# Patient Record
Sex: Female | Born: 1945
Health system: Southern US, Community
[De-identification: ages and names within clinical notes are randomized; demographics above are authoritative.]

## PROBLEM LIST (undated history)

## (undated) DIAGNOSIS — E782 Mixed hyperlipidemia: Secondary | ICD-10-CM

## (undated) DIAGNOSIS — F4321 Adjustment disorder with depressed mood: Principal | ICD-10-CM

## (undated) DIAGNOSIS — G629 Polyneuropathy, unspecified: Secondary | ICD-10-CM

## (undated) DIAGNOSIS — D649 Anemia, unspecified: Secondary | ICD-10-CM

## (undated) DIAGNOSIS — M1990 Unspecified osteoarthritis, unspecified site: Secondary | ICD-10-CM

## (undated) DIAGNOSIS — R591 Generalized enlarged lymph nodes: Secondary | ICD-10-CM

## (undated) DIAGNOSIS — Z0000 Encounter for general adult medical examination without abnormal findings: Secondary | ICD-10-CM

## (undated) DIAGNOSIS — Z8742 Personal history of other diseases of the female genital tract: Secondary | ICD-10-CM

## (undated) DIAGNOSIS — Z973 Presence of spectacles and contact lenses: Secondary | ICD-10-CM

## (undated) DIAGNOSIS — E559 Vitamin D deficiency, unspecified: Secondary | ICD-10-CM

## (undated) DIAGNOSIS — Z8619 Personal history of other infectious and parasitic diseases: Secondary | ICD-10-CM

## (undated) DIAGNOSIS — C8330 Diffuse large B-cell lymphoma, unspecified site: Secondary | ICD-10-CM

## (undated) HISTORY — DX: Personal history of other infectious and parasitic diseases: Z86.19

## (undated) HISTORY — PX: SKIN BIOPSY: SHX1

## (undated) HISTORY — DX: Vitamin D deficiency, unspecified: E55.9

## (undated) HISTORY — DX: Generalized enlarged lymph nodes: R59.1

## (undated) HISTORY — DX: Adjustment disorder with depressed mood: F43.21

## (undated) HISTORY — DX: Anemia, unspecified: D64.9

## (undated) HISTORY — DX: Personal history of other diseases of the female genital tract: Z87.42

## (undated) HISTORY — DX: Diffuse large B-cell lymphoma, unspecified site: C83.30

## (undated) HISTORY — DX: Mixed hyperlipidemia: E78.2

## (undated) HISTORY — DX: Polyneuropathy, unspecified: G62.9

## (undated) HISTORY — DX: Encounter for general adult medical examination without abnormal findings: Z00.00

## (undated) HISTORY — PX: COLONOSCOPY: SHX174

---

## 1998-02-26 ENCOUNTER — Other Ambulatory Visit: Admission: RE | Admit: 1998-02-26 | Discharge: 1998-02-26 | Payer: Self-pay | Admitting: *Deleted

## 1999-03-23 ENCOUNTER — Other Ambulatory Visit: Admission: RE | Admit: 1999-03-23 | Discharge: 1999-03-23 | Payer: Self-pay | Admitting: *Deleted

## 2000-03-30 ENCOUNTER — Other Ambulatory Visit: Admission: RE | Admit: 2000-03-30 | Discharge: 2000-03-30 | Payer: Self-pay | Admitting: *Deleted

## 2001-03-27 ENCOUNTER — Other Ambulatory Visit: Admission: RE | Admit: 2001-03-27 | Discharge: 2001-03-27 | Payer: Self-pay | Admitting: *Deleted

## 2002-04-05 ENCOUNTER — Other Ambulatory Visit: Admission: RE | Admit: 2002-04-05 | Discharge: 2002-04-05 | Payer: Self-pay | Admitting: *Deleted

## 2003-04-08 ENCOUNTER — Other Ambulatory Visit: Admission: RE | Admit: 2003-04-08 | Discharge: 2003-04-08 | Payer: Self-pay | Admitting: *Deleted

## 2004-04-08 ENCOUNTER — Other Ambulatory Visit: Admission: RE | Admit: 2004-04-08 | Discharge: 2004-04-08 | Payer: Self-pay | Admitting: *Deleted

## 2005-01-12 ENCOUNTER — Other Ambulatory Visit: Admission: RE | Admit: 2005-01-12 | Discharge: 2005-01-12 | Payer: Self-pay | Admitting: *Deleted

## 2011-03-15 LAB — HM COLONOSCOPY

## 2011-06-28 DIAGNOSIS — Z1231 Encounter for screening mammogram for malignant neoplasm of breast: Secondary | ICD-10-CM | POA: Diagnosis not present

## 2011-08-06 DIAGNOSIS — G479 Sleep disorder, unspecified: Secondary | ICD-10-CM | POA: Diagnosis not present

## 2011-08-06 DIAGNOSIS — F411 Generalized anxiety disorder: Secondary | ICD-10-CM | POA: Diagnosis not present

## 2011-08-19 DIAGNOSIS — G479 Sleep disorder, unspecified: Secondary | ICD-10-CM | POA: Diagnosis not present

## 2011-08-19 DIAGNOSIS — F411 Generalized anxiety disorder: Secondary | ICD-10-CM | POA: Diagnosis not present

## 2011-09-22 DIAGNOSIS — D239 Other benign neoplasm of skin, unspecified: Secondary | ICD-10-CM | POA: Diagnosis not present

## 2011-09-22 DIAGNOSIS — L280 Lichen simplex chronicus: Secondary | ICD-10-CM | POA: Diagnosis not present

## 2011-12-10 DIAGNOSIS — H40029 Open angle with borderline findings, high risk, unspecified eye: Secondary | ICD-10-CM | POA: Diagnosis not present

## 2011-12-10 DIAGNOSIS — H1045 Other chronic allergic conjunctivitis: Secondary | ICD-10-CM | POA: Diagnosis not present

## 2011-12-10 DIAGNOSIS — H2510 Age-related nuclear cataract, unspecified eye: Secondary | ICD-10-CM | POA: Diagnosis not present

## 2012-01-27 ENCOUNTER — Telehealth: Payer: Self-pay

## 2012-01-27 NOTE — Telephone Encounter (Signed)
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

## 2012-01-27 NOTE — Telephone Encounter (Signed)
DR DOOLITTLE   PT WOULD LIKE YOU TO ACCEPT HER AS A NEW MEDICARE PATIENT, SHE HAS NOT BEEN SEEN IN OUR OFFICE SINCE 2000.    PT PHONE [phone]

## 2012-02-01 NOTE — Telephone Encounter (Signed)
Pt needs CPE (Welcome to [facility]) by October. Scheduled CPE with Dr. Clelia Croft due to time constrants and 6 month f-up with Dr. Merla Riches in April.

## 2012-02-01 NOTE — Telephone Encounter (Signed)
Just FYI. Shakeelah had to have her Welcome to [facility] this month so I scheduled that with Dr. Clelia Croft and also scheduled a 6 month f-up OV with you in April. She wants to see you as her primary but understands we could not get the CPE done in time for this year.

## 2012-02-01 NOTE — Telephone Encounter (Signed)
Pt approved for appts by Dr. Merla Riches. Left msg for pt to call to schedule appt with him.

## 2012-02-15 DIAGNOSIS — J069 Acute upper respiratory infection, unspecified: Secondary | ICD-10-CM | POA: Diagnosis not present

## 2012-02-15 DIAGNOSIS — J029 Acute pharyngitis, unspecified: Secondary | ICD-10-CM | POA: Diagnosis not present

## 2012-02-17 DIAGNOSIS — Z01419 Encounter for gynecological examination (general) (routine) without abnormal findings: Secondary | ICD-10-CM | POA: Diagnosis not present

## 2012-02-17 DIAGNOSIS — Z1151 Encounter for screening for human papillomavirus (HPV): Secondary | ICD-10-CM | POA: Diagnosis not present

## 2012-02-17 DIAGNOSIS — R87610 Atypical squamous cells of undetermined significance on cytologic smear of cervix (ASC-US): Secondary | ICD-10-CM | POA: Diagnosis not present

## 2012-02-17 DIAGNOSIS — Z9189 Other specified personal risk factors, not elsewhere classified: Secondary | ICD-10-CM | POA: Diagnosis not present

## 2012-02-25 ENCOUNTER — Encounter: Payer: Self-pay | Admitting: Family Medicine

## 2012-02-29 DIAGNOSIS — Z23 Encounter for immunization: Secondary | ICD-10-CM | POA: Diagnosis not present

## 2012-03-15 ENCOUNTER — Ambulatory Visit (INDEPENDENT_AMBULATORY_CARE_PROVIDER_SITE_OTHER): Payer: Medicare Other | Admitting: Internal Medicine

## 2012-03-15 ENCOUNTER — Encounter: Payer: Self-pay | Admitting: Internal Medicine

## 2012-03-15 VITALS — BP 119/73 | HR 58 | Temp 98.1°F | Resp 18 | Ht 68.5 in | Wt 170.8 lb

## 2012-03-15 DIAGNOSIS — E7800 Pure hypercholesterolemia, unspecified: Secondary | ICD-10-CM | POA: Diagnosis not present

## 2012-03-15 DIAGNOSIS — Z0000 Encounter for general adult medical examination without abnormal findings: Secondary | ICD-10-CM

## 2012-03-15 DIAGNOSIS — Z13220 Encounter for screening for lipoid disorders: Secondary | ICD-10-CM

## 2012-03-15 LAB — POCT URINALYSIS DIPSTICK
Bilirubin, UA: NEGATIVE
Glucose, UA: NEGATIVE
Ketones, UA: NEGATIVE
Leukocytes, UA: NEGATIVE
Nitrite, UA: NEGATIVE
Protein, UA: NEGATIVE
Spec Grav, UA: 1.02
Urobilinogen, UA: 0.2
pH, UA: 6.5

## 2012-03-15 LAB — LIPID PANEL
Cholesterol: 219 mg/dL — ABNORMAL HIGH (ref 0–200)
HDL: 71 mg/dL (ref >39)
LDL Cholesterol: 133 mg/dL — ABNORMAL HIGH (ref 0–99)
Total CHOL/HDL Ratio: 3.1 Ratio
Triglycerides: 74 mg/dL (ref <150)
VLDL: 15 mg/dL (ref 0–40)

## 2012-03-15 LAB — COMPREHENSIVE METABOLIC PANEL
ALT: 19 U/L (ref 0–35)
AST: 17 U/L (ref 0–37)
Albumin: 4.5 g/dL (ref 3.5–5.2)
Alkaline Phosphatase: 80 U/L (ref 39–117)
BUN: 14 mg/dL (ref 6–23)
CO2: 28 mEq/L (ref 19–32)
Calcium: 9.6 mg/dL (ref 8.4–10.5)
Chloride: 105 mEq/L (ref 96–112)
Creat: 0.7 mg/dL (ref 0.50–1.10)
Glucose, Bld: 93 mg/dL (ref 70–99)
Potassium: 4.4 mEq/L (ref 3.5–5.3)
Sodium: 139 mEq/L (ref 135–145)
Total Bilirubin: 0.6 mg/dL (ref 0.3–1.2)
Total Protein: 6.8 g/dL (ref 6.0–8.3)

## 2012-03-15 NOTE — Progress Notes (Signed)
  Subjective:    [name], female    DOB: 06-25-1945, 66 y.o.   MRN: [id number]  HPIWelcome to Medicare Long history of good health with no problems and extreme longevity and family. Most pressing problems involve living with spouse diagnosed in 2010 with glioblastoma multiforme Not treated in a protocol study at [facility] and has far exceeded his expected lifetime. He does have a number of disabling features and requires a considerable amount of care, the bulk of which falls to our patient. This creates a lot of anxiety in her head also considerable guilt and she can accomplish all that he needs. This is resulting in a great cutback in work and personal time for her which also takes an emotional toll.  Review of medical and social history Reveals no chronic illnesses, no prior surgeries, no significant family medical history, no significant past medical history.No personal risk behaviors. Good daily activity. Works as a Management consultant. Is up-to-date on Pap smear and mammogram through [facility]. Is up-to-date on colonoscopy as of November 2012 with Dr. Cristina Gong.  She completed a screening inventory for depression which was negative. She has no other mood disorders. Sleep is good. Her functional ability and level of safety is excellent. She is not ready to commit to end-of-life planning  Flu shot one month ago. Pneumococcal vaccine one year ago. Review of Systems 14 point review of systems negative. She is menopausal.   Last Pap smear was one month ago and included fecal occult blood screening. Last DEXA 2010 was normal. Recent weight gain of 10 pounds due to sharing cooking desserts and needing a desserts with spouse Objective:   Physical Exam Filed Vitals:   03/15/12 0950  BP: 119/73  Pulse: 58  Temp: 98.1 F (36.7 C)  Resp: 18  wt 170 No thyromegaly or lymphadenopathy vision intact Able to read small print at regular distance with some blurriness of far  vision/she has glasses in her car Heart regular without murmurs rubs or gallops/no carotid bruits Neurological is intact Psychological is stable        Assessment & Plan:  Discussed weight loss through dietary modification and exercise increases There was one elevated LDL value in 2007 which will be repeated today  She  Is in excellent health/she will consider counseling for dealing with her difficult role of the caretaker Followup 1 year

## 2012-03-16 LAB — CBC WITH DIFFERENTIAL/PLATELET
Basophils Absolute: 0 10*3/uL (ref 0.0–0.1)
Basophils Relative: 1 % (ref 0–1)
Eosinophils Absolute: 0.1 10*3/uL (ref 0.0–0.7)
Eosinophils Relative: 4 % (ref 0–5)
HCT: 40.1 % (ref 36.0–46.0)
Hemoglobin: 14 g/dL (ref 12.0–15.0)
Lymphocytes Relative: 35 % (ref 12–46)
Lymphs Abs: 1.4 10*3/uL (ref 0.7–4.0)
MCH: 30.2 pg (ref 26.0–34.0)
MCHC: 34.9 g/dL (ref 30.0–36.0)
MCV: 86.4 fL (ref 78.0–100.0)
Monocytes Absolute: 0.4 10*3/uL (ref 0.1–1.0)
Monocytes Relative: 9 % (ref 3–12)
Neutro Abs: 2.1 10*3/uL (ref 1.7–7.7)
Neutrophils Relative %: 51 % (ref 43–77)
Platelets: 287 10*3/uL (ref 150–400)
RBC: 4.64 MIL/uL (ref 3.87–5.11)
RDW: 13.7 % (ref 11.5–15.5)
WBC: 4 10*3/uL (ref 4.0–10.5)

## 2012-03-20 ENCOUNTER — Encounter: Payer: Self-pay | Admitting: Internal Medicine

## 2012-07-17 DIAGNOSIS — Z1231 Encounter for screening mammogram for malignant neoplasm of breast: Secondary | ICD-10-CM | POA: Diagnosis not present

## 2012-08-16 ENCOUNTER — Encounter: Payer: Self-pay | Admitting: Internal Medicine

## 2012-08-16 ENCOUNTER — Ambulatory Visit (INDEPENDENT_AMBULATORY_CARE_PROVIDER_SITE_OTHER): Payer: Medicare Other | Admitting: Internal Medicine

## 2012-08-16 VITALS — BP 130/90 | HR 61 | Temp 98.4°F | Resp 16 | Ht 67.75 in | Wt 173.6 lb

## 2012-08-16 DIAGNOSIS — L989 Disorder of the skin and subcutaneous tissue, unspecified: Secondary | ICD-10-CM | POA: Diagnosis not present

## 2012-08-16 DIAGNOSIS — R079 Chest pain, unspecified: Secondary | ICD-10-CM | POA: Diagnosis not present

## 2012-08-16 DIAGNOSIS — R002 Palpitations: Secondary | ICD-10-CM | POA: Diagnosis not present

## 2012-08-16 MED ORDER — ALPRAZOLAM 0.25 MG PO TABS: ORAL_TABLET | ORAL | Status: DC

## 2012-08-17 NOTE — Progress Notes (Signed)
  Subjective:    [name], female    DOB: February 15, 1946, 67 y.o.   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 This has resulted in disrupted sleep and great anxiety over the chaos and emergencies which occur In the past week she has had episodes of waking at night with chest pain and palpitations with mild shortness of breath//no diaphoresis nausea or vomiting She exercises strenuously during the day without any cardiac symptoms No past history of cardiac problems  Also concerned about a sore area on the back of the neck at the hairline and which has been present for several months without healing/feels like it scabs over  Review of Systems     Objective:   Physical Exam BP 130/90  Pulse 61  Temp(Src) 98.4 F (36.9 C) (Oral)  Resp 16  Ht 5' 7.75" (1.721 m)  Wt 173 lb 9.6 oz (78.744 kg)  BMI 26.59 kg/m2  SpO2 96% HEENT clear with no thyromegaly Heart regular without murmurs rubs click or gallop Lungs clear No peripheral edema Mood stable/affect appropriate/thought content excellent In the hairline on the right posterior neck there is a 0.5 cm irregular lesion with a slightly crusty surface  EKG within normal limits showing 2 atrial origins for pacing     Assessment & Plan:  Problem #1 chest pain and palpitations secondary to anxiety which is appropriate given her situation She may use Xanax for panic attacks if desired//followup 3 months-sooner if increased symptoms We discussed at length the role of the caretaker and his great difficulties and the need to continue to pass along responsibilities to other medical assistant agencies  Problem #2 skin lesion? Basal cell-to Dr. Danella Deis

## 2012-09-15 DIAGNOSIS — T1510XA Foreign body in conjunctival sac, unspecified eye, initial encounter: Secondary | ICD-10-CM | POA: Diagnosis not present

## 2013-04-04 DIAGNOSIS — L570 Actinic keratosis: Secondary | ICD-10-CM | POA: Diagnosis not present

## 2013-09-09 ENCOUNTER — Ambulatory Visit (INDEPENDENT_AMBULATORY_CARE_PROVIDER_SITE_OTHER): Payer: Medicare Other | Admitting: Family Medicine

## 2013-09-09 VITALS — BP 122/68 | HR 52 | Temp 98.1°F | Resp 14 | Ht 69.5 in | Wt 180.0 lb

## 2013-09-09 DIAGNOSIS — L255 Unspecified contact dermatitis due to plants, except food: Secondary | ICD-10-CM

## 2013-09-09 DIAGNOSIS — H00019 Hordeolum externum unspecified eye, unspecified eyelid: Secondary | ICD-10-CM

## 2013-09-09 DIAGNOSIS — L237 Allergic contact dermatitis due to plants, except food: Secondary | ICD-10-CM

## 2013-09-09 MED ORDER — PREDNISONE 10 MG PO TABS: ORAL_TABLET | ORAL | Status: DC

## 2013-09-09 MED ORDER — TRIAMCINOLONE ACETONIDE 0.1 % EX CREA: 1.0000 "application " | TOPICAL_CREAM | Freq: Three times a day (TID) | CUTANEOUS | Status: DC

## 2013-09-09 MED ORDER — BACITRACIN-POLYMYXIN B 500-10000 UNIT/GM OP OINT: 1.0000 "application " | TOPICAL_OINTMENT | Freq: Four times a day (QID) | OPHTHALMIC | Status: DC

## 2013-09-09 NOTE — Progress Notes (Signed)
Subjective:    [name], female    DOB: [birth year], 68 y.o.   MRN: [id number] This chart was scribed for Laurey Arrow. Brigitte Pulse, MD by Terressa Koyanagi, ED Scribe. This patient was seen in room 12 and the patient's care was started at 8:23 AM.    HPI  HPI Comments: Lindsay Harrison is a 68 y.o. female who presents to the [facility]   First Complaint: Pt reports she was exposed to poison ivy as she was working on her yard 8 days ago. Pt reports she initially developed a large rash on her right forearm but is now noticing new lesions that continued to spread to her left arm. Pt reports she has tried over the counter meds of cortisone and tecnu for the past couple of days without much relief. Pt reports she has never tried oral steroids in the past.    Second Complaint: Pt complains of a left eye stye onset 2 days ago. Pt reports she rides her bike to work everyday and believes something may have gotten in her eye. Pt reports using warm compresses on her eye. No pain or change in her eyeball - just her left lower inner lid is red and swollen, no drainage.  Personal Information: Pt reports that her husband has glioblastoma.   Review of Systems  Constitutional: Negative for fever, chills, diaphoresis, activity change and appetite change.  Eyes: Positive for pain and itching. Negative for photophobia, discharge, redness and visual disturbance.       Stye in left eye  Musculoskeletal: Negative for arthralgias and joint swelling.  Skin: Positive for color change and rash (large red rash lower right arm and papules on left arm ). Negative for pallor and wound.  Hematological: Negative for adenopathy. Does not bruise/bleed easily.       Objective:   Physical Exam  Nursing note and vitals reviewed. Constitutional: She is oriented to person, place, and time. She appears well-developed and well-nourished. No distress.  HENT:  Head: Normocephalic and atraumatic.  Eyes:  Conjunctivae and EOM are normal. Pupils are equal, round, and reactive to light. Right eye exhibits no discharge. Left eye exhibits hordeolum. Left eye exhibits no chemosis, no discharge and no exudate.  Neck: Neck supple. No tracheal deviation present.  Cardiovascular: Normal rate.   Pulmonary/Chest: Effort normal. No respiratory distress.  Musculoskeletal: Normal range of motion.  Neurological: She is alert and oriented to person, place, and time.  Skin: Skin is warm and dry. Rash noted.  Right forearm with large 3-4 inch diameter clear vesicles on erythematous base. Pinpoint erythematous papules and vesicles some in linear formation spread over left arm.   Psychiatric: She has a normal mood and affect. Her behavior is normal.   BP 122/68  Pulse 52  Temp(Src) 98.1 F (36.7 C) (Oral)  Resp 14  Ht 5' 9.5" (1.765 m)  Wt 180 lb (81.647 kg)  BMI 26.21 kg/m2  SpO2 97%   Assessment & Plan:   8:26 AM-Discussed treatment plan  and pt agreed to plan. Pt advised to return to clinic if symptoms do not improve or worsen.  Poison ivy dermatitis - pt prefers to start oral steroid taper - then can use topical TAC for any persisting rash after taper complete  Hordeolum externum (stye) - warm compresses qid followed by top abx.  Meds ordered this encounter  Medications  . predniSONE (DELTASONE) 10 MG tablet    Sig: 6-5-4-3-2-1 tabs po qd in taper  x 6d    Dispense:  21 tablet    Refill:  0  . triamcinolone cream (KENALOG) 0.1 %    Sig: Apply 1 application topically 3 (three) times daily.    Dispense:  85.2 g    Refill:  1  . bacitracin-polymyxin b (POLYSPORIN) ophthalmic ointment    Sig: Place 1 application into the left eye 4 (four) times daily.    Dispense:  3.5 g    Refill:  0    I personally performed the services described in this documentation, which was scribed in my presence. The recorded information has been reviewed and considered, and addended by me as needed.  Delman Cheadle, MD  MP[facility]

## 2013-09-09 NOTE — Patient Instructions (Signed)
Continue wet warm compress to your left eye four times a day and apply the topical antibiotic gel to lid after. Apply ice to the poison ivy.  After the oral steroids are complete, you can start using the topical triamcinolone cream until all lesions are completely resolved.  Poison [employer] ivy is a inflammation of the skin (contact dermatitis) caused by touching the allergens on the leaves of the ivy plant following previous exposure to the plant. The rash usually appears 48 hours after exposure. The rash is usually bumps (papules) or blisters (vesicles) in a linear pattern. Depending on your own sensitivity, the rash may simply cause redness and itching, or it may also progress to blisters which may break open. These must be well cared for to prevent secondary bacterial (germ) infection, followed by scarring. Keep any open areas dry, clean, dressed, and covered with an antibacterial ointment if needed. The eyes may also get puffy. The puffiness is worst in the morning and gets better as the day progresses. This dermatitis usually heals without scarring, within 2 to 3 weeks without treatment. HOME CARE INSTRUCTIONS  Thoroughly wash with soap and water as soon as you have been exposed to poison ivy. You have about one half hour to remove the plant resin before it will cause the rash. This washing will destroy the oil or antigen on the skin that is causing, or will cause, the rash. Be sure to wash under your fingernails as any plant resin there will continue to spread the rash. Do not rub skin vigorously when washing affected area. Poison ivy cannot spread if no oil from the plant remains on your body. A rash that has progressed to weeping sores will not spread the rash unless you have not washed thoroughly. It is also important to wash any clothes you have been wearing as these may carry active allergens. The rash will return if you wear the unwashed clothing, even several days later. Avoidance of the plant  in the future is the best measure. Poison ivy plant can be recognized by the number of leaves. Generally, poison ivy has three leaves with flowering branches on a single stem. Diphenhydramine may be purchased over the counter and used as needed for itching. Do not drive with this medication if it makes you drowsy.Ask your caregiver about medication for children. SEEK MEDICAL CARE IF:  Open sores develop.  Redness spreads beyond area of rash.  You notice purulent (pus-like) discharge.  You have increased pain.  Other signs of infection develop (such as fever). Document Released: 04/16/2000 Document Revised: 07/12/2011 Document Reviewed: 03/05/2009 [facility] Patient Information 2014 [place], [place].

## 2013-10-17 DIAGNOSIS — L821 Other seborrheic keratosis: Secondary | ICD-10-CM | POA: Diagnosis not present

## 2013-10-17 DIAGNOSIS — L570 Actinic keratosis: Secondary | ICD-10-CM | POA: Diagnosis not present

## 2013-10-17 DIAGNOSIS — Z808 Family history of malignant neoplasm of other organs or systems: Secondary | ICD-10-CM | POA: Diagnosis not present

## 2013-10-17 DIAGNOSIS — D239 Other benign neoplasm of skin, unspecified: Secondary | ICD-10-CM | POA: Diagnosis not present

## 2013-12-13 DIAGNOSIS — H43399 Other vitreous opacities, unspecified eye: Secondary | ICD-10-CM | POA: Diagnosis not present

## 2013-12-13 DIAGNOSIS — H40019 Open angle with borderline findings, low risk, unspecified eye: Secondary | ICD-10-CM | POA: Diagnosis not present

## 2013-12-13 DIAGNOSIS — H11129 Conjunctival concretions, unspecified eye: Secondary | ICD-10-CM | POA: Diagnosis not present

## 2014-01-16 ENCOUNTER — Encounter: Payer: Self-pay | Admitting: Internal Medicine

## 2014-01-16 ENCOUNTER — Ambulatory Visit (INDEPENDENT_AMBULATORY_CARE_PROVIDER_SITE_OTHER): Payer: Medicare Other | Admitting: Internal Medicine

## 2014-01-16 VITALS — BP 140/80 | HR 62 | Temp 98.6°F | Resp 16 | Ht 67.5 in | Wt 174.8 lb

## 2014-01-16 DIAGNOSIS — Z1159 Encounter for screening for other viral diseases: Secondary | ICD-10-CM | POA: Diagnosis not present

## 2014-01-16 DIAGNOSIS — Z0000 Encounter for general adult medical examination without abnormal findings: Secondary | ICD-10-CM

## 2014-01-16 DIAGNOSIS — E785 Hyperlipidemia, unspecified: Secondary | ICD-10-CM | POA: Diagnosis not present

## 2014-01-16 DIAGNOSIS — Z23 Encounter for immunization: Secondary | ICD-10-CM | POA: Diagnosis not present

## 2014-01-16 DIAGNOSIS — R002 Palpitations: Secondary | ICD-10-CM | POA: Diagnosis not present

## 2014-01-16 LAB — POCT URINALYSIS DIPSTICK
Bilirubin, UA: NEGATIVE
Blood, UA: NEGATIVE
Glucose, UA: NEGATIVE
Ketones, UA: NEGATIVE
Leukocytes, UA: NEGATIVE
Nitrite, UA: NEGATIVE
Protein, UA: NEGATIVE
Spec Grav, UA: 1.01
Urobilinogen, UA: 0.2
pH, UA: 7

## 2014-01-16 MED ORDER — ALPRAZOLAM 0.25 MG PO TABS: ORAL_TABLET | ORAL | Status: DC

## 2014-01-16 NOTE — Progress Notes (Signed)
Subjective:    [name], female    DOB: 1945-12-21, 68 y.o.   MRN: [id number]  HPIf/u CPE- Doing well See last ov Still w/ caretaker stresses   no chronic meds/rarely uses on anxiety meds except can't sleep once or twice a month  GYN stable with primary care GYN caregiver Immunizations up to date Health maintenance issues up-to-date except Prevnar  Review of Systems  Constitutional: Negative.   HENT: Negative.   Eyes: Positive for itching.  Respiratory: Negative.   Cardiovascular: Negative.        Occasional palpitations related to her anxiety over issues with regarding her spouse  Gastrointestinal: Negative.   Endocrine: Negative.   Genitourinary: Negative.   Musculoskeletal: Positive for back pain.  Skin: Negative.   Allergic/Immunologic: Negative.   Neurological: Positive for numbness.       Fleeting paresthesias in lower extrem w/ some lumbar discomfort with activity related to lifting invalid spouse as he falls  Hematological: Negative.   Psychiatric/Behavioral: Positive for sleep disturbance and dysphoric mood.   As in past due to terminal illness of spouse     Objective:   Physical Exam  Nursing note and vitals reviewed. Constitutional: She is oriented to person, place, and time. She appears well-developed and well-nourished. No distress.  HENT:  Head: Normocephalic.  Right Ear: External ear normal.  Left Ear: External ear normal.  Nose: Nose normal.  Mouth/Throat: Oropharynx is clear and moist.  Eyes: Conjunctivae and EOM are normal. Pupils are equal, round, and reactive to light.  Neck: Normal range of motion. Neck supple. No thyromegaly present.  Cardiovascular: Normal rate, regular rhythm, normal heart sounds and intact distal pulses.   No murmur heard. Pulmonary/Chest: Effort normal and breath sounds normal. She has no wheezes. Right breast exhibits no inverted nipple, no mass, no nipple discharge, no skin change and no tenderness.  Left breast exhibits no inverted nipple, no mass, no nipple discharge, no skin change and no tenderness. Breasts are symmetrical.  Abdominal: Soft. Bowel sounds are normal. She exhibits no distension and no mass. There is no tenderness. There is no rebound.  Musculoskeletal: Normal range of motion. She exhibits no edema and no tenderness.  Lymphadenopathy:    She has no cervical adenopathy.  Neurological: She is alert and oriented to person, place, and time. She has normal reflexes. No cranial nerve deficit.  Skin: Skin is warm and dry. No rash noted.  Psychiatric: She has a normal mood and affect. Her behavior is normal. Judgment and thought content normal.  BP 140/80  Pulse 62  Temp(Src) 98.6 F (37 C) (Oral)  Resp 16  Ht 5' 7.5" (1.715 m)  Wt 174 lb 12.8 oz (79.289 kg)  BMI 26.96 kg/m2  SpO2 98%      Assessment & Plan:  Need for prophylactic vaccination and inoculation against influenza - Plan: Flu Vaccine QUAD 36+ mos IM  Palpitations - Plan: ALPRAZolam (XANAX) 0.25 MG tablet, CBC with Differential, Comprehensive metabolic panel, TSH, POCT urinalysis dipstick  Need for hepatitis C screening test - Plan: Hepatitis C antibody  Other and unspecified hyperlipidemia - Plan: Lipid panel, POCT urinalysis dipstick  Need for prophylactic vaccination against Streptococcus pneumoniae (pneumococcus) - Plan: Pneumococcal conjugate vaccine 13-valent IM  Meds ordered this encounter  Medications  . ALPRAZolam (XANAX) 0.25 MG tablet    Sig: 1-2 at onset palpitations    Dispense:  20 tablet    Refill:  2   [facility]-labs Results for orders placed in  visit on 01/16/14  CBC WITH DIFFERENTIAL      Result Value Ref Range   WBC 4.4  4.0 - 10.5 K/uL   RBC 4.50  3.87 - 5.11 MIL/uL   Hemoglobin 13.6  12.0 - 15.0 g/dL   HCT 40.1  36.0 - 46.0 %   MCV 89.1  78.0 - 100.0 fL   MCH 30.2  26.0 - 34.0 pg   MCHC 33.9  30.0 - 36.0 g/dL   RDW 13.8  11.5 - 15.5 %   Platelets 294  150 - 400 K/uL    Neutrophils Relative % 49  43 - 77 %   Neutro Abs 2.2  1.7 - 7.7 K/uL   Lymphocytes Relative 36  12 - 46 %   Lymphs Abs 1.6  0.7 - 4.0 K/uL   Monocytes Relative 10  3 - 12 %   Monocytes Absolute 0.4  0.1 - 1.0 K/uL   Eosinophils Relative 5  0 - 5 %   Eosinophils Absolute 0.2  0.0 - 0.7 K/uL   Basophils Relative 0  0 - 1 %   Basophils Absolute 0.0  0.0 - 0.1 K/uL   Smear Review Criteria for review not met    COMPREHENSIVE METABOLIC PANEL      Result Value Ref Range   Sodium 139  135 - 145 mEq/L   Potassium 4.7  3.5 - 5.3 mEq/L   Chloride 103  96 - 112 mEq/L   CO2 28  19 - 32 mEq/L   Glucose, Bld 85  70 - 99 mg/dL   BUN 13  6 - 23 mg/dL   Creat 0.71  0.50 - 1.10 mg/dL   Total Bilirubin 0.7  0.2 - 1.2 mg/dL   Alkaline Phosphatase 102  39 - 117 U/L   AST 16  0 - 37 U/L   ALT 17  0 - 35 U/L   Total Protein 6.9  6.0 - 8.3 g/dL   Albumin 4.4  3.5 - 5.2 g/dL   Calcium 9.7  8.4 - 10.5 mg/dL  HEPATITIS C ANTIBODY      Result Value Ref Range   HCV Ab NEGATIVE  NEGATIVE  LIPID PANEL      Result Value Ref Range   Cholesterol 217 (*) 0 - 200 mg/dL   Triglycerides 57  <150 mg/dL   HDL 76  >39 mg/dL   Total CHOL/HDL Ratio 2.9     VLDL 11  0 - 40 mg/dL   LDL Cholesterol 130 (*) 0 - 99 mg/dL  TSH      Result Value Ref Range   TSH 2.070  0.350 - 4.500 uIU/mL  POCT URINALYSIS DIPSTICK      Result Value Ref Range   Color, UA yellow     Clarity, UA clear     Glucose, UA neg     Bilirubin, UA neg     Ketones, UA neg     Spec Grav, UA 1.010     Blood, UA neg     pH, UA 7.0     Protein, UA neg     Urobilinogen, UA 0.2     Nitrite, UA neg     Leukocytes, UA Negative

## 2014-01-17 LAB — LIPID PANEL
Cholesterol: 217 mg/dL — ABNORMAL HIGH (ref 0–200)
HDL: 76 mg/dL (ref >39)
LDL Cholesterol: 130 mg/dL — ABNORMAL HIGH (ref 0–99)
Total CHOL/HDL Ratio: 2.9 Ratio
Triglycerides: 57 mg/dL (ref <150)
VLDL: 11 mg/dL (ref 0–40)

## 2014-01-17 LAB — COMPREHENSIVE METABOLIC PANEL
ALT: 17 U/L (ref 0–35)
AST: 16 U/L (ref 0–37)
Albumin: 4.4 g/dL (ref 3.5–5.2)
Alkaline Phosphatase: 102 U/L (ref 39–117)
BUN: 13 mg/dL (ref 6–23)
CO2: 28 mEq/L (ref 19–32)
Calcium: 9.7 mg/dL (ref 8.4–10.5)
Chloride: 103 mEq/L (ref 96–112)
Creat: 0.71 mg/dL (ref 0.50–1.10)
Glucose, Bld: 85 mg/dL (ref 70–99)
Potassium: 4.7 mEq/L (ref 3.5–5.3)
Sodium: 139 mEq/L (ref 135–145)
Total Bilirubin: 0.7 mg/dL (ref 0.2–1.2)
Total Protein: 6.9 g/dL (ref 6.0–8.3)

## 2014-01-17 LAB — CBC WITH DIFFERENTIAL/PLATELET
Basophils Absolute: 0 10*3/uL (ref 0.0–0.1)
Basophils Relative: 0 % (ref 0–1)
Eosinophils Absolute: 0.2 10*3/uL (ref 0.0–0.7)
Eosinophils Relative: 5 % (ref 0–5)
HCT: 40.1 % (ref 36.0–46.0)
Hemoglobin: 13.6 g/dL (ref 12.0–15.0)
Lymphocytes Relative: 36 % (ref 12–46)
Lymphs Abs: 1.6 10*3/uL (ref 0.7–4.0)
MCH: 30.2 pg (ref 26.0–34.0)
MCHC: 33.9 g/dL (ref 30.0–36.0)
MCV: 89.1 fL (ref 78.0–100.0)
Monocytes Absolute: 0.4 10*3/uL (ref 0.1–1.0)
Monocytes Relative: 10 % (ref 3–12)
Neutro Abs: 2.2 10*3/uL (ref 1.7–7.7)
Neutrophils Relative %: 49 % (ref 43–77)
Platelets: 294 10*3/uL (ref 150–400)
RBC: 4.5 MIL/uL (ref 3.87–5.11)
RDW: 13.8 % (ref 11.5–15.5)
WBC: 4.4 10*3/uL (ref 4.0–10.5)

## 2014-01-17 LAB — TSH: TSH: 2.07 u[IU]/mL (ref 0.350–4.500)

## 2014-01-17 LAB — HEPATITIS C ANTIBODY: HCV Ab: NEGATIVE

## 2014-01-21 ENCOUNTER — Encounter: Payer: Self-pay | Admitting: Internal Medicine

## 2014-06-28 DIAGNOSIS — Z1231 Encounter for screening mammogram for malignant neoplasm of breast: Secondary | ICD-10-CM | POA: Diagnosis not present

## 2014-07-25 ENCOUNTER — Encounter: Payer: Self-pay | Admitting: Internal Medicine

## 2014-11-13 DIAGNOSIS — D225 Melanocytic nevi of trunk: Secondary | ICD-10-CM | POA: Diagnosis not present

## 2014-11-13 DIAGNOSIS — L719 Rosacea, unspecified: Secondary | ICD-10-CM | POA: Diagnosis not present

## 2014-11-13 DIAGNOSIS — L570 Actinic keratosis: Secondary | ICD-10-CM | POA: Diagnosis not present

## 2014-11-13 DIAGNOSIS — L821 Other seborrheic keratosis: Secondary | ICD-10-CM | POA: Diagnosis not present

## 2014-11-13 DIAGNOSIS — Z808 Family history of malignant neoplasm of other organs or systems: Secondary | ICD-10-CM | POA: Diagnosis not present

## 2014-11-13 DIAGNOSIS — Z86018 Personal history of other benign neoplasm: Secondary | ICD-10-CM | POA: Diagnosis not present

## 2015-02-05 ENCOUNTER — Encounter: Payer: Self-pay | Admitting: Internal Medicine

## 2015-02-05 ENCOUNTER — Ambulatory Visit (INDEPENDENT_AMBULATORY_CARE_PROVIDER_SITE_OTHER): Payer: Medicare Other | Admitting: Internal Medicine

## 2015-02-05 VITALS — BP 133/77 | HR 57 | Temp 98.7°F | Resp 16 | Ht 67.8 in | Wt 176.2 lb

## 2015-02-05 DIAGNOSIS — E7800 Pure hypercholesterolemia, unspecified: Secondary | ICD-10-CM | POA: Diagnosis not present

## 2015-02-05 DIAGNOSIS — R002 Palpitations: Secondary | ICD-10-CM

## 2015-02-05 DIAGNOSIS — Z0000 Encounter for general adult medical examination without abnormal findings: Secondary | ICD-10-CM | POA: Diagnosis not present

## 2015-02-05 DIAGNOSIS — E785 Hyperlipidemia, unspecified: Secondary | ICD-10-CM

## 2015-02-05 DIAGNOSIS — Z780 Asymptomatic menopausal state: Secondary | ICD-10-CM | POA: Diagnosis not present

## 2015-02-05 DIAGNOSIS — Z23 Encounter for immunization: Secondary | ICD-10-CM | POA: Diagnosis not present

## 2015-02-05 LAB — CBC WITH DIFFERENTIAL/PLATELET
Basophils Absolute: 0 10*3/uL (ref 0.0–0.1)
Basophils Relative: 1 % (ref 0–1)
Eosinophils Absolute: 0.2 10*3/uL (ref 0.0–0.7)
Eosinophils Relative: 4 % (ref 0–5)
HCT: 42.9 % (ref 36.0–46.0)
Hemoglobin: 14.3 g/dL (ref 12.0–15.0)
Lymphocytes Relative: 42 % (ref 12–46)
Lymphs Abs: 1.9 10*3/uL (ref 0.7–4.0)
MCH: 30.4 pg (ref 26.0–34.0)
MCHC: 33.3 g/dL (ref 30.0–36.0)
MCV: 91.1 fL (ref 78.0–100.0)
MPV: 9.4 fL (ref 8.6–12.4)
Monocytes Absolute: 0.4 10*3/uL (ref 0.1–1.0)
Monocytes Relative: 8 % (ref 3–12)
Neutro Abs: 2.1 10*3/uL (ref 1.7–7.7)
Neutrophils Relative %: 45 % (ref 43–77)
Platelets: 287 10*3/uL (ref 150–400)
RBC: 4.71 MIL/uL (ref 3.87–5.11)
RDW: 13.8 % (ref 11.5–15.5)
WBC: 4.6 10*3/uL (ref 4.0–10.5)

## 2015-02-05 LAB — POCT URINALYSIS DIPSTICK
Bilirubin, UA: NEGATIVE
Blood, UA: NEGATIVE
Glucose, UA: NEGATIVE
Ketones, UA: NEGATIVE
Leukocytes, UA: NEGATIVE
Nitrite, UA: NEGATIVE
Protein, UA: NEGATIVE
Spec Grav, UA: 1.02
Urobilinogen, UA: 0.2
pH, UA: 7.5

## 2015-02-05 LAB — COMPREHENSIVE METABOLIC PANEL
ALT: 20 U/L (ref 6–29)
AST: 17 U/L (ref 10–35)
Albumin: 4.4 g/dL (ref 3.6–5.1)
Alkaline Phosphatase: 86 U/L (ref 33–130)
BUN: 15 mg/dL (ref 7–25)
CO2: 27 mmol/L (ref 20–31)
Calcium: 9.1 mg/dL (ref 8.6–10.4)
Chloride: 102 mmol/L (ref 98–110)
Creat: 0.73 mg/dL (ref 0.50–0.99)
Glucose, Bld: 93 mg/dL (ref 65–99)
Potassium: 4.6 mmol/L (ref 3.5–5.3)
Sodium: 140 mmol/L (ref 135–146)
Total Bilirubin: 0.7 mg/dL (ref 0.2–1.2)
Total Protein: 6.5 g/dL (ref 6.1–8.1)

## 2015-02-05 LAB — LIPID PANEL
Cholesterol: 237 mg/dL — ABNORMAL HIGH (ref 125–200)
HDL: 80 mg/dL (ref >=46)
LDL Cholesterol: 141 mg/dL — ABNORMAL HIGH (ref <130)
Total CHOL/HDL Ratio: 3 Ratio (ref <=5.0)
Triglycerides: 78 mg/dL (ref <150)
VLDL: 16 mg/dL (ref <30)

## 2015-02-05 MED ORDER — TRIAMCINOLONE ACETONIDE 0.1 % EX CREA: 1.0000 "application " | TOPICAL_CREAM | Freq: Three times a day (TID) | CUTANEOUS | Status: DC

## 2015-02-05 MED ORDER — ALPRAZOLAM 0.25 MG PO TABS: ORAL_TABLET | ORAL | Status: DC

## 2015-02-05 NOTE — Progress Notes (Signed)
Subjective:    [name], female    DOB: [birth year], 69 y.o.   MRN: [id number]  HPIannual exam -reviewed last yr//lots of chg/partner now in full care facility//still has anx re being caretaker resp for all, but better in gen w/ more of a life and less anxiety--rare need for alpraz now  Past LDL 130s Healthy otherwise Bikes 4d/w and yoga 4d/wk wy stable Diet fair  Works Warehouse manager as Architect w/ that  History reviewed. No pertinent past surgical history. History reviewed. No pertinent past medical history. Family History  Problem Relation Age of Onset  . Arthritis Mother   . Heart disease Father   . COPD Sister     Emphysema  . Stroke Maternal Aunt    Social History   Social History  . Marital Status: Married    Spouse Name: N/A  . Number of Children: N/A  . Years of Education: N/A   Occupational History  . Psychotherapist    Social History Main Topics  . Smoking status: Never Smoker   . Smokeless tobacco: Never Used  . Alcohol Use: 1.8 - 3.6 oz/week    3-6 Standard drinks or equivalent per week     Comment: 5 glasses of wine a week.  . Drug Use: No  . Sexual Activity:    Partners: Male    Birth Control/ Protection: None   Other Topics Concern  . None   Social History Narrative   Married. Education: [employer]. Exercise: walking, yoga, and bicycling daily for 1-2 hours.      Review of Systems 14pt neg per form    Objective:   Physical Exam  Constitutional: She is oriented to person, place, and time. She appears well-developed and well-nourished. No distress.  HENT:  Head: Normocephalic.  Right Ear: External ear normal.  Left Ear: External ear normal.  Nose: Nose normal.  Mouth/Throat: Oropharynx is clear and moist.  Eyes: Conjunctivae and EOM are normal. Pupils are equal, round, and reactive to light.  Neck: Normal range of motion. Neck supple. No thyromegaly present.  Cardiovascular: Normal rate, regular rhythm,  normal heart sounds and intact distal pulses.   No murmur heard. Pulmonary/Chest: Effort normal and breath sounds normal. She has no wheezes. Right breast exhibits no inverted nipple, no mass, no nipple discharge, no skin change and no tenderness. Left breast exhibits no inverted nipple, no mass, no nipple discharge, no skin change and no tenderness. Breasts are symmetrical.  Abdominal: Soft. Bowel sounds are normal. She exhibits no distension and no mass. There is no tenderness. There is no rebound.  Musculoskeletal: Normal range of motion. She exhibits no edema or tenderness.  Lymphadenopathy:    She has no cervical adenopathy.  Neurological: She is alert and oriented to person, place, and time. She has normal reflexes. No cranial nerve deficit.  Skin: Skin is warm and dry.  hyperpig area R wrist ? Healing of contact derm  Psychiatric: She has a normal mood and affect. Her speech is normal and behavior is normal. Judgment and thought content normal. Cognition and memory are normal.  Nursing note and vitals reviewed. BP 133/77 mmHg  Pulse 57  Temp(Src) 98.7 F (37.1 C) (Oral)  Resp 16  Ht 5' 7.8" (1.722 m)  Wt 176 lb 3.2 oz (79.924 kg)  BMI 26.95 kg/m2     Assessment & Plan:   Influenza vaccine needed - Plan: Flu Vaccine QUAD 36+ mos IM  Postmenopausal - Plan: DG Bone Density  Palpitations/appropriate anx -  Plan: ALPRAZolam (XANAX) 0.25 MG tablet, CBC with Differential/Platelet  #1) Annual physical exam - Plan: CBC with Differential/Platelet, Comprehensive metabolic panel, Lipid panel, POCT urinalysis dipstick  Elevated LDL cholesterol level - Plan: Lipid panel  Contact derm  Meds ordered this encounter  Medications  . triamcinolone cream (KENALOG) 0.1 %    Sig: Apply 1 application topically 3 (three) times daily.    Dispense:  85.2 g    Refill:  1  . ALPRAZolam (XANAX) 0.25 MG tablet    Sig: 1-2 at onset palpitations    Dispense:  20 tablet    Refill:  2

## 2015-03-31 ENCOUNTER — Encounter: Payer: Self-pay | Admitting: Internal Medicine

## 2015-08-06 ENCOUNTER — Ambulatory Visit (INDEPENDENT_AMBULATORY_CARE_PROVIDER_SITE_OTHER): Payer: Medicare Other | Admitting: Internal Medicine

## 2015-08-06 VITALS — BP 158/81 | HR 61 | Temp 98.4°F | Resp 16 | Ht 68.0 in | Wt 176.0 lb

## 2015-08-06 DIAGNOSIS — M25511 Pain in right shoulder: Secondary | ICD-10-CM

## 2015-08-06 MED ORDER — MELOXICAM 15 MG PO TABS: 15.0000 mg | ORAL_TABLET | Freq: Every day | ORAL | Status: DC

## 2015-08-06 NOTE — Patient Instructions (Signed)
     IF you received an x-ray today, you will receive an invoice from [facility]. Please contact [facility] at [phone] with questions or concerns regarding your invoice.   IF you received labwork today, you will receive an invoice from [facility]. Please contact [facility] at [phone] with questions or concerns regarding your invoice.   Our billing staff will not be able to assist you with questions regarding bills from these companies.  You will be contacted with the lab results as soon as they are available. The fastest way to get your results is to activate your My Chart account. Instructions are located on the last page of this paperwork. If you have not heard from us regarding the results in 2 weeks, please contact this office.      

## 2015-08-06 NOTE — Progress Notes (Signed)
   Subjective:    [name], female    DOB: [birth year], 70 y.o.   MRN: [id number]  HPI Chief Complaint  Patient presents with  . Follow-up  . Shoulder Pain   Having significant right shoulder pain for one year. Started after a strain injury while picking up her invalid partner for bathroom assisting. On and off trouble since. Over the past month has progressed to causing nighttime discomfort every night. She only has pain during the daytime every few days and this does not compromise herYoga or bike riding. No swelling. Occasional pain in the left shoulder without this degree of difficulty. No neck problems. She will experience pins and needles in her right arm occasionally. There is never any swelling or redness.  There are no active problems to display for this patient. She is on no medications other than supplements  Review of Systems Noncontributory    Objective:   Physical Exam BP 158/81 mmHg  Pulse 61  Temp(Src) 98.4 F (36.9 C)  Resp 16  Ht 5\' 8"  (1.727 m)  Wt 176 lb (79.833 kg)  BMI 26.77 kg/m2 Neck is supple with full range of motion without pain She has mild tenderness deep in the lateral aspect of the right trapezius The right shoulder has good range of motion but does exhibit pain with elevation and with abduction against resistance Good external rotation and adduction No sensory or motor losses in the right extremity Left shoulder exam is normal       Assessment & Plan:  Pain in joint of right shoulder - Plan: Ambulatory referral to Physical Therapy w/ J O'Halloran--- if that fails will send to Dr. Micheline Chapman for evaluation and treatment//she can take meloxicam during this next 3-4 weeks  Elevated blood pressure-- no diagnosis of hypertension //normal blood pressures in the past She will do outside blood pressures for the next month and call me if she has abnormal  Follow-up primary care discussed

## 2015-08-19 ENCOUNTER — Telehealth: Payer: Self-pay | Admitting: Family Medicine

## 2015-08-19 NOTE — Telephone Encounter (Signed)
Can be reached: [phone]   Reason for call: Pt called requesting Dr. Charlett Blake as PCP. She stated she met Dr. Charlett Blake at her husband's bookstore. She said that Dr. Laney Pastor is retiring and she would like Dr. Charlett Blake to consider her as a patient. Please advise.

## 2015-08-19 NOTE — Telephone Encounter (Signed)
Yes I will accept her

## 2015-08-27 DIAGNOSIS — M25511 Pain in right shoulder: Secondary | ICD-10-CM | POA: Diagnosis not present

## 2015-09-01 DIAGNOSIS — M25511 Pain in right shoulder: Secondary | ICD-10-CM | POA: Diagnosis not present

## 2015-09-08 DIAGNOSIS — M25511 Pain in right shoulder: Secondary | ICD-10-CM | POA: Diagnosis not present

## 2015-09-11 DIAGNOSIS — M25511 Pain in right shoulder: Secondary | ICD-10-CM | POA: Diagnosis not present

## 2015-09-23 DIAGNOSIS — M25511 Pain in right shoulder: Secondary | ICD-10-CM | POA: Diagnosis not present

## 2015-09-30 DIAGNOSIS — M25511 Pain in right shoulder: Secondary | ICD-10-CM | POA: Diagnosis not present

## 2015-10-02 DIAGNOSIS — M25511 Pain in right shoulder: Secondary | ICD-10-CM | POA: Diagnosis not present

## 2015-10-06 DIAGNOSIS — M25511 Pain in right shoulder: Secondary | ICD-10-CM | POA: Diagnosis not present

## 2015-10-09 DIAGNOSIS — M25511 Pain in right shoulder: Secondary | ICD-10-CM | POA: Diagnosis not present

## 2015-10-14 DIAGNOSIS — M25511 Pain in right shoulder: Secondary | ICD-10-CM | POA: Diagnosis not present

## 2015-10-16 DIAGNOSIS — M25511 Pain in right shoulder: Secondary | ICD-10-CM | POA: Diagnosis not present

## 2015-10-20 DIAGNOSIS — M25511 Pain in right shoulder: Secondary | ICD-10-CM | POA: Diagnosis not present

## 2015-10-23 DIAGNOSIS — M25511 Pain in right shoulder: Secondary | ICD-10-CM | POA: Diagnosis not present

## 2015-10-28 DIAGNOSIS — M25511 Pain in right shoulder: Secondary | ICD-10-CM | POA: Diagnosis not present

## 2015-10-30 DIAGNOSIS — M25511 Pain in right shoulder: Secondary | ICD-10-CM | POA: Diagnosis not present

## 2015-11-03 DIAGNOSIS — M25511 Pain in right shoulder: Secondary | ICD-10-CM | POA: Diagnosis not present

## 2015-11-19 DIAGNOSIS — M25511 Pain in right shoulder: Secondary | ICD-10-CM | POA: Diagnosis not present

## 2015-11-26 DIAGNOSIS — L719 Rosacea, unspecified: Secondary | ICD-10-CM | POA: Diagnosis not present

## 2015-11-26 DIAGNOSIS — Z808 Family history of malignant neoplasm of other organs or systems: Secondary | ICD-10-CM | POA: Diagnosis not present

## 2015-11-26 DIAGNOSIS — D225 Melanocytic nevi of trunk: Secondary | ICD-10-CM | POA: Diagnosis not present

## 2015-11-26 DIAGNOSIS — L570 Actinic keratosis: Secondary | ICD-10-CM | POA: Diagnosis not present

## 2015-11-26 DIAGNOSIS — L821 Other seborrheic keratosis: Secondary | ICD-10-CM | POA: Diagnosis not present

## 2015-11-26 DIAGNOSIS — Z86018 Personal history of other benign neoplasm: Secondary | ICD-10-CM | POA: Diagnosis not present

## 2015-12-02 DIAGNOSIS — M25511 Pain in right shoulder: Secondary | ICD-10-CM | POA: Diagnosis not present

## 2015-12-08 DIAGNOSIS — M25511 Pain in right shoulder: Secondary | ICD-10-CM | POA: Diagnosis not present

## 2015-12-11 DIAGNOSIS — M25511 Pain in right shoulder: Secondary | ICD-10-CM | POA: Diagnosis not present

## 2015-12-16 DIAGNOSIS — H2513 Age-related nuclear cataract, bilateral: Secondary | ICD-10-CM | POA: Diagnosis not present

## 2015-12-16 DIAGNOSIS — H11822 Conjunctivochalasis, left eye: Secondary | ICD-10-CM | POA: Diagnosis not present

## 2015-12-16 DIAGNOSIS — H40013 Open angle with borderline findings, low risk, bilateral: Secondary | ICD-10-CM | POA: Diagnosis not present

## 2015-12-16 DIAGNOSIS — H10413 Chronic giant papillary conjunctivitis, bilateral: Secondary | ICD-10-CM | POA: Diagnosis not present

## 2015-12-17 DIAGNOSIS — M25511 Pain in right shoulder: Secondary | ICD-10-CM | POA: Diagnosis not present

## 2015-12-22 DIAGNOSIS — M25511 Pain in right shoulder: Secondary | ICD-10-CM | POA: Diagnosis not present

## 2015-12-29 ENCOUNTER — Encounter: Payer: Self-pay | Admitting: Family Medicine

## 2015-12-31 ENCOUNTER — Ambulatory Visit (INDEPENDENT_AMBULATORY_CARE_PROVIDER_SITE_OTHER): Payer: Medicare Other | Admitting: Family Medicine

## 2015-12-31 ENCOUNTER — Encounter: Payer: Self-pay | Admitting: Family Medicine

## 2015-12-31 VITALS — BP 142/80 | HR 64 | Temp 98.3°F | Ht 68.0 in | Wt 177.2 lb

## 2015-12-31 DIAGNOSIS — R220 Localized swelling, mass and lump, head: Secondary | ICD-10-CM | POA: Diagnosis not present

## 2015-12-31 DIAGNOSIS — M278 Other specified diseases of jaws: Secondary | ICD-10-CM

## 2015-12-31 NOTE — Progress Notes (Signed)
[place] [facility] [address], [place], [place] [address] [phone]  Date:  12/31/2015   Name:  Lindsay Harrison   DOB:  [birth year]   MRN:  [id number]  PCP:  Leandrew Koyanagi, MD    Chief Complaint: Mass (c/o neck lump that was noticed 4-5 weeks ago. )   History of Present Illness:  Lindsay Harrison is a 70 y.o. very pleasant female patient who presents with the following:  Here today as a new patient- she plans to see Dr. Charlett Blake to establish care in October.  She has noted a small nodule under her chin for about one month.  She did not think too much of it, but it has not gone away.   It is not really tender.  It does not seem to be getting bigger or changing.  She had sent [place] a mychart message and was told to come in to have this looked at so she came in.  No unexpected weight loss, rashes, other bumps. She will still have an occasional hot flash but no consistent night sweats, no fever, chills, GI symptoms   Wt Readings from Last 3 Encounters:  12/31/15 177 lb 3.2 oz (80.4 kg)  08/06/15 176 lb (79.8 kg)  02/05/15 176 lb 3.2 oz (79.9 kg)   Her weight is quite stable She feels like her swallowing is a little bit different- no pain, no "getting stuck," she notes just an awareness of her swallowing now. No tooth pain  There are no active problems to display for this patient.   No past medical history on file.  No past surgical history on file.  Social History  Substance Use Topics  . Smoking status: Never Smoker  . Smokeless tobacco: Never Used  . Alcohol use 1.8 - 3.6 oz/week    3 - 6 Standard drinks or equivalent per week     Comment: 5 glasses of wine a week.    Family History  Problem Relation Age of Onset  . Arthritis Mother   . Heart disease Father   . COPD Sister     Emphysema  . Stroke Maternal Aunt     Allergies  Allergen Reactions  . Erythromycin     Patient states that she passed out while  using this medication    Medication list has been reviewed and updated.  Current Outpatient Prescriptions on File Prior to Visit  Medication Sig Dispense Refill  . aspirin 81 MG tablet Take 81 mg by mouth daily.    . cholecalciferol (VITAMIN D) 1000 UNITS tablet Take 1,000 Units by mouth daily.    . fish oil-omega-3 fatty acids 1000 MG capsule Take 2 g by mouth daily.    . meloxicam (MOBIC) 15 MG tablet Take 1 tablet (15 mg total) by mouth daily. 30 tablet 0  . triamcinolone cream (KENALOG) 0.1 % Apply 1 application topically 3 (three) times daily. 85.2 g 1   No current facility-administered medications on file prior to visit.     Review of Systems:  As per HPI- otherwise negative.   Physical Examination: Vitals:   12/31/15 1400  BP: (!) 148/82  Pulse: 64  Temp: 98.3 F (36.8 C)   Vitals:   12/31/15 1400  Weight: 177 lb 3.2 oz (80.4 kg)  Height: 5\' 8"  (1.727 m)   Body mass index is 26.94 kg/m. Ideal Body Weight: Weight in (lb) to have BMI = 25: 164.1  GEN:  WDWN, NAD, Non-toxic, A & O x 3, looks well, normal weight HEENT: Atraumatic, Normocephalic. Neck supple. No masses, No LAD in the cervical, supraclavicular or axillary zones.  Bilateral TM wnl, oropharynx normal.  PEERL,EOMI.  approx 3/4 to 1cm diameter firm mass in the floor of the mouth which is palpable from above and below.  It is mobile and non- tender.  Teeth are in good repair.  Thyroid is normal  Ears and Nose: No external deformity. CV: RRR, No M/G/R. No JVD. No thrill. No extra heart sounds. PULM: CTA B, no wheezes, crackles, rhonchi. No retractions. No resp. distress. No accessory muscle use. ABD: S, NT, ND EXTR: No c/c/e NEURO Normal gait.  PSYCH: Normally interactive. Conversant. Not depressed or anxious appearing.  Calm demeanor.   Discussed with radiologist Dr. Nevada Crane- he recommends a Face CT without contrast for eval of suspected salivary stone  BP Readings from Last 3 Encounters:  12/31/15 (!)  142/80  08/06/15 (!) 158/81  02/05/15 133/77    Assessment and Plan: Jaw mass - Plan: CT Maxillofacial WO CM, CANCELED: CT Maxillofacial W/Cm   Here today with a small mass in the floor of her mouth.  Suspect that she has a salivary gland stone. She is concerned and although she hates to have an imaging study she does want to try and find out what this is. We will order a CT scan for her asap She will monitor her BP at the [facility]- notes that she was quite nervous about her concern today and that is probably why her BP is up.   Signed Lamar Blinks, MD

## 2015-12-31 NOTE — Patient Instructions (Addendum)
We will set you up for a CT to evaluate the area of concern in your jaw. Let me know if you do not hear about your appointment soon Keep an eye on your BP at the [facility] when you go to work out- we would like to see you under 140/85 on average

## 2015-12-31 NOTE — Progress Notes (Signed)
Pre visit review using our clinic review tool, if applicable. No additional management support is needed unless otherwise documented below in the visit note. 

## 2016-01-02 ENCOUNTER — Ambulatory Visit (HOSPITAL_BASED_OUTPATIENT_CLINIC_OR_DEPARTMENT_OTHER)
Admission: RE | Admit: 2016-01-02 | Discharge: 2016-01-02 | Disposition: A | Discharge status: Home / Self Care | Payer: Medicare Other | Source: Ambulatory Visit | Attending: Family Medicine | Admitting: Family Medicine

## 2016-01-02 ENCOUNTER — Other Ambulatory Visit: Payer: Self-pay | Admitting: Family Medicine

## 2016-01-02 ENCOUNTER — Encounter: Payer: Self-pay | Admitting: Family Medicine

## 2016-01-02 ENCOUNTER — Telehealth: Payer: Self-pay | Admitting: Family Medicine

## 2016-01-02 DIAGNOSIS — M278 Other specified diseases of jaws: Secondary | ICD-10-CM

## 2016-01-02 DIAGNOSIS — R220 Localized swelling, mass and lump, head: Secondary | ICD-10-CM | POA: Insufficient documentation

## 2016-01-02 NOTE — Telephone Encounter (Signed)
Pt called in because she says that she forgot to ask provider if she should also be tested for lime disease also considering that she do a lot of yard work. Pt would like a call back to be advised.

## 2016-01-02 NOTE — Telephone Encounter (Signed)
Called pt and LMOM_ will send her a Pharmacist, community

## 2016-01-08 DIAGNOSIS — M25511 Pain in right shoulder: Secondary | ICD-10-CM | POA: Diagnosis not present

## 2016-01-27 ENCOUNTER — Ambulatory Visit (HOSPITAL_BASED_OUTPATIENT_CLINIC_OR_DEPARTMENT_OTHER)
Admission: RE | Admit: 2016-01-27 | Discharge: 2016-01-27 | Disposition: A | Discharge status: Home / Self Care | Payer: Medicare Other | Source: Ambulatory Visit | Attending: Family Medicine | Admitting: Family Medicine

## 2016-01-27 ENCOUNTER — Ambulatory Visit (INDEPENDENT_AMBULATORY_CARE_PROVIDER_SITE_OTHER): Payer: Medicare Other | Admitting: Family Medicine

## 2016-01-27 ENCOUNTER — Encounter: Payer: Self-pay | Admitting: Family Medicine

## 2016-01-27 VITALS — BP 132/82 | HR 64 | Temp 98.5°F | Ht 68.0 in | Wt 174.1 lb

## 2016-01-27 DIAGNOSIS — M8588 Other specified disorders of bone density and structure, other site: Secondary | ICD-10-CM | POA: Diagnosis not present

## 2016-01-27 DIAGNOSIS — Z8619 Personal history of other infectious and parasitic diseases: Secondary | ICD-10-CM | POA: Diagnosis not present

## 2016-01-27 DIAGNOSIS — M85852 Other specified disorders of bone density and structure, left thigh: Secondary | ICD-10-CM | POA: Diagnosis not present

## 2016-01-27 DIAGNOSIS — L570 Actinic keratosis: Secondary | ICD-10-CM | POA: Insufficient documentation

## 2016-01-27 DIAGNOSIS — E2839 Other primary ovarian failure: Secondary | ICD-10-CM

## 2016-01-27 DIAGNOSIS — D649 Anemia, unspecified: Secondary | ICD-10-CM | POA: Diagnosis not present

## 2016-01-27 DIAGNOSIS — Z780 Asymptomatic menopausal state: Secondary | ICD-10-CM | POA: Insufficient documentation

## 2016-01-27 DIAGNOSIS — E559 Vitamin D deficiency, unspecified: Secondary | ICD-10-CM | POA: Diagnosis not present

## 2016-01-27 DIAGNOSIS — Z8572 Personal history of non-Hodgkin lymphomas: Secondary | ICD-10-CM | POA: Insufficient documentation

## 2016-01-27 DIAGNOSIS — F4320 Adjustment disorder, unspecified: Secondary | ICD-10-CM

## 2016-01-27 DIAGNOSIS — Z8742 Personal history of other diseases of the female genital tract: Secondary | ICD-10-CM | POA: Diagnosis not present

## 2016-01-27 DIAGNOSIS — Z23 Encounter for immunization: Secondary | ICD-10-CM | POA: Diagnosis not present

## 2016-01-27 DIAGNOSIS — C8330 Diffuse large B-cell lymphoma, unspecified site: Secondary | ICD-10-CM | POA: Insufficient documentation

## 2016-01-27 DIAGNOSIS — R591 Generalized enlarged lymph nodes: Secondary | ICD-10-CM | POA: Diagnosis not present

## 2016-01-27 DIAGNOSIS — F4321 Adjustment disorder with depressed mood: Secondary | ICD-10-CM | POA: Diagnosis not present

## 2016-01-27 DIAGNOSIS — E782 Mixed hyperlipidemia: Secondary | ICD-10-CM | POA: Insufficient documentation

## 2016-01-27 DIAGNOSIS — Z86010 Personal history of colonic polyps: Secondary | ICD-10-CM

## 2016-01-27 DIAGNOSIS — Z0000 Encounter for general adult medical examination without abnormal findings: Secondary | ICD-10-CM | POA: Diagnosis not present

## 2016-01-27 HISTORY — DX: Mixed hyperlipidemia: E78.2

## 2016-01-27 HISTORY — DX: Adjustment disorder, unspecified: F43.20

## 2016-01-27 HISTORY — DX: Encounter for general adult medical examination without abnormal findings: Z00.00

## 2016-01-27 HISTORY — DX: Adjustment disorder with depressed mood: F43.21

## 2016-01-27 NOTE — Assessment & Plan Note (Signed)
Patient encouraged to maintain heart healthy diet, regular exercise, adequate sleep. Consider daily probiotics. Take medications as prescribed. Given and reviewed copy of ACP documents from [place] Secretary of State and encouraged to complete and return 

## 2016-01-27 NOTE — Progress Notes (Signed)
Pre visit review using our clinic review tool, if applicable. No additional management support is needed unless otherwise documented below in the visit note. 

## 2016-01-27 NOTE — Patient Instructions (Signed)
Preventive Care for Adults, Female A healthy lifestyle and preventive care can promote health and wellness. Preventive health guidelines for women include the following key practices.  A routine yearly physical is a good way to check with your health care provider about your health and preventive screening. It is a chance to share any concerns and updates on your health and to receive a thorough exam.  Visit your dentist for a routine exam and preventive care every 6 months. Brush your teeth twice a day and floss once a day. Good oral hygiene prevents tooth decay and gum disease.  The frequency of eye exams is based on your age, health, family medical history, use of contact lenses, and other factors. Follow your health care provider's recommendations for frequency of eye exams.  Eat a healthy diet. Foods like vegetables, fruits, whole grains, low-fat dairy products, and lean protein foods contain the nutrients you need without too many calories. Decrease your intake of foods high in solid fats, added sugars, and salt. Eat the right amount of calories for you.Get information about a proper diet from your health care provider, if necessary.  Regular physical exercise is one of the most important things you can do for your health. Most adults should get at least 150 minutes of moderate-intensity exercise (any activity that increases your heart rate and causes you to sweat) each week. In addition, most adults need muscle-strengthening exercises on 2 or more days a week.  Maintain a healthy weight. The body mass index (BMI) is a screening tool to identify possible weight problems. It provides an estimate of body fat based on height and weight. Your health care provider can find your BMI and can help you achieve or maintain a healthy weight.For adults 20 years and older:  A BMI below 18.5 is considered underweight.  A BMI of 18.5 to 24.9 is normal.  A BMI of 25 to 29.9 is considered overweight.  A  BMI of 30 and above is considered obese.  Maintain normal blood lipids and cholesterol levels by exercising and minimizing your intake of saturated fat. Eat a balanced diet with plenty of fruit and vegetables. Blood tests for lipids and cholesterol should begin at age 45 and be repeated every 5 years. If your lipid or cholesterol levels are high, you are over 50, or you are at high risk for heart disease, you may need your cholesterol levels checked more frequently.Ongoing high lipid and cholesterol levels should be treated with medicines if diet and exercise are not working.  If you smoke, find out from your health care provider how to quit. If you do not use tobacco, do not start.  Lung cancer screening is recommended for adults aged 45-80 years who are at high risk for developing lung cancer because of a history of smoking. A yearly low-dose CT scan of the lungs is recommended for people who have at least a 30-pack-year history of smoking and are a current smoker or have quit within the past 15 years. A pack year of smoking is smoking an average of 1 pack of cigarettes a day for 1 year (for example: 1 pack a day for 30 years or 2 packs a day for 15 years). Yearly screening should continue until the smoker has stopped smoking for at least 15 years. Yearly screening should be stopped for people who develop a health problem that would prevent them from having lung cancer treatment.  If you are pregnant, do not drink alcohol. If you are  breastfeeding, be very cautious about drinking alcohol. If you are not pregnant and choose to drink alcohol, do not have more than 1 drink per day. One drink is considered to be 12 ounces (355 mL) of beer, 5 ounces (148 mL) of wine, or 1.5 ounces (44 mL) of liquor.  Avoid use of street drugs. Do not share needles with anyone. Ask for help if you need support or instructions about stopping the use of drugs.  High blood pressure causes heart disease and increases the risk  of stroke. Your blood pressure should be checked at least every 1 to 2 years. Ongoing high blood pressure should be treated with medicines if weight loss and exercise do not work.  If you are 55-79 years old, ask your health care provider if you should take aspirin to prevent strokes.  Diabetes screening is done by taking a blood sample to check your blood glucose level after you have not eaten for a certain period of time (fasting). If you are not overweight and you do not have risk factors for diabetes, you should be screened once every 3 years starting at age 45. If you are overweight or obese and you are 40-70 years of age, you should be screened for diabetes every year as part of your cardiovascular risk assessment.  Breast cancer screening is essential preventive care for women. You should practice "breast self-awareness." This means understanding the normal appearance and feel of your breasts and may include breast self-examination. Any changes detected, no matter how small, should be reported to a health care provider. Women in their 20s and 30s should have a clinical breast exam (CBE) by a health care provider as part of a regular health exam every 1 to 3 years. After age 40, women should have a CBE every year. Starting at age 40, women should consider having a mammogram (breast X-ray test) every year. Women who have a family history of breast cancer should talk to their health care provider about genetic screening. Women at a high risk of breast cancer should talk to their health care providers about having an MRI and a mammogram every year.  Breast cancer gene (BRCA)-related cancer risk assessment is recommended for women who have family members with BRCA-related cancers. BRCA-related cancers include breast, ovarian, tubal, and peritoneal cancers. Having family members with these cancers may be associated with an increased risk for harmful changes (mutations) in the breast cancer genes BRCA1 and  BRCA2. Results of the assessment will determine the need for genetic counseling and BRCA1 and BRCA2 testing.  Your health care provider may recommend that you be screened regularly for cancer of the pelvic organs (ovaries, uterus, and vagina). This screening involves a pelvic examination, including checking for microscopic changes to the surface of your cervix (Pap test). You may be encouraged to have this screening done every 3 years, beginning at age 21.  For women ages 30-65, health care providers may recommend pelvic exams and Pap testing every 3 years, or they may recommend the Pap and pelvic exam, combined with testing for human papilloma virus (HPV), every 5 years. Some types of HPV increase your risk of cervical cancer. Testing for HPV may also be done on women of any age with unclear Pap test results.  Other health care providers may not recommend any screening for nonpregnant women who are considered low risk for pelvic cancer and who do not have symptoms. Ask your health care provider if a screening pelvic exam is right for   you.  If you have had past treatment for cervical cancer or a condition that could lead to cancer, you need Pap tests and screening for cancer for at least 20 years after your treatment. If Pap tests have been discontinued, your risk factors (such as having a new sexual partner) need to be reassessed to determine if screening should resume. Some women have medical problems that increase the chance of getting cervical cancer. In these cases, your health care provider may recommend more frequent screening and Pap tests.  Colorectal cancer can be detected and often prevented. Most routine colorectal cancer screening begins at the age of 50 years and continues through age 75 years. However, your health care provider may recommend screening at an earlier age if you have risk factors for colon cancer. On a yearly basis, your health care provider may provide home test kits to check  for hidden blood in the stool. Use of a small camera at the end of a tube, to directly examine the colon (sigmoidoscopy or colonoscopy), can detect the earliest forms of colorectal cancer. Talk to your health care provider about this at age 50, when routine screening begins. Direct exam of the colon should be repeated every 5-10 years through age 75 years, unless early forms of precancerous polyps or small growths are found.  People who are at an increased risk for hepatitis B should be screened for this virus. You are considered at high risk for hepatitis B if:  You were born in a country where hepatitis B occurs often. Talk with your health care provider about which countries are considered high risk.  Your parents were born in a high-risk country and you have not received a shot to protect against hepatitis B (hepatitis B vaccine).  You have HIV or AIDS.  You use needles to inject street drugs.  You live with, or have sex with, someone who has hepatitis B.  You get hemodialysis treatment.  You take certain medicines for conditions like cancer, organ transplantation, and autoimmune conditions.  Hepatitis C blood testing is recommended for all people born from 1945 through 1965 and any individual with known risks for hepatitis C.  Practice safe sex. Use condoms and avoid high-risk sexual practices to reduce the spread of sexually transmitted infections (STIs). STIs include gonorrhea, chlamydia, syphilis, trichomonas, herpes, HPV, and human immunodeficiency virus (HIV). Herpes, HIV, and HPV are viral illnesses that have no cure. They can result in disability, cancer, and death.  You should be screened for sexually transmitted illnesses (STIs) including gonorrhea and chlamydia if:  You are sexually active and are younger than 24 years.  You are older than 24 years and your health care provider tells you that you are at risk for this type of infection.  Your sexual activity has changed  since you were last screened and you are at an increased risk for chlamydia or gonorrhea. Ask your health care provider if you are at risk.  If you are at risk of being infected with HIV, it is recommended that you take a prescription medicine daily to prevent HIV infection. This is called preexposure prophylaxis (PrEP). You are considered at risk if:  You are sexually active and do not regularly use condoms or know the HIV status of your partner(s).  You take drugs by injection.  You are sexually active with a partner who has HIV.  Talk with your health care provider about whether you are at high risk of being infected with HIV. If   you choose to begin PrEP, you should first be tested for HIV. You should then be tested every 3 months for as long as you are taking PrEP.  Osteoporosis is a disease in which the bones lose minerals and strength with aging. This can result in serious bone fractures or breaks. The risk of osteoporosis can be identified using a bone density scan. Women ages 67 years and over and women at risk for fractures or osteoporosis should discuss screening with their health care providers. Ask your health care provider whether you should take a calcium supplement or vitamin D to reduce the rate of osteoporosis.  Menopause can be associated with physical symptoms and risks. Hormone replacement therapy is available to decrease symptoms and risks. You should talk to your health care provider about whether hormone replacement therapy is right for you.  Use sunscreen. Apply sunscreen liberally and repeatedly throughout the day. You should seek shade when your shadow is shorter than you. Protect yourself by wearing long sleeves, pants, a wide-brimmed hat, and sunglasses year round, whenever you are outdoors.  Once a month, do a whole body skin exam, using a mirror to look at the skin on your back. Tell your health care provider of new moles, moles that have irregular borders, moles that  are larger than a pencil eraser, or moles that have changed in shape or color.  Stay current with required vaccines (immunizations).  Influenza vaccine. All adults should be immunized every year.  Tetanus, diphtheria, and acellular pertussis (Td, Tdap) vaccine. Pregnant women should receive 1 dose of Tdap vaccine during each pregnancy. The dose should be obtained regardless of the length of time since the last dose. Immunization is preferred during the 27th-36th week of gestation. An adult who has not previously received Tdap or who does not know her vaccine status should receive 1 dose of Tdap. This initial dose should be followed by tetanus and diphtheria toxoids (Td) booster doses every 10 years. Adults with an unknown or incomplete history of completing a 3-dose immunization series with Td-containing vaccines should begin or complete a primary immunization series including a Tdap dose. Adults should receive a Td booster every 10 years.  Varicella vaccine. An adult without evidence of immunity to varicella should receive 2 doses or a second dose if she has previously received 1 dose. Pregnant females who do not have evidence of immunity should receive the first dose after pregnancy. This first dose should be obtained before leaving the health care facility. The second dose should be obtained 4-8 weeks after the first dose.  Human papillomavirus (HPV) vaccine. Females aged 13-26 years who have not received the vaccine previously should obtain the 3-dose series. The vaccine is not recommended for use in pregnant females. However, pregnancy testing is not needed before receiving a dose. If a female is found to be pregnant after receiving a dose, no treatment is needed. In that case, the remaining doses should be delayed until after the pregnancy. Immunization is recommended for any person with an immunocompromised condition through the age of 61 years if she did not get any or all doses earlier. During the  3-dose series, the second dose should be obtained 4-8 weeks after the first dose. The third dose should be obtained 24 weeks after the first dose and 16 weeks after the second dose.  Zoster vaccine. One dose is recommended for adults aged 30 years or older unless certain conditions are present.  Measles, mumps, and rubella (MMR) vaccine. Adults born  before 1957 generally are considered immune to measles and mumps. Adults born in 1957 or later should have 1 or more doses of MMR vaccine unless there is a contraindication to the vaccine or there is laboratory evidence of immunity to each of the three diseases. A routine second dose of MMR vaccine should be obtained at least 28 days after the first dose for students attending postsecondary schools, health care workers, or international travelers. People who received inactivated measles vaccine or an unknown type of measles vaccine during 1963-1967 should receive 2 doses of MMR vaccine. People who received inactivated mumps vaccine or an unknown type of mumps vaccine before 1979 and are at high risk for mumps infection should consider immunization with 2 doses of MMR vaccine. For females of childbearing age, rubella immunity should be determined. If there is no evidence of immunity, females who are not pregnant should be vaccinated. If there is no evidence of immunity, females who are pregnant should delay immunization until after pregnancy. Unvaccinated health care workers born before 1957 who lack laboratory evidence of measles, mumps, or rubella immunity or laboratory confirmation of disease should consider measles and mumps immunization with 2 doses of MMR vaccine or rubella immunization with 1 dose of MMR vaccine.  Pneumococcal 13-valent conjugate (PCV13) vaccine. When indicated, a person who is uncertain of his immunization history and has no record of immunization should receive the PCV13 vaccine. All adults 65 years of age and older should receive this  vaccine. An adult aged 19 years or older who has certain medical conditions and has not been previously immunized should receive 1 dose of PCV13 vaccine. This PCV13 should be followed with a dose of pneumococcal polysaccharide (PPSV23) vaccine. Adults who are at high risk for pneumococcal disease should obtain the PPSV23 vaccine at least 8 weeks after the dose of PCV13 vaccine. Adults older than 70 years of age who have normal immune system function should obtain the PPSV23 vaccine dose at least 1 year after the dose of PCV13 vaccine.  Pneumococcal polysaccharide (PPSV23) vaccine. When PCV13 is also indicated, PCV13 should be obtained first. All adults aged 65 years and older should be immunized. An adult younger than age 65 years who has certain medical conditions should be immunized. Any person who resides in a nursing home or long-term care facility should be immunized. An adult smoker should be immunized. People with an immunocompromised condition and certain other conditions should receive both PCV13 and PPSV23 vaccines. People with human immunodeficiency virus (HIV) infection should be immunized as soon as possible after diagnosis. Immunization during chemotherapy or radiation therapy should be avoided. Routine use of PPSV23 vaccine is not recommended for American Indians, [place] Natives, or people younger than 65 years unless there are medical conditions that require PPSV23 vaccine. When indicated, people who have unknown immunization and have no record of immunization should receive PPSV23 vaccine. One-time revaccination 5 years after the first dose of PPSV23 is recommended for people aged 19-64 years who have chronic kidney failure, nephrotic syndrome, asplenia, or immunocompromised conditions. People who received 1-2 doses of PPSV23 before age 65 years should receive another dose of PPSV23 vaccine at age 65 years or later if at least 5 years have passed since the previous dose. Doses of PPSV23 are not  needed for people immunized with PPSV23 at or after age 65 years.  Meningococcal vaccine. Adults with asplenia or persistent complement component deficiencies should receive 2 doses of quadrivalent meningococcal conjugate (MenACWY-D) vaccine. The doses should be obtained   at least 2 months apart. Microbiologists working with certain meningococcal bacteria, [place] recruits, people at risk during an outbreak, and people who travel to or live in countries with a high rate of meningitis should be immunized. A first-year college student up through age 34 years who is living in a residence hall should receive a dose if she did not receive a dose on or after her 16th birthday. Adults who have certain high-risk conditions should receive one or more doses of vaccine.  Hepatitis A vaccine. Adults who wish to be protected from this disease, have certain high-risk conditions, work with hepatitis A-infected animals, work in hepatitis A research labs, or travel to or work in countries with a high rate of hepatitis A should be immunized. Adults who were previously unvaccinated and who anticipate close contact with an international adoptee during the first 60 days after arrival in the [place] from a country with a high rate of hepatitis A should be immunized.  Hepatitis B vaccine. Adults who wish to be protected from this disease, have certain high-risk conditions, may be exposed to blood or other infectious body fluids, are household contacts or sex partners of hepatitis B positive people, are clients or workers in certain care facilities, or travel to or work in countries with a high rate of hepatitis B should be immunized.  Haemophilus influenzae type b (Hib) vaccine. A previously unvaccinated person with asplenia or sickle cell disease or having a scheduled splenectomy should receive 1 dose of Hib vaccine. Regardless of previous immunization, a recipient of a hematopoietic stem cell transplant should receive a  3-dose series 6-12 months after her successful transplant. Hib vaccine is not recommended for adults with HIV infection. Preventive Services / Frequency Ages 35 to 4 years  Blood pressure check.** / Every 3-5 years.  Lipid and cholesterol check.** / Every 5 years beginning at age 60.  Clinical breast exam.** / Every 3 years for women in their 71s and 10s.  BRCA-related cancer risk assessment.** / For women who have family members with a BRCA-related cancer (breast, ovarian, tubal, or peritoneal cancers).  Pap test.** / Every 2 years from ages 76 through 26. Every 3 years starting at age 61 through age 76 or 93 with a history of 3 consecutive normal Pap tests.  HPV screening.** / Every 3 years from ages 37 through ages 60 to 51 with a history of 3 consecutive normal Pap tests.  Hepatitis C blood test.** / For any individual with known risks for hepatitis C.  Skin self-exam. / Monthly.  Influenza vaccine. / Every year.  Tetanus, diphtheria, and acellular pertussis (Tdap, Td) vaccine.** / Consult your health care provider. Pregnant women should receive 1 dose of Tdap vaccine during each pregnancy. 1 dose of Td every 10 years.  Varicella vaccine.** / Consult your health care provider. Pregnant females who do not have evidence of immunity should receive the first dose after pregnancy.  HPV vaccine. / 3 doses over 6 months, if 93 and younger. The vaccine is not recommended for use in pregnant females. However, pregnancy testing is not needed before receiving a dose.  Measles, mumps, rubella (MMR) vaccine.** / You need at least 1 dose of MMR if you were born in 1957 or later. You may also need a 2nd dose. For females of childbearing age, rubella immunity should be determined. If there is no evidence of immunity, females who are not pregnant should be vaccinated. If there is no evidence of immunity, females who are  pregnant should delay immunization until after pregnancy.  Pneumococcal  13-valent conjugate (PCV13) vaccine.** / Consult your health care provider.  Pneumococcal polysaccharide (PPSV23) vaccine.** / 1 to 2 doses if you smoke cigarettes or if you have certain conditions.  Meningococcal vaccine.** / 1 dose if you are age 68 to 8 years and a Market researcher living in a residence hall, or have one of several medical conditions, you need to get vaccinated against meningococcal disease. You may also need additional booster doses.  Hepatitis A vaccine.** / Consult your health care provider.  Hepatitis B vaccine.** / Consult your health care provider.  Haemophilus influenzae type b (Hib) vaccine.** / Consult your health care provider. Ages 7 to 53 years  Blood pressure check.** / Every year.  Lipid and cholesterol check.** / Every 5 years beginning at age 25 years.  Lung cancer screening. / Every year if you are aged 11-80 years and have a 30-pack-year history of smoking and currently smoke or have quit within the past 15 years. Yearly screening is stopped once you have quit smoking for at least 15 years or develop a health problem that would prevent you from having lung cancer treatment.  Clinical breast exam.** / Every year after age 48 years.  BRCA-related cancer risk assessment.** / For women who have family members with a BRCA-related cancer (breast, ovarian, tubal, or peritoneal cancers).  Mammogram.** / Every year beginning at age 41 years and continuing for as long as you are in good health. Consult with your health care provider.  Pap test.** / Every 3 years starting at age 65 years through age 37 or 70 years with a history of 3 consecutive normal Pap tests.  HPV screening.** / Every 3 years from ages 72 years through ages 60 to 40 years with a history of 3 consecutive normal Pap tests.  Fecal occult blood test (FOBT) of stool. / Every year beginning at age 21 years and continuing until age 5 years. You may not need to do this test if you get  a colonoscopy every 10 years.  Flexible sigmoidoscopy or colonoscopy.** / Every 5 years for a flexible sigmoidoscopy or every 10 years for a colonoscopy beginning at age 35 years and continuing until age 48 years.  Hepatitis C blood test.** / For all people born from [birth year] through 1965 and any individual with known risks for hepatitis C.  Skin self-exam. / Monthly.  Influenza vaccine. / Every year.  Tetanus, diphtheria, and acellular pertussis (Tdap/Td) vaccine.** / Consult your health care provider. Pregnant women should receive 1 dose of Tdap vaccine during each pregnancy. 1 dose of Td every 10 years.  Varicella vaccine.** / Consult your health care provider. Pregnant females who do not have evidence of immunity should receive the first dose after pregnancy.  Zoster vaccine.** / 1 dose for adults aged 30 years or older.  Measles, mumps, rubella (MMR) vaccine.** / You need at least 1 dose of MMR if you were born in 1957 or later. You may also need a second dose. For females of childbearing age, rubella immunity should be determined. If there is no evidence of immunity, females who are not pregnant should be vaccinated. If there is no evidence of immunity, females who are pregnant should delay immunization until after pregnancy.  Pneumococcal 13-valent conjugate (PCV13) vaccine.** / Consult your health care provider.  Pneumococcal polysaccharide (PPSV23) vaccine.** / 1 to 2 doses if you smoke cigarettes or if you have certain conditions.  Meningococcal vaccine.** /  Consult your health care provider.  Hepatitis A vaccine.** / Consult your health care provider.  Hepatitis B vaccine.** / Consult your health care provider.  Haemophilus influenzae type b (Hib) vaccine.** / Consult your health care provider. Ages 64 years and over  Blood pressure check.** / Every year.  Lipid and cholesterol check.** / Every 5 years beginning at age 23 years.  Lung cancer screening. / Every year if you  are aged 16-80 years and have a 30-pack-year history of smoking and currently smoke or have quit within the past 15 years. Yearly screening is stopped once you have quit smoking for at least 15 years or develop a health problem that would prevent you from having lung cancer treatment.  Clinical breast exam.** / Every year after age 74 years.  BRCA-related cancer risk assessment.** / For women who have family members with a BRCA-related cancer (breast, ovarian, tubal, or peritoneal cancers).  Mammogram.** / Every year beginning at age 44 years and continuing for as long as you are in good health. Consult with your health care provider.  Pap test.** / Every 3 years starting at age 58 years through age 22 or 39 years with 3 consecutive normal Pap tests. Testing can be stopped between 65 and 70 years with 3 consecutive normal Pap tests and no abnormal Pap or HPV tests in the past 10 years.  HPV screening.** / Every 3 years from ages 64 years through ages 70 or 61 years with a history of 3 consecutive normal Pap tests. Testing can be stopped between 65 and 70 years with 3 consecutive normal Pap tests and no abnormal Pap or HPV tests in the past 10 years.  Fecal occult blood test (FOBT) of stool. / Every year beginning at age 40 years and continuing until age 27 years. You may not need to do this test if you get a colonoscopy every 10 years.  Flexible sigmoidoscopy or colonoscopy.** / Every 5 years for a flexible sigmoidoscopy or every 10 years for a colonoscopy beginning at age 7 years and continuing until age 32 years.  Hepatitis C blood test.** / For all people born from [birth year] through 1965 and any individual with known risks for hepatitis C.  Osteoporosis screening.** / A one-time screening for women ages 30 years and over and women at risk for fractures or osteoporosis.  Skin self-exam. / Monthly.  Influenza vaccine. / Every year.  Tetanus, diphtheria, and acellular pertussis (Tdap/Td)  vaccine.** / 1 dose of Td every 10 years.  Varicella vaccine.** / Consult your health care provider.  Zoster vaccine.** / 1 dose for adults aged 35 years or older.  Pneumococcal 13-valent conjugate (PCV13) vaccine.** / Consult your health care provider.  Pneumococcal polysaccharide (PPSV23) vaccine.** / 1 dose for all adults aged 46 years and older.  Meningococcal vaccine.** / Consult your health care provider.  Hepatitis A vaccine.** / Consult your health care provider.  Hepatitis B vaccine.** / Consult your health care provider.  Haemophilus influenzae type b (Hib) vaccine.** / Consult your health care provider. ** Family history and personal history of risk and conditions may change your health care provider's recommendations.   This information is not intended to replace advice given to you by your health care provider. Make sure you discuss any questions you have with your health care provider.   Document Released: 06/15/2001 Document Revised: 05/10/2014 Document Reviewed: 09/14/2010 Elsevier Interactive Patient Education [employer].

## 2016-01-27 NOTE — Assessment & Plan Note (Signed)
Took Zostavax

## 2016-01-27 NOTE — Assessment & Plan Note (Addendum)
Check level today, take a daily supplements. Labs reveal deficiency. Start on Vitamin D 50000 IU caps, 1 cap po weekly x 12 weeks. Disp #4 with 4 rf. Also take daily Vitamin D over the counter. If already taking a daily supplement increase by 1000 IU daily and if not start Vitamin D 2000 IU daily.

## 2016-01-28 ENCOUNTER — Ambulatory Visit: Payer: Medicare Other | Admitting: Family Medicine

## 2016-01-28 LAB — COMPREHENSIVE METABOLIC PANEL
ALT: 16 U/L (ref 0–35)
AST: 17 U/L (ref 0–37)
Albumin: 4.4 g/dL (ref 3.5–5.2)
Alkaline Phosphatase: 83 U/L (ref 39–117)
BUN: 13 mg/dL (ref 6–23)
CO2: 30 mEq/L (ref 19–32)
Calcium: 9.4 mg/dL (ref 8.4–10.5)
Chloride: 102 mEq/L (ref 96–112)
Creatinine, Ser: 0.75 mg/dL (ref 0.40–1.20)
GFR: 81.23 mL/min (ref >60.00)
Glucose, Bld: 87 mg/dL (ref 70–99)
Potassium: 4.6 mEq/L (ref 3.5–5.1)
Sodium: 138 mEq/L (ref 135–145)
Total Bilirubin: 0.8 mg/dL (ref 0.2–1.2)
Total Protein: 7.4 g/dL (ref 6.0–8.3)

## 2016-01-28 LAB — LIPID PANEL
Cholesterol: 227 mg/dL — ABNORMAL HIGH (ref 0–200)
HDL: 77.8 mg/dL (ref >39.00)
LDL Cholesterol: 137 mg/dL — ABNORMAL HIGH (ref 0–99)
NonHDL: 149.61
Total CHOL/HDL Ratio: 3
Triglycerides: 65 mg/dL (ref 0.0–149.0)
VLDL: 13 mg/dL (ref 0.0–40.0)

## 2016-01-28 LAB — TSH: TSH: 1.52 u[IU]/mL (ref 0.35–4.50)

## 2016-01-28 LAB — LYME DISEASE ABS IGG, IGM, IFA, CSF

## 2016-01-28 LAB — CBC
HCT: 41.8 % (ref 36.0–46.0)
Hemoglobin: 14.2 g/dL (ref 12.0–15.0)
MCHC: 34.1 g/dL (ref 30.0–36.0)
MCV: 89.7 fl (ref 78.0–100.0)
Platelets: 282 10*3/uL (ref 150.0–400.0)
RBC: 4.66 Mil/uL (ref 3.87–5.11)
RDW: 13.8 % (ref 11.5–15.5)
WBC: 5.9 10*3/uL (ref 4.0–10.5)

## 2016-01-28 LAB — VITAMIN D 25 HYDROXY (VIT D DEFICIENCY, FRACTURES): VITD: 26.78 ng/mL — ABNORMAL LOW (ref 30.00–100.00)

## 2016-01-28 LAB — EHRLICHIA ANTIBODY PANEL
E chaffeensis (HGE) Ab, IgG: <1:64 {titer}
E chaffeensis (HGE) Ab, IgM: <1:20 {titer}

## 2016-01-28 LAB — LYME AB/WESTERN BLOT REFLEX: B burgdorferi Ab IgG+IgM: <0.9 Index (ref <0.90)

## 2016-01-30 LAB — ROCKY MTN SPOTTED FVR ABS PNL(IGG+IGM)
RMSF IgG: NOT DETECTED
RMSF IgM: NOT DETECTED

## 2016-01-31 NOTE — Assessment & Plan Note (Addendum)
Single lesion under chin, stable for over a month. Imaging was inconclusive will refer to general surgery for consideration of biopsy. Tick studies all negative

## 2016-01-31 NOTE — Assessment & Plan Note (Signed)
Encouraged heart healthy diet, increase exercise, avoid trans fats, consider a krill oil cap daily 

## 2016-01-31 NOTE — Assessment & Plan Note (Signed)
She has a dermatologist and agrees to call for appt.

## 2016-01-31 NOTE — Assessment & Plan Note (Signed)
Husband very ill with Glioblastoma. She feels she is managing adequately most days but feels overwhelmed on others. She declines meds for now but will let us know if she changes her mind.

## 2016-01-31 NOTE — Progress Notes (Signed)
[name], female   DOB: 08-Sep-1945, 70 y.o.   MRN: [id number]   Subjective:    [name], female    DOB: [birth year], 70 y.o.   MRN: [id number]  Chief Complaint  Patient presents with  . Annual Exam    HPI Patient is in today for new patient appointment. Her previous PMD has retired. Was in recently to have a nodule under her chin for past 1-2 months. Not growing but not resolving. Denies being ill when the lesion first appeared. Her past medical hsitory also includes vitamin d def, colonic polyps, anemia, and hyperlipidemia. She feels fairly well today although she does endorse a tick bite with fatigue and myalgias occurring. She sees Dr Benjie Karvonen for GYN care and Dr Tonia Brooms for dermatology. Denies CP/palp/SOB/HA/congestion/fevers/GI or GU c/o. Taking meds as prescribed  Past Medical History:  Diagnosis Date  . Anemia    h/o iron deficiency secondary to heavy menses  . Grief reaction 01/27/2016  . H/O measles   . History of chicken pox   . History of PCOS   . Hyperlipidemia, mixed 01/27/2016  . Lymphadenopathy   . Preventative health care 01/27/2016  . Vitamin D deficiency     Past Surgical History:  Procedure Laterality Date  . SKIN BIOPSY Left    face    Family History  Problem Relation Age of Onset  . Arthritis Mother     rheumatoid  . Heart disease Father     CHF  . COPD Sister     Emphysema, h/o cigarettes  . Other Sister     pituatary tumor  . Arthritis Sister   . Obesity Sister   . Cancer Paternal Grandfather     cancer  . Stroke Maternal Aunt     Social History   Social History  . Marital status: Married    Spouse name: N/A  . Number of children: N/A  . Years of education: N/A   Occupational History  . Psychotherapist    Social History Main Topics  . Smoking status: Never Smoker  . Smokeless tobacco: Never Used  . Alcohol use 1.8 - 3.6 oz/week    3 - 6 Standard drinks or equivalent per week     Comment: 5 glasses of  wine a week.  . Drug use: No  . Sexual activity: Yes    Partners: Male    Birth control/ protection: None   Other Topics Concern  . Not on file   Social History Narrative   Married. Education: [employer]. Exercise: walking, yoga, and bicycling daily for 1-2 hours. Lives alone, works as psychotherapist    Outpatient Medications Prior to Visit  Medication Sig Dispense Refill  . aspirin 81 MG tablet Take 81 mg by mouth daily.    . cholecalciferol (VITAMIN D) 1000 UNITS tablet Take 1,000 Units by mouth daily.    . fish oil-omega-3 fatty acids 1000 MG capsule Take 2 g by mouth daily.    . meloxicam (MOBIC) 15 MG tablet Take 1 tablet (15 mg total) by mouth daily. 30 tablet 0  . triamcinolone cream (KENALOG) 0.1 % Apply 1 application topically 3 (three) times daily. 85.2 g 1   No facility-administered medications prior to visit.     Allergies  Allergen Reactions  . Erythromycin     Patient states that she passed out while using this medication    Review of Systems  Constitutional: Positive for malaise/fatigue. Negative for chills and fever.  HENT: Negative for  congestion, hearing loss and sore throat.   Eyes: Negative for discharge.  Respiratory: Negative for cough, sputum production and shortness of breath.   Cardiovascular: Negative for chest pain, palpitations and leg swelling.  Gastrointestinal: Negative for abdominal pain, blood in stool, constipation, diarrhea, heartburn, nausea and vomiting.  Genitourinary: Negative for dysuria, frequency, hematuria and urgency.  Musculoskeletal: Negative for back pain, falls and myalgias.  Skin: Negative for rash.  Neurological: Negative for dizziness, sensory change, loss of consciousness, weakness and headaches.  Endo/Heme/Allergies: Negative for environmental allergies. Does not bruise/bleed easily.  Psychiatric/Behavioral: Negative for depression and suicidal ideas. The patient is not nervous/anxious and does not have insomnia.          Objective:    Physical Exam  Constitutional: She is oriented to person, place, and time. She appears well-developed and well-nourished. No distress.  HENT:  Head: Normocephalic and atraumatic.  Eyes: Conjunctivae are normal.  Neck: Neck supple. No thyromegaly present.  Firm, fixed lymph node submandibular, nontender  Cardiovascular: Normal rate, regular rhythm and normal heart sounds.   No murmur heard. Pulmonary/Chest: Effort normal and breath sounds normal. No respiratory distress.  Abdominal: Soft. Bowel sounds are normal. She exhibits no distension and no mass. There is no tenderness.  Musculoskeletal: She exhibits no edema.  Lymphadenopathy:    She has no cervical adenopathy.  Neurological: She is alert and oriented to person, place, and time.  Skin: Skin is warm and dry.  Psychiatric: She has a normal mood and affect. Her behavior is normal.    BP 132/82 (BP Location: Left Arm, Patient Position: Sitting, Cuff Size: Normal)   Pulse 64   Temp 98.5 F (36.9 C) (Oral)   Ht 5\' 8"  (1.727 m)   Wt 174 lb 2 oz (79 kg)   SpO2 97%   BMI 26.48 kg/m  Wt Readings from Last 3 Encounters:  01/27/16 174 lb 2 oz (79 kg)  12/31/15 177 lb 3.2 oz (80.4 kg)  08/06/15 176 lb (79.8 kg)     Lab Results  Component Value Date   WBC 5.9 01/27/2016   HGB 14.2 01/27/2016   HCT 41.8 01/27/2016   PLT 282.0 01/27/2016   GLUCOSE 87 01/27/2016   CHOL 227 (H) 01/27/2016   TRIG 65.0 01/27/2016   HDL 77.80 01/27/2016   LDLCALC 137 (H) 01/27/2016   ALT 16 01/27/2016   AST 17 01/27/2016   NA 138 01/27/2016   K 4.6 01/27/2016   CL 102 01/27/2016   CREATININE 0.75 01/27/2016   BUN 13 01/27/2016   CO2 30 01/27/2016   TSH 1.52 01/27/2016    Lab Results  Component Value Date   TSH 1.52 01/27/2016   Lab Results  Component Value Date   WBC 5.9 01/27/2016   HGB 14.2 01/27/2016   HCT 41.8 01/27/2016   MCV 89.7 01/27/2016   PLT 282.0 01/27/2016   Lab Results  Component Value Date   NA  138 01/27/2016   K 4.6 01/27/2016   CO2 30 01/27/2016   GLUCOSE 87 01/27/2016   BUN 13 01/27/2016   CREATININE 0.75 01/27/2016   BILITOT 0.8 01/27/2016   ALKPHOS 83 01/27/2016   AST 17 01/27/2016   ALT 16 01/27/2016   PROT 7.4 01/27/2016   ALBUMIN 4.4 01/27/2016   CALCIUM 9.4 01/27/2016   GFR 81.23 01/27/2016   Lab Results  Component Value Date   CHOL 227 (H) 01/27/2016   Lab Results  Component Value Date   HDL 77.80 01/27/2016  Lab Results  Component Value Date   LDLCALC 137 (H) 01/27/2016   Lab Results  Component Value Date   TRIG 65.0 01/27/2016   Lab Results  Component Value Date   CHOLHDL 3 01/27/2016   No results found for: HGBA1C     Assessment & Plan:   Problem List Items Addressed This Visit    Hyperlipidemia, mixed    Encouraged heart healthy diet, increase exercise, avoid trans fats, consider a krill oil cap daily      Relevant Orders   TSH (Completed)   Lipid panel (Completed)   History of chicken pox    Took Zostavax      Vitamin D deficiency    Check level today, take a daily supplements. Labs reveal deficiency. Start on Vitamin D 50000 IU caps, 1 cap po weekly x 12 weeks. Disp #4 with 4 rf. Also take daily Vitamin D over the counter. If already taking a daily supplement increase by 1000 IU daily and if not start Vitamin D 2000 IU daily.       Relevant Orders   Vitamin D (25 hydroxy) (Completed)   Lymphadenopathy    Single lesion under chin, stable for over a month. Imaging was inconclusive will refer to general surgery for consideration of biopsy. Tick studies all negative      Relevant Orders   Ehrlichia antibody panel (Completed)   Rocky mtn spotted fvr abs pnl(IgG+IgM) (Completed)   Lyme Disease Abs IgG, IgM, IFA, CSF (Completed)   Ambulatory referral to General Surgery   History of PCOS   Relevant Orders   TSH (Completed)   Grief reaction - Primary    Husband very ill with Glioblastoma. She feels she is managing adequately  most days but feels overwhelmed on others. She declines meds for now but will let us know if she changes her mind.       Preventative health care    Patient encouraged to maintain heart healthy diet, regular exercise, adequate sleep. Consider daily probiotics. Take medications as prescribed. Given and reviewed copy of ACP documents from [employer] and encouraged to complete and return      Relevant Orders   TSH (Completed)   CBC (Completed)   Comprehensive metabolic panel (Completed)   Anemia   Relevant Orders   CBC (Completed)   History of colonic polyps    Follows with Dr Wallis Mart and last colonoscopy was at age 51      Actinic keratoses    She has a dermatologist and agrees to call for appt.        Other Visit Diagnoses    Encounter for immunization       Relevant Orders   Flu vaccine HIGH DOSE PF (Completed)   Lipid panel (Completed)   Comprehensive metabolic panel (Completed)   Ehrlichia antibody panel (Completed)   Rocky mtn spotted fvr abs pnl(IgG+IgM) (Completed)   Lyme Disease Abs IgG, IgM, IFA, CSF (Completed)   Vitamin D (25 hydroxy) (Completed)   Estrogen deficiency       Relevant Orders   DG Bone Density (Completed)      I am having Ms. Burgo maintain her fish oil-omega-3 fatty acids, cholecalciferol, aspirin, triamcinolone cream, and meloxicam.  No orders of the defined types were placed in this encounter.    Penni Homans, MD

## 2016-01-31 NOTE — Assessment & Plan Note (Signed)
Follows with Dr Wallis Mart and last colonoscopy was at age 70

## 2016-02-02 ENCOUNTER — Telehealth: Payer: Self-pay | Admitting: Family Medicine

## 2016-02-02 MED ORDER — VITAMIN D3 1.25 MG (50000 UT) PO CAPS: 1.0000 | ORAL_CAPSULE | ORAL | 4 refills | Status: DC

## 2016-02-02 NOTE — Telephone Encounter (Signed)
Patient informed of results/instrucitons Sent in prescription strength as directed and pt. Will be taking 2000 units daily as well.

## 2016-02-02 NOTE — Telephone Encounter (Signed)
-----   Message from [name], MD sent at 01/31/2016  6:16 PM EDT ----- Vitamin D low. Labs reveal deficiency. Start on Vitamin D 50000 IU caps, 1 cap po weekly x 12 weeks. Disp #4 with 4 rf. Also take daily Vitamin D over the counter. If already taking a daily supplement increase by 1000 IU daily and if not start Vitamin D 2000 IU daily.

## 2016-02-09 DIAGNOSIS — R221 Localized swelling, mass and lump, neck: Secondary | ICD-10-CM | POA: Diagnosis not present

## 2016-02-17 ENCOUNTER — Encounter: Payer: Self-pay | Admitting: Family Medicine

## 2016-03-03 DIAGNOSIS — R590 Localized enlarged lymph nodes: Secondary | ICD-10-CM | POA: Diagnosis not present

## 2016-03-22 DIAGNOSIS — R590 Localized enlarged lymph nodes: Secondary | ICD-10-CM | POA: Diagnosis not present

## 2016-04-07 ENCOUNTER — Other Ambulatory Visit: Payer: Self-pay | Admitting: Otolaryngology

## 2016-04-07 NOTE — Pre-Procedure Instructions (Signed)
Lindsay Harrison  04/07/2016      [facility] - [place], [place] - [address] [place] [address] Phone: [phone] Fax: [phone]    Your procedure is scheduled on Friday, December 15th, 2017.  Report to [facility] Admitting at 7:00 A.M.   Call this number if you have problems the morning of surgery:  [phone]   Remember:  Do not eat food or drink liquids after midnight.   Take these medicines the morning of surgery with A SIP OF WATER: None.  7 days prior to surgery, stop taking: Aspirin, NSAIDS, Aleve, Naproxen, Ibuprofen, Advil, Motrin, BC's, Goody's, Fish oil, all herbal medications, and all vitamins.    Do not wear jewelry, make-up or nail polish.  Do not wear lotions, powders, or perfumes, or deoderant.  Do not shave 48 hours prior to surgery.    Do not bring valuables to the hospital.  [facility] is not responsible for any belongings or valuables.  Contacts, dentures or bridgework may not be worn into surgery.  Leave your suitcase in the car.  After surgery it may be brought to your room.  For patients admitted to the hospital, discharge time will be determined by your treatment team.  Patients discharged the day of surgery will not be allowed to drive home.   Special instructions:  Preparing for Surgery.   [facility]- Preparing For Surgery  Before surgery, you can play an important role. Because skin is not sterile, your skin needs to be as free of germs as possible. You can reduce the number of germs on your skin by washing with CHG (chlorahexidine gluconate) Soap before surgery.  CHG is an antiseptic cleaner which kills germs and bonds with the skin to continue killing germs even after washing.  Please do not use if you have an allergy to CHG or antibacterial soaps. If your skin becomes reddened/irritated stop using the CHG.  Do not shave (including legs  and underarms) for at least 48 hours prior to first CHG shower. It is OK to shave your face.  Please follow these instructions carefully.   1. Shower the NIGHT BEFORE SURGERY and the MORNING OF SURGERY with CHG.   2. If you chose to wash your hair, wash your hair first as usual with your normal shampoo.  3. After you shampoo, rinse your hair and body thoroughly to remove the shampoo.  4. Use CHG as you would any other liquid soap. You can apply CHG directly to the skin and wash gently with a scrungie or a clean washcloth.   5. Apply the CHG Soap to your body ONLY FROM THE NECK DOWN.  Do not use on open wounds or open sores. Avoid contact with your eyes, ears, mouth and genitals (private parts). Wash genitals (private parts) with your normal soap.  6. Wash thoroughly, paying special attention to the area where your surgery will be performed.  7. Thoroughly rinse your body with warm water from the neck down.  8. DO NOT shower/wash with your normal soap after using and rinsing off the CHG Soap.  9. Pat yourself dry with a CLEAN TOWEL.   10. Wear CLEAN PAJAMAS   11. Place CLEAN SHEETS on your bed the night of your first shower and DO NOT SLEEP WITH PETS.  Day of Surgery: Do not apply any deodorants/lotions. Please wear clean clothes to the hospital/surgery center.  Please read over the following fact sheets that you were given.

## 2016-04-08 ENCOUNTER — Encounter (HOSPITAL_COMMUNITY)
Admission: RE | Admit: 2016-04-08 | Discharge: 2016-04-08 | Disposition: A | Discharge status: Home / Self Care | Payer: Medicare Other | Source: Ambulatory Visit | Attending: Otolaryngology | Admitting: Otolaryngology

## 2016-04-08 ENCOUNTER — Encounter (HOSPITAL_COMMUNITY): Payer: Self-pay

## 2016-04-08 DIAGNOSIS — Z01812 Encounter for preprocedural laboratory examination: Secondary | ICD-10-CM | POA: Diagnosis not present

## 2016-04-08 HISTORY — DX: Presence of spectacles and contact lenses: Z97.3

## 2016-04-08 HISTORY — DX: Unspecified osteoarthritis, unspecified site: M19.90

## 2016-04-08 LAB — CBC
HCT: 41.9 % (ref 36.0–46.0)
Hemoglobin: 14.2 g/dL (ref 12.0–15.0)
MCH: 30.5 pg (ref 26.0–34.0)
MCHC: 33.9 g/dL (ref 30.0–36.0)
MCV: 90.1 fL (ref 78.0–100.0)
Platelets: 296 10*3/uL (ref 150–400)
RBC: 4.65 MIL/uL (ref 3.87–5.11)
RDW: 13.1 % (ref 11.5–15.5)
WBC: 5.7 10*3/uL (ref 4.0–10.5)

## 2016-04-08 NOTE — Progress Notes (Signed)
PCP - Dr. Penni Homans Cardiologist - denies  EKG/CXR - denies Echo/stress test/cardiac cath - denies  Patient denies chest pain and shortness of breath at PAT appointment.

## 2016-04-09 DIAGNOSIS — R590 Localized enlarged lymph nodes: Secondary | ICD-10-CM | POA: Diagnosis not present

## 2016-04-15 MED ORDER — DEXAMETHASONE SODIUM PHOSPHATE 10 MG/ML IJ SOLN
10.0000 mg | Freq: Once | INTRAMUSCULAR | Status: AC
  Administered 2016-04-16: 10 mg via INTRAVENOUS
  Filled 2016-04-15: qty 1

## 2016-04-15 MED ORDER — CEFAZOLIN SODIUM-DEXTROSE 2-4 GM/100ML-% IV SOLN
2.0000 g | INTRAVENOUS | Status: AC
  Administered 2016-04-16: 2 g via INTRAVENOUS
  Filled 2016-04-15: qty 100

## 2016-04-16 ENCOUNTER — Ambulatory Visit (HOSPITAL_COMMUNITY): Payer: Medicare Other | Admitting: Anesthesiology

## 2016-04-16 ENCOUNTER — Encounter (HOSPITAL_COMMUNITY): Payer: Self-pay | Admitting: *Deleted

## 2016-04-16 ENCOUNTER — Encounter (HOSPITAL_COMMUNITY)
Admission: RE | Disposition: A | Discharge status: Home / Self Care | Payer: Self-pay | Source: Ambulatory Visit | Attending: Otolaryngology

## 2016-04-16 ENCOUNTER — Ambulatory Visit (HOSPITAL_COMMUNITY)
Admission: RE | Admit: 2016-04-16 | Discharge: 2016-04-16 | Disposition: A | Discharge status: Home / Self Care | Payer: Medicare Other | Source: Ambulatory Visit | Attending: Otolaryngology | Admitting: Otolaryngology

## 2016-04-16 DIAGNOSIS — C8331 Diffuse large B-cell lymphoma, lymph nodes of head, face, and neck: Secondary | ICD-10-CM | POA: Insufficient documentation

## 2016-04-16 DIAGNOSIS — Z8249 Family history of ischemic heart disease and other diseases of the circulatory system: Secondary | ICD-10-CM | POA: Diagnosis not present

## 2016-04-16 DIAGNOSIS — M19042 Primary osteoarthritis, left hand: Secondary | ICD-10-CM | POA: Diagnosis not present

## 2016-04-16 DIAGNOSIS — Z825 Family history of asthma and other chronic lower respiratory diseases: Secondary | ICD-10-CM | POA: Insufficient documentation

## 2016-04-16 DIAGNOSIS — Z79899 Other long term (current) drug therapy: Secondary | ICD-10-CM | POA: Diagnosis not present

## 2016-04-16 DIAGNOSIS — E559 Vitamin D deficiency, unspecified: Secondary | ICD-10-CM | POA: Diagnosis not present

## 2016-04-16 DIAGNOSIS — Z823 Family history of stroke: Secondary | ICD-10-CM | POA: Insufficient documentation

## 2016-04-16 DIAGNOSIS — C8221 Follicular lymphoma grade III, unspecified, lymph nodes of head, face, and neck: Secondary | ICD-10-CM | POA: Diagnosis not present

## 2016-04-16 DIAGNOSIS — Z7982 Long term (current) use of aspirin: Secondary | ICD-10-CM | POA: Insufficient documentation

## 2016-04-16 DIAGNOSIS — Z8261 Family history of arthritis: Secondary | ICD-10-CM | POA: Diagnosis not present

## 2016-04-16 DIAGNOSIS — E782 Mixed hyperlipidemia: Secondary | ICD-10-CM | POA: Insufficient documentation

## 2016-04-16 DIAGNOSIS — Z87892 Personal history of anaphylaxis: Secondary | ICD-10-CM | POA: Diagnosis not present

## 2016-04-16 DIAGNOSIS — Z881 Allergy status to other antibiotic agents status: Secondary | ICD-10-CM | POA: Diagnosis not present

## 2016-04-16 DIAGNOSIS — E282 Polycystic ovarian syndrome: Secondary | ICD-10-CM | POA: Diagnosis not present

## 2016-04-16 DIAGNOSIS — D649 Anemia, unspecified: Secondary | ICD-10-CM | POA: Diagnosis not present

## 2016-04-16 DIAGNOSIS — Z91048 Other nonmedicinal substance allergy status: Secondary | ICD-10-CM | POA: Insufficient documentation

## 2016-04-16 DIAGNOSIS — Q892 Congenital malformations of other endocrine glands: Secondary | ICD-10-CM | POA: Insufficient documentation

## 2016-04-16 DIAGNOSIS — C8281 Other types of follicular lymphoma, lymph nodes of head, face, and neck: Secondary | ICD-10-CM | POA: Insufficient documentation

## 2016-04-16 DIAGNOSIS — E785 Hyperlipidemia, unspecified: Secondary | ICD-10-CM | POA: Diagnosis not present

## 2016-04-16 DIAGNOSIS — R221 Localized swelling, mass and lump, neck: Secondary | ICD-10-CM | POA: Diagnosis present

## 2016-04-16 HISTORY — PX: THYROGLOSSAL DUCT CYST: SHX297

## 2016-04-16 SURGERY — EXCISION, THYROGLOSSAL DUCT CYST
Anesthesia: General | Site: Neck

## 2016-04-16 MED ORDER — ONDANSETRON HCL 4 MG/2ML IJ SOLN
INTRAMUSCULAR | Status: AC
  Filled 2016-04-16: qty 2

## 2016-04-16 MED ORDER — CHLORHEXIDINE GLUCONATE CLOTH 2 % EX PADS: 6.0000 | MEDICATED_PAD | Freq: Once | CUTANEOUS | Status: DC

## 2016-04-16 MED ORDER — DEXAMETHASONE SODIUM PHOSPHATE 10 MG/ML IJ SOLN
INTRAMUSCULAR | Status: AC
  Filled 2016-04-16: qty 1

## 2016-04-16 MED ORDER — LIDOCAINE-EPINEPHRINE (PF) 1 %-1:200000 IJ SOLN
INTRAMUSCULAR | Status: DC | PRN
  Administered 2016-04-16: 1 mL

## 2016-04-16 MED ORDER — 0.9 % SODIUM CHLORIDE (POUR BTL) OPTIME
TOPICAL | Status: DC | PRN
  Administered 2016-04-16: 1000 mL

## 2016-04-16 MED ORDER — MEPERIDINE HCL 25 MG/ML IJ SOLN: 6.2500 mg | INTRAMUSCULAR | Status: DC | PRN

## 2016-04-16 MED ORDER — FENTANYL CITRATE (PF) 100 MCG/2ML IJ SOLN
INTRAMUSCULAR | Status: DC | PRN
  Administered 2016-04-16 (×2): 50 ug via INTRAVENOUS

## 2016-04-16 MED ORDER — PROPOFOL 10 MG/ML IV BOLUS
INTRAVENOUS | Status: DC | PRN
  Administered 2016-04-16: 160 mg via INTRAVENOUS

## 2016-04-16 MED ORDER — FENTANYL CITRATE (PF) 100 MCG/2ML IJ SOLN
INTRAMUSCULAR | Status: AC
  Filled 2016-04-16: qty 2

## 2016-04-16 MED ORDER — LIDOCAINE-EPINEPHRINE (PF) 1 %-1:200000 IJ SOLN
INTRAMUSCULAR | Status: AC
  Filled 2016-04-16: qty 30

## 2016-04-16 MED ORDER — HYDROCODONE-ACETAMINOPHEN 5-325 MG PO TABS: 1.0000 | ORAL_TABLET | Freq: Four times a day (QID) | ORAL | 0 refills | Status: DC | PRN

## 2016-04-16 MED ORDER — LACTATED RINGERS IV SOLN: INTRAVENOUS | Status: DC

## 2016-04-16 MED ORDER — ROCURONIUM BROMIDE 50 MG/5ML IV SOSY
PREFILLED_SYRINGE | INTRAVENOUS | Status: AC
  Filled 2016-04-16: qty 5

## 2016-04-16 MED ORDER — FENTANYL CITRATE (PF) 100 MCG/2ML IJ SOLN: 25.0000 ug | INTRAMUSCULAR | Status: DC | PRN

## 2016-04-16 MED ORDER — SUGAMMADEX SODIUM 200 MG/2ML IV SOLN
INTRAVENOUS | Status: AC
  Filled 2016-04-16: qty 2

## 2016-04-16 MED ORDER — ROCURONIUM BROMIDE 100 MG/10ML IV SOLN
INTRAVENOUS | Status: DC | PRN
  Administered 2016-04-16: 50 mg via INTRAVENOUS

## 2016-04-16 MED ORDER — LIDOCAINE HCL (PF) 1 % IJ SOLN
INTRAMUSCULAR | Status: AC
  Filled 2016-04-16: qty 30

## 2016-04-16 MED ORDER — LIDOCAINE 2% (20 MG/ML) 5 ML SYRINGE
INTRAMUSCULAR | Status: AC
  Filled 2016-04-16: qty 5

## 2016-04-16 MED ORDER — EPHEDRINE SULFATE 50 MG/ML IJ SOLN
INTRAMUSCULAR | Status: DC | PRN
  Administered 2016-04-16 (×2): 10 mg via INTRAVENOUS

## 2016-04-16 MED ORDER — LIDOCAINE HCL (CARDIAC) 20 MG/ML IV SOLN
INTRAVENOUS | Status: DC | PRN
  Administered 2016-04-16: 60 mg via INTRAVENOUS

## 2016-04-16 MED ORDER — MIDAZOLAM HCL 2 MG/2ML IJ SOLN
INTRAMUSCULAR | Status: AC
  Filled 2016-04-16: qty 2

## 2016-04-16 MED ORDER — EPHEDRINE 5 MG/ML INJ
INTRAVENOUS | Status: AC
  Filled 2016-04-16: qty 10

## 2016-04-16 MED ORDER — SUGAMMADEX SODIUM 200 MG/2ML IV SOLN
INTRAVENOUS | Status: DC | PRN
  Administered 2016-04-16: 165 mg via INTRAVENOUS

## 2016-04-16 MED ORDER — LACTATED RINGERS IV SOLN
INTRAVENOUS | Status: DC
  Administered 2016-04-16: 08:00:00 via INTRAVENOUS

## 2016-04-16 MED ORDER — PROMETHAZINE HCL 25 MG/ML IJ SOLN: 6.2500 mg | INTRAMUSCULAR | Status: DC | PRN

## 2016-04-16 MED ORDER — PROPOFOL 10 MG/ML IV BOLUS
INTRAVENOUS | Status: AC
  Filled 2016-04-16: qty 20

## 2016-04-16 MED ORDER — ONDANSETRON HCL 4 MG/2ML IJ SOLN
INTRAMUSCULAR | Status: DC | PRN
  Administered 2016-04-16: 4 mg via INTRAVENOUS

## 2016-04-16 SURGICAL SUPPLY — 38 items
ADH SKN CLS APL DERMABOND .7 (GAUZE/BANDAGES/DRESSINGS) ×1
BLADE SURG 15 STRL LF DISP TIS (BLADE) IMPLANT
BLADE SURG 15 STRL SS (BLADE) ×3
CANISTER SUCTION 2500CC (MISCELLANEOUS) ×3 IMPLANT
CLEANER TIP ELECTROSURG 2X2 (MISCELLANEOUS) ×3 IMPLANT
CONT SPEC 4OZ CLIKSEAL STRL BL (MISCELLANEOUS) ×2 IMPLANT
CORDS BIPOLAR (ELECTRODE) ×3 IMPLANT
COVER SURGICAL LIGHT HANDLE (MISCELLANEOUS) ×3 IMPLANT
CRADLE DONUT ADULT HEAD (MISCELLANEOUS) ×3 IMPLANT
DERMABOND ADVANCED (GAUZE/BANDAGES/DRESSINGS) ×2
DERMABOND ADVANCED .7 DNX12 (GAUZE/BANDAGES/DRESSINGS) ×1 IMPLANT
DRAIN JACKSON RD 7FR 3/32 (WOUND CARE) ×1 IMPLANT
DRAPE PROXIMA HALF (DRAPES) ×3 IMPLANT
ELECT COATED BLADE 2.86 ST (ELECTRODE) ×3 IMPLANT
ELECT REM PT RETURN 9FT ADLT (ELECTROSURGICAL) ×3
ELECTRODE REM PT RTRN 9FT ADLT (ELECTROSURGICAL) ×1 IMPLANT
EVACUATOR SILICONE 100CC (DRAIN) ×3 IMPLANT
GAUZE SPONGE 4X4 16PLY XRAY LF (GAUZE/BANDAGES/DRESSINGS) ×3 IMPLANT
GLOVE BIOGEL M 7.0 STRL (GLOVE) ×3 IMPLANT
GOWN STRL REUS W/ TWL LRG LVL3 (GOWN DISPOSABLE) ×3 IMPLANT
GOWN STRL REUS W/TWL LRG LVL3 (GOWN DISPOSABLE) ×12
KIT BASIN OR (CUSTOM PROCEDURE TRAY) ×3 IMPLANT
KIT ROOM TURNOVER OR (KITS) ×3 IMPLANT
MARKER SKIN DUAL TIP RULER LAB (MISCELLANEOUS) ×2 IMPLANT
NDL HYPO 25GX1X1/2 BEV (NEEDLE) IMPLANT
NEEDLE HYPO 25GX1X1/2 BEV (NEEDLE) ×3 IMPLANT
NS IRRIG 1000ML POUR BTL (IV SOLUTION) ×3 IMPLANT
PAD ARMBOARD 7.5X6 YLW CONV (MISCELLANEOUS) ×3 IMPLANT
PENCIL BUTTON HOLSTER BLD 10FT (ELECTRODE) ×3 IMPLANT
SPONGE INTESTINAL PEANUT (DISPOSABLE) ×2 IMPLANT
STAPLER VISISTAT 35W (STAPLE) ×3 IMPLANT
SUT ETHILON 3 0 FSL (SUTURE) ×3 IMPLANT
SUT SILK 3 0 REEL (SUTURE) ×3 IMPLANT
SUT SILK 4 0 REEL (SUTURE) IMPLANT
SUT VIC AB 3-0 FS2 27 (SUTURE) ×3 IMPLANT
SUT VICRYL 4-0 PS2 18IN ABS (SUTURE) ×3 IMPLANT
TOWEL OR 17X24 6PK STRL BLUE (TOWEL DISPOSABLE) ×3 IMPLANT
TRAY ENT MC OR (CUSTOM PROCEDURE TRAY) ×3 IMPLANT

## 2016-04-16 NOTE — Anesthesia Procedure Notes (Signed)
Procedure Name: Intubation Date/Time: 04/16/2016 9:31 AM Performed by: Terrill Mohr Pre-anesthesia Checklist: Patient identified, Emergency Drugs available, Suction available and Patient being monitored Patient Re-evaluated:Patient Re-evaluated prior to inductionOxygen Delivery Method: Circle system utilized Preoxygenation: Pre-oxygenation with 100% oxygen Intubation Type: IV induction Ventilation: Mask ventilation without difficulty Laryngoscope Size: Mac and 3 Grade View: Grade I Tube type: Oral Tube size: 7.5 mm Number of attempts: 1 Airway Equipment and Method: Stylet Placement Confirmation: ETT inserted through vocal cords under direct vision,  positive ETCO2 and breath sounds checked- equal and bilateral Secured at: 22 (cm at teeth) cm Tube secured with: Tape Dental Injury: Teeth and Oropharynx as per pre-operative assessment

## 2016-04-16 NOTE — H&P (Signed)
Lindsay Harrison is an 70 y.o. female.   Chief Complaint: Submental neck mass HPI: asymptomatic anterior submental neck mass  Past Medical History:  Diagnosis Date  . Anemia    h/o iron deficiency secondary to heavy menses  . Arthritis    left hand index finger  . Grief reaction 01/27/2016  . H/O measles    as a child  . History of chicken pox    as a child  . History of PCOS   . Hyperlipidemia, mixed 01/27/2016  . Lymphadenopathy   . Preventative health care 01/27/2016  . Vitamin D deficiency   . Wears glasses     Past Surgical History:  Procedure Laterality Date  . COLONOSCOPY    . SKIN BIOPSY Left    face    Family History  Problem Relation Age of Onset  . Arthritis Mother     rheumatoid  . Heart disease Father     CHF  . COPD Sister     Emphysema, h/o cigarettes  . Other Sister     pituatary tumor  . Arthritis Sister   . Obesity Sister   . Cancer Paternal Grandfather     cancer  . Stroke Maternal Aunt    Social History:  reports that she has never smoked. She has never used smokeless tobacco. She reports that she drinks about 1.8 - 3.6 oz of alcohol per week . She reports that she does not use drugs.  Allergies:  Allergies  Allergen Reactions  . Erythromycin Other (See Comments)    Patient states that she passed out while using this medication ? SYNCOPE ?  [name] Other Anaphylaxis    # # # CATS # # #    Medications Prior to Admission  Medication Sig Dispense Refill  . aspirin 81 MG tablet Take 81 mg by mouth daily. Has stopped prior to procedure    . Calcium-Magnesium-Vitamin D (CALCIUM MAGNESIUM PO) Take 1 tablet by mouth daily.    . Cholecalciferol (VITAMIN D) 2000 units CAPS Take 2,000 Units by mouth daily.    . Cholecalciferol (VITAMIN D3) 50000 units CAPS Take 1 capsule by mouth once a week. (Patient taking differently: Take 1 capsule by mouth once a week. Friday) 4 capsule 4  . Omega-3 Fatty Acids (OMEGA-3 PO) Take 2 capsules by mouth daily. Omega 3  1280 mg    . meloxicam (MOBIC) 15 MG tablet Take 1 tablet (15 mg total) by mouth daily. (Patient not taking: Reported on 04/06/2016) 30 tablet 0  . triamcinolone cream (KENALOG) 0.1 % Apply 1 application topically 3 (three) times daily. (Patient not taking: Reported on 04/06/2016) 85.2 g 1    No results found for this or any previous visit (from the past 48 hour(s)). No results found.  Review of Systems  Constitutional: Negative.   HENT: Negative.   Respiratory: Negative.   Cardiovascular: Negative.     Blood pressure (!) 150/75, pulse 70, temperature 98.5 F (36.9 C), temperature source Oral, resp. rate 20, SpO2 100 %. Physical Exam  Constitutional: She appears well-developed and well-nourished.  Neck: Normal range of motion. Neck supple.  Submental neck mass  Cardiovascular: Normal rate.   Respiratory: Effort normal.     Assessment/Plan Adm for OP exc of Submental neck mass, possible Sistrunk procedure.  Elberta Lachapelle, MD 04/16/2016, 8:32 AM

## 2016-04-16 NOTE — Transfer of Care (Signed)
Immediate Anesthesia Transfer of Care Note  Patient: Lindsay Harrison  Procedure(s) Performed: Procedure(s): THYROGLOSSAL DUCT CYST excision (N/A)  Patient Location: PACU  Anesthesia Type:General  Level of Consciousness: awake, alert  and patient cooperative  Airway & Oxygen Therapy: Patient Spontanous Breathing and Patient connected to nasal cannula oxygen  Post-op Assessment: Report given to RN, Post -op Vital signs reviewed and stable and Patient moving all extremities  Post vital signs: Reviewed and stable  Last Vitals:  Vitals:   04/16/16 0747 04/16/16 1036  BP: (!) 150/75   Pulse: 70   Resp: 20   Temp: 36.9 C (P) 36.7 C    Last Pain:  Vitals:   04/16/16 0747  TempSrc: Oral         Complications: No apparent anesthesia complications

## 2016-04-16 NOTE — Anesthesia Preprocedure Evaluation (Addendum)
Anesthesia Evaluation  Patient identified by MRN, date of birth, ID band Patient awake    Reviewed: Allergy & Precautions, NPO status , Patient's Chart, lab work & pertinent test results  Airway Mallampati: II  TM Distance: >3 FB Neck ROM: Full    Dental  (+) Teeth Intact, Dental Advisory Given   Pulmonary neg pulmonary ROS,    breath sounds clear to auscultation       Cardiovascular negative cardio ROS   Rhythm:Regular Rate:Normal     Neuro/Psych negative neurological ROS  negative psych ROS   GI/Hepatic negative GI ROS, Neg liver ROS,   Endo/Other  negative endocrine ROS  Renal/GU negative Renal ROS  negative genitourinary   Musculoskeletal  (+) Arthritis , Osteoarthritis,    Abdominal   Peds negative pediatric ROS (+)  Hematology   Anesthesia Other Findings   Reproductive/Obstetrics negative OB ROS                            Anesthesia Physical Anesthesia Plan  ASA: II  Anesthesia Plan: General   Post-op Pain Management:    Induction: Intravenous  Airway Management Planned: Oral ETT  Additional Equipment:   Intra-op Plan:   Post-operative Plan: Extubation in OR  Informed Consent: I have reviewed the patients History and Physical, chart, labs and discussed the procedure including the risks, benefits and alternatives for the proposed anesthesia with the patient or authorized representative who has indicated his/her understanding and acceptance.   Dental advisory given  Plan Discussed with: CRNA  Anesthesia Plan Comments:         Anesthesia Quick Evaluation

## 2016-04-16 NOTE — Brief Op Note (Signed)
04/16/2016  10:25 AM  PATIENT:  Lindsay Harrison  70 y.o. female  PRE-OPERATIVE DIAGNOSIS:  THYROGLOSSAL duct cyst  POST-OPERATIVE DIAGNOSIS:  Submental Neck Mass  PROCEDURE:  Procedure(s): THYROGLOSSAL DUCT CYST excision (N/A)  SURGEON:  Surgeon(s) and Role:    * [name], MD - Primary  PHYSICIAN ASSISTANT: Etta Grandchild, PA  ASSISTANTS: none   ANESTHESIA:   general  EBL:  No intake/output data recorded.  BLOOD ADMINISTERED:none  DRAINS: none   LOCAL MEDICATIONS USED:  LIDOCAINE  and Amount: 1 ml  SPECIMEN:  Source of Specimen:  Anterior neck mass and Aspirate  DISPOSITION OF SPECIMEN:  PATHOLOGY  COUNTS:  YES  TOURNIQUET:  * No tourniquets in log *  DICTATION: .Other Dictation: Dictation Number [id number]  PLAN OF CARE: Discharge to home after PACU  PATIENT DISPOSITION:  PACU - hemodynamically stable.   Delay start of Pharmacological VTE agent (>24hrs) due to surgical blood loss or risk of bleeding: not applicable

## 2016-04-16 NOTE — Anesthesia Postprocedure Evaluation (Signed)
Anesthesia Post Note  Patient: Destyni Ogg  Procedure(s) Performed: Procedure(s) (LRB): THYROGLOSSAL DUCT CYST excision (N/A)  Patient location during evaluation: PACU Anesthesia Type: General Level of consciousness: awake and alert Pain management: pain level controlled Vital Signs Assessment: post-procedure vital signs reviewed and stable Respiratory status: spontaneous breathing, nonlabored ventilation, respiratory function stable and patient connected to nasal cannula oxygen Cardiovascular status: blood pressure returned to baseline and stable Postop Assessment: no signs of nausea or vomiting Anesthetic complications: no    Last Vitals:  Vitals:   04/16/16 1049 04/16/16 1115  BP: 121/69 117/72  Pulse: (!) 57 (!) 57  Resp: 18 16  Temp:      Last Pain:  Vitals:   04/16/16 1115  TempSrc:   PainSc: 0-No pain                 Effie Berkshire

## 2016-04-17 ENCOUNTER — Encounter (HOSPITAL_COMMUNITY): Payer: Self-pay | Admitting: Otolaryngology

## 2016-04-19 NOTE — Op Note (Signed)
NAME:  Escorcia, Xitlalic                  ACCOUNT NO.:  MEDICAL RECORD NO.:  [id number]  LOCATION:                                 FACILITY:  PHYSICIAN:  Joycelin Radloff L. Melda Mermelstein, M.D.DATE OF BIRTH:  07-27-1945  DATE OF PROCEDURE: DATE OF DISCHARGE:                              OPERATIVE REPORT   PREOPERATIVE DIAGNOSIS:  Anterior submental neck mass  POSTOPERATIVE DIAGNOSIS:  Anterior submental neck mass  INDICATION FOR SURGERY:  Anterior submental neck mass.  SURGICAL PROCEDURE:  Excision of deep neck mass.  ANESTHESIA:  General endotracheal.  ASSISTANT:  Jolene Provost, PAC.  COMPLICATIONS:  There were no complications.  ESTIMATED BLOOD LOSS:  Minimal.  CONDITION:  The patient was transferred from the operating room to the recovery room in stable condition.  BRIEF HISTORY:  The patient is a healthy 70 year old female, who was referred to our office for evaluation of a palpable mass in the anterior neck below the chin.  The patient was asymptomatic.  No symptoms of infection or tenderness.  Evaluation revealed a 2 cm nodular mass in the submental region.  CT scan was obtained which showed a solid-appearing mass overlying the hyoid bone consistent with lymph node enlargement or possible thyroglossal duct cyst.  Given the patient's history and findings, we discussed treatment options.  I recommended excision of the mass under general anesthesia.  The risks and benefits of the procedure were discussed in detail with the patient and her family, and they understood and agreed with our plan for surgery, which is scheduled on elective basis at [facility].  DESCRIPTION OF PROCEDURE:  The patient was brought to the operating room on April 16, 2016, and placed in supine position on the operating table.  General endotracheal anesthesia was established without difficulty.  When the patient adequately anesthetized, she was positioned on the operating table and prepped and  draped.  A surgical time-out was then performed with correct identification of the patient and the surgical procedure.  The patient was then injected with 1 mL of 2% lidocaine with 1:100,000 dilution of epinephrine, which was injected in a subcutaneous fashion in the skin overlying the palpable mass.  She was then prepped and draped in position, and the surgical procedure was begun.  With the patient prepared for surgery, a 2 cm horizontally oriented skin incision was created with a #15 scalpel.  This was carried through the skin and underlying subcutaneous tissue.  Subcutaneous fat was identified.  The mylohyoid muscles and anterior strap muscles were identified and lateralized entering the anterior compartment of the neck.  The mass was easily palpable, measured approximately 1.5 cm. This was dissected from the surrounding tissue and did not appear to be associated with the hyoid bone or any thyroid structures ruling out potential for thyroglossal duct cyst.  The mass was easily dissected from the surrounding tissues using Bovie electrocautery and was sent to Pathology for gross and microscopic evaluation.  The mass was sent fresh for possible lymphoma workup.  The patient's incision was then carefully irrigated with saline.  Hemostasis was maintained with bipolar cautery forceps.  The patient's incision was then closed in multiple layers beginning with  reapproximation of the deep muscular layer using interrupted 4-0 Vicryl.  The immediate subcutaneous layer was closed with interrupted 4-0 Vicryl and final skin edge was closed with Dermabond surgical glue.  The patient was then awakened from her anesthetic.  She was extubated and transferred from the operating room to the recovery room in stable condition.  There were no complications. The blood loss was minimal.          ______________________________ Early Chars. Wilburn Cornelia, M.D.     DLS/MEDQ  D:  [id number]  T:  04/17/2016  Job:   [id number]

## 2016-05-04 ENCOUNTER — Telehealth: Payer: Self-pay | Admitting: Hematology

## 2016-05-04 NOTE — Telephone Encounter (Signed)
Pt confirmed appt, reviewed address and verified demo and insurance.  Pt requested no letter mailed out.

## 2016-05-07 ENCOUNTER — Encounter: Payer: Self-pay | Admitting: Hematology

## 2016-05-07 ENCOUNTER — Telehealth: Payer: Self-pay | Admitting: Hematology

## 2016-05-07 ENCOUNTER — Ambulatory Visit (HOSPITAL_BASED_OUTPATIENT_CLINIC_OR_DEPARTMENT_OTHER): Payer: Medicare Other | Admitting: Hematology

## 2016-05-07 ENCOUNTER — Ambulatory Visit (HOSPITAL_BASED_OUTPATIENT_CLINIC_OR_DEPARTMENT_OTHER): Payer: Medicare Other

## 2016-05-07 VITALS — BP 159/67 | HR 74 | Temp 98.6°F | Resp 17 | Ht 68.5 in | Wt 173.6 lb

## 2016-05-07 DIAGNOSIS — F419 Anxiety disorder, unspecified: Secondary | ICD-10-CM

## 2016-05-07 DIAGNOSIS — R600 Localized edema: Secondary | ICD-10-CM

## 2016-05-07 DIAGNOSIS — C8331 Diffuse large B-cell lymphoma, lymph nodes of head, face, and neck: Secondary | ICD-10-CM | POA: Diagnosis not present

## 2016-05-07 DIAGNOSIS — Z01818 Encounter for other preprocedural examination: Secondary | ICD-10-CM

## 2016-05-07 LAB — CBC & DIFF AND RETIC
BASO%: 0.5 % (ref 0.0–2.0)
Basophils Absolute: 0 10*3/uL (ref 0.0–0.1)
EOS%: 1.4 % (ref 0.0–7.0)
Eosinophils Absolute: 0.1 10*3/uL (ref 0.0–0.5)
HCT: 40 % (ref 34.8–46.6)
HGB: 13.7 g/dL (ref 11.6–15.9)
Immature Retic Fract: 0.6 % — ABNORMAL LOW (ref 1.60–10.00)
LYMPH%: 29.9 % (ref 14.0–49.7)
MCH: 30.2 pg (ref 25.1–34.0)
MCHC: 34.3 g/dL (ref 31.5–36.0)
MCV: 88.3 fL (ref 79.5–101.0)
MONO#: 0.3 10*3/uL (ref 0.1–0.9)
MONO%: 5.4 % (ref 0.0–14.0)
NEUT#: 4 10*3/uL (ref 1.5–6.5)
NEUT%: 62.8 % (ref 38.4–76.8)
Platelets: 305 10*3/uL (ref 145–400)
RBC: 4.53 10*6/uL (ref 3.70–5.45)
RDW: 12.5 % (ref 11.2–14.5)
Retic %: 0.85 % (ref 0.70–2.10)
Retic Ct Abs: 38.51 10*3/uL (ref 33.70–90.70)
WBC: 6.3 10*3/uL (ref 3.9–10.3)
lymph#: 1.9 10*3/uL (ref 0.9–3.3)

## 2016-05-07 LAB — COMPREHENSIVE METABOLIC PANEL
ALT: 23 U/L (ref 0–55)
AST: 21 U/L (ref 5–34)
Albumin: 4.4 g/dL (ref 3.5–5.0)
Alkaline Phosphatase: 124 U/L (ref 40–150)
Anion Gap: 9 mEq/L (ref 3–11)
BUN: 14.1 mg/dL (ref 7.0–26.0)
CO2: 26 mEq/L (ref 22–29)
Calcium: 9.7 mg/dL (ref 8.4–10.4)
Chloride: 104 mEq/L (ref 98–109)
Creatinine: 0.8 mg/dL (ref 0.6–1.1)
EGFR: 81 mL/min/{1.73_m2} — ABNORMAL LOW (ref >90)
Glucose: 93 mg/dl (ref 70–140)
Potassium: 4.1 mEq/L (ref 3.5–5.1)
Sodium: 139 mEq/L (ref 136–145)
Total Bilirubin: 0.48 mg/dL (ref 0.20–1.20)
Total Protein: 7.5 g/dL (ref 6.4–8.3)

## 2016-05-07 LAB — LACTATE DEHYDROGENASE: LDH: 230 U/L (ref 125–245)

## 2016-05-07 MED ORDER — LORAZEPAM 0.5 MG PO TABS: 0.5000 mg | ORAL_TABLET | Freq: Three times a day (TID) | ORAL | 0 refills | Status: DC

## 2016-05-07 NOTE — Telephone Encounter (Signed)
Appointments scheduled per 1/5 LOS. Patient given AVS report and calendars with future scheduled appointments. °

## 2016-05-07 NOTE — Progress Notes (Signed)
[name]    HEMATOLOGY/ONCOLOGY CONSULTATION NOTE  Date of Service: 05/07/2016  Patient Care Team: Lindsay Lukes, MD as PCP - General (Family Medicine) Lindsay Harrison M.D. (ENT)  CHIEF COMPLAINTS/PURPOSE OF CONSULTATION:  Diffuse large B-cell lymphoma   HISTORY OF PRESENTING ILLNESS:   Lindsay Harrison is a wonderful 71 y.o. female who has been referred to [place] by Dr Lindsay Belfast MD for evaluation and management of  of newly diagnosed diffuse large B-cell lymphoma.  Patient is an overall very healthy 71 year old lady with a history of iron deficiency anemia in the past due to heavy menses, dyslipidemia, vitamin D deficiency, PCOS and some element of psychosocial stress related to eating the primary caregiver for her husband with GBM.  Patient reports noticing the nodule below her chin first at the end of July 2017. She had this evaluated by her primary care physician and was given a referral to [facility] [place] [facility] who then referred her to Dr. Lovenia Harrison for lymph node biopsy. CT of the maxillo facial region without contrast on 01/02/2016 showed a 9 mm left paramedian level I lymph node.  She had her lymph node biopsy on 04/16/2016 by Dr. 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  She was referred to [place] with these findings. Patient notes that she has not overtly noted any other enlarged lymph nodes. No chest pain or shortness of breath no abdominal pain or distention. No change in bowel habits. No headaches or focal neurological deficits. No overt fevers or chills. No significant drenching night sweats.   MEDICAL HISTORY:  Past Medical History:  Diagnosis Date  . Anemia    h/o iron deficiency secondary to heavy menses  . Arthritis    left hand index finger  . Grief reaction 01/27/2016  . H/O measles    as a child  . History of chicken pox    as a child  . History of PCOS   .  Hyperlipidemia, mixed 01/27/2016  . Lymphadenopathy   . Preventative health care 01/27/2016  . Vitamin D deficiency   . Wears glasses     SURGICAL HISTORY: Past Surgical History:  Procedure Laterality Date  . COLONOSCOPY    . SKIN BIOPSY Left    face  . THYROGLOSSAL DUCT CYST N/A 04/16/2016   Procedure: THYROGLOSSAL DUCT CYST excision;  Surgeon: Lindsay Belfast, MD;  Location: [facility] OR;  Service: ENT;  Laterality: N/A;    SOCIAL HISTORY: Social History   Social History  . Marital status: Married    Spouse name: N/A  . Number of children: N/A  . Years of education: N/A   Occupational History  . Psychotherapist    Social History Main Topics  . Smoking status: Never Smoker  . Smokeless tobacco: Never Used  . Alcohol use 1.8 - 3.6 oz/week    3 - 6 Standard drinks or equivalent per week     Comment: 5 glasses of wine a week.  . Drug use: No  . Sexual activity: Yes    Partners: Male    Birth control/ protection: None   Other Topics Concern  . Not on file   Social History Narrative   Married. Education: [employer]. Exercise: walking, yoga, and bicycling daily for 1-2 hours. Lives alone, works as Management consultant    FAMILY HISTORY: Family History  Problem Relation Age of Onset  . Arthritis Mother     rheumatoid  .  Heart disease Father     CHF  . COPD Sister     Emphysema, h/o cigarettes  . Other Sister     pituatary tumor  . Arthritis Sister   . Obesity Sister   . Cancer Paternal Grandfather     cancer  . Stroke Maternal Aunt     ALLERGIES:  is allergic to erythromycin and other.  MEDICATIONS:  Current Outpatient Prescriptions  Medication Sig Dispense Refill  . aspirin 81 MG tablet Take 81 mg by mouth daily. Has stopped prior to procedure    . Calcium-Magnesium-Vitamin D (CALCIUM MAGNESIUM PO) Take 1 tablet by mouth daily.    . Cholecalciferol (VITAMIN D) 2000 units CAPS Take 2,000 Units by mouth daily.    . Cholecalciferol (VITAMIN D3) 50000 units CAPS Take 1  capsule by mouth once a week. (Patient taking differently: Take 1 capsule by mouth once a week. Friday) 4 capsule 4  . HYDROcodone-acetaminophen (NORCO) 5-325 MG tablet Take 1-2 tablets by mouth every 6 (six) hours as needed for moderate pain. 20 tablet 0  . meloxicam (MOBIC) 15 MG tablet Take 1 tablet (15 mg total) by mouth daily. (Patient not taking: Reported on 04/06/2016) 30 tablet 0  . Omega-3 Fatty Acids (OMEGA-3 PO) Take 2 capsules by mouth daily. Omega 3 1280 mg    . triamcinolone cream (KENALOG) 0.1 % Apply 1 application topically 3 (three) times daily. (Patient not taking: Reported on 04/06/2016) 85.2 g 1   No current facility-administered medications for this visit.     REVIEW OF SYSTEMS:    10 Point review of Systems was done is negative except as noted above.  PHYSICAL EXAMINATION: ECOG PERFORMANCE STATUS: 0 - Asymptomatic  . Vitals:   05/07/16 1242  BP: (!) 159/67  Pulse: 74  Resp: 17  Temp: 98.6 F (37 C)   Filed Weights   05/07/16 1242  Weight: 173 lb 9.6 oz (78.7 kg)   .Body mass index is 26.01 kg/m.  GENERAL:alert, in no acute distress and comfortable SKIN: skin color, texture, turgor are normal, no rashes or significant lesions EYES: normal, conjunctiva are pink and non-injected, sclera clear OROPHARYNX:no exudate, no erythema and lips, buccal mucosa, and tongue normal  NECK: supple, no JVD, thyroid normal size, non-tender, without nodularity LYMPH:  no palpable lymphadenopathy in the cervical, axillary or inguinal LUNGS: clear to auscultation with normal respiratory effort HEART: regular rate & rhythm,  no murmurs and no lower extremity edema ABDOMEN: abdomen soft, non-tender, normoactive bowel sounds  Musculoskeletal: no cyanosis of digits and no clubbing  PSYCH: alert & oriented x 3 with fluent speech NEURO: no focal motor/sensory deficits  LABORATORY DATA:  I have reviewed the data as listed  . CBC Latest Ref Rng & Units 05/07/2016 04/08/2016  01/27/2016  WBC 3.9 - 10.3 10e3/uL 6.3 5.7 5.9  Hemoglobin 11.6 - 15.9 g/dL 13.7 14.2 14.2  Hematocrit 34.8 - 46.6 % 40.0 41.9 41.8  Platelets 145 - 400 10e3/uL 305 296 282.0    . CMP Latest Ref Rng & Units 05/07/2016 01/27/2016 02/05/2015  Glucose 70 - 140 mg/dl 93 87 93  BUN 7.0 - 26.0 mg/dL 14.1 13 15   Creatinine 0.6 - 1.1 mg/dL 0.8 0.75 0.73  Sodium 136 - 145 mEq/L 139 138 140  Potassium 3.5 - 5.1 mEq/L 4.1 4.6 4.6  Chloride 96 - 112 mEq/L - 102 102  CO2 22 - 29 mEq/L 26 30 27   Calcium 8.4 - 10.4 mg/dL 9.7 9.4 9.1  Total Protein  6.4 - 8.3 g/dL 7.5 7.4 6.5  Total Bilirubin 0.20 - 1.20 mg/dL 0.48 0.8 0.7  Alkaline Phos 40 - 150 U/L 124 83 86  AST 5 - 34 U/L 21 17 17   ALT 0 - 55 U/L 23 16 20    Component     Latest Ref Rng & Units 05/07/2016  Hepatitis B Surface Ag     Negative Negative  Hep B Core Ab, Tot     Negative Negative  Hep C Virus Ab     0.0 - 0.9 s/co ratio <0.1   . Lab Results  Component Value Date   LDH 230 05/07/2016        RADIOGRAPHIC STUDIES:   I have personally reviewed the radiological images as listed and agreed with the findings in the report. No results found.  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 Unknown stage at this time. Normal LDH. No constitutional symptoms. Patient has no significant medical comorbidities at baseline. PLAN -Discussed the diagnosis based on the pathology in details with the patient and her close friend. -We discussed the indicated initial workup and the fact that the treatment options would be determined by her stage of the disease. We discussed broadly the significance of the diagnosis and broad treatment options and prognostic elements. -We'll get a PET CT scan for initial staging of her newly diagnosed diffuse large B-cell lymphoma. -Echocardiogram in anticipation for likely anthracycline therapy    -Baseline labs were obtained including CBC, CMP which are within normal limits and an LDH which is within normal limits. -Hepatitis B and C profile -We discussed about the possible need of a port. -All of the patient's questions were answered in great details. -She will need chemotherapy counseling appointment after treatment decision is made -Given referral to [facility] for a second opinion as per patient's preference.  2) anxiety - diagnosis related. Has some concerns with anxiety and depression related to her husband's long battle with GBM. -Notes that she is managing it okay thus far. 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 -She notes that she does not need any medications at this time  Return to care with Dr. Irene Limbo in 7-8 days with PET/CT, labs and echocardiogram.  All of the patients questions were answered with apparent satisfaction. The patient knows to call the clinic with any problems, questions or concerns.  I spent 50 minutes counseling the patient face to face. The total time spent in the appointment was 60 minutes and more than 50% was on counseling and direct patient cares.    Sullivan Lone MD [place] AAHIVMS [facility] Hematology/Oncology Physician [facility]  (Office):       [phone] (Work cell):  [phone] (Fax):           [phone]  05/07/2016 12:56 PM

## 2016-05-08 LAB — HEPATITIS B SURFACE ANTIGEN: HBsAg Screen: NEGATIVE

## 2016-05-08 LAB — HEPATITIS B CORE ANTIBODY, TOTAL: Hep B Core Ab, Tot: NEGATIVE

## 2016-05-08 LAB — HEPATITIS C ANTIBODY: Hep C Virus Ab: <0.1 s/co ratio (ref 0.0–0.9)

## 2016-05-10 ENCOUNTER — Encounter: Payer: Self-pay | Admitting: Family Medicine

## 2016-05-10 ENCOUNTER — Encounter: Payer: Self-pay | Admitting: Hematology

## 2016-05-10 ENCOUNTER — Ambulatory Visit (INDEPENDENT_AMBULATORY_CARE_PROVIDER_SITE_OTHER): Payer: Medicare Other | Admitting: Family Medicine

## 2016-05-10 DIAGNOSIS — C8330 Diffuse large B-cell lymphoma, unspecified site: Secondary | ICD-10-CM

## 2016-05-10 DIAGNOSIS — F4320 Adjustment disorder, unspecified: Secondary | ICD-10-CM | POA: Diagnosis not present

## 2016-05-10 DIAGNOSIS — F4321 Adjustment disorder with depressed mood: Secondary | ICD-10-CM

## 2016-05-10 NOTE — Progress Notes (Signed)
Pre visit review using our clinic review tool, if applicable. No additional management support is needed unless otherwise documented below in the visit note. 

## 2016-05-10 NOTE — Patient Instructions (Addendum)
NOW supplements, elderberry daily, zinc daily, regular exercise, probiotics in [place]  . Non-Hodgkin Lymphoma, Adult Non-Hodgkin lymphoma (NHL) is a cancer of the lymphatic system. The lymphatic system is part of your body's defense (immune) system, which protects the body from infections, germs, and diseases. NHL affects a type of white blood cell called lymphocytes. There are different types of NHL. The kind of NHL you have depends on the type of cells it affects and how quickly it grows and spreads. What are the causes? The cause of NHL is not known. What increases the risk? Risks factors for NHL include:  Having a weak immune system, especially after an organ transplant.  Specific bacterial and viral infections including:  HIV.  Epstein-Barr virus.  Age. NHL is more common in people over the age of 51. What are the signs or symptoms? Symptoms of NHL may include:  Swelling of the lymph nodes in your neck, armpits, or groin.  Fever.  Chills.  Sweating without cause.  Itchy skin.  Fatigue.  Unexplained weight loss.  Coughing, breathing trouble, and chest pain.  Unexplained weakness.  Swelling of your face, legs, or stomach (abdomen).  Abdominal pain.  Nausea and vomiting.  Loss of appetite.  Headache.  Difficulty concentrating.  Changes in personality.  Seizures. How is this diagnosed? The diagnosis of NHL may include:  A physical exam and medical history.  Blood tests.  Chest X-ray.  Testing of body tissue (biopsy) of your lymph gland or bone marrow.  Different types of scans, including:  CT scans.  Positron emission tomography (PET) scan.  Gallium scan. How is this treated? NHL can be treated in different ways. This depends on your symptoms, the stage of NHL when you were first diagnosed, and the speed with which it is spreading. Treatment may include:  Radiation therapy. This uses radiation to destroy cancer cells.  Chemotherapy.  This uses medicine to destroy the cancer cells.  Biological therapy. This uses the body's immune system to destroy cancer cells.  Bone marrow transplantation.  Blood or platelet transfusions. This may be needed if your blood counts are low. Your cancer will be staged to determine its severity and extent. Your health care provider does staging to find out whether the cancer has spread, and if so, to what parts of your body. You may need to have more tests to determine the stage of your cancer. Follow these instructions at home:  Take medicines only as directed by your health care provider.  Plan rest periods when fatigued.  Keep all follow-up visits as directed by your health care provider. This is important. Contact a health care provider if:  You have new symptoms of NHL.  You have signs of infection. These might include:  Fever.  Chills.  Sore throat.  Headache.  Nausea.  Vomiting.  Diarrhea. This information is not intended to replace advice given to you by your health care provider. Make sure you discuss any questions you have with your health care provider. Document Released: 10/31/2006 Document Revised: 09/25/2015 Document Reviewed: 02/27/2015 Elsevier Interactive Patient Education  2017 [employer].

## 2016-05-11 ENCOUNTER — Encounter: Payer: Self-pay | Admitting: Family Medicine

## 2016-05-11 ENCOUNTER — Telehealth: Payer: Self-pay | Admitting: Hematology

## 2016-05-11 DIAGNOSIS — C8331 Diffuse large B-cell lymphoma, lymph nodes of head, face, and neck: Secondary | ICD-10-CM | POA: Diagnosis not present

## 2016-05-11 DIAGNOSIS — C8330 Diffuse large B-cell lymphoma, unspecified site: Secondary | ICD-10-CM | POA: Diagnosis not present

## 2016-05-11 DIAGNOSIS — R61 Generalized hyperhidrosis: Secondary | ICD-10-CM | POA: Diagnosis not present

## 2016-05-11 DIAGNOSIS — C8290 Follicular lymphoma, unspecified, unspecified site: Secondary | ICD-10-CM | POA: Diagnosis not present

## 2016-05-11 NOTE — Assessment & Plan Note (Signed)
Has established with Dr Irene Limbo at [facility] and has an appt for a second opinion next week at [facility]. She feels she will proceed with care locally but is going to confirm diagnosis. She is scheduled for a PET scan next week and is anxious to proceed with treatment. Discussed use of ginger for nausea during chemo and she will contact us if she needs anything else. F/u in 6 weeks

## 2016-05-11 NOTE — Assessment & Plan Note (Addendum)
Patient very tearful and under a great deal of stress due to a very sick husband in a nursing home that she cares for daily and now her own diagnosis of Lymphoma. After greater than 30 minute discussion and counseling during 35 minute visit decision was made not to prescribe any medications. She is considering some grief counseling and she does have some Lorazepam and Alprazolam if she needs it but she has not taken either. She will let us know if we can help in any way

## 2016-05-11 NOTE — Telephone Encounter (Signed)
PT APPT. WITH DR Dellis Filbert 05/11/16@9 :00

## 2016-05-11 NOTE — Progress Notes (Signed)
[name], female   DOB: Sep 22, 1945, 72 y.o.   MRN: [id number]   Subjective:    [name], female    DOB: 04/08/1946, 71 y.o.   MRN: [id number]  Chief Complaint  Patient presents with  . Follow-up    HPI Patient is in today for follPatient very tearful and under a great deal of stress due to a very sick husband in a nursing home that she cares for daily and now her own diagnosis of Lymphoma. After greater than 30 minute discussion and counseling during 35 minute visit decision was made not to prescribe any medications. She is considering some grief counseling and she does have some Lorazepam and Alprazolam if she needs it but she has not taken either. ow up. She is very tearful. Denies CP/palp/SOB/HA/congestion/fevers/GI or GU c/o. Taking meds as prescribed  Past Medical History:  Diagnosis Date  . Anemia    h/o iron deficiency secondary to heavy menses  . Arthritis    left hand index finger  . Diffuse large B cell lymphoma ([place])   . Grief reaction 01/27/2016  . H/O measles    as a child  . History of chicken pox    as a child  . History of PCOS   . Hyperlipidemia, mixed 01/27/2016  . Lymphadenopathy   . Preventative health care 01/27/2016  . Vitamin D deficiency   . Wears glasses     Past Surgical History:  Procedure Laterality Date  . COLONOSCOPY    . SKIN BIOPSY Left    face  . THYROGLOSSAL DUCT CYST N/A 04/16/2016   Procedure: THYROGLOSSAL DUCT CYST excision;  Surgeon: [name], MD;  Location: [facility] OR;  Service: ENT;  Laterality: N/A;    Family History  Problem Relation Age of Onset  . Arthritis Mother     rheumatoid  . Heart disease Father     CHF  . COPD Sister     Emphysema, h/o cigarettes  . Other Sister     pituatary tumor  . Arthritis Sister   . Obesity Sister   . Cancer Paternal Grandfather     cancer  . Stroke Maternal Aunt     Social History   Social History  . Marital status: Married    Spouse name: N/A   . Number of children: N/A  . Years of education: N/A   Occupational History  . Psychotherapist    Social History Main Topics  . Smoking status: Never Smoker  . Smokeless tobacco: Never Used  . Alcohol use 1.8 - 3.6 oz/week    3 - 6 Standard drinks or equivalent per week     Comment: 5 glasses of wine a week.  . Drug use: No  . Sexual activity: Yes    Partners: Male    Birth control/ protection: None   Other Topics Concern  . Not on file   Social History Narrative   Married. Education: [employer]. Exercise: walking, yoga, and bicycling daily for 1-2 hours. Lives alone, works as psychotherapist    Outpatient Medications Prior to Visit  Medication Sig Dispense Refill  . aspirin 81 MG tablet Take 81 mg by mouth daily. Has stopped prior to procedure    . Calcium-Magnesium-Vitamin D (CALCIUM MAGNESIUM PO) Take 1 tablet by mouth daily.    . Cholecalciferol (VITAMIN D) 2000 units CAPS Take 2,000 Units by mouth daily.    . Cholecalciferol (VITAMIN D3) 50000 units CAPS Take 1 capsule by mouth once a week. (  Patient taking differently: Take 1 capsule by mouth once a week. Friday) 4 capsule 4  . LORazepam (ATIVAN) 0.5 MG tablet Take 1-2 tablets (0.5-1 mg total) by mouth every 8 (eight) hours. 30 tablet 0  . Omega-3 Fatty Acids (OMEGA-3 PO) Take 2 capsules by mouth daily. Omega 3 1280 mg    . HYDROcodone-acetaminophen (NORCO) 5-325 MG tablet Take 1-2 tablets by mouth every 6 (six) hours as needed for moderate pain. 20 tablet 0  . meloxicam (MOBIC) 15 MG tablet Take 1 tablet (15 mg total) by mouth daily. 30 tablet 0  . triamcinolone cream (KENALOG) 0.1 % Apply 1 application topically 3 (three) times daily. 85.2 g 1   No facility-administered medications prior to visit.     Allergies  Allergen Reactions  . Erythromycin Other (See Comments)    Patient states that she passed out while using this medication ? SYNCOPE ?  [name] Other Anaphylaxis    # # # CATS # # #    Review of Systems    Constitutional: Positive for malaise/fatigue. Negative for fever.  HENT: Negative for congestion.   Eyes: Negative for blurred vision.  Respiratory: Negative for cough.   Cardiovascular: Negative for chest pain and palpitations.  Gastrointestinal: Negative for vomiting.  Musculoskeletal: Negative for back pain.  Skin: Negative for rash.  Neurological: Negative for loss of consciousness and headaches.  Psychiatric/Behavioral: Positive for depression. The patient is nervous/anxious.        Objective:    Physical Exam  Constitutional: She is oriented to person, place, and time. She appears well-developed and well-nourished. No distress.  HENT:  Head: Normocephalic and atraumatic.  Nose: Nose normal.  Eyes: Right eye exhibits no discharge. Left eye exhibits no discharge.  Neck: Normal range of motion. Neck supple.  Cardiovascular: Normal rate and regular rhythm.   No murmur heard. Pulmonary/Chest: Effort normal and breath sounds normal.  Abdominal: Soft. Bowel sounds are normal. There is no tenderness.  Musculoskeletal: She exhibits no edema.  Neurological: She is alert and oriented to person, place, and time.  Skin: Skin is warm and dry.  Psychiatric: She has a normal mood and affect.  tearful  Nursing note and vitals reviewed.   BP 112/68 (BP Location: Left Arm, Patient Position: Sitting, Cuff Size: Normal)   Pulse 75   Temp 98.7 F (37.1 C) (Oral)   Wt 174 lb 3.2 oz (79 kg)   SpO2 98%   BMI 26.10 kg/m  Wt Readings from Last 3 Encounters:  05/10/16 174 lb 3.2 oz (79 kg)  05/07/16 173 lb 9.6 oz (78.7 kg)  04/08/16 181 lb (82.1 kg)     Lab Results  Component Value Date   WBC 6.3 05/07/2016   HGB 13.7 05/07/2016   HCT 40.0 05/07/2016   PLT 305 05/07/2016   GLUCOSE 93 05/07/2016   CHOL 227 (H) 01/27/2016   TRIG 65.0 01/27/2016   HDL 77.80 01/27/2016   LDLCALC 137 (H) 01/27/2016   ALT 23 05/07/2016   AST 21 05/07/2016   NA 139 05/07/2016   K 4.1 05/07/2016    CL 102 01/27/2016   CREATININE 0.8 05/07/2016   BUN 14.1 05/07/2016   CO2 26 05/07/2016   TSH 1.52 01/27/2016    Lab Results  Component Value Date   TSH 1.52 01/27/2016   Lab Results  Component Value Date   WBC 6.3 05/07/2016   HGB 13.7 05/07/2016   HCT 40.0 05/07/2016   MCV 88.3 05/07/2016   PLT 305  05/07/2016   Lab Results  Component Value Date   NA 139 05/07/2016   K 4.1 05/07/2016   CHLORIDE 104 05/07/2016   CO2 26 05/07/2016   GLUCOSE 93 05/07/2016   BUN 14.1 05/07/2016   CREATININE 0.8 05/07/2016   BILITOT 0.48 05/07/2016   ALKPHOS 124 05/07/2016   AST 21 05/07/2016   ALT 23 05/07/2016   PROT 7.5 05/07/2016   ALBUMIN 4.4 05/07/2016   CALCIUM 9.7 05/07/2016   ANIONGAP 9 05/07/2016   EGFR 81 (L) 05/07/2016   GFR 81.23 01/27/2016   Lab Results  Component Value Date   CHOL 227 (H) 01/27/2016   Lab Results  Component Value Date   HDL 77.80 01/27/2016   Lab Results  Component Value Date   LDLCALC 137 (H) 01/27/2016   Lab Results  Component Value Date   TRIG 65.0 01/27/2016   Lab Results  Component Value Date   CHOLHDL 3 01/27/2016   No results found for: HGBA1C     Assessment & Plan:   Problem List Items Addressed This Visit    Diffuse large B cell lymphoma ([place])    Has established with Dr Irene Limbo at [facility] and has an appt for a second opinion next week at [facility]. She feels she will proceed with care locally but is going to confirm diagnosis. She is scheduled for a PET scan next week and is anxious to proceed with treatment. Discussed use of ginger for nausea during chemo and she will contact us if she needs anything else. F/u in 6 weeks      Grief reaction    Patient very tearful and under a great deal of stress due to a very sick husband in a nursing home that she cares for daily and now her own diagnosis of Lymphoma. After greater than 30 minute discussion and counseling during 35 minute visit decision was made not to prescribe any  medications. She is considering some grief counseling and she does have some Lorazepam and Alprazolam if she needs it but she has not taken either. She will let us know if we can help in any way         I have discontinued Ms. Arcilla's triamcinolone cream, meloxicam, and HYDROcodone-acetaminophen. I am also having her maintain her aspirin, Vitamin D3, Omega-3 Fatty Acids (OMEGA-3 PO), Calcium-Magnesium-Vitamin D (CALCIUM MAGNESIUM PO), Vitamin D, and LORazepam.  No orders of the defined types were placed in this encounter.    Penni Homans, MD

## 2016-05-12 ENCOUNTER — Ambulatory Visit (HOSPITAL_COMMUNITY)
Admission: RE | Admit: 2016-05-12 | Discharge: 2016-05-12 | Disposition: A | Discharge status: Home / Self Care | Payer: Medicare Other | Source: Ambulatory Visit | Attending: Hematology | Admitting: Hematology

## 2016-05-12 DIAGNOSIS — C8590 Non-Hodgkin lymphoma, unspecified, unspecified site: Secondary | ICD-10-CM | POA: Diagnosis not present

## 2016-05-12 DIAGNOSIS — C8331 Diffuse large B-cell lymphoma, lymph nodes of head, face, and neck: Secondary | ICD-10-CM | POA: Insufficient documentation

## 2016-05-12 LAB — GLUCOSE, CAPILLARY: Glucose-Capillary: 98 mg/dL (ref 65–99)

## 2016-05-12 MED ORDER — FLUDEOXYGLUCOSE F - 18 (FDG) INJECTION: 9.2000 | Freq: Once | INTRAVENOUS | Status: DC | PRN

## 2016-05-13 ENCOUNTER — Encounter: Payer: Self-pay | Admitting: Hematology

## 2016-05-14 ENCOUNTER — Encounter: Payer: Self-pay | Admitting: Hematology

## 2016-05-14 ENCOUNTER — Ambulatory Visit (HOSPITAL_BASED_OUTPATIENT_CLINIC_OR_DEPARTMENT_OTHER): Payer: Medicare Other | Admitting: Hematology

## 2016-05-14 ENCOUNTER — Ambulatory Visit (HOSPITAL_COMMUNITY)
Admission: RE | Admit: 2016-05-14 | Discharge: 2016-05-14 | Disposition: A | Discharge status: Home / Self Care | Payer: Medicare Other | Source: Ambulatory Visit | Attending: Hematology | Admitting: Hematology

## 2016-05-14 ENCOUNTER — Telehealth: Payer: Self-pay | Admitting: Hematology

## 2016-05-14 VITALS — BP 146/72 | HR 67 | Temp 98.9°F | Resp 18 | Ht 68.5 in | Wt 171.9 lb

## 2016-05-14 DIAGNOSIS — R600 Localized edema: Secondary | ICD-10-CM | POA: Diagnosis not present

## 2016-05-14 DIAGNOSIS — I071 Rheumatic tricuspid insufficiency: Secondary | ICD-10-CM | POA: Diagnosis not present

## 2016-05-14 DIAGNOSIS — Z01818 Encounter for other preprocedural examination: Secondary | ICD-10-CM | POA: Diagnosis not present

## 2016-05-14 DIAGNOSIS — C8331 Diffuse large B-cell lymphoma, lymph nodes of head, face, and neck: Secondary | ICD-10-CM | POA: Diagnosis not present

## 2016-05-14 DIAGNOSIS — F419 Anxiety disorder, unspecified: Secondary | ICD-10-CM | POA: Diagnosis not present

## 2016-05-14 DIAGNOSIS — C8338 Diffuse large B-cell lymphoma, lymph nodes of multiple sites: Secondary | ICD-10-CM

## 2016-05-14 MED ORDER — ONDANSETRON HCL 8 MG PO TABS: 8.0000 mg | ORAL_TABLET | Freq: Two times a day (BID) | ORAL | 1 refills | Status: DC | PRN

## 2016-05-14 MED ORDER — LIDOCAINE-PRILOCAINE 2.5-2.5 % EX CREA: TOPICAL_CREAM | CUTANEOUS | 3 refills | Status: DC

## 2016-05-14 MED ORDER — PROCHLORPERAZINE MALEATE 10 MG PO TABS: 10.0000 mg | ORAL_TABLET | Freq: Four times a day (QID) | ORAL | 6 refills | Status: DC | PRN

## 2016-05-14 MED ORDER — ALLOPURINOL 100 MG PO TABS: 100.0000 mg | ORAL_TABLET | Freq: Two times a day (BID) | ORAL | 0 refills | Status: DC

## 2016-05-14 MED ORDER — PREDNISONE 20 MG PO TABS: 80.0000 mg | ORAL_TABLET | Freq: Every day | ORAL | 5 refills | Status: DC

## 2016-05-14 NOTE — Progress Notes (Signed)
  Echocardiogram 2D Echocardiogram has been performed.  Lindsay Harrison 05/14/2016, 11:52 AM

## 2016-05-14 NOTE — Progress Notes (Signed)
START ON PATHWAY REGIMEN - Lymphoma and CLL  LYOS231: R-CHOP q21 Days x 6 Cycles   A cycle is every 21 days:     Rituximab (Rituxan(R)) 375 mg/m2 in _____ mL NS IV day 1 only. Dose Mod: None     Cyclophosphamide (Cytoxan(R)) 750 mg/m2 in 250 mL NS IV over 30 minutes day 1 only Dose Mod: None     Doxorubicin (Adriamycin(R)) 50 mg/m2 IV push day 1 only Dose Mod: None     Vincristine (Oncovin(R)) 1.4 mg/m2; MAX 2 mg in 50 mL normal saline IV over 20 minutes on day 1 only. (MAX DOSE 2 mg) Dose Mod: None     Prednisone 100 mg flat dose orally daily days 1,2,3,4,5. Dose Mod: None Additional Orders: Hepatitis B&C testing recommended prior to rituximab use on all patients. Final concentration of rituximab must be between 1 and 4 mg/mL.  **Always confirm dose/schedule in your pharmacy ordering system**    Patient Characteristics: Diffuse Large B Cell, First Line, Stage III and IV Disease Type: Not Applicable Disease Type: Diffuse Large B Cell Line of therapy: First Line [place] Stage: IVAE  Intent of Therapy: Curative Intent, Discussed with Patient

## 2016-05-14 NOTE — Telephone Encounter (Signed)
Gave patient avs report and appointments for January. Spoke [facility] IR re port placement order and IR will call patient. Central radiology will call re biopsy.

## 2016-05-17 ENCOUNTER — Encounter: Payer: Self-pay | Admitting: *Deleted

## 2016-05-17 ENCOUNTER — Other Ambulatory Visit: Payer: Medicare Other

## 2016-05-19 ENCOUNTER — Other Ambulatory Visit: Payer: Self-pay | Admitting: Radiology

## 2016-05-19 ENCOUNTER — Ambulatory Visit: Payer: Medicare Other | Admitting: Hematology

## 2016-05-21 ENCOUNTER — Encounter (HOSPITAL_COMMUNITY): Payer: Self-pay

## 2016-05-21 ENCOUNTER — Other Ambulatory Visit: Payer: Self-pay | Admitting: Hematology

## 2016-05-21 ENCOUNTER — Ambulatory Visit (HOSPITAL_COMMUNITY)
Admission: RE | Admit: 2016-05-21 | Discharge: 2016-05-21 | Disposition: A | Discharge status: Home / Self Care | Payer: Medicare Other | Source: Ambulatory Visit | Attending: Hematology | Admitting: Hematology

## 2016-05-21 DIAGNOSIS — C8331 Diffuse large B-cell lymphoma, lymph nodes of head, face, and neck: Secondary | ICD-10-CM

## 2016-05-21 DIAGNOSIS — C8330 Diffuse large B-cell lymphoma, unspecified site: Secondary | ICD-10-CM

## 2016-05-21 DIAGNOSIS — Z5111 Encounter for antineoplastic chemotherapy: Secondary | ICD-10-CM | POA: Diagnosis not present

## 2016-05-21 DIAGNOSIS — Z79899 Other long term (current) drug therapy: Secondary | ICD-10-CM | POA: Diagnosis not present

## 2016-05-21 DIAGNOSIS — Z7982 Long term (current) use of aspirin: Secondary | ICD-10-CM | POA: Insufficient documentation

## 2016-05-21 DIAGNOSIS — E559 Vitamin D deficiency, unspecified: Secondary | ICD-10-CM | POA: Diagnosis not present

## 2016-05-21 DIAGNOSIS — R718 Other abnormality of red blood cells: Secondary | ICD-10-CM | POA: Diagnosis not present

## 2016-05-21 HISTORY — PX: IR GENERIC HISTORICAL: IMG1180011

## 2016-05-21 LAB — CBC WITH DIFFERENTIAL/PLATELET
Basophils Absolute: 0 10*3/uL (ref 0.0–0.1)
Basophils Relative: 1 %
Eosinophils Absolute: 0.1 10*3/uL (ref 0.0–0.7)
Eosinophils Relative: 3 %
HCT: 39.4 % (ref 36.0–46.0)
Hemoglobin: 13.5 g/dL (ref 12.0–15.0)
Lymphocytes Relative: 36 %
Lymphs Abs: 1.6 10*3/uL (ref 0.7–4.0)
MCH: 30.3 pg (ref 26.0–34.0)
MCHC: 34.3 g/dL (ref 30.0–36.0)
MCV: 88.5 fL (ref 78.0–100.0)
Monocytes Absolute: 0.4 10*3/uL (ref 0.1–1.0)
Monocytes Relative: 9 %
Neutro Abs: 2.3 10*3/uL (ref 1.7–7.7)
Neutrophils Relative %: 51 %
Platelets: 254 10*3/uL (ref 150–400)
RBC: 4.45 MIL/uL (ref 3.87–5.11)
RDW: 13 % (ref 11.5–15.5)
WBC: 4.4 10*3/uL (ref 4.0–10.5)

## 2016-05-21 LAB — PROTIME-INR
INR: 1.01
Prothrombin Time: 13.3 seconds (ref 11.4–15.2)

## 2016-05-21 LAB — GLUCOSE, CAPILLARY: Glucose-Capillary: 96 mg/dL (ref 65–99)

## 2016-05-21 LAB — BONE MARROW EXAM

## 2016-05-21 MED ORDER — MIDAZOLAM HCL 2 MG/2ML IJ SOLN
INTRAMUSCULAR | Status: AC
  Filled 2016-05-21: qty 6

## 2016-05-21 MED ORDER — MIDAZOLAM HCL 2 MG/2ML IJ SOLN
INTRAMUSCULAR | Status: AC | PRN
  Administered 2016-05-21: 1 mg via INTRAVENOUS
  Administered 2016-05-21: 0.5 mg via INTRAVENOUS

## 2016-05-21 MED ORDER — LIDOCAINE-EPINEPHRINE (PF) 2 %-1:200000 IJ SOLN
INTRAMUSCULAR | Status: AC | PRN
  Administered 2016-05-21: 20 mL

## 2016-05-21 MED ORDER — HEPARIN SOD (PORK) LOCK FLUSH 100 UNIT/ML IV SOLN
INTRAVENOUS | Status: AC
  Filled 2016-05-21: qty 5

## 2016-05-21 MED ORDER — FENTANYL CITRATE (PF) 100 MCG/2ML IJ SOLN
INTRAMUSCULAR | Status: AC | PRN
  Administered 2016-05-21: 25 ug via INTRAVENOUS
  Administered 2016-05-21: 50 ug via INTRAVENOUS

## 2016-05-21 MED ORDER — LIDOCAINE-EPINEPHRINE (PF) 2 %-1:200000 IJ SOLN
INTRAMUSCULAR | Status: AC
  Filled 2016-05-21: qty 20

## 2016-05-21 MED ORDER — FENTANYL CITRATE (PF) 100 MCG/2ML IJ SOLN
INTRAMUSCULAR | Status: AC
  Filled 2016-05-21: qty 6

## 2016-05-21 MED ORDER — CEFAZOLIN SODIUM-DEXTROSE 2-4 GM/100ML-% IV SOLN
2.0000 g | INTRAVENOUS | Status: AC
  Administered 2016-05-21: 2 g via INTRAVENOUS
  Filled 2016-05-21: qty 100

## 2016-05-21 MED ORDER — SODIUM CHLORIDE 0.9 % IV SOLN
INTRAVENOUS | Status: DC
  Administered 2016-05-21: 10:00:00 via INTRAVENOUS

## 2016-05-21 MED ORDER — MIDAZOLAM HCL 2 MG/2ML IJ SOLN
INTRAMUSCULAR | Status: AC | PRN
  Administered 2016-05-21: 0.5 mg via INTRAVENOUS
  Administered 2016-05-21: 1 mg via INTRAVENOUS

## 2016-05-21 NOTE — Discharge Instructions (Signed)
Bone Marrow Aspiration and Bone Marrow Biopsy, Adult, Care After This sheet gives you information about how to care for yourself after your procedure. Your health care provider may also give you more specific instructions. If you have problems or questions, contact your health care provider. What can I expect after the procedure? After the procedure, it is common to have:  Mild pain and tenderness.  Swelling.  Bruising. Follow these instructions at home:  Take over-the-counter or prescription medicines only as told by your health care provider.  Do not take baths, swim, or use a hot tub until your health care provider approves. Ask if you can take a shower or have a sponge bath.  Follow instructions from your health care provider about how to take care of the puncture site. Make sure you:  Wash your hands with soap and water before you change your bandage (dressing). If soap and water are not available, use hand sanitizer.  Change your dressing as told by your health care provider.  Check your puncture siteevery day for signs of infection. Check for:  More redness, swelling, or pain.  More fluid or blood.  Warmth.  Pus or a bad smell.  Return to your normal activities as told by your health care provider. Ask your health care provider what activities are safe for you.  Do not drive for 24 hours if you were given a medicine to help you relax (sedative).  Keep all follow-up visits as told by your health care provider. This is important. Contact a health care provider if:  You have more redness, swelling, or pain around the puncture site.  You have more fluid or blood coming from the puncture site.  Your puncture site feels warm to the touch.  You have pus or a bad smell coming from the puncture site.  You have a fever.  Your pain is not controlled with medicine. This information is not intended to replace advice given to you by your health care provider. Make sure  you discuss any questions you have with your health care provider. Document Released: 11/06/2004 Document Revised: 11/07/2015 Document Reviewed: 10/01/2015 Elsevier Interactive Patient Education  2017 [place] Insertion, Care After Refer to this sheet in the next few weeks. These instructions provide you with information on caring for yourself after your procedure. Your health care provider may also give you more specific instructions. Your treatment has been planned according to current medical practices, but problems sometimes occur. Call your health care provider if you have any problems or questions after your procedure. WHAT TO EXPECT AFTER THE PROCEDURE After your procedure, it is typical to have the following:   Discomfort at the port insertion site. Ice packs to the area will help.  Bruising on the skin over the port. This will subside in 3-4 days. HOME CARE INSTRUCTIONS  After your port is placed, you will get a manufacturer's information card. The card has information about your port. Keep this card with you at all times.   Know what kind of port you have. There are many types of ports available.   Wear a medical alert bracelet in case of an emergency. This can help alert health care workers that you have a port.   The port can stay in for as long as your health care provider believes it is necessary.   A home health care nurse may give medicines and take care of the port.   You or a family member can  get special training and directions for giving medicine and taking care of the port at home.  SEEK MEDICAL CARE IF:   Your port does not flush or you are unable to get a blood return.   You have a fever or chills. SEEK IMMEDIATE MEDICAL CARE IF:  You have new fluid or pus coming from your incision.   You notice a bad smell coming from your incision site.   You have swelling, pain, or more redness at the incision or port site.   You have  chest pain or shortness of breath. This information is not intended to replace advice given to you by your health care provider. Make sure you discuss any questions you have with your health care provider. Document Released: 02/07/2013 Document Revised: 04/24/2013 Document Reviewed: 02/07/2013 Elsevier Interactive Patient Education  2017 [place].  Moderate Conscious Sedation, Adult, Care After These instructions provide you with information about caring for yourself after your procedure. Your health care provider may also give you more specific instructions. Your treatment has been planned according to current medical practices, but problems sometimes occur. Call your health care provider if you have any problems or questions after your procedure. What can I expect after the procedure? After your procedure, it is common:  To feel sleepy for several hours.  To feel clumsy and have poor balance for several hours.  To have poor judgment for several hours.  To vomit if you eat too soon. Follow these instructions at home: For at least 24 hours after the procedure:   Do not:  Participate in activities where you could fall or become injured.  Drive.  Use heavy machinery.  Drink alcohol.  Take sleeping pills or medicines that cause drowsiness.  Make important decisions or sign legal documents.  Take care of children on your own.  Rest. Eating and drinking  Follow the diet recommended by your health care provider.  If you vomit:  Drink water, juice, or soup when you can drink without vomiting.  Make sure you have little or no nausea before eating solid foods. General instructions  Have a responsible adult stay with you until you are awake and alert.  Take over-the-counter and prescription medicines only as told by your health care provider.  If you smoke, do not smoke without supervision.  Keep all follow-up visits as told by your health care provider. This is  important. Contact a health care provider if:  You keep feeling nauseous or you keep vomiting.  You feel light-headed.  You develop a rash.  You have a fever. Get help right away if:  You have trouble breathing. This information is not intended to replace advice given to you by your health care provider. Make sure you discuss any questions you have with your health care provider. Document Released: 02/07/2013 Document Revised: 09/22/2015 Document Reviewed: 08/09/2015 Elsevier Interactive Patient Education  2017 [place].   Patient may remove dressing and shower 24 hours after procedure, Saturday 05/22/16 after noon. Please do not apply Emla cream to Implanted Port incision site for 7 days per Dr. Vernard Gambles.  Request ice upon arrival to [facility] to numb port access site.

## 2016-05-21 NOTE — Procedures (Signed)
R IJ Port cathter placement with US and fluoroscopy No complication No blood loss. See complete dictation in [facility] PACS.  

## 2016-05-21 NOTE — Procedures (Signed)
CT-guided  R iliac bone marrow aspiration and core biopsy No complication No blood loss. See complete dictation in [facility] PACS  

## 2016-05-21 NOTE — Consult Note (Signed)
Chief Complaint: Patient was seen in consultation today for CT guided bone marrow biopsy and port a cath placement  Referring Physician(s): Brunetta Genera  Supervising Physician: Arne Cleveland  Patient Status: [facility] - Out-pt  History of Present Illness: Lindsay Harrison is a 71 y.o. female with history of recently diagnosed diffuse large B-cell lymphoma who presents today for CT-guided bone marrow biopsy to assist with staging and Port-A-Cath placement for planned chemotherapy.    Past Medical History:  Diagnosis Date  . Anemia    h/o iron deficiency secondary to heavy menses  . Arthritis    left hand index finger  . Diffuse large B cell lymphoma ([place])   . Grief reaction 01/27/2016  . H/O measles    as a child  . History of chicken pox    as a child  . History of PCOS   . Hyperlipidemia, mixed 01/27/2016  . Lymphadenopathy   . Preventative health care 01/27/2016  . Vitamin D deficiency   . Wears glasses     Past Surgical History:  Procedure Laterality Date  . COLONOSCOPY    . SKIN BIOPSY Left    face  . THYROGLOSSAL DUCT CYST N/A 04/16/2016   Procedure: THYROGLOSSAL DUCT CYST excision;  Surgeon: [name], MD;  Location: [facility];  Service: ENT;  Laterality: N/A;    Allergies: Erythromycin and Other  Medications: Prior to Admission medications   Medication Sig Start Date End Date Taking? Authorizing Provider  allopurinol (ZYLOPRIM) 100 MG tablet Take 1 tablet (100 mg total) by mouth 2 (two) times daily. 05/14/16   Brunetta Genera, MD  aspirin 81 MG tablet Take 81 mg by mouth daily. Has stopped prior to procedure    Historical Provider, MD  Calcium-Magnesium-Vitamin D (CALCIUM MAGNESIUM PO) Take 1 tablet by mouth daily.    Historical Provider, MD  Cholecalciferol (VITAMIN D) 2000 units CAPS Take 2,000 Units by mouth daily.    Historical Provider, MD  Cholecalciferol (VITAMIN D3) 50000 units CAPS Take 1 capsule by mouth once a week. Patient taking  differently: Take 1 capsule by mouth once a week. Friday 02/02/16   [name], MD  lidocaine-prilocaine (EMLA) cream Apply to affected area once 05/14/16   Brunetta Genera, MD  LORazepam (ATIVAN) 0.5 MG tablet Take 1-2 tablets (0.5-1 mg total) by mouth every 8 (eight) hours. 05/07/16   Brunetta Genera, MD  Omega-3 Fatty Acids (OMEGA-3 PO) Take 2 capsules by mouth daily. Omega 3 1280 mg    Historical Provider, MD  ondansetron (ZOFRAN) 8 MG tablet Take 1 tablet (8 mg total) by mouth 2 (two) times daily as needed for refractory nausea / vomiting. Start on day 3 after chemotherapy 05/14/16   Brunetta Genera, MD  predniSONE (DELTASONE) 20 MG tablet Take 4 tablets (80 mg total) by mouth daily. Take on days 1-5 of chemotherapy. 05/14/16   Brunetta Genera, MD  prochlorperazine (COMPAZINE) 10 MG tablet Take 1 tablet (10 mg total) by mouth every 6 (six) hours as needed (Nausea or vomiting). 05/14/16   Brunetta Genera, MD     Family History  Problem Relation Age of Onset  . Arthritis Mother     rheumatoid  . Heart disease Father     CHF  . COPD Sister     Emphysema, h/o cigarettes  . Other Sister     pituatary tumor  . Arthritis Sister   . Obesity Sister   . Cancer Paternal Grandfather  cancer  . Stroke Maternal Aunt     Social History   Social History  . Marital status: Married    Spouse name: N/A  . Number of children: N/A  . Years of education: N/A   Occupational History  . Psychotherapist    Social History Main Topics  . Smoking status: Never Smoker  . Smokeless tobacco: Never Used  . Alcohol use 1.8 - 3.6 oz/week    3 - 6 Standard drinks or equivalent per week     Comment: 5 glasses of wine a week.  . Drug use: No  . Sexual activity: Yes    Partners: Male    Birth control/ protection: None   Other Topics Concern  . Not on file   Social History Narrative   Married. Education: [employer]. Exercise: walking, yoga, and bicycling daily for 1-2 hours. Lives  alone, works as psychotherapist      Review of Systems  denies fever,HA,CP, dyspnea, abd/back pain,N/V or bleeding. She does have occ cough.  Vital Signs: Blood pressure 134/82, heart rate 63, temperature 98.9, respirations 16, O2 sat 100% room air  Physical Exam awake/alert; chest- CTA bilat; heart- RRR; abd- soft,+BS,NT; ext- no edema  Mallampati Score:     Imaging: Nm Pet Image Initial (pi) Skull Base To Thigh  Result Date: 05/12/2016 CLINICAL DATA:  Initial treatment strategy for recently diagnosed diffuse large B-cell lymphoma. EXAM: NUCLEAR MEDICINE PET SKULL BASE TO THIGH TECHNIQUE: 9.2 mCi F-18 FDG was injected intravenously. Full-ring PET imaging was performed from the skull base to thigh after the radiotracer. CT data was obtained and used for attenuation correction and anatomic localization. FASTING BLOOD GLUCOSE:  Value: 98 mg/dl COMPARISON:  CT neck 01/02/2016 FINDINGS: NECK There is prominent but symmetric hypermetabolic activity within the lymphoid tissue of Waldeyer's ring. This has an SUV max of 6.4. There are calcifications within the right palatine tonsil.There are several small hypermetabolic cervical lymph nodes, including a 6 mm right level IIb node (image 17, SUV max 5.0) and a 4 mm left level IIa node (image 23, SUV max 5.3). Previously noted left paramedian level 1 node has apparently been removed. There is some residual low level activity in this area, presumably postoperative. CHEST There are small hypermetabolic lymph nodes within the left hilum (SUV max 4.7) and left axilla (SUV max 6.3). These nodes are not pathologically enlarged. No suspicious pulmonary activity. The lungs are clear. There is mild atherosclerosis of the aortic arch. ABDOMEN/PELVIS There is no hypermetabolic activity within the liver, adrenal glands, spleen or pancreas. The spleen is normal in size. There are no hypermetabolic or enlarged intra-abdominal lymph nodes. There is a small left inguinal  node which is hypermetabolic (image 081, measuring 7 mm and demonstrating an SUV max of 5.5). SKELETON There is focal hypermetabolic activity within the left aspect of the sternal manubrium, near the sternomanubrial junction. This has an SUV max of 4.1 and correlates with a probable 10 mm sclerotic lesion in this area on CT (image 55). There is also hypermetabolic activity in the left iliac bone (SUV max 5.3). No discrete focal lesion in this area on CT. IMPRESSION: 1. Scattered small mildly hypermetabolic lymph nodes within the neck, left hilum, left axilla and left groin, potentially related the patient's lymphoma. 2. Focal hypermetabolic activity within the left sternal manubrium and left iliac bone, also potentially related to the patient's lymphoma. 3. No evidence of solid visceral organ involvement. Electronically Signed   By: Caryl Comes.D.  On: 05/12/2016 16:21    Labs:  CBC:  Recent Labs  01/27/16 1444 04/08/16 0932 05/07/16 1510  WBC 5.9 5.7 6.3  HGB 14.2 14.2 13.7  HCT 41.8 41.9 40.0  PLT 282.0 296 305    COAGS: No results for input(s): INR, APTT in the last 8760 hours.  BMP:  Recent Labs  01/27/16 1444 05/07/16 1510  NA 138 139  K 4.6 4.1  CL 102  --   CO2 30 26  GLUCOSE 87 93  BUN 13 14.1  CALCIUM 9.4 9.7  CREATININE 0.75 0.8    LIVER FUNCTION TESTS:  Recent Labs  01/27/16 1444 05/07/16 1510  BILITOT 0.8 0.48  AST 17 21  ALT 16 23  ALKPHOS 83 124  PROT 7.4 7.5  ALBUMIN 4.4 4.4    TUMOR MARKERS: No results for input(s): AFPTM, CEA, CA199, CHROMGRNA in the last 8760 hours.  Assessment and Plan:  71 y.o. female with history of recently diagnosed diffuse large B-cell lymphoma who presents today for CT-guided bone marrow biopsy to assist with staging and Port-A-Cath placement for planned chemotherapy. Details/risks of procedures, including but not limited to, internal bleeding, infection, injury to adjacent structures, venous thrombosis discussed  with patient and husband with their understanding and consent.Labs pending.     Thank you for this interesting consult.  I greatly enjoyed meeting Giannie Soliday and look forward to participating in their care.  A copy of this report was sent to the requesting provider on this date.  Electronically Signed: D. Rowe Robert 05/21/2016, 8:47 AM   I spent a total of  25 minutes   in face to face in clinical consultation, greater than 50% of which was counseling/coordinating care for CT-guided bone marrow biopsy and Port-A-Cath placement

## 2016-05-24 ENCOUNTER — Encounter: Payer: Self-pay | Admitting: Nurse Practitioner

## 2016-05-24 ENCOUNTER — Other Ambulatory Visit (HOSPITAL_BASED_OUTPATIENT_CLINIC_OR_DEPARTMENT_OTHER): Payer: Medicare Other

## 2016-05-24 ENCOUNTER — Encounter: Payer: Self-pay | Admitting: Hematology

## 2016-05-24 ENCOUNTER — Ambulatory Visit (HOSPITAL_BASED_OUTPATIENT_CLINIC_OR_DEPARTMENT_OTHER): Payer: Medicare Other | Admitting: Nurse Practitioner

## 2016-05-24 ENCOUNTER — Ambulatory Visit (HOSPITAL_BASED_OUTPATIENT_CLINIC_OR_DEPARTMENT_OTHER): Payer: Medicare Other

## 2016-05-24 VITALS — BP 122/71 | HR 59 | Temp 97.8°F | Resp 16

## 2016-05-24 DIAGNOSIS — Z5112 Encounter for antineoplastic immunotherapy: Secondary | ICD-10-CM

## 2016-05-24 DIAGNOSIS — Z5111 Encounter for antineoplastic chemotherapy: Secondary | ICD-10-CM

## 2016-05-24 DIAGNOSIS — R21 Rash and other nonspecific skin eruption: Secondary | ICD-10-CM | POA: Diagnosis not present

## 2016-05-24 DIAGNOSIS — C8330 Diffuse large B-cell lymphoma, unspecified site: Secondary | ICD-10-CM

## 2016-05-24 DIAGNOSIS — C8338 Diffuse large B-cell lymphoma, lymph nodes of multiple sites: Secondary | ICD-10-CM

## 2016-05-24 DIAGNOSIS — C8331 Diffuse large B-cell lymphoma, lymph nodes of head, face, and neck: Secondary | ICD-10-CM

## 2016-05-24 DIAGNOSIS — T7840XA Allergy, unspecified, initial encounter: Secondary | ICD-10-CM | POA: Insufficient documentation

## 2016-05-24 LAB — CBC & DIFF AND RETIC
BASO%: 0.5 % (ref 0.0–2.0)
Basophils Absolute: 0 10*3/uL (ref 0.0–0.1)
EOS%: 2.4 % (ref 0.0–7.0)
Eosinophils Absolute: 0.1 10*3/uL (ref 0.0–0.5)
HCT: 38.5 % (ref 34.8–46.6)
HGB: 13.1 g/dL (ref 11.6–15.9)
Immature Retic Fract: 1 % — ABNORMAL LOW (ref 1.60–10.00)
LYMPH%: 19.8 % (ref 14.0–49.7)
MCH: 30.5 pg (ref 25.1–34.0)
MCHC: 34 g/dL (ref 31.5–36.0)
MCV: 89.5 fL (ref 79.5–101.0)
MONO#: 0.3 10*3/uL (ref 0.1–0.9)
MONO%: 6 % (ref 0.0–14.0)
NEUT#: 3 10*3/uL (ref 1.5–6.5)
NEUT%: 71.3 % (ref 38.4–76.8)
Platelets: 214 10*3/uL (ref 145–400)
RBC: 4.3 10*6/uL (ref 3.70–5.45)
RDW: 13.1 % (ref 11.2–14.5)
Retic %: 1.06 % (ref 0.70–2.10)
Retic Ct Abs: 45.58 10*3/uL (ref 33.70–90.70)
WBC: 4.2 10*3/uL (ref 3.9–10.3)
lymph#: 0.8 10*3/uL — ABNORMAL LOW (ref 0.9–3.3)

## 2016-05-24 LAB — COMPREHENSIVE METABOLIC PANEL
ALT: 17 U/L (ref 0–55)
AST: 16 U/L (ref 5–34)
Albumin: 3.8 g/dL (ref 3.5–5.0)
Alkaline Phosphatase: 92 U/L (ref 40–150)
Anion Gap: 7 mEq/L (ref 3–11)
BUN: 10.5 mg/dL (ref 7.0–26.0)
CO2: 27 mEq/L (ref 22–29)
Calcium: 9.7 mg/dL (ref 8.4–10.4)
Chloride: 106 mEq/L (ref 98–109)
Creatinine: 0.7 mg/dL (ref 0.6–1.1)
EGFR: 83 mL/min/{1.73_m2} — ABNORMAL LOW (ref >90)
Glucose: 107 mg/dl (ref 70–140)
Potassium: 4.3 mEq/L (ref 3.5–5.1)
Sodium: 139 mEq/L (ref 136–145)
Total Bilirubin: 0.57 mg/dL (ref 0.20–1.20)
Total Protein: 6.8 g/dL (ref 6.4–8.3)

## 2016-05-24 LAB — LACTATE DEHYDROGENASE: LDH: 180 U/L (ref 125–245)

## 2016-05-24 MED ORDER — DIPHENHYDRAMINE HCL 25 MG PO CAPS
50.0000 mg | ORAL_CAPSULE | Freq: Once | ORAL | Status: AC
  Administered 2016-05-24: 50 mg via ORAL

## 2016-05-24 MED ORDER — ACETAMINOPHEN 325 MG PO TABS
650.0000 mg | ORAL_TABLET | Freq: Once | ORAL | Status: AC
  Administered 2016-05-24: 650 mg via ORAL

## 2016-05-24 MED ORDER — HEPARIN SOD (PORK) LOCK FLUSH 100 UNIT/ML IV SOLN
500.0000 [IU] | Freq: Once | INTRAVENOUS | Status: AC | PRN
  Administered 2016-05-24: 500 [IU]
  Filled 2016-05-24: qty 5

## 2016-05-24 MED ORDER — DIPHENHYDRAMINE HCL 50 MG/ML IJ SOLN
50.0000 mg | Freq: Once | INTRAMUSCULAR | Status: AC | PRN
  Administered 2016-05-24: 50 mg via INTRAVENOUS

## 2016-05-24 MED ORDER — SODIUM CHLORIDE 0.9 % IV SOLN
Freq: Once | INTRAVENOUS | Status: AC
  Administered 2016-05-24: 10:00:00 via INTRAVENOUS

## 2016-05-24 MED ORDER — SODIUM CHLORIDE 0.9 % IV SOLN
375.0000 mg/m2 | Freq: Once | INTRAVENOUS | Status: AC
  Administered 2016-05-24: 700 mg via INTRAVENOUS
  Filled 2016-05-24: qty 50

## 2016-05-24 MED ORDER — PALONOSETRON HCL INJECTION 0.25 MG/5ML
INTRAVENOUS | Status: AC
  Filled 2016-05-24: qty 5

## 2016-05-24 MED ORDER — DIPHENHYDRAMINE HCL 25 MG PO CAPS
ORAL_CAPSULE | ORAL | Status: AC
  Filled 2016-05-24: qty 2

## 2016-05-24 MED ORDER — METHYLPREDNISOLONE SODIUM SUCC 125 MG IJ SOLR
125.0000 mg | Freq: Once | INTRAMUSCULAR | Status: AC | PRN
  Administered 2016-05-24: 125 mg via INTRAVENOUS

## 2016-05-24 MED ORDER — DEXAMETHASONE SODIUM PHOSPHATE 10 MG/ML IJ SOLN
INTRAMUSCULAR | Status: AC
  Filled 2016-05-24: qty 1

## 2016-05-24 MED ORDER — DOXORUBICIN HCL CHEMO IV INJECTION 2 MG/ML
50.0000 mg/m2 | Freq: Once | INTRAVENOUS | Status: AC
  Administered 2016-05-24: 98 mg via INTRAVENOUS
  Filled 2016-05-24: qty 49

## 2016-05-24 MED ORDER — SODIUM CHLORIDE 0.9% FLUSH
10.0000 mL | INTRAVENOUS | Status: DC | PRN
  Administered 2016-05-24: 10 mL
  Filled 2016-05-24: qty 10

## 2016-05-24 MED ORDER — SODIUM CHLORIDE 0.9 % IV SOLN: 10.0000 mg | Freq: Once | INTRAVENOUS | Status: DC

## 2016-05-24 MED ORDER — SODIUM CHLORIDE 0.9 % IV SOLN
750.0000 mg/m2 | Freq: Once | INTRAVENOUS | Status: AC
  Administered 2016-05-24: 1460 mg via INTRAVENOUS
  Filled 2016-05-24: qty 73

## 2016-05-24 MED ORDER — DEXAMETHASONE SODIUM PHOSPHATE 10 MG/ML IJ SOLN
10.0000 mg | Freq: Once | INTRAMUSCULAR | Status: AC
  Administered 2016-05-24: 10 mg via INTRAVENOUS

## 2016-05-24 MED ORDER — VINCRISTINE SULFATE CHEMO INJECTION 1 MG/ML
2.0000 mg | Freq: Once | INTRAVENOUS | Status: AC
  Administered 2016-05-24: 2 mg via INTRAVENOUS
  Filled 2016-05-24: qty 2

## 2016-05-24 MED ORDER — PALONOSETRON HCL INJECTION 0.25 MG/5ML
0.2500 mg | Freq: Once | INTRAVENOUS | Status: AC
  Administered 2016-05-24: 0.25 mg via INTRAVENOUS

## 2016-05-24 MED ORDER — FAMOTIDINE IN NACL 20-0.9 MG/50ML-% IV SOLN
20.0000 mg | Freq: Once | INTRAVENOUS | Status: AC | PRN
  Administered 2016-05-24: 20 mg via INTRAVENOUS

## 2016-05-24 MED ORDER — ACETAMINOPHEN 325 MG PO TABS
ORAL_TABLET | ORAL | Status: AC
Start: 2016-05-24 — End: 2016-05-24
  Filled 2016-05-24: qty 2

## 2016-05-24 NOTE — Patient Instructions (Addendum)
[facility] Discharge Instructions for Patients Receiving Chemotherapy  Today you received the following chemotherapy agents Adriamycin, Vincristine, Cytoxan and Rituxan   To help prevent nausea and vomiting after your treatment, we encourage you to take your nausea medication as directed. No Zofran/Ondansetron for 3 days. Take Compazine instead.    If you develop nausea and vomiting that is not controlled by your nausea medication, call the clinic.   BELOW ARE SYMPTOMS THAT SHOULD BE REPORTED IMMEDIATELY:  *FEVER GREATER THAN 100.5 F  *CHILLS WITH OR WITHOUT FEVER  NAUSEA AND VOMITING THAT IS NOT CONTROLLED WITH YOUR NAUSEA MEDICATION  *UNUSUAL SHORTNESS OF BREATH  *UNUSUAL BRUISING OR BLEEDING  TENDERNESS IN MOUTH AND THROAT WITH OR WITHOUT PRESENCE OF ULCERS  *URINARY PROBLEMS  *BOWEL PROBLEMS  UNUSUAL RASH Items with * indicate a potential emergency and should be followed up as soon as possible.  Feel free to call the clinic you have any questions or concerns. The clinic phone number is [phone].  Please show the [place] at check-in to the Emergency Department and triage nurse.  Doxorubicin injection What is this medicine? DOXORUBICIN (dox oh ROO bi sin) is a chemotherapy drug. It is used to treat many kinds of cancer like leukemia, lymphoma, neuroblastoma, sarcoma, and Wilms' tumor. It is also used to treat bladder cancer, breast cancer, lung cancer, ovarian cancer, stomach cancer, and thyroid cancer. This medicine may be used for other purposes; ask your health care provider or pharmacist if you have questions. COMMON BRAND NAME(S): Adriamycin, Adriamycin PFS, Adriamycin RDF, Rubex What should I tell my health care provider before I take this medicine? They need to know if you have any of these conditions: -heart disease -history of low blood counts caused by a medicine -liver disease -recent or ongoing radiation therapy -an unusual or  allergic reaction to doxorubicin, other chemotherapy agents, other medicines, foods, dyes, or preservatives -pregnant or trying to get pregnant -breast-feeding How should I use this medicine? This drug is given as an infusion into a vein. It is administered in a hospital or clinic by a specially trained health care professional. If you have pain, swelling, burning or any unusual feeling around the site of your injection, tell your health care professional right away. Talk to your pediatrician regarding the use of this medicine in children. Special care may be needed. Overdosage: If you think you have taken too much of this medicine contact a poison control center or emergency room at once. NOTE: This medicine is only for you. Do not share this medicine with others. What if I miss a dose? It is important not to miss your dose. Call your doctor or health care professional if you are unable to keep an appointment. What may interact with this medicine? This medicine may interact with the following medications: -6-mercaptopurine -paclitaxel -phenytoin -St. John's Wort -trastuzumab -verapamil This list may not describe all possible interactions. Give your health care provider a list of all the medicines, herbs, non-prescription drugs, or dietary supplements you use. Also tell them if you smoke, drink alcohol, or use illegal drugs. Some items may interact with your medicine. What should I watch for while using this medicine? This drug may make you feel generally unwell. This is not uncommon, as chemotherapy can affect healthy cells as well as cancer cells. Report any side effects. Continue your course of treatment even though you feel ill unless your doctor tells you to stop. There is a maximum amount of this  medicine you should receive throughout your life. The amount depends on the medical condition being treated and your overall health. Your doctor will watch how much of this medicine you receive in  your lifetime. Tell your doctor if you have taken this medicine before. You may need blood work done while you are taking this medicine. Your urine may turn red for a few days after your dose. This is not blood. If your urine is dark or brown, call your doctor. In some cases, you may be given additional medicines to help with side effects. Follow all directions for their use. Call your doctor or health care professional for advice if you get a fever, chills or sore throat, or other symptoms of a cold or flu. Do not treat yourself. This drug decreases your body's ability to fight infections. Try to avoid being around people who are sick. This medicine may increase your risk to bruise or bleed. Call your doctor or health care professional if you notice any unusual bleeding. Talk to your doctor about your risk of cancer. You may be more at risk for certain types of cancers if you take this medicine. Do not become pregnant while taking this medicine or for 6 months after stopping it. Women should inform their doctor if they wish to become pregnant or think they might be pregnant. Men should not father a child while taking this medicine and for 6 months after stopping it. There is a potential for serious side effects to an unborn child. Talk to your health care professional or pharmacist for more information. Do not breast-feed an infant while taking this medicine. This medicine has caused ovarian failure in some women and reduced sperm counts in some men This medicine may interfere with the ability to have a child. Talk with your doctor or health care professional if you are concerned about your fertility. What side effects may I notice from receiving this medicine? Side effects that you should report to your doctor or health care professional as soon as possible: -allergic reactions like skin rash, itching or hives, swelling of the face, lips, or tongue -breathing problems -chest pain -fast or irregular  heartbeat -low blood counts - this medicine may decrease the number of white blood cells, red blood cells and platelets. You may be at increased risk for infections and bleeding. -pain, redness, or irritation at site where injected -signs of infection - fever or chills, cough, sore throat, pain or difficulty passing urine -signs of decreased platelets or bleeding - bruising, pinpoint red spots on the skin, black, tarry stools, blood in the urine -swelling of the ankles, feet, hands -tiredness -weakness Side effects that usually do not require medical attention (report to your doctor or health care professional if they continue or are bothersome): -diarrhea -hair loss -mouth sores -nail discoloration or damage -nausea -red colored urine -vomiting This list may not describe all possible side effects. Call your doctor for medical advice about side effects. You may report side effects to FDA at 1-800-FDA-1088. Where should I keep my medicine? This drug is given in a hospital or clinic and will not be stored at home. NOTE: This sheet is a summary. It may not cover all possible information. If you have questions about this medicine, talk to your doctor, pharmacist, or health care provider.  2017 Elsevier/Gold Standard (2015-06-16 11:28:51)  Vincristine injection What is this medicine? VINCRISTINE (vin KRIS teen) is a chemotherapy drug. It slows the growth of cancer cells. This medicine is  used to treat many types of cancer like Hodgkin's disease, leukemia, non-Hodgkin's lymphoma, neuroblastoma (brain cancer), rhabdomyosarcoma, and Wilms' tumor. This medicine may be used for other purposes; ask your health care provider or pharmacist if you have questions. COMMON BRAND NAME(S): Oncovin, Vincasar PFS What should I tell my health care provider before I take this medicine? They need to know if you have any of these conditions: -blood disorders -gout -infection (especially chickenpox, cold  sores, or herpes) -kidney disease -liver disease -lung disease -nervous system disease like Charcot-Marie-Tooth (CMT) -recent or ongoing radiation therapy -an unusual or allergic reaction to vincristine, other chemotherapy agents, other medicines, foods, dyes, or preservatives -pregnant or trying to get pregnant -breast-feeding How should I use this medicine? This drug is given as an infusion into a vein. It is administered in a hospital or clinic by a specially trained health care professional. If you have pain, swelling, burning, or any unusual feeling around the site of your injection, tell your health care professional right away. Talk to your pediatrician regarding the use of this medicine in children. While this drug may be prescribed for selected conditions, precautions do apply. Overdosage: If you think you have taken too much of this medicine contact a poison control center or emergency room at once. NOTE: This medicine is only for you. Do not share this medicine with others. What if I miss a dose? It is important not to miss your dose. Call your doctor or health care professional if you are unable to keep an appointment. What may interact with this medicine? Do not take this medicine with any of the following medications: -itraconazole -mibefradil -voriconazole This medicine may also interact with the following medications: -cyclosporine -erythromycin -fluconazole -ketoconazole -medicines for HIV like delavirdine, efavirenz, nevirapine -medicines for seizures like ethotoin, fosphenotoin, phenytoin -medicines to increase blood counts like filgrastim, pegfilgrastim, sargramostim -other chemotherapy drugs like cisplatin, L-asparaginase, methotrexate, mitomycin, paclitaxel -pegaspargase -vaccines -zalcitabine, ddC Talk to your doctor or health care professional before taking any of these medicines: -acetaminophen -aspirin -ibuprofen -ketoprofen -naproxen This list may not  describe all possible interactions. Give your health care provider a list of all the medicines, herbs, non-prescription drugs, or dietary supplements you use. Also tell them if you smoke, drink alcohol, or use illegal drugs. Some items may interact with your medicine. What should I watch for while using this medicine? Your condition will be monitored carefully while you are receiving this medicine. You will need important blood work done while you are taking this medicine. This drug may make you feel generally unwell. This is not uncommon, as chemotherapy can affect healthy cells as well as cancer cells. Report any side effects. Continue your course of treatment even though you feel ill unless your doctor tells you to stop. In some cases, you may be given additional medicines to help with side effects. Follow all directions for their use. Call your doctor or health care professional for advice if you get a fever, chills or sore throat, or other symptoms of a cold or flu. Do not treat yourself. Avoid taking products that contain aspirin, acetaminophen, ibuprofen, naproxen, or ketoprofen unless instructed by your doctor. These medicines may hide a fever. Do not become pregnant while taking this medicine. Women should inform their doctor if they wish to become pregnant or think they might be pregnant. There is a potential for serious side effects to an unborn child. Talk to your health care professional or pharmacist for more information. Do not breast-feed an  infant while taking this medicine. Men may have a lower sperm count while taking this medicine. Talk to your doctor if you plan to father a child. What side effects may I notice from receiving this medicine? Side effects that you should report to your doctor or health care professional as soon as possible: -allergic reactions like skin rash, itching or hives, swelling of the face, lips, or tongue -breathing problems -confusion or changes in emotions  or moods -constipation -cough -mouth sores -muscle weakness -nausea and vomiting -pain, swelling, redness or irritation at the injection site -pain, tingling, numbness in the hands or feet -problems with balance, talking, walking -seizures -stomach pain -trouble passing urine or change in the amount of urine Side effects that usually do not require medical attention (report to your doctor or health care professional if they continue or are bothersome): -diarrhea -hair loss -jaw pain -loss of appetite This list may not describe all possible side effects. Call your doctor for medical advice about side effects. You may report side effects to FDA at 1-800-FDA-1088. Where should I keep my medicine? This drug is given in a hospital or clinic and will not be stored at home. NOTE: This sheet is a summary. It may not cover all possible information. If you have questions about this medicine, talk to your doctor, pharmacist, or health care provider.  2017 Elsevier/Gold Standard (2008-01-15 17:17:13)  Cyclophosphamide injection What is this medicine? CYCLOPHOSPHAMIDE (sye kloe FOSS fa mide) is a chemotherapy drug. It slows the growth of cancer cells. This medicine is used to treat many types of cancer like lymphoma, myeloma, leukemia, breast cancer, and ovarian cancer, to name a few. This medicine may be used for other purposes; ask your health care provider or pharmacist if you have questions. COMMON BRAND NAME(S): Cytoxan, Neosar What should I tell my health care provider before I take this medicine? They need to know if you have any of these conditions: -blood disorders -history of other chemotherapy -infection -kidney disease -liver disease -recent or ongoing radiation therapy -tumors in the bone marrow -an unusual or allergic reaction to cyclophosphamide, other chemotherapy, other medicines, foods, dyes, or preservatives -pregnant or trying to get pregnant -breast-feeding How should  I use this medicine? This drug is usually given as an injection into a vein or muscle or by infusion into a vein. It is administered in a hospital or clinic by a specially trained health care professional. Talk to your pediatrician regarding the use of this medicine in children. Special care may be needed. Overdosage: If you think you have taken too much of this medicine contact a poison control center or emergency room at once. NOTE: This medicine is only for you. Do not share this medicine with others. What if I miss a dose? It is important not to miss your dose. Call your doctor or health care professional if you are unable to keep an appointment. What may interact with this medicine? This medicine may interact with the following medications: -amiodarone -amphotericin B -azathioprine -certain antiviral medicines for HIV or AIDS such as protease inhibitors (e.g., indinavir, ritonavir) and zidovudine -certain blood pressure medications such as benazepril, captopril, enalapril, fosinopril, lisinopril, moexipril, monopril, perindopril, quinapril, ramipril, trandolapril -certain cancer medications such as anthracyclines (e.g., daunorubicin, doxorubicin), busulfan, cytarabine, paclitaxel, pentostatin, tamoxifen, trastuzumab -certain diuretics such as chlorothiazide, chlorthalidone, hydrochlorothiazide, indapamide, metolazone -certain medicines that treat or prevent blood clots like warfarin -certain muscle relaxants such as succinylcholine -cyclosporine -etanercept -indomethacin -medicines to increase blood counts like filgrastim,  pegfilgrastim, sargramostim -medicines used as general anesthesia -metronidazole -natalizumab This list may not describe all possible interactions. Give your health care provider a list of all the medicines, herbs, non-prescription drugs, or dietary supplements you use. Also tell them if you smoke, drink alcohol, or use illegal drugs. Some items may interact with your  medicine. What should I watch for while using this medicine? Visit your doctor for checks on your progress. This drug may make you feel generally unwell. This is not uncommon, as chemotherapy can affect healthy cells as well as cancer cells. Report any side effects. Continue your course of treatment even though you feel ill unless your doctor tells you to stop. Drink water or other fluids as directed. Urinate often, even at night. In some cases, you may be given additional medicines to help with side effects. Follow all directions for their use. Call your doctor or health care professional for advice if you get a fever, chills or sore throat, or other symptoms of a cold or flu. Do not treat yourself. This drug decreases your body's ability to fight infections. Try to avoid being around people who are sick. This medicine may increase your risk to bruise or bleed. Call your doctor or health care professional if you notice any unusual bleeding. Be careful brushing and flossing your teeth or using a toothpick because you may get an infection or bleed more easily. If you have any dental work done, tell your dentist you are receiving this medicine. You may get drowsy or dizzy. Do not drive, use machinery, or do anything that needs mental alertness until you know how this medicine affects you. Do not become pregnant while taking this medicine or for 1 year after stopping it. Women should inform their doctor if they wish to become pregnant or think they might be pregnant. Men should not father a child while taking this medicine and for 4 months after stopping it. There is a potential for serious side effects to an unborn child. Talk to your health care professional or pharmacist for more information. Do not breast-feed an infant while taking this medicine. This medicine may interfere with the ability to have a child. This medicine has caused ovarian failure in some women. This medicine has caused reduced sperm  counts in some men. You should talk with your doctor or health care professional if you are concerned about your fertility. If you are going to have surgery, tell your doctor or health care professional that you have taken this medicine. What side effects may I notice from receiving this medicine? Side effects that you should report to your doctor or health care professional as soon as possible: -allergic reactions like skin rash, itching or hives, swelling of the face, lips, or tongue -low blood counts - this medicine may decrease the number of white blood cells, red blood cells and platelets. You may be at increased risk for infections and bleeding. -signs of infection - fever or chills, cough, sore throat, pain or difficulty passing urine -signs of decreased platelets or bleeding - bruising, pinpoint red spots on the skin, black, tarry stools, blood in the urine -signs of decreased red blood cells - unusually weak or tired, fainting spells, lightheadedness -breathing problems -dark urine -dizziness -palpitations -swelling of the ankles, feet, hands -trouble passing urine or change in the amount of urine -weight gain -yellowing of the eyes or skin Side effects that usually do not require medical attention (report to your doctor or health care professional if  they continue or are bothersome): -changes in nail or skin color -hair loss -missed menstrual periods -mouth sores -nausea, vomiting This list may not describe all possible side effects. Call your doctor for medical advice about side effects. You may report side effects to FDA at 1-800-FDA-1088. Where should I keep my medicine? This drug is given in a hospital or clinic and will not be stored at home. NOTE: This sheet is a summary. It may not cover all possible information. If you have questions about this medicine, talk to your doctor, pharmacist, or health care provider.  2017 Elsevier/Gold Standard (2012-03-03  16:22:58)  Rituximab injection What is this medicine? RITUXIMAB (ri TUX i mab) is a monoclonal antibody. It is used to treat non-Hodgkin lymphoma and chronic lymphocytic leukemia. It is also used to treat rheumatoid arthritis (RA). In RA, this medicine slows the inflammatory process and help reduce joint pain and swelling. This medicine is often used with other cancer or arthritis medications. This medicine may be used for other purposes; ask your health care provider or pharmacist if you have questions. COMMON BRAND NAME(S): Rituxan What should I tell my health care provider before I take this medicine? They need to know if you have any of these conditions: -heart disease -infection (especially a virus infection such as hepatitis B, chickenpox, cold sores, or herpes) -immune system problems -irregular heartbeat -kidney disease -lung or breathing disease, like asthma -recently received or scheduled to receive a vaccine -an unusual or allergic reaction to rituximab, mouse proteins, other medicines, foods, dyes, or preservatives -pregnant or trying to get pregnant -breast-feeding How should I use this medicine? This medicine is for infusion into a vein. It is administered in a hospital or clinic by a specially trained health care professional. A special MedGuide will be given to you by the pharmacist with each prescription and refill. Be sure to read this information carefully each time. Talk to your pediatrician regarding the use of this medicine in children. This medicine is not approved for use in children. Overdosage: If you think you have taken too much of this medicine contact a poison control center or emergency room at once. NOTE: This medicine is only for you. Do not share this medicine with others. What if I miss a dose? It is important not to miss a dose. Call your doctor or health care professional if you are unable to keep an appointment. What may interact with this  medicine? -cisplatin -medicines for blood pressure -some other medicines for arthritis -vaccines This list may not describe all possible interactions. Give your health care provider a list of all the medicines, herbs, non-prescription drugs, or dietary supplements you use. Also tell them if you smoke, drink alcohol, or use illegal drugs. Some items may interact with your medicine. What should I watch for while using this medicine? Report any side effects that you notice during your treatment right away, such as changes in your breathing, fever, chills, dizziness or lightheadedness. These effects are more common with the first dose. Visit your prescriber or health care professional for checks on your progress. You will need to have regular blood work. Report any other side effects. The side effects of this medicine can continue after you finish your treatment. Continue your course of treatment even though you feel ill unless your doctor tells you to stop. Call your doctor or health care professional for advice if you get a fever, chills or sore throat, or other symptoms of a cold or flu. Do  not treat yourself. This drug decreases your body's ability to fight infections. Try to avoid being around people who are sick. This medicine may increase your risk to bruise or bleed. Call your doctor or health care professional if you notice any unusual bleeding. Be careful brushing and flossing your teeth or using a toothpick because you may get an infection or bleed more easily. If you have any dental work done, tell your dentist you are receiving this medicine. Avoid taking products that contain aspirin, acetaminophen, ibuprofen, naproxen, or ketoprofen unless instructed by your doctor. These medicines may hide a fever. Do not become pregnant while taking this medicine. Women should inform their doctor if they wish to become pregnant or think they might be pregnant. There is a potential for serious side effects  to an unborn child. Talk to your health care professional or pharmacist for more information. Do not breast-feed an infant while taking this medicine. What side effects may I notice from receiving this medicine? Side effects that you should report to your doctor or health care professional as soon as possible: -allergic reactions like skin rash, itching or hives, swelling of the face, lips, or tongue -low blood counts - this medicine may decrease the number of white blood cells, red blood cells and platelets. You may be at increased risk for infections and bleeding. -signs of infection - fever or chills, cough, sore throat, pain or difficulty passing urine -signs of decreased platelets or bleeding - bruising, pinpoint red spots on the skin, black, tarry stools, blood in the urine -signs of decreased red blood cells - unusually weak or tired, fainting spells, lightheadedness -breathing problems -confused, not responsive -chest pain -fast, irregular heartbeat -feeling faint or lightheaded, falls -mouth sores -redness, blistering, peeling or loosening of the skin, including inside the mouth -stomach pain -swelling of the ankles, feet, or hands -trouble passing urine or change in the amount of urine Side effects that usually do not require medical attention (report to your doctor or health care professional if they continue or are bothersome): -anxiety -headache -loss of appetite -muscle aches -nausea -night sweats This list may not describe all possible side effects. Call your doctor for medical advice about side effects. You may report side effects to FDA at 1-800-FDA-1088. Where should I keep my medicine? This drug is given in a hospital or clinic and will not be stored at home. NOTE: This sheet is a summary. It may not cover all possible information. If you have questions about this medicine, talk to your doctor, pharmacist, or health care provider.  2017 Elsevier/Gold Standard  (2015-10-30 17:23:26)

## 2016-05-24 NOTE — Progress Notes (Signed)
Introduced myself as her FA, discussed copay [employer] and gave her an expense sheet.  PT has 2 insurance so copay assistance shouldn't be needed but she would like to apply for the [employer].  She will bring her proof of income on 05/26/16.

## 2016-05-24 NOTE — Progress Notes (Signed)
SYMPTOM MANAGEMENT CLINIC    Chief Complaint: Hypersensitivity reaction  HPI:  Lindsay Harrison 71 y.o. female diagnosed with  lymphoma.  Presents to the [facility] today to receive her first Rituxan infusion.   No history exists.    Review of Systems  HENT:       Complaint of scratchy throat.  Skin: Positive for rash.  All other systems reviewed and are negative.   Past Medical History:  Diagnosis Date  . Anemia    h/o iron deficiency secondary to heavy menses  . Arthritis    left hand index finger  . Diffuse large B cell lymphoma ([place])   . Grief reaction 01/27/2016  . H/O measles    as a child  . History of chicken pox    as a child  . History of PCOS   . Hyperlipidemia, mixed 01/27/2016  . Lymphadenopathy   . Preventative health care 01/27/2016  . Vitamin D deficiency   . Wears glasses     Past Surgical History:  Procedure Laterality Date  . COLONOSCOPY    . IR GENERIC HISTORICAL  05/21/2016   IR FLUORO GUIDE PORT INSERTION RIGHT 05/21/2016 Arne Cleveland, MD [facility]INTER[facility]  . IR GENERIC HISTORICAL  05/21/2016   IR US GUIDE VASC ACCESS RIGHT 05/21/2016 Arne Cleveland, MD [facility]INTER[facility]  . SKIN BIOPSY Left    face  . THYROGLOSSAL DUCT CYST N/A 04/16/2016   Procedure: THYROGLOSSAL DUCT CYST excision;  Surgeon: [name], MD;  Location: [facility];  Service: ENT;  Laterality: N/A;    has Hyperlipidemia, mixed; History of chicken pox; Vitamin D deficiency; Diffuse large B cell lymphoma ([place]); History of PCOS; Grief reaction; Preventative health care; Anemia; History of colonic polyps; Actinic keratoses; and Hypersensitivity reaction on her problem list.    is allergic to erythromycin and other.  Allergies as of 05/24/2016      Reactions   Erythromycin Other (See Comments)   Patient states that she passed out while using this medication ? SYNCOPE ?   Other Anaphylaxis   # # # CATS # # #      Medication List       Accurate as of 05/24/16  5:46 PM. Always  use your most recent med list.          allopurinol 100 MG tablet Commonly known as:  ZYLOPRIM Take 1 tablet (100 mg total) by mouth 2 (two) times daily.   aspirin 81 MG tablet Take 81 mg by mouth daily. Has stopped prior to procedure   CALCIUM MAGNESIUM PO Take 1 tablet by mouth daily.   lidocaine-prilocaine cream Commonly known as:  EMLA Apply to affected area once   LORazepam 0.5 MG tablet Commonly known as:  ATIVAN Take 1-2 tablets (0.5-1 mg total) by mouth every 8 (eight) hours.   OMEGA-3 PO Take 2 capsules by mouth daily. Omega 3 1280 mg   ondansetron 8 MG tablet Commonly known as:  ZOFRAN Take 1 tablet (8 mg total) by mouth 2 (two) times daily as needed for refractory nausea / vomiting. Start on day 3 after chemotherapy   predniSONE 20 MG tablet Commonly known as:  DELTASONE Take 4 tablets (80 mg total) by mouth daily. Take on days 1-5 of chemotherapy.   prochlorperazine 10 MG tablet Commonly known as:  COMPAZINE Take 1 tablet (10 mg total) by mouth every 6 (six) hours as needed (Nausea or vomiting).   Vitamin D 2000 units Caps Take 2,000 Units by mouth  daily.   Vitamin D3 50000 units Caps Take 1 capsule by mouth once a week.        PHYSICAL EXAMINATION  Oncology Vitals 05/24/2016 05/24/2016  Height - -  Weight - -  Weight (lbs) - -  BMI (kg/m2) - -  Temp 97.9 98.3  Pulse 58 60  Resp 16 16  SpO2 95 97  BSA (m2) - -   BP Readings from Last 2 Encounters:  05/24/16 111/65  05/21/16 (!) 106/57    Physical Exam  Constitutional: She is oriented to person, place, and time and well-developed, well-nourished, and in no distress.  HENT:  Head: Normocephalic and atraumatic.  Mouth/Throat: Oropharynx is clear and moist.  Eyes: Conjunctivae and EOM are normal. Pupils are equal, round, and reactive to light. Right eye exhibits no discharge. Left eye exhibits no discharge. No scleral icterus.  Neck: Normal range of motion. Neck supple. No JVD present. No  tracheal deviation present. No thyromegaly present.  Cardiovascular: Normal rate, regular rhythm, normal heart sounds and intact distal pulses.   Pulmonary/Chest: Effort normal and breath sounds normal. No respiratory distress. She has no wheezes. She has no rales. She exhibits no tenderness.  Abdominal: Soft. Bowel sounds are normal. She exhibits no distension and no mass. There is no tenderness. There is no rebound and no guarding.  Musculoskeletal: Normal range of motion. She exhibits no edema or tenderness.  Lymphadenopathy:    She has no cervical adenopathy.  Neurological: She is alert and oriented to person, place, and time. Gait normal.  Skin: Skin is warm and dry. Rash noted. No erythema. No pallor.  Psychiatric: Affect normal.  Nursing note and vitals reviewed.   LABORATORY DATA:. Appointment on 05/24/2016  Component Date Value Ref Range Status  . WBC 05/24/2016 4.2  3.9 - 10.3 10e3/uL Final  . NEUT# 05/24/2016 3.0  1.5 - 6.5 10e3/uL Final  . HGB 05/24/2016 13.1  11.6 - 15.9 g/dL Final  . HCT 05/24/2016 38.5  34.8 - 46.6 % Final  . Platelets 05/24/2016 214  145 - 400 10e3/uL Final  . MCV 05/24/2016 89.5  79.5 - 101.0 fL Final  . MCH 05/24/2016 30.5  25.1 - 34.0 pg Final  . MCHC 05/24/2016 34.0  31.5 - 36.0 g/dL Final  . RBC 05/24/2016 4.30  3.70 - 5.45 10e6/uL Final  . RDW 05/24/2016 13.1  11.2 - 14.5 % Final  . lymph# 05/24/2016 0.8* 0.9 - 3.3 10e3/uL Final  . MONO# 05/24/2016 0.3  0.1 - 0.9 10e3/uL Final  . Eosinophils Absolute 05/24/2016 0.1  0.0 - 0.5 10e3/uL Final  . Basophils Absolute 05/24/2016 0.0  0.0 - 0.1 10e3/uL Final  . NEUT% 05/24/2016 71.3  38.4 - 76.8 % Final  . LYMPH% 05/24/2016 19.8  14.0 - 49.7 % Final  . MONO% 05/24/2016 6.0  0.0 - 14.0 % Final  . EOS% 05/24/2016 2.4  0.0 - 7.0 % Final  . BASO% 05/24/2016 0.5  0.0 - 2.0 % Final  . Retic % 05/24/2016 1.06  0.70 - 2.10 % Final  . Retic Ct Abs 05/24/2016 45.58  33.70 - 90.70 10e3/uL Final  . Immature  Retic Fract 05/24/2016 1.00* 1.60 - 10.00 % Final  . Sodium 05/24/2016 139  136 - 145 mEq/L Final  . Potassium 05/24/2016 4.3  3.5 - 5.1 mEq/L Final  . Chloride 05/24/2016 106  98 - 109 mEq/L Final  . CO2 05/24/2016 27  22 - 29 mEq/L Final  . Glucose 05/24/2016 107  70 - 140 mg/dl Final  . BUN 05/24/2016 10.5  7.0 - 26.0 mg/dL Final  . Creatinine 05/24/2016 0.7  0.6 - 1.1 mg/dL Final  . Total Bilirubin 05/24/2016 0.57  0.20 - 1.20 mg/dL Final  . Alkaline Phosphatase 05/24/2016 92  40 - 150 U/L Final  . AST 05/24/2016 16  5 - 34 U/L Final  . ALT 05/24/2016 17  0 - 55 U/L Final  . Total Protein 05/24/2016 6.8  6.4 - 8.3 g/dL Final  . Albumin 05/24/2016 3.8  3.5 - 5.0 g/dL Final  . Calcium 05/24/2016 9.7  8.4 - 10.4 mg/dL Final  . Anion Gap 05/24/2016 7  3 - 11 mEq/L Final  . EGFR 05/24/2016 83* >90 ml/min/1.73 m2 Final  . LDH 05/24/2016 180  125 - 245 U/L Final    RADIOGRAPHIC STUDIES: Ir US Guide Vasc Access Right  Result Date: 05/21/2016 CLINICAL DATA:  Diffuse large B-cell lymphoma, needs durable venous access for chemotherapy regimen EXAM: TUNNELED PORT CATHETER PLACEMENT WITH ULTRASOUND AND FLUOROSCOPIC GUIDANCE FLUOROSCOPY TIME:  0.1 minute (15 uGym2 DAP) ANESTHESIA/SEDATION: Intravenous Fentanyl and Versed were administered as conscious sedation during continuous monitoring of the patient's level of consciousness and physiological / cardiorespiratory status by the radiology RN, with a total moderate sedation time of 15 minutes. TECHNIQUE: The procedure, risks, benefits, and alternatives were explained to the patient. Questions regarding the procedure were encouraged and answered. The patient understands and consents to the procedure. As antibiotic prophylaxis, cefazolin 2 g was ordered pre-procedure and administered intravenously within one hour of incision. Patency of the right IJ vein was confirmed with ultrasound with image documentation. An appropriate skin site was determined. Skin  site was marked. Region was prepped using maximum barrier technique including cap and mask, sterile gown, sterile gloves, large sterile sheet, and Chlorhexidine as cutaneous antisepsis. The region was infiltrated locally with 1% lidocaine. Under real-time ultrasound guidance, the right IJ vein was accessed with a 21 gauge micropuncture needle; the needle tip within the vein was confirmed with ultrasound image documentation. Needle was exchanged over a 018 guidewire for transitional dilator which allowed passage of the [facility] wire into the IVC. Over this, the transitional dilator was exchanged for a 5 [place] MPA catheter. A small incision was made on the right anterior chest wall and a subcutaneous pocket fashioned. The power-injectable port was positioned and its catheter tunneled to the right IJ dermatotomy site. The MPA catheter was exchanged over an Amplatz wire for a peel-away sheath, through which the port catheter, which had been trimmed to the appropriate length, was advanced and positioned under fluoroscopy with its tip at the cavoatrial junction. Spot chest radiograph confirms good catheter position and no pneumothorax. The pocket was closed with deep interrupted and subcuticular continuous 3-0 Monocryl sutures. The port was flushed per protocol. The incisions were covered with Dermabond then covered with a sterile dressing. COMPLICATIONS: COMPLICATIONS None immediate IMPRESSION: Technically successful right IJ power-injectable port catheter placement. Ready for routine use. Electronically Signed   By: Lucrezia Europe M.D.   On: 05/21/2016 12:50   Ct Biopsy  Result Date: 05/21/2016 CLINICAL DATA:  Diffuse large B-cell lymphoma EXAM: CT GUIDED DEEP ILIAC BONE ASPIRATION AND CORE BIOPSY TECHNIQUE: Patient was placed supine on the CT gantry and limited axial scans through the pelvis were obtained. Appropriate skin entry site was identified. Skin site was marked, prepped with Betadine, draped in usual sterile  fashion, and infiltrated locally with 1% lidocaine. Intravenous Fentanyl and Versed were administered  as conscious sedation during continuous monitoring of the patient's level of consciousness and physiological / cardiorespiratory status by the radiology RN, with a total moderate sedation time of ten minutes. Under CT fluoroscopic guidance an 11-gauge Cook trocar bone needle was advanced into the right iliac bone just lateral to the sacroiliac joint. Once needle tip position was confirmed, coaxial core and aspiration samples were obtained. The final sample was obtained using the guiding needle itself, which was then removed. Post procedure scans show no hematoma or fracture. Patient tolerated procedure well. COMPLICATIONS: COMPLICATIONS none IMPRESSION: 1. Technically successful CT guided right iliac bone core and aspiration biopsy. Electronically Signed   By: Lucrezia Europe M.D.   On: 05/21/2016 12:47   Ct Bone Marrow Aspiration  Result Date: 05/21/2016 CLINICAL DATA:  Diffuse large B-cell lymphoma EXAM: CT GUIDED DEEP ILIAC BONE ASPIRATION AND CORE BIOPSY TECHNIQUE: Patient was placed supine on the CT gantry and limited axial scans through the pelvis were obtained. Appropriate skin entry site was identified. Skin site was marked, prepped with Betadine, draped in usual sterile fashion, and infiltrated locally with 1% lidocaine. Intravenous Fentanyl and Versed were administered as conscious sedation during continuous monitoring of the patient's level of consciousness and physiological / cardiorespiratory status by the radiology RN, with a total moderate sedation time of ten minutes. Under CT fluoroscopic guidance an 11-gauge Cook trocar bone needle was advanced into the right iliac bone just lateral to the sacroiliac joint. Once needle tip position was confirmed, coaxial core and aspiration samples were obtained. The final sample was obtained using the guiding needle itself, which was then removed. Post procedure  scans show no hematoma or fracture. Patient tolerated procedure well. COMPLICATIONS: COMPLICATIONS none IMPRESSION: 1. Technically successful CT guided right iliac bone core and aspiration biopsy. Electronically Signed   By: Lucrezia Europe M.D.   On: 05/21/2016 12:47   Ir Fluoro Guide Port Insertion Right  Result Date: 05/21/2016 CLINICAL DATA:  Diffuse large B-cell lymphoma, needs durable venous access for chemotherapy regimen EXAM: TUNNELED PORT CATHETER PLACEMENT WITH ULTRASOUND AND FLUOROSCOPIC GUIDANCE FLUOROSCOPY TIME:  0.1 minute (15 uGym2 DAP) ANESTHESIA/SEDATION: Intravenous Fentanyl and Versed were administered as conscious sedation during continuous monitoring of the patient's level of consciousness and physiological / cardiorespiratory status by the radiology RN, with a total moderate sedation time of 15 minutes. TECHNIQUE: The procedure, risks, benefits, and alternatives were explained to the patient. Questions regarding the procedure were encouraged and answered. The patient understands and consents to the procedure. As antibiotic prophylaxis, cefazolin 2 g was ordered pre-procedure and administered intravenously within one hour of incision. Patency of the right IJ vein was confirmed with ultrasound with image documentation. An appropriate skin site was determined. Skin site was marked. Region was prepped using maximum barrier technique including cap and mask, sterile gown, sterile gloves, large sterile sheet, and Chlorhexidine as cutaneous antisepsis. The region was infiltrated locally with 1% lidocaine. Under real-time ultrasound guidance, the right IJ vein was accessed with a 21 gauge micropuncture needle; the needle tip within the vein was confirmed with ultrasound image documentation. Needle was exchanged over a 018 guidewire for transitional dilator which allowed passage of the [facility] wire into the IVC. Over this, the transitional dilator was exchanged for a 5 [place] MPA catheter. A small  incision was made on the right anterior chest wall and a subcutaneous pocket fashioned. The power-injectable port was positioned and its catheter tunneled to the right IJ dermatotomy site. The MPA catheter was exchanged over an  Amplatz wire for a peel-away sheath, through which the port catheter, which had been trimmed to the appropriate length, was advanced and positioned under fluoroscopy with its tip at the cavoatrial junction. Spot chest radiograph confirms good catheter position and no pneumothorax. The pocket was closed with deep interrupted and subcuticular continuous 3-0 Monocryl sutures. The port was flushed per protocol. The incisions were covered with Dermabond then covered with a sterile dressing. COMPLICATIONS: COMPLICATIONS None immediate IMPRESSION: Technically successful right IJ power-injectable port catheter placement. Ready for routine use. Electronically Signed   By: Lucrezia Europe M.D.   On: 05/21/2016 12:50    ASSESSMENT/PLAN:    Hypersensitivity reaction Patient presented to the [facility] today to receive her first cycle of Rituxan infusion.  She developed one small area of hives between her shoulder blades, pruritus to the scalp, and a scratchy throat; in the infusion was held.  Confirmed the patient was premedicated with Tylenol and Benadryl 50 mg.  Patient was given Pepcid.  Insight Medrol per protocol; and all symptoms resolved.  Patient was able to proceed with her Rituxan infusion at that time.  Patient once again developed some mild redness only to the nape of her neck at her hairline and was given additional Benadryl 50 mg IV.  All symptoms once again resolved and patient was able to continue with her infusion as planned.  Diffuse large B cell lymphoma ([place]) Patient presented to the [facility] today to receive her first cycle of Rituxan infusion.  See further notes for details of today's visit.  She is scheduled to return for an injection on 05/26/2016.  She scheduled  for labs, flush, and a visit on 05/31/2016.   Patient stated understanding of all instructions; and was in agreement with this plan of care. The patient knows to call the clinic with any problems, questions or concerns.   Total time spent with patient was 40 minutes;  with greater than 75 percent of that time spent in face to face counseling regarding patient's symptoms,  and coordination of care and follow up.  Disclaimer:This dictation was prepared with Dragon/digital dictation along with [employer]. Any transcriptional errors that result from this process are unintentional.  Drue Second, NP 05/24/2016

## 2016-05-24 NOTE — Assessment & Plan Note (Signed)
Patient presented to the [facility] today to receive her first cycle of Rituxan infusion.  She developed one small area of hives between her shoulder blades, pruritus to the scalp, and a scratchy throat; in the infusion was held.  Confirmed the patient was premedicated with Tylenol and Benadryl 50 mg.  Patient was given Pepcid.  Insight Medrol per protocol; and all symptoms resolved.  Patient was able to proceed with her Rituxan infusion at that time.  Patient once again developed some mild redness only to the nape of her neck at her hairline and was given additional Benadryl 50 mg IV.  All symptoms once again resolved and patient was able to continue with her infusion as planned.

## 2016-05-24 NOTE — Progress Notes (Signed)
Patient complained of slight sore throat and her head itching. Rituxan infusion paused, Selena Lesser, NP notified. Selena Lesser, NP at chairside. Solu-medrol and pepcid given (see eMAR). VSS (see flowsheets). Infusion restarted per Selena Lesser, NP at 1455 at 150mg /hr. Patient began to feel itchy again at the base of her neck at 1600, with slight redness at the base of her neck. Selena Lesser, NP notified. Benadryl given (see eMAR) per Selena Lesser, NP. VSS. Symptoms resolved and infusion restarted at 1620 (see eMAR) at 150mg /hr.

## 2016-05-24 NOTE — Assessment & Plan Note (Addendum)
Patient presented to the [facility] today to receive her first cycle of Rituxan infusion.  See further notes for details of today's visit.  She is scheduled to return for an injection on 05/26/2016.  She scheduled for labs, flush, and a visit on 05/31/2016.

## 2016-05-25 ENCOUNTER — Encounter: Payer: Self-pay | Admitting: Hematology

## 2016-05-26 ENCOUNTER — Ambulatory Visit (HOSPITAL_BASED_OUTPATIENT_CLINIC_OR_DEPARTMENT_OTHER): Payer: Medicare Other

## 2016-05-26 ENCOUNTER — Encounter: Payer: Self-pay | Admitting: Hematology

## 2016-05-26 VITALS — BP 155/67 | HR 64 | Temp 97.7°F | Resp 18

## 2016-05-26 DIAGNOSIS — Z5189 Encounter for other specified aftercare: Secondary | ICD-10-CM | POA: Diagnosis not present

## 2016-05-26 DIAGNOSIS — C8331 Diffuse large B-cell lymphoma, lymph nodes of head, face, and neck: Secondary | ICD-10-CM

## 2016-05-26 DIAGNOSIS — C8338 Diffuse large B-cell lymphoma, lymph nodes of multiple sites: Secondary | ICD-10-CM

## 2016-05-26 MED ORDER — PEGFILGRASTIM INJECTION 6 MG/0.6ML ~~LOC~~
6.0000 mg | PREFILLED_SYRINGE | Freq: Once | SUBCUTANEOUS | Status: AC
  Administered 2016-05-26: 6 mg via SUBCUTANEOUS
  Filled 2016-05-26: qty 0.6

## 2016-05-26 NOTE — Patient Instructions (Signed)
Pegfilgrastim injection What is this medicine? PEGFILGRASTIM (PEG fil gra stim) is a long-acting granulocyte colony-stimulating factor that stimulates the growth of neutrophils, a type of white blood cell important in the body's fight against infection. It is used to reduce the incidence of fever and infection in patients with certain types of cancer who are receiving chemotherapy that affects the bone marrow, and to increase survival after being exposed to high doses of radiation. This medicine may be used for other purposes; ask your health care provider or pharmacist if you have questions. COMMON BRAND NAME(S): Neulasta What should I tell my health care provider before I take this medicine? They need to know if you have any of these conditions: -kidney disease -latex allergy -ongoing radiation therapy -sickle cell disease -skin reactions to acrylic adhesives (On-Body Injector only) -an unusual or allergic reaction to pegfilgrastim, filgrastim, other medicines, foods, dyes, or preservatives -pregnant or trying to get pregnant -breast-feeding How should I use this medicine? This medicine is for injection under the skin. If you get this medicine at home, you will be taught how to prepare and give the pre-filled syringe or how to use the On-body Injector. Refer to the patient Instructions for Use for detailed instructions. Use exactly as directed. Take your medicine at regular intervals. Do not take your medicine more often than directed. It is important that you put your used needles and syringes in a special sharps container. Do not put them in a trash can. If you do not have a sharps container, call your pharmacist or healthcare provider to get one. Talk to your pediatrician regarding the use of this medicine in children. While this drug may be prescribed for selected conditions, precautions do apply. Overdosage: If you think you have taken too much of this medicine contact a poison control  center or emergency room at once. NOTE: This medicine is only for you. Do not share this medicine with others. What if I miss a dose? It is important not to miss your dose. Call your doctor or health care professional if you miss your dose. If you miss a dose due to an On-body Injector failure or leakage, a new dose should be administered as soon as possible using a single prefilled syringe for manual use. What may interact with this medicine? Interactions have not been studied. Give your health care provider a list of all the medicines, herbs, non-prescription drugs, or dietary supplements you use. Also tell them if you smoke, drink alcohol, or use illegal drugs. Some items may interact with your medicine. This list may not describe all possible interactions. Give your health care provider a list of all the medicines, herbs, non-prescription drugs, or dietary supplements you use. Also tell them if you smoke, drink alcohol, or use illegal drugs. Some items may interact with your medicine. What should I watch for while using this medicine? You may need blood work done while you are taking this medicine. If you are going to need a MRI, CT scan, or other procedure, tell your doctor that you are using this medicine (On-Body Injector only). What side effects may I notice from receiving this medicine? Side effects that you should report to your doctor or health care professional as soon as possible: -allergic reactions like skin rash, itching or hives, swelling of the face, lips, or tongue -dizziness -fever -pain, redness, or irritation at site where injected -pinpoint red spots on the skin -red or dark-brown urine -shortness of breath or breathing problems -stomach or   side pain, or pain at the shoulder -swelling -tiredness -trouble passing urine or change in the amount of urine Side effects that usually do not require medical attention (report to your doctor or health care professional if they  continue or are bothersome): -bone pain -muscle pain This list may not describe all possible side effects. Call your doctor for medical advice about side effects. You may report side effects to FDA at 1-800-FDA-1088. Where should I keep my medicine? Keep out of the reach of children. Store pre-filled syringes in a refrigerator between 2 and 8 degrees C (36 and 46 degrees F). Do not freeze. Keep in carton to protect from light. Throw away this medicine if it is left out of the refrigerator for more than 48 hours. Throw away any unused medicine after the expiration date. NOTE: This sheet is a summary. It may not cover all possible information. If you have questions about this medicine, talk to your doctor, pharmacist, or health care provider.  2017 Elsevier/Gold Standard (2014-05-09 14:30:14)  

## 2016-05-26 NOTE — Progress Notes (Signed)
Pt is approved for the $400 [facility] grant.  °

## 2016-05-31 ENCOUNTER — Ambulatory Visit (HOSPITAL_BASED_OUTPATIENT_CLINIC_OR_DEPARTMENT_OTHER): Payer: Medicare Other | Admitting: Hematology

## 2016-05-31 ENCOUNTER — Telehealth: Payer: Self-pay | Admitting: Hematology

## 2016-05-31 ENCOUNTER — Encounter: Payer: Self-pay | Admitting: Hematology

## 2016-05-31 ENCOUNTER — Other Ambulatory Visit (HOSPITAL_BASED_OUTPATIENT_CLINIC_OR_DEPARTMENT_OTHER): Payer: Medicare Other

## 2016-05-31 ENCOUNTER — Other Ambulatory Visit: Payer: Self-pay | Admitting: *Deleted

## 2016-05-31 VITALS — BP 129/60 | HR 85 | Temp 98.5°F | Resp 18 | Ht 68.0 in | Wt 171.0 lb

## 2016-05-31 DIAGNOSIS — D701 Agranulocytosis secondary to cancer chemotherapy: Secondary | ICD-10-CM | POA: Diagnosis not present

## 2016-05-31 DIAGNOSIS — C8338 Diffuse large B-cell lymphoma, lymph nodes of multiple sites: Secondary | ICD-10-CM

## 2016-05-31 DIAGNOSIS — C8331 Diffuse large B-cell lymphoma, lymph nodes of head, face, and neck: Secondary | ICD-10-CM

## 2016-05-31 LAB — CBC WITH DIFFERENTIAL/PLATELET
BASO%: 0.1 % (ref 0.0–2.0)
Basophils Absolute: 0 10*3/uL (ref 0.0–0.1)
EOS%: 6.1 % (ref 0.0–7.0)
Eosinophils Absolute: 0.1 10*3/uL (ref 0.0–0.5)
HCT: 40.7 % (ref 34.8–46.6)
HGB: 13.6 g/dL (ref 11.6–15.9)
LYMPH%: 67.8 % — ABNORMAL HIGH (ref 14.0–49.7)
MCH: 30 pg (ref 25.1–34.0)
MCHC: 33.4 g/dL (ref 31.5–36.0)
MCV: 89.8 fL (ref 79.5–101.0)
MONO#: 0.2 10*3/uL (ref 0.1–0.9)
MONO%: 10.6 % (ref 0.0–14.0)
NEUT#: 0.2 10*3/uL — CL (ref 1.5–6.5)
NEUT%: 15.4 % — ABNORMAL LOW (ref 38.4–76.8)
Platelets: 123 10*3/uL — ABNORMAL LOW (ref 145–400)
RBC: 4.53 10*6/uL (ref 3.70–5.45)
RDW: 12.8 % (ref 11.2–14.5)
WBC: 1.6 10*3/uL — ABNORMAL LOW (ref 3.9–10.3)
lymph#: 1.1 10*3/uL (ref 0.9–3.3)

## 2016-05-31 LAB — COMPREHENSIVE METABOLIC PANEL
ALT: 15 U/L (ref 0–55)
AST: 10 U/L (ref 5–34)
Albumin: 4 g/dL (ref 3.5–5.0)
Alkaline Phosphatase: 97 U/L (ref 40–150)
Anion Gap: 10 mEq/L (ref 3–11)
BUN: 13.7 mg/dL (ref 7.0–26.0)
CO2: 25 mEq/L (ref 22–29)
Calcium: 9.8 mg/dL (ref 8.4–10.4)
Chloride: 101 mEq/L (ref 98–109)
Creatinine: 0.7 mg/dL (ref 0.6–1.1)
EGFR: 85 mL/min/{1.73_m2} — ABNORMAL LOW (ref >90)
Glucose: 142 mg/dl — ABNORMAL HIGH (ref 70–140)
Potassium: 3.8 mEq/L (ref 3.5–5.1)
Sodium: 137 mEq/L (ref 136–145)
Total Bilirubin: 0.79 mg/dL (ref 0.20–1.20)
Total Protein: 6.9 g/dL (ref 6.4–8.3)

## 2016-05-31 MED ORDER — DEXAMETHASONE 4 MG PO TABS: 12.0000 mg | ORAL_TABLET | Freq: Two times a day (BID) | ORAL | 0 refills | Status: DC

## 2016-05-31 NOTE — Progress Notes (Signed)
Lindsay Harrison    HEMATOLOGY/ONCOLOGY CLINIC NOTE  Date of Service: .05/31/2016  Patient Care Team: Lindsay Lukes, MD as PCP - General (Family Medicine) Lindsay Harrison M.D. (ENT)  CHIEF COMPLAINTS/PURPOSE OF CONSULTATION:  Diffuse large B-cell lymphoma   HISTORY OF PRESENTING ILLNESS:   Lindsay Harrison is a wonderful 71 y.o. female who has been referred to [place] by Dr Lindsay Belfast MD for evaluation and management of  of newly diagnosed diffuse large B-cell lymphoma.  Patient is an overall very healthy 72 year old lady with a history of iron deficiency anemia in the past due to heavy menses, dyslipidemia, vitamin D deficiency, PCOS and some element of psychosocial stress related to eating the primary caregiver for her husband with GBM.  Patient reports noticing the nodule below her chin first at the end of July 2017. She had this evaluated by her primary care physician and was given a referral to [facility] [place] [facility] who then referred her to Dr. Lovenia Shuck for lymph node biopsy. CT of the maxillo facial region without contrast on 01/02/2016 showed a 9 mm left paramedian level I lymph node.  She had her lymph node biopsy on 04/16/2016 by Dr. 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  She was referred to [place] with these findings. Patient notes that she has not overtly noted any other enlarged lymph nodes. No chest pain or shortness of breath no abdominal pain or distention. No change in bowel habits. No headaches or focal neurological deficits. No overt fevers or chills. No significant drenching night sweats.  INTERVAL HISTORY  Ms [name] is here after her 1st cycle of R-CHOP for a toxicity check. She notes some grade 1 fatigue and minimal mouth soreness and some night sweats.. Did had itching and localized hives from Rituxan which had resolved with solumedrol and benadryl. Had questions about  the use of infection prevention precautions which were addressed. Recommended against use of probiotics while on active chemotherapy. We decided on the use of pre-chemotherapy Dexamethasone prep and pre-medications to reduce risk of allergic reaction from Rituxan with next cycle. Labs today show no evidence of TLS and we discontinued her allopurinol.   MEDICAL HISTORY:  Past Medical History:  Diagnosis Date  . Anemia    h/o iron deficiency secondary to heavy menses  . Arthritis    left hand index finger  . Diffuse large B cell lymphoma ([place])   . Grief reaction 01/27/2016  . H/O measles    as a child  . History of chicken pox    as a child  . History of PCOS   . Hyperlipidemia, mixed 01/27/2016  . Lymphadenopathy   . Preventative health care 01/27/2016  . Vitamin D deficiency   . Wears glasses     SURGICAL HISTORY: Past Surgical History:  Procedure Laterality Date  . COLONOSCOPY    . IR GENERIC HISTORICAL  05/21/2016   IR FLUORO GUIDE PORT INSERTION RIGHT 05/21/2016 Lindsay Cleveland, MD [facility]  . IR GENERIC HISTORICAL  05/21/2016   IR US GUIDE VASC ACCESS RIGHT 05/21/2016 Lindsay Cleveland, MD [facility]  . SKIN BIOPSY Left    face  . THYROGLOSSAL DUCT CYST N/A 04/16/2016   Procedure: THYROGLOSSAL DUCT CYST excision;  Surgeon: Lindsay Belfast, MD;  Location[facility];  Service: ENT;  Laterality: N/A;    SOCIAL HISTORY: Social History   Social History  . Marital status: Married  Spouse name: N/A  . Number of children: N/A  . Years of education: N/A   Occupational History  . Psychotherapist    Social History Main Topics  . Smoking status: Never Smoker  . Smokeless tobacco: Never Used  . Alcohol use 1.8 - 3.6 oz/week    3 - 6 Standard drinks or equivalent per week     Comment: 5 glasses of wine a week.  . Drug use: No  . Sexual activity: Yes    Partners: Male    Birth control/ protection: None   Other Topics Concern  . Not on file   Social History Narrative     Married. Education: [employer]. Exercise: walking, yoga, and bicycling daily for 1-2 hours. Lives alone, works as Management consultant    FAMILY HISTORY: Family History  Problem Relation Age of Onset  . Arthritis Mother     rheumatoid  . Heart disease Father     CHF  . COPD Sister     Emphysema, h/o cigarettes  . Other Sister     pituatary tumor  . Arthritis Sister   . Obesity Sister   . Cancer Paternal Grandfather     cancer  . Stroke Maternal Aunt     ALLERGIES:  is allergic to erythromycin and other.  MEDICATIONS:  Current Outpatient Prescriptions  Medication Sig Dispense Refill  . aspirin 81 MG tablet Take 81 mg by mouth daily. Has stopped prior to procedure    . Calcium-Magnesium-Vitamin D (CALCIUM MAGNESIUM PO) Take 1 tablet by mouth daily.    . Cholecalciferol (VITAMIN D) 2000 units CAPS Take 2,000 Units by mouth daily.    . Cholecalciferol (VITAMIN D3) 50000 units CAPS Take 1 capsule by mouth once a week. (Patient taking differently: Take 1 capsule by mouth once a week. Friday) 4 capsule 4  . lidocaine-prilocaine (EMLA) cream Apply to affected area once 30 g 3  . LORazepam (ATIVAN) 0.5 MG tablet Take 1-2 tablets (0.5-1 mg total) by mouth every 8 (eight) hours. 30 tablet 0  . Omega-3 Fatty Acids (OMEGA-3 PO) Take 2 capsules by mouth daily. Omega 3 1280 mg    . ondansetron (ZOFRAN) 8 MG tablet Take 1 tablet (8 mg total) by mouth 2 (two) times daily as needed for refractory nausea / vomiting. Start on day 3 after chemotherapy 30 tablet 1  . predniSONE (DELTASONE) 20 MG tablet Take 4 tablets (80 mg total) by mouth daily. Take on days 1-5 of chemotherapy. 20 tablet 5  . prochlorperazine (COMPAZINE) 10 MG tablet Take 1 tablet (10 mg total) by mouth every 6 (six) hours as needed (Nausea or vomiting). 30 tablet 6  . dexamethasone (DECADRON) 4 MG tablet Take 3 tablets (12 mg total) by mouth 2 (two) times daily. Take one dose in the morning and one dose in the evening the day prior to  chemo. 6 tablet 0   No current facility-administered medications for this visit.     REVIEW OF SYSTEMS:    10 Point review of Systems was done is negative except as noted above.  PHYSICAL EXAMINATION: ECOG PERFORMANCE STATUS: 0 - Asymptomatic  . Vitals:   05/31/16 1523  BP: 129/60  Pulse: 85  Resp: 18  Temp: 98.5 F (36.9 C)   Filed Weights   05/31/16 1523  Weight: 171 lb (77.6 kg)   .Body mass index is 26 kg/m.  GENERAL:alert, in no acute distress and comfortable SKIN: skin color, texture, turgor are normal, no rashes or significant lesions EYES: normal,  conjunctiva are pink and non-injected, sclera clear OROPHARYNX:no exudate, no erythema and lips, buccal mucosa, and tongue normal  NECK: supple, no JVD, thyroid normal size, non-tender, without nodularity LYMPH:  no palpable lymphadenopathy in the cervical, axillary or inguinal LUNGS: clear to auscultation with normal respiratory effort HEART: regular rate & rhythm,  no murmurs and no lower extremity edema ABDOMEN: abdomen soft, non-tender, normoactive bowel sounds  Musculoskeletal: no cyanosis of digits and no clubbing  PSYCH: alert & oriented x 3 with fluent speech NEURO: no focal motor/sensory deficits  LABORATORY DATA:  I have reviewed the data as listed  . CBC Latest Ref Rng & Units 05/31/2016 05/24/2016 05/21/2016  WBC 3.9 - 10.3 10e3/uL 1.6(L) 4.2 4.4  Hemoglobin 11.6 - 15.9 g/dL 13.6 13.1 13.5  Hematocrit 34.8 - 46.6 % 40.7 38.5 39.4  Platelets 145 - 400 10e3/uL 123(L) 214 254   . CBC    Component Value Date/Time   WBC 1.6 (L) 05/31/2016 1500   WBC 4.4 05/21/2016 0928   RBC 4.53 05/31/2016 1500   RBC 4.45 05/21/2016 0928   HGB 13.6 05/31/2016 1500   HCT 40.7 05/31/2016 1500   PLT 123 (L) 05/31/2016 1500   MCV 89.8 05/31/2016 1500   MCH 30.0 05/31/2016 1500   MCH 30.3 05/21/2016 0928   MCHC 33.4 05/31/2016 1500   MCHC 34.3 05/21/2016 0928   RDW 12.8 05/31/2016 1500   LYMPHSABS 1.1 05/31/2016  1500   MONOABS 0.2 05/31/2016 1500   EOSABS 0.1 05/31/2016 1500   BASOSABS 0.0 05/31/2016 1500    . CMP Latest Ref Rng & Units 05/31/2016 05/24/2016 05/07/2016  Glucose 70 - 140 mg/dl 142(H) 107 93  BUN 7.0 - 26.0 mg/dL 13.7 10.5 14.1  Creatinine 0.6 - 1.1 mg/dL 0.7 0.7 0.8  Sodium 136 - 145 mEq/L 137 139 139  Potassium 3.5 - 5.1 mEq/L 3.8 4.3 4.1  Chloride 96 - 112 mEq/L - - -  CO2 22 - 29 mEq/L 25 27 26   Calcium 8.4 - 10.4 mg/dL 9.8 9.7 9.7  Total Protein 6.4 - 8.3 g/dL 6.9 6.8 7.5  Total Bilirubin 0.20 - 1.20 mg/dL 0.79 0.57 0.48  Alkaline Phos 40 - 150 U/L 97 92 124  AST 5 - 34 U/L 10 16 21   ALT 0 - 55 U/L 15 17 23    Component     Latest Ref Rng & Units 05/07/2016  Hepatitis B Surface Ag     Negative Negative  Hep B Core Ab, Tot     Negative Negative  Hep C Virus Ab     0.0 - 0.9 s/co ratio <0.1   . Lab Results  Component Value Date   LDH 180 05/24/2016         RADIOGRAPHIC STUDIES:   I have personally reviewed the radiological images as listed and agreed with the findings in the report. Nm Pet Image Initial (pi) Skull Base To Thigh  Result Date: 05/12/2016 CLINICAL DATA:  Initial treatment strategy for recently diagnosed diffuse large B-cell lymphoma. EXAM: NUCLEAR MEDICINE PET SKULL BASE TO THIGH TECHNIQUE: 9.2 mCi F-18 FDG was injected intravenously. Full-ring PET imaging was performed from the skull base to thigh after the radiotracer. CT data was obtained and used for attenuation correction and anatomic localization. FASTING BLOOD GLUCOSE:  Value: 98 mg/dl COMPARISON:  CT neck 01/02/2016 FINDINGS: NECK There is prominent but symmetric hypermetabolic activity within the lymphoid tissue of Waldeyer's ring. This has an SUV max of 6.4. There are calcifications within the  right palatine tonsil.There are several small hypermetabolic cervical lymph nodes, including a 6 mm right level IIb node (image 17, SUV max 5.0) and a 4 mm left level IIa node (image 23, SUV max 5.3).  Previously noted left paramedian level 1 node has apparently been removed. There is some residual low level activity in this area, presumably postoperative. CHEST There are small hypermetabolic lymph nodes within the left hilum (SUV max 4.7) and left axilla (SUV max 6.3). These nodes are not pathologically enlarged. No suspicious pulmonary activity. The lungs are clear. There is mild atherosclerosis of the aortic arch. ABDOMEN/PELVIS There is no hypermetabolic activity within the liver, adrenal glands, spleen or pancreas. The spleen is normal in size. There are no hypermetabolic or enlarged intra-abdominal lymph nodes. There is a small left inguinal node which is hypermetabolic (image 322, measuring 7 mm and demonstrating an SUV max of 5.5). SKELETON There is focal hypermetabolic activity within the left aspect of the sternal manubrium, near the sternomanubrial junction. This has an SUV max of 4.1 and correlates with a probable 10 mm sclerotic lesion in this area on CT (image 55). There is also hypermetabolic activity in the left iliac bone (SUV max 5.3). No discrete focal lesion in this area on CT. IMPRESSION: 1. Scattered small mildly hypermetabolic lymph nodes within the neck, left hilum, left axilla and left groin, potentially related the patient's lymphoma. 2. Focal hypermetabolic activity within the left sternal manubrium and left iliac bone, also potentially related to the patient's lymphoma. 3. No evidence of solid visceral organ involvement. Electronically Signed   By: Richardean Sale M.D.   On: 05/12/2016 16:21   Ir US Guide Vasc Access Right  Result Date: 05/21/2016 CLINICAL DATA:  Diffuse large B-cell lymphoma, needs durable venous access for chemotherapy regimen EXAM: TUNNELED PORT CATHETER PLACEMENT WITH ULTRASOUND AND FLUOROSCOPIC GUIDANCE FLUOROSCOPY TIME:  0.1 minute (15 uGym2 DAP) ANESTHESIA/SEDATION: Intravenous Fentanyl and Versed were administered as conscious sedation during continuous  monitoring of the patient's level of consciousness and physiological / cardiorespiratory status by the radiology RN, with a total moderate sedation time of 15 minutes. TECHNIQUE: The procedure, risks, benefits, and alternatives were explained to the patient. Questions regarding the procedure were encouraged and answered. The patient understands and consents to the procedure. As antibiotic prophylaxis, cefazolin 2 g was ordered pre-procedure and administered intravenously within one hour of incision. Patency of the right IJ vein was confirmed with ultrasound with image documentation. An appropriate skin site was determined. Skin site was marked. Region was prepped using maximum barrier technique including cap and mask, sterile gown, sterile gloves, large sterile sheet, and Chlorhexidine as cutaneous antisepsis. The region was infiltrated locally with 1% lidocaine. Under real-time ultrasound guidance, the right IJ vein was accessed with a 21 gauge micropuncture needle; the needle tip within the vein was confirmed with ultrasound image documentation. Needle was exchanged over a 018 guidewire for transitional dilator which allowed passage of the [facility] wire into the IVC. Over this, the transitional dilator was exchanged for a 5 [place] MPA catheter. A small incision was made on the right anterior chest wall and a subcutaneous pocket fashioned. The power-injectable port was positioned and its catheter tunneled to the right IJ dermatotomy site. The MPA catheter was exchanged over an Amplatz wire for a peel-away sheath, through which the port catheter, which had been trimmed to the appropriate length, was advanced and positioned under fluoroscopy with its tip at the cavoatrial junction. Spot chest radiograph confirms good catheter position  and no pneumothorax. The pocket was closed with deep interrupted and subcuticular continuous 3-0 Monocryl sutures. The port was flushed per protocol. The incisions were covered with  Dermabond then covered with a sterile dressing. COMPLICATIONS: COMPLICATIONS None immediate IMPRESSION: Technically successful right IJ power-injectable port catheter placement. Ready for routine use. Electronically Signed   By: Lucrezia Europe M.D.   On: 05/21/2016 12:50    *[place]*                  *[address]                        [place], [place] [address]                            [phone]  ------------------------------------------------------------------- Transthoracic Echocardiography  Patient:    Jayme, Mednick MR #:       [id number] Study Date: 05/14/2016 Gender:     F Age:        66 Height:     174 cm Weight:     79 kg BSA:        1.97 m^2 Pt. Status: Room:   SONOGRAPHER  Tresa Res, RDCS  PERFORMING   [facility], Outpatient  ATTENDING    Roselee Nova     [place], [place] Kishore  REFERRING    [place], [place] Kishore  cc:  ------------------------------------------------------------------- LV EF: 55% -   60%  ------------------------------------------------------------------- Indications:      Pre-op evaluation V728.1.  ------------------------------------------------------------------- History:   PMH:  Pre-op evaluation for cheomtherapy. No prior cardiac history.  ------------------------------------------------------------------- Study Conclusions  - Left ventricle: The cavity size was normal. Systolic function was   normal. The estimated ejection fraction was in the range of 55%   to 60%. Wall motion was normal; there were no regional wall   motion abnormalities. Left ventricular diastolic function   parameters were normal. - Left atrium: The atrium was moderately dilated. - Atrial septum: No defect or patent foramen ovale was identified. 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 Focal hypermetabolic activity within the left sternal manubrium and left iliac bone, also potentially related to the patient's lymphoma. No evidence of solid visceral organ involvement.  CT guided bone marrow biopsy done -- no evidence of lymphoma involving Bone marrow. Normal LDH. No constitutional symptoms. Patient has no significant medical comorbidities at baseline.  ECHO with nl EF as expected.  2) Rituxan hypersensitivity (mild grade 1 hives on the back - resolved with solumedrol and antihistamines.  3) Neutropenia related to chemotherapy PLAN -patient tolerated 1st cycle of R-CHOP withsome grade 1 rash to Rituxan but no other prohibitive toxicities -she is noted to be neutropenic and has reach nadir on her WBC/neutrophil counts. Has received Neulasta and we anticipate that her counts will bounce back rapidly. -counseled on infection prevention strategies -adjusted pre-meds for next cycle of chemotherapy -will give Dexamethasone '12mg'$  AM and PM on day prior to each cycle of  treatment. -will not transition to rapid infusion protocol for Rituxan. -no evidence of TLS - will discontinue Allopurinol to reduce cytopenia and other etiologies of allergic reactions. -recommended use of salt/baking soda mouthwash -- given receipe.  RTC with Dr Irene Limbo in 2 weeks C2D1 with labs. Continue chemotherapy per schedule.  . Orders Placed This Encounter  Procedures  . CBC & Diff and Retic    Standing Status:   Future    Standing Expiration Date:   05/31/2017  . Comprehensive metabolic panel    Standing Status:   Future    Standing Expiration Date:   05/31/2017    All of the patients questions were answered with apparent satisfaction. The patient knows to call the clinic with any  problems, questions or concerns.  I spent 20 minutes counseling the patient face to face. The total time spent in the appointment was 25 minutes and more than 50% was on counseling and direct patient cares.    Sullivan Lone MD [place] AAHIVMS [facility] Hematology/Oncology Physician [facility]  (Office):       [phone] (Work cell):  [phone] (Fax):           [phone]

## 2016-05-31 NOTE — Progress Notes (Signed)
[name]    HEMATOLOGY/ONCOLOGY CLINIC NOTE  Date of Service: .05/14/2016  Patient Care Team: [name], MD as PCP - General (Family Medicine) [name] M.D. (ENT)  CHIEF COMPLAINTS/PURPOSE OF CONSULTATION:  Diffuse large B-cell lymphoma   HISTORY OF PRESENTING ILLNESS:   Lindsay Harrison is a wonderful 71 y.o. female who has been referred to [place] by Dr [name] MD for evaluation and management of  of newly diagnosed diffuse large B-cell lymphoma.  Patient is an overall very healthy 71 year old lady with a history of iron deficiency anemia in the past due to heavy menses, dyslipidemia, vitamin D deficiency, PCOS and some element of psychosocial stress related to eating the primary caregiver for her husband with GBM.  Patient reports noticing the nodule below her chin first at the end of July 2017. She had this evaluated by her primary care physician and was given a referral to [facility] [place] [facility] who then referred her to Dr. Lovenia Shuck for lymph node biopsy. CT of the maxillo facial region without contrast on 01/02/2016 showed a 9 mm left paramedian level I lymph node.  She had her lymph node biopsy on 04/16/2016 by Dr. 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  She was referred to [place] with these findings. Patient notes that she has not overtly noted any other enlarged lymph nodes. No chest pain or shortness of breath no abdominal pain or distention. No change in bowel habits. No headaches or focal neurological deficits. No overt fevers or chills. No significant drenching night sweats.  INTERVAL HISTORY  Ms Marek is here after her PET/CT scan to results and develop a treatment plan. She was seen by Dr. Dellis Filbert at [facility] for second opinion. We discussed a PET CT scan results in details and went over the image findings. We discussed the pros and cons of getting a bone marrow  examination and she was agreeable to this. We discussed in detail treatment recommendations, alternatives, pre-treatment testing, potential toxicities and prognosis in details. She was recommended R-CHOP 6 cycles and consideration for possible use of maintenance Rituxan for the follicular lymphoma component thereafter.   MEDICAL HISTORY:  Past Medical History:  Diagnosis Date  . Anemia    h/o iron deficiency secondary to heavy menses  . Arthritis    left hand index finger  . Diffuse large B cell lymphoma ([place])   . Grief reaction 01/27/2016  . H/O measles    as a child  . History of chicken pox    as a child  . History of PCOS   . Hyperlipidemia, mixed 01/27/2016  . Lymphadenopathy   . Preventative health care 01/27/2016  . Vitamin D deficiency   . Wears glasses     SURGICAL HISTORY: Past Surgical History:  Procedure Laterality Date  . COLONOSCOPY    . IR GENERIC HISTORICAL  05/21/2016   IR FLUORO GUIDE PORT INSERTION RIGHT 05/21/2016 Arne Cleveland, MD [facility]-INTERV RAD  . IR GENERIC HISTORICAL  05/21/2016   IR US GUIDE VASC ACCESS RIGHT 05/21/2016 Arne Cleveland, MD [facility]  . SKIN BIOPSY Left    face  . THYROGLOSSAL DUCT CYST N/A 04/16/2016   Procedure: THYROGLOSSAL DUCT CYST excision;  Surgeon: [name], MD;  Location: [facility] OR;  Service: ENT;  Laterality: N/A;    SOCIAL HISTORY: Social History   Social History  . Marital status: Married    Spouse name: N/A  .  Number of children: N/A  . Years of education: N/A   Occupational History  . Psychotherapist    Social History Main Topics  . Smoking status: Never Smoker  . Smokeless tobacco: Never Used  . Alcohol use 1.8 - 3.6 oz/week    3 - 6 Standard drinks or equivalent per week     Comment: 5 glasses of wine a week.  . Drug use: No  . Sexual activity: Yes    Partners: Male    Birth control/ protection: None   Other Topics Concern  . Not on file   Social History Narrative   Married. Education: College.  Exercise: walking, yoga, and bicycling daily for 1-2 hours. Lives alone, works as psychotherapist    FAMILY HISTORY: Family History  Problem Relation Age of Onset  . Arthritis Mother     rheumatoid  . Heart disease Father     CHF  . COPD Sister     Emphysema, h/o cigarettes  . Other Sister     pituatary tumor  . Arthritis Sister   . Obesity Sister   . Cancer Paternal Grandfather     cancer  . Stroke Maternal Aunt     ALLERGIES:  is allergic to erythromycin and other.  MEDICATIONS:  Current Outpatient Prescriptions  Medication Sig Dispense Refill  . allopurinol (ZYLOPRIM) 100 MG tablet Take 1 tablet (100 mg total) by mouth 2 (two) times daily. 30 tablet 0  . aspirin 81 MG tablet Take 81 mg by mouth daily. Has stopped prior to procedure    . Calcium-Magnesium-Vitamin D (CALCIUM MAGNESIUM PO) Take 1 tablet by mouth daily.    . Cholecalciferol (VITAMIN D) 2000 units CAPS Take 2,000 Units by mouth daily.    . Cholecalciferol (VITAMIN D3) 50000 units CAPS Take 1 capsule by mouth once a week. (Patient taking differently: Take 1 capsule by mouth once a week. Friday) 4 capsule 4  . lidocaine-prilocaine (EMLA) cream Apply to affected area once 30 g 3  . LORazepam (ATIVAN) 0.5 MG tablet Take 1-2 tablets (0.5-1 mg total) by mouth every 8 (eight) hours. 30 tablet 0  . Omega-3 Fatty Acids (OMEGA-3 PO) Take 2 capsules by mouth daily. Omega 3 1280 mg    . ondansetron (ZOFRAN) 8 MG tablet Take 1 tablet (8 mg total) by mouth 2 (two) times daily as needed for refractory nausea / vomiting. Start on day 3 after chemotherapy 30 tablet 1  . predniSONE (DELTASONE) 20 MG tablet Take 4 tablets (80 mg total) by mouth daily. Take on days 1-5 of chemotherapy. 20 tablet 5  . prochlorperazine (COMPAZINE) 10 MG tablet Take 1 tablet (10 mg total) by mouth every 6 (six) hours as needed (Nausea or vomiting). 30 tablet 6   No current facility-administered medications for this visit.     REVIEW OF SYSTEMS:     10 Point review of Systems was done is negative except as noted above.  PHYSICAL EXAMINATION: ECOG PERFORMANCE STATUS: 0 - Asymptomatic  . Vitals:   05/14/16 1238  BP: (!) 146/72  Pulse: 67  Resp: 18  Temp: 98.9 F (37.2 C)   Filed Weights   05/14/16 1238  Weight: 171 lb 14.4 oz (78 kg)   .Body mass index is 25.76 kg/m.  GENERAL:alert, in no acute distress and comfortable SKIN: skin color, texture, turgor are normal, no rashes or significant lesions EYES: normal, conjunctiva are pink and non-injected, sclera clear OROPHARYNX:no exudate, no erythema and lips, buccal mucosa, and tongue normal  NECK:   supple, no JVD, thyroid normal size, non-tender, without nodularity LYMPH:  no palpable lymphadenopathy in the cervical, axillary or inguinal LUNGS: clear to auscultation with normal respiratory effort HEART: regular rate & rhythm,  no murmurs and no lower extremity edema ABDOMEN: abdomen soft, non-tender, normoactive bowel sounds  Musculoskeletal: no cyanosis of digits and no clubbing  PSYCH: alert & oriented x 3 with fluent speech NEURO: no focal motor/sensory deficits  LABORATORY DATA:  I have reviewed the data as listed  . CBC Latest Ref Rng & Units 05/24/2016 05/21/2016 05/07/2016  WBC 3.9 - 10.3 10e3/uL 4.2 4.4 6.3  Hemoglobin 11.6 - 15.9 g/dL 13.1 13.5 13.7  Hematocrit 34.8 - 46.6 % 38.5 39.4 40.0  Platelets 145 - 400 10e3/uL 214 254 305    . CMP Latest Ref Rng & Units 05/24/2016 05/07/2016 01/27/2016  Glucose 70 - 140 mg/dl 107 93 87  BUN 7.0 - 26.0 mg/dL 10.5 14.1 13  Creatinine 0.6 - 1.1 mg/dL 0.7 0.8 0.75  Sodium 136 - 145 mEq/L 139 139 138  Potassium 3.5 - 5.1 mEq/L 4.3 4.1 4.6  Chloride 96 - 112 mEq/L - - 102  CO2 22 - 29 mEq/L 27 26 30  Calcium 8.4 - 10.4 mg/dL 9.7 9.7 9.4  Total Protein 6.4 - 8.3 g/dL 6.8 7.5 7.4  Total Bilirubin 0.20 - 1.20 mg/dL 0.57 0.48 0.8  Alkaline Phos 40 - 150 U/L 92 124 83  AST 5 - 34 U/L 16 21 17  ALT 0 - 55 U/L 17 23 16    Component     Latest Ref Rng & Units 05/07/2016  Hepatitis B Surface Ag     Negative Negative  Hep B Core Ab, Tot     Negative Negative  Hep C Virus Ab     0.0 - 0.9 s/co ratio <0.1   . Lab Results  Component Value Date   LDH 180 05/24/2016        RADIOGRAPHIC STUDIES:   I have personally reviewed the radiological images as listed and agreed with the findings in the report. Nm Pet Image Initial (pi) Skull Base To Thigh  Result Date: 05/12/2016 CLINICAL DATA:  Initial treatment strategy for recently diagnosed diffuse large B-cell lymphoma. EXAM: NUCLEAR MEDICINE PET SKULL BASE TO THIGH TECHNIQUE: 9.2 mCi F-18 FDG was injected intravenously. Full-ring PET imaging was performed from the skull base to thigh after the radiotracer. CT data was obtained and used for attenuation correction and anatomic localization. FASTING BLOOD GLUCOSE:  Value: 98 mg/dl COMPARISON:  CT neck 01/02/2016 FINDINGS: NECK There is prominent but symmetric hypermetabolic activity within the lymphoid tissue of Waldeyer's ring. This has an SUV max of 6.4. There are calcifications within the right palatine tonsil.There are several small hypermetabolic cervical lymph nodes, including a 6 mm right level IIb node (image 17, SUV max 5.0) and a 4 mm left level IIa node (image 23, SUV max 5.3). Previously noted left paramedian level 1 node has apparently been removed. There is some residual low level activity in this area, presumably postoperative. CHEST There are small hypermetabolic lymph nodes within the left hilum (SUV max 4.7) and left axilla (SUV max 6.3). These nodes are not pathologically enlarged. No suspicious pulmonary activity. The lungs are clear. There is mild atherosclerosis of the aortic arch. ABDOMEN/PELVIS There is no hypermetabolic activity within the liver, adrenal glands, spleen or pancreas. The spleen is normal in size. There are no hypermetabolic or enlarged intra-abdominal lymph nodes. There is a small    left inguinal node which is hypermetabolic (image 188, measuring 7 mm and demonstrating an SUV max of 5.5). SKELETON There is focal hypermetabolic activity within the left aspect of the sternal manubrium, near the sternomanubrial junction. This has an SUV max of 4.1 and correlates with a probable 10 mm sclerotic lesion in this area on CT (image 55). There is also hypermetabolic activity in the left iliac bone (SUV max 5.3). No discrete focal lesion in this area on CT. IMPRESSION: 1. Scattered small mildly hypermetabolic lymph nodes within the neck, left hilum, left axilla and left groin, potentially related the patient's lymphoma. 2. Focal hypermetabolic activity within the left sternal manubrium and left iliac bone, also potentially related to the patient's lymphoma. 3. No evidence of solid visceral organ involvement. Electronically Signed   By: William  Veazey M.D.   On: 05/12/2016 16:21   Ir Us Guide Vasc Access Right  Result Date: 05/21/2016 CLINICAL DATA:  Diffuse large B-cell lymphoma, needs durable venous access for chemotherapy regimen EXAM: TUNNELED PORT CATHETER PLACEMENT WITH ULTRASOUND AND FLUOROSCOPIC GUIDANCE FLUOROSCOPY TIME:  0.1 minute (15 uGym2 DAP) ANESTHESIA/SEDATION: Intravenous Fentanyl and Versed were administered as conscious sedation during continuous monitoring of the patient's level of consciousness and physiological / cardiorespiratory status by the radiology RN, with a total moderate sedation time of 15 minutes. TECHNIQUE: The procedure, risks, benefits, and alternatives were explained to the patient. Questions regarding the procedure were encouraged and answered. The patient understands and consents to the procedure. As antibiotic prophylaxis, cefazolin 2 g was ordered pre-procedure and administered intravenously within one hour of incision. Patency of the right IJ vein was confirmed with ultrasound with image documentation. An appropriate skin site was determined. Skin site was  marked. Region was prepped using maximum barrier technique including cap and mask, sterile gown, sterile gloves, large sterile sheet, and Chlorhexidine as cutaneous antisepsis. The region was infiltrated locally with 1% lidocaine. Under real-time ultrasound guidance, the right IJ vein was accessed with a 21 gauge micropuncture needle; the needle tip within the vein was confirmed with ultrasound image documentation. Needle was exchanged over a 018 guidewire for transitional dilator which allowed passage of the Benson wire into the IVC. Over this, the transitional dilator was exchanged for a 5 French MPA catheter. A small incision was made on the right anterior chest wall and a subcutaneous pocket fashioned. The power-injectable port was positioned and its catheter tunneled to the right IJ dermatotomy site. The MPA catheter was exchanged over an Amplatz wire for a peel-away sheath, through which the port catheter, which had been trimmed to the appropriate length, was advanced and positioned under fluoroscopy with its tip at the cavoatrial junction. Spot chest radiograph confirms good catheter position and no pneumothorax. The pocket was closed with deep interrupted and subcuticular continuous 3-0 Monocryl sutures. The port was flushed per protocol. The incisions were covered with Dermabond then covered with a sterile dressing. COMPLICATIONS: COMPLICATIONS None immediate IMPRESSION: Technically successful right IJ power-injectable port catheter placement. Ready for routine use. Electronically Signed   By: D  Hassell M.D.   On: 05/21/2016 12:50    *[facility]*                  *[facility]*                          [address]                          [place], [place] [address]                            [phone]  ------------------------------------------------------------------- Transthoracic Echocardiography  Patient:    Halteman, Annalese MR #:       [id number] Study Date:  05/14/2016 Gender:     F Age:        70 Height:     174 cm Weight:     79 kg BSA:        1.97 m^2 Pt. Status: Room:   SONOGRAPHER  Tony Brown, RDCS  PERFORMING   [facility], Outpatient  ATTENDING    Kale, Gautam Kishore  ORDERING     Kale, Gautam Kishore  REFERRING    Kale, Gautam Kishore  cc:  ------------------------------------------------------------------- LV EF: 55% -   60%  ------------------------------------------------------------------- Indications:      Pre-op evaluation V728.1.  ------------------------------------------------------------------- History:   PMH:  Pre-op evaluation for cheomtherapy. No prior cardiac history.  ------------------------------------------------------------------- Study Conclusions  - Left ventricle: The cavity size was normal. Systolic function was   normal. The estimated ejection fraction was in the range of 55%   to 60%. Wall motion was normal; there were no regional wall   motion abnormalities. Left ventricular diastolic function   parameters were normal. - Left atrium: The atrium was moderately dilated. - Atrial septum: No defect or patent foramen ovale was identified. 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 Focal hypermetabolic activity within the left sternal manubrium and left iliac bone, also potentially related to the patient's lymphoma. No evidence of solid visceral organ involvement.  Normal LDH. No constitutional symptoms. Patient has no significant medical comorbidities at baseline.  ECHO with nl EF as expected.  PLAN -We discussed in details diagnosis, staging, PET/Ct findings, treatment  recommendations, alternatives, pre-treatment testing, potential toxicities and prognosis in details. She was recommended R-CHOP 6 cycles with G-CSF support and consideration for possible use of maintenance Rituxan for the follicular lymphoma component thereafter. -ECHO with nl EF -port placement -CT guided bone marrow biopsy -She will need chemotherapy counseling appointment for R-CHOP -Dr Lamar's input noted from [facility]  2) anxiety - diagnosis related. Has some concerns with anxiety and depression related to her husband's long battle with GBM. -Notes that she is managing it okay thus far. 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 -She notes that she does not need any medications at this time   Port-a-cath placement in 3-4 days CT bone marrow biopsy in 3-4 days Chemotherapy counseling sometime next week for R-CHOP with G-CSF Starting R-CHOP with G-CSF from 05/24/2016 or 05/25/2016 with labs RTC with Dr Kale 7-9 days post C1 chemotherapy with labs for toxicity check . Orders Placed This Encounter  Procedures  . IR FLUORO GUIDE PORT INSERTION RIGHT    Standing Status:   Future    Number of Occurrences:   1    Standing Expiration Date:   07/12/2017    Order Specific Question:   Reason for Exam (SYMPTOM  OR DIAGNOSIS REQUIRED)    Answer:   Port-a-cath placement. Patient with newly diagnosed DLBCL needing chemotherapy    Order Specific Question:   Preferred Imaging Location?    Answer:     [facility]  . CT Biopsy    Standing Status:   Future    Number of Occurrences:   1    Standing Expiration Date:   05/14/2017    Order Specific Question:   Lab orders requested (DO NOT place separate lab orders, these will be automatically ordered during procedure specimen collection):    Answer:   Surgical Pathology    Comments:   flow cytometry, molecular studies as needed    Order Specific Question:   Reason for Exam (SYMPTOM  OR DIAGNOSIS REQUIRED)    Answer:   Unilateral iliac crest bone marrow  aspiration and biopsy for newly diagnosed lymphoma staging    Order Specific Question:   Preferred imaging location?    Answer:   [facility]  . CBC & Diff and Retic    Standing Status:   Future    Number of Occurrences:   1    Standing Expiration Date:   05/14/2017  . Comprehensive metabolic panel    Standing Status:   Future    Number of Occurrences:   1    Standing Expiration Date:   05/14/2017  . Lactate dehydrogenase    Standing Status:   Future    Number of Occurrences:   1    Standing Expiration Date:   05/14/2017  . CBC with Differential    Standing Status:   Standing    Number of Occurrences:   20    Standing Expiration Date:   05/15/2017  . Comprehensive metabolic panel    Standing Status:   Standing    Number of Occurrences:   20    Standing Expiration Date:   05/15/2017  . CBC with Differential ([facility])    Standing Status:   Standing    Number of Occurrences:   20    Standing Expiration Date:   05/15/2017  . Comprehensive metabolic panel    Standing Status:   Standing    Number of Occurrences:   20    Standing Expiration Date:   05/15/2017  . CBC with Differential    Standing Status:   Standing    Number of Occurrences:   20    Standing Expiration Date:   05/15/2017  . Comprehensive metabolic panel    Standing Status:   Standing    Number of Occurrences:   20    Standing Expiration Date:   05/15/2017  . PHYSICIAN COMMUNICATION ORDER    A baseline Echo/ Muga should be obtained prior to initiation of Anthracycline Chemotherapy  . PHYSICIAN COMMUNICATION ORDER    Hepatitis B Virus screening with HBsAg and anti-HBc recommended prior to treatment with rituximab (Rituxan), ofatumumab (Arzerra) or obinutuzumab (Gazyva).    All of the patients questions were answered with apparent satisfaction. The patient knows to call the clinic with any problems, questions or concerns.  I spent 30 minutes counseling the patient face to face. The total time spent in the  appointment was 40 minutes and more than 50% was on counseling and direct patient cares.    Gautam Kale MD MS AAHIVMS [facility] C[facility] Hematology/Oncology Physician [facility]  (Office):       [phone] (Work cell):  [phone] (Fax):           [phone]     

## 2016-05-31 NOTE — Telephone Encounter (Signed)
GAVE PATIENT AVS REPORT AND APPOINTMENTS FOR February. °

## 2016-06-01 LAB — CHROMOSOME ANALYSIS, BONE MARROW

## 2016-06-03 ENCOUNTER — Encounter (HOSPITAL_COMMUNITY): Payer: Self-pay

## 2016-06-14 ENCOUNTER — Encounter: Payer: Self-pay | Admitting: Hematology

## 2016-06-14 ENCOUNTER — Ambulatory Visit (HOSPITAL_BASED_OUTPATIENT_CLINIC_OR_DEPARTMENT_OTHER): Payer: Medicare Other | Admitting: Hematology

## 2016-06-14 ENCOUNTER — Other Ambulatory Visit (HOSPITAL_BASED_OUTPATIENT_CLINIC_OR_DEPARTMENT_OTHER): Payer: Medicare Other

## 2016-06-14 ENCOUNTER — Telehealth: Payer: Self-pay | Admitting: Hematology

## 2016-06-14 ENCOUNTER — Ambulatory Visit (HOSPITAL_BASED_OUTPATIENT_CLINIC_OR_DEPARTMENT_OTHER): Payer: Medicare Other

## 2016-06-14 ENCOUNTER — Other Ambulatory Visit: Payer: Self-pay | Admitting: *Deleted

## 2016-06-14 ENCOUNTER — Ambulatory Visit: Payer: Medicare Other

## 2016-06-14 VITALS — BP 140/63 | HR 87 | Temp 98.0°F | Resp 18 | Ht 68.0 in | Wt 170.6 lb

## 2016-06-14 VITALS — BP 122/60 | HR 62 | Temp 98.7°F | Resp 17

## 2016-06-14 DIAGNOSIS — C8338 Diffuse large B-cell lymphoma, lymph nodes of multiple sites: Secondary | ICD-10-CM

## 2016-06-14 DIAGNOSIS — Z5111 Encounter for antineoplastic chemotherapy: Secondary | ICD-10-CM

## 2016-06-14 DIAGNOSIS — D701 Agranulocytosis secondary to cancer chemotherapy: Secondary | ICD-10-CM | POA: Diagnosis not present

## 2016-06-14 DIAGNOSIS — Z5112 Encounter for antineoplastic immunotherapy: Secondary | ICD-10-CM | POA: Diagnosis present

## 2016-06-14 DIAGNOSIS — T7840XA Allergy, unspecified, initial encounter: Secondary | ICD-10-CM

## 2016-06-14 LAB — CBC & DIFF AND RETIC
BASO%: 0 % (ref 0.0–2.0)
Basophils Absolute: 0 10*3/uL (ref 0.0–0.1)
EOS%: 0 % (ref 0.0–7.0)
Eosinophils Absolute: 0 10*3/uL (ref 0.0–0.5)
HCT: 33.9 % — ABNORMAL LOW (ref 34.8–46.6)
HGB: 11.8 g/dL (ref 11.6–15.9)
Immature Retic Fract: 8 % (ref 1.60–10.00)
LYMPH%: 4.1 % — ABNORMAL LOW (ref 14.0–49.7)
MCH: 30.4 pg (ref 25.1–34.0)
MCHC: 34.8 g/dL (ref 31.5–36.0)
MCV: 87.4 fL (ref 79.5–101.0)
MONO#: 0.5 10*3/uL (ref 0.1–0.9)
MONO%: 5.4 % (ref 0.0–14.0)
NEUT#: 7.8 10*3/uL — ABNORMAL HIGH (ref 1.5–6.5)
NEUT%: 90.5 % — ABNORMAL HIGH (ref 38.4–76.8)
Platelets: 426 10*3/uL — ABNORMAL HIGH (ref 145–400)
RBC: 3.88 10*6/uL (ref 3.70–5.45)
RDW: 13.5 % (ref 11.2–14.5)
Retic %: 1.58 % (ref 0.70–2.10)
Retic Ct Abs: 61.3 10*3/uL (ref 33.70–90.70)
WBC: 8.6 10*3/uL (ref 3.9–10.3)
lymph#: 0.4 10*3/uL — ABNORMAL LOW (ref 0.9–3.3)

## 2016-06-14 LAB — COMPREHENSIVE METABOLIC PANEL
ALT: 19 U/L (ref 0–55)
AST: 13 U/L (ref 5–34)
Albumin: 4 g/dL (ref 3.5–5.0)
Alkaline Phosphatase: 94 U/L (ref 40–150)
Anion Gap: 9 mEq/L (ref 3–11)
BUN: 15.9 mg/dL (ref 7.0–26.0)
CO2: 24 mEq/L (ref 22–29)
Calcium: 10.1 mg/dL (ref 8.4–10.4)
Chloride: 105 mEq/L (ref 98–109)
Creatinine: 0.7 mg/dL (ref 0.6–1.1)
EGFR: 86 mL/min/{1.73_m2} — ABNORMAL LOW (ref >90)
Glucose: 144 mg/dl — ABNORMAL HIGH (ref 70–140)
Potassium: 4 mEq/L (ref 3.5–5.1)
Sodium: 138 mEq/L (ref 136–145)
Total Bilirubin: 0.44 mg/dL (ref 0.20–1.20)
Total Protein: 6.8 g/dL (ref 6.4–8.3)

## 2016-06-14 MED ORDER — SODIUM CHLORIDE 0.9 % IV SOLN
Freq: Once | INTRAVENOUS | Status: AC
  Administered 2016-06-14: 10:00:00 via INTRAVENOUS

## 2016-06-14 MED ORDER — RITUXIMAB CHEMO INJECTION 500 MG/50ML
375.0000 mg/m2 | Freq: Once | INTRAVENOUS | Status: AC
  Administered 2016-06-14: 700 mg via INTRAVENOUS
  Filled 2016-06-14: qty 50

## 2016-06-14 MED ORDER — SODIUM CHLORIDE 0.9 % IV SOLN
12.0000 mg | Freq: Once | INTRAVENOUS | Status: AC
  Administered 2016-06-14: 12 mg via INTRAVENOUS
  Filled 2016-06-14: qty 1.2

## 2016-06-14 MED ORDER — SODIUM CHLORIDE 0.9% FLUSH
10.0000 mL | INTRAVENOUS | Status: DC | PRN
  Administered 2016-06-14: 10 mL
  Filled 2016-06-14: qty 10

## 2016-06-14 MED ORDER — ACETAMINOPHEN 325 MG PO TABS
ORAL_TABLET | ORAL | Status: AC
  Filled 2016-06-14: qty 2

## 2016-06-14 MED ORDER — ACETAMINOPHEN 325 MG PO TABS
650.0000 mg | ORAL_TABLET | Freq: Once | ORAL | Status: AC
  Administered 2016-06-14: 650 mg via ORAL

## 2016-06-14 MED ORDER — DOXORUBICIN HCL CHEMO IV INJECTION 2 MG/ML
50.0000 mg/m2 | Freq: Once | INTRAVENOUS | Status: AC
  Administered 2016-06-14: 98 mg via INTRAVENOUS
  Filled 2016-06-14: qty 49

## 2016-06-14 MED ORDER — DEXAMETHASONE 4 MG PO TABS: 12.0000 mg | ORAL_TABLET | Freq: Two times a day (BID) | ORAL | 0 refills | Status: DC

## 2016-06-14 MED ORDER — FAMOTIDINE IN NACL 20-0.9 MG/50ML-% IV SOLN
INTRAVENOUS | Status: AC
  Filled 2016-06-14: qty 50

## 2016-06-14 MED ORDER — VINCRISTINE SULFATE CHEMO INJECTION 1 MG/ML
2.0000 mg | Freq: Once | INTRAVENOUS | Status: AC
  Administered 2016-06-14: 2 mg via INTRAVENOUS
  Filled 2016-06-14: qty 2

## 2016-06-14 MED ORDER — DIPHENHYDRAMINE HCL 50 MG/ML IJ SOLN
INTRAMUSCULAR | Status: AC
  Filled 2016-06-14: qty 1

## 2016-06-14 MED ORDER — FAMOTIDINE IN NACL 20-0.9 MG/50ML-% IV SOLN
20.0000 mg | Freq: Once | INTRAVENOUS | Status: AC
  Administered 2016-06-14: 20 mg via INTRAVENOUS

## 2016-06-14 MED ORDER — SODIUM CHLORIDE 0.9 % IV SOLN
750.0000 mg/m2 | Freq: Once | INTRAVENOUS | Status: AC
  Administered 2016-06-14: 1460 mg via INTRAVENOUS
  Filled 2016-06-14: qty 73

## 2016-06-14 MED ORDER — HEPARIN SOD (PORK) LOCK FLUSH 100 UNIT/ML IV SOLN
500.0000 [IU] | Freq: Once | INTRAVENOUS | Status: AC | PRN
  Administered 2016-06-14: 500 [IU]
  Filled 2016-06-14: qty 5

## 2016-06-14 MED ORDER — DIPHENHYDRAMINE HCL 50 MG/ML IJ SOLN
50.0000 mg | Freq: Once | INTRAMUSCULAR | Status: AC
  Administered 2016-06-14: 50 mg via INTRAVENOUS

## 2016-06-14 MED ORDER — PALONOSETRON HCL INJECTION 0.25 MG/5ML
INTRAVENOUS | Status: AC
  Filled 2016-06-14: qty 5

## 2016-06-14 MED ORDER — DEXAMETHASONE SODIUM PHOSPHATE 10 MG/ML IJ SOLN: 12.0000 mg | Freq: Once | INTRAMUSCULAR | Status: DC

## 2016-06-14 MED ORDER — PALONOSETRON HCL INJECTION 0.25 MG/5ML
0.2500 mg | Freq: Once | INTRAVENOUS | Status: AC
  Administered 2016-06-14: 0.25 mg via INTRAVENOUS

## 2016-06-14 NOTE — Progress Notes (Signed)
[name]    HEMATOLOGY/ONCOLOGY CLINIC NOTE  Date of Service: .06/14/2016  Patient Care Team: [name], MD as PCP - General (Family Medicine) [name] M.D. (ENT)  CHIEF COMPLAINTS/PURPOSE OF CONSULTATION:  Diffuse large B-cell lymphoma   HISTORY OF PRESENTING ILLNESS:   Lindsay Harrison is a wonderful 71 y.o. female who has been referred to [place] by Dr [name] MD for evaluation and management of  of newly diagnosed diffuse large B-cell lymphoma.  Patient is an overall very healthy 72 year old lady with a history of iron deficiency anemia in the past due to heavy menses, dyslipidemia, vitamin D deficiency, PCOS and some element of psychosocial stress related to eating the primary caregiver for her husband with GBM.  Patient reports noticing the nodule below her chin first at the end of July 2017. She had this evaluated by her primary care physician and was given a referral to [facility] [place] [facility] who then referred her to Dr. Lovenia Shuck for lymph node biopsy. CT of the maxillo facial region without contrast on 01/02/2016 showed a 9 mm left paramedian level I lymph node.  She had her lymph node biopsy on 04/16/2016 by Dr. 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  She was referred to [place] with these findings. Patient notes that she has not overtly noted any other enlarged lymph nodes. No chest pain or shortness of breath no abdominal pain or distention. No change in bowel habits. No headaches or focal neurological deficits. No overt fevers or chills. No significant drenching night sweats.  INTERVAL HISTORY  Lindsay Harrison is here for follow-up prior to her second cycle of R CHOP chemotherapy. She notes some mild grade 1 fatigue in week 2 but no other acute prohibitive toxicities. No fevers or chills no night sweats. No weight loss. Has taken her dexamethasone premedication to  prevent Rituxan hypersensitivity.  MEDICAL HISTORY:  Past Medical History:  Diagnosis Date  . Anemia    h/o iron deficiency secondary to heavy menses  . Arthritis    left hand index finger  . Diffuse large B cell lymphoma ([place])   . Grief reaction 01/27/2016  . H/O measles    as a child  . History of chicken pox    as a child  . History of PCOS   . Hyperlipidemia, mixed 01/27/2016  . Lymphadenopathy   . Preventative health care 01/27/2016  . Vitamin D deficiency   . Wears glasses     SURGICAL HISTORY: Past Surgical History:  Procedure Laterality Date  . COLONOSCOPY    . IR GENERIC HISTORICAL  05/21/2016   IR FLUORO GUIDE PORT INSERTION RIGHT 05/21/2016 Arne Cleveland, MD [facility]INTER[facility]  . IR GENERIC HISTORICAL  05/21/2016   IR US GUIDE VASC ACCESS RIGHT 05/21/2016 Arne Cleveland, MD [facility]  . SKIN BIOPSY Left    face  . THYROGLOSSAL DUCT CYST N/A 04/16/2016   Procedure: THYROGLOSSAL DUCT CYST excision;  Surgeon: [name], MD;  Location: [facility] OR;  Service: ENT;  Laterality: N/A;    SOCIAL HISTORY: Social History   Social History  . Marital status: Married    Spouse name: N/A  . Number of children: N/A  . Years of education: N/A   Occupational History  . Psychotherapist    Social History Main Topics  . Smoking status: Never Smoker  . Smokeless tobacco: Never Used  . Alcohol use 1.8 - 3.6  oz/week    3 - 6 Standard drinks or equivalent per week     Comment: 5 glasses of wine a week.  . Drug use: No  . Sexual activity: Yes    Partners: Male    Birth control/ protection: None   Other Topics Concern  . Not on file   Social History Narrative   Married. Education: [employer]. Exercise: walking, yoga, and bicycling daily for 1-2 hours. Lives alone, works as Management consultant    FAMILY HISTORY: Family History  Problem Relation Age of Onset  . Arthritis Mother     rheumatoid  . Heart disease Father     CHF  . COPD Sister     Emphysema, h/o cigarettes  .  Other Sister     pituatary tumor  . Arthritis Sister   . Obesity Sister   . Cancer Paternal Grandfather     cancer  . Stroke Maternal Aunt     ALLERGIES:  is allergic to erythromycin and other.  MEDICATIONS:  Current Outpatient Prescriptions  Medication Sig Dispense Refill  . aspirin 81 MG tablet Take 81 mg by mouth daily. Has stopped prior to procedure    . Calcium-Magnesium-Vitamin D (CALCIUM MAGNESIUM PO) Take 1 tablet by mouth daily.    . Cholecalciferol (VITAMIN D) 2000 units CAPS Take 2,000 Units by mouth daily.    . Cholecalciferol (VITAMIN D3) 50000 units CAPS Take 1 capsule by mouth once a week. (Patient taking differently: Take 1 capsule by mouth once a week. Friday) 4 capsule 4  . lidocaine-prilocaine (EMLA) cream Apply to affected area once 30 g 3  . LORazepam (ATIVAN) 0.5 MG tablet Take 1-2 tablets (0.5-1 mg total) by mouth every 8 (eight) hours. 30 tablet 0  . Omega-3 Fatty Acids (OMEGA-3 PO) Take 2 capsules by mouth daily. Omega 3 1280 mg    . ondansetron (ZOFRAN) 8 MG tablet Take 1 tablet (8 mg total) by mouth 2 (two) times daily as needed for refractory nausea / vomiting. Start on day 3 after chemotherapy 30 tablet 1  . predniSONE (DELTASONE) 20 MG tablet Take 4 tablets (80 mg total) by mouth daily. Take on days 1-5 of chemotherapy. 20 tablet 5  . prochlorperazine (COMPAZINE) 10 MG tablet Take 1 tablet (10 mg total) by mouth every 6 (six) hours as needed (Nausea or vomiting). 30 tablet 6  . dexamethasone (DECADRON) 4 MG tablet Take 3 tablets (12 mg total) by mouth 2 (two) times daily. Take one dose in the morning and one dose in the evening the day prior to chemo. 6 tablet 0   No current facility-administered medications for this visit.     REVIEW OF SYSTEMS:    10 Point review of Systems was done is negative except as noted above.  PHYSICAL EXAMINATION: ECOG PERFORMANCE STATUS: 0 - Asymptomatic  . Vitals:   06/14/16 0901  BP: 140/63  Pulse: 87  Resp: 18    Temp: 98 F (36.7 C)   Filed Weights   06/14/16 0901  Weight: 170 lb 9.6 oz (77.4 kg)   .Body mass index is 25.94 kg/m.  GENERAL:alert, in no acute distress and comfortable SKIN: skin color, texture, turgor are normal, no rashes or significant lesions EYES: normal, conjunctiva are pink and non-injected, sclera clear OROPHARYNX:no exudate, no erythema and lips, buccal mucosa, and tongue normal  NECK: supple, no JVD, thyroid normal size, non-tender, without nodularity LYMPH:  no palpable lymphadenopathy in the cervical, axillary or inguinal LUNGS: clear to auscultation with normal  respiratory effort HEART: regular rate & rhythm,  no murmurs and no lower extremity edema ABDOMEN: abdomen soft, non-tender, normoactive bowel sounds  Musculoskeletal: no cyanosis of digits and no clubbing  PSYCH: alert & oriented x 3 with fluent speech NEURO: no focal motor/sensory deficits  LABORATORY DATA:  I have reviewed the data as listed  . CBC Latest Ref Rng & Units 06/14/2016 05/31/2016 05/24/2016  WBC 3.9 - 10.3 10e3/uL 8.6 1.6(L) 4.2  Hemoglobin 11.6 - 15.9 g/dL 11.8 13.6 13.1  Hematocrit 34.8 - 46.6 % 33.9(L) 40.7 38.5  Platelets 145 - 400 10e3/uL 426(H) 123(L) 214   . CBC    Component Value Date/Time   WBC 8.6 06/14/2016 0737   WBC 4.4 05/21/2016 0928   RBC 3.88 06/14/2016 0737   RBC 4.45 05/21/2016 0928   HGB 11.8 06/14/2016 0737   HCT 33.9 (L) 06/14/2016 0737   PLT 426 (H) 06/14/2016 0737   MCV 87.4 06/14/2016 0737   MCH 30.4 06/14/2016 0737   MCH 30.3 05/21/2016 0928   MCHC 34.8 06/14/2016 0737   MCHC 34.3 05/21/2016 0928   RDW 13.5 06/14/2016 0737   LYMPHSABS 0.4 (L) 06/14/2016 0737   MONOABS 0.5 06/14/2016 0737   EOSABS 0.0 06/14/2016 0737   BASOSABS 0.0 06/14/2016 0737    . CMP Latest Ref Rng & Units 06/14/2016 05/31/2016 05/24/2016  Glucose 70 - 140 mg/dl 144(H) 142(H) 107  BUN 7.0 - 26.0 mg/dL 15.9 13.7 10.5  Creatinine 0.6 - 1.1 mg/dL 0.7 0.7 0.7  Sodium 136 - 145  mEq/L 138 137 139  Potassium 3.5 - 5.1 mEq/L 4.0 3.8 4.3  Chloride 96 - 112 mEq/L - - -  CO2 22 - 29 mEq/L _0 Calcium 8.4 - 10.4 mg/dL 10.1 9.8 9.7  Total Protein 6.4 - 8.3 g/dL 6.8 6.9 6.8  Total Bilirubin 0.20 - 1.20 mg/dL 0.44 0.79 0.57  Alkaline Phos 40 - 150 U/L 94 97 92  AST 5 - 34 U/L _1 ALT 0 - 55 U/L _2 Component     Latest Ref Rng & Units 05/07/2016  Hepatitis B Surface Ag     Negative Negative  Hep B Core Ab, Tot     Negative Negative  Hep C Virus Ab     0.0 - 0.9 s/co ratio <0.1   . Lab Results  Component Value Date   LDH 180 05/24/2016         RADIOGRAPHIC STUDIES:   I have personally reviewed the radiological images as listed and agreed with the findings in the report. Nm Pet Image Initial (pi) Skull Base To Thigh  Result Date: 05/12/2016 CLINICAL DATA:  Initial treatment strategy for recently diagnosed diffuse large B-cell lymphoma. EXAM: NUCLEAR MEDICINE PET SKULL BASE TO THIGH TECHNIQUE: 9.2 mCi F-18 FDG was injected intravenously. Full-ring PET imaging was performed from the skull base to thigh after the radiotracer. CT data was obtained and used for attenuation correction and anatomic localization. FASTING BLOOD GLUCOSE:  Value: 98 mg/dl COMPARISON:  CT neck 01/02/2016 FINDINGS: NECK There is prominent but symmetric hypermetabolic activity within the lymphoid tissue of Waldeyer's ring. This has an SUV max of 6.4. There are calcifications within the right palatine tonsil.There are several small hypermetabolic cervical lymph nodes, including a 6 mm right level IIb node (image 17, SUV max 5.0) and a 4 mm left level IIa node (image 23, SUV max 5.3). Previously noted left paramedian level 1 node has apparently  been removed. There is some residual low level activity in this area, presumably postoperative. CHEST There are small hypermetabolic lymph nodes within the left hilum (SUV max 4.7) and left axilla (SUV max 6.3). These nodes are not  pathologically enlarged. No suspicious pulmonary activity. The lungs are clear. There is mild atherosclerosis of the aortic arch. ABDOMEN/PELVIS There is no hypermetabolic activity within the liver, adrenal glands, spleen or pancreas. The spleen is normal in size. There are no hypermetabolic or enlarged intra-abdominal lymph nodes. There is a small left inguinal node which is hypermetabolic (image 163, measuring 7 mm and demonstrating an SUV max of 5.5). SKELETON There is focal hypermetabolic activity within the left aspect of the sternal manubrium, near the sternomanubrial junction. This has an SUV max of 4.1 and correlates with a probable 10 mm sclerotic lesion in this area on CT (image 55). There is also hypermetabolic activity in the left iliac bone (SUV max 5.3). No discrete focal lesion in this area on CT. IMPRESSION: 1. Scattered small mildly hypermetabolic lymph nodes within the neck, left hilum, left axilla and left groin, potentially related the patient's lymphoma. 2. Focal hypermetabolic activity within the left sternal manubrium and left iliac bone, also potentially related to the patient's lymphoma. 3. No evidence of solid visceral organ involvement. Electronically Signed   By: Richardean Sale M.D.   On: 05/12/2016 16:21   Ir US Guide Vasc Access Right  Result Date: 05/21/2016 CLINICAL DATA:  Diffuse large B-cell lymphoma, needs durable venous access for chemotherapy regimen EXAM: TUNNELED PORT CATHETER PLACEMENT WITH ULTRASOUND AND FLUOROSCOPIC GUIDANCE FLUOROSCOPY TIME:  0.1 minute (15 uGym2 DAP) ANESTHESIA/SEDATION: Intravenous Fentanyl and Versed were administered as conscious sedation during continuous monitoring of the patient's level of consciousness and physiological / cardiorespiratory status by the radiology RN, with a total moderate sedation time of 15 minutes. TECHNIQUE: The procedure, risks, benefits, and alternatives were explained to the patient. Questions regarding the procedure were  encouraged and answered. The patient understands and consents to the procedure. As antibiotic prophylaxis, cefazolin 2 g was ordered pre-procedure and administered intravenously within one hour of incision. Patency of the right IJ vein was confirmed with ultrasound with image documentation. An appropriate skin site was determined. Skin site was marked. Region was prepped using maximum barrier technique including cap and mask, sterile gown, sterile gloves, large sterile sheet, and Chlorhexidine as cutaneous antisepsis. The region was infiltrated locally with 1% lidocaine. Under real-time ultrasound guidance, the right IJ vein was accessed with a 21 gauge micropuncture needle; the needle tip within the vein was confirmed with ultrasound image documentation. Needle was exchanged over a 018 guidewire for transitional dilator which allowed passage of the [facility] wire into the IVC. Over this, the transitional dilator was exchanged for a 5 [place] MPA catheter. A small incision was made on the right anterior chest wall and a subcutaneous pocket fashioned. The power-injectable port was positioned and its catheter tunneled to the right IJ dermatotomy site. The MPA catheter was exchanged over an Amplatz wire for a peel-away sheath, through which the port catheter, which had been trimmed to the appropriate length, was advanced and positioned under fluoroscopy with its tip at the cavoatrial junction. Spot chest radiograph confirms good catheter position and no pneumothorax. The pocket was closed with deep interrupted and subcuticular continuous 3-0 Monocryl sutures. The port was flushed per protocol. The incisions were covered with Dermabond then covered with a sterile dressing. COMPLICATIONS: COMPLICATIONS None immediate IMPRESSION: Technically successful right IJ power-injectable port  catheter placement. Ready for routine use. Electronically Signed   By: Lucrezia Europe M.D.   On: 05/21/2016 12:50    *[place]*                   *[address] [employer].                        [place], [place] [address]                            [phone]  ------------------------------------------------------------------- Transthoracic Echocardiography  Patient:    Avynn, Klassen MR #:       [id number] Study Date: 05/14/2016 Gender:     F Age:        50 Height:     174 cm Weight:     79 kg BSA:        1.97 m^2 Pt. Status: Room:   SONOGRAPHER  Tresa Res, RDCS  PERFORMING   [facility], Outpatient  ATTENDING    Roselee Nova     [place], [place] K[place]  REFERRING    [place], [place] Kishore  cc:  ------------------------------------------------------------------- LV EF: 55% -   60%  ------------------------------------------------------------------- Indications:      Pre-op evaluation V728.1.  ------------------------------------------------------------------- History:   PMH:  Pre-op evaluation for cheomtherapy. No prior cardiac history.  ------------------------------------------------------------------- Study Conclusions  - Left ventricle: The cavity size was normal. Systolic function was   normal. The estimated ejection fraction was in the range of 55%   to 60%. Wall motion was normal; there were no regional wall   motion abnormalities. Left ventricular diastolic function   parameters were normal. - Left atrium: The atrium was moderately dilated. - Atrial septum: No defect or patent foramen ovale was identified. 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 Focal hypermetabolic activity within the left sternal manubrium and left iliac bone,  also potentially related to the patient's lymphoma. No evidence of solid visceral organ involvement.  CT guided bone marrow biopsy done -- no evidence of lymphoma involving Bone marrow. Normal LDH. No constitutional symptoms. Patient has no significant medical comorbidities at baseline.  ECHO with nl EF as expected.  2) Rituxan hypersensitivity (mild grade 1 hives on the back - resolved with solumedrol and antihistamines.  3) Neutropenia related to chemotherapy PLAN -patient tolerated 1st cycle of R-CHOP with some grade 1 rash to Rituxan grade 1 fatigue but no other prohibitive toxicities. -Labs today are stable and she is good to proceed with her second cycle of R CHOP with Neulasta support -She has received dexamethasone premedication yesterday. Would do standard Rituxan protocol and not the rapid protocol. -counseled on infection prevention strategies -recommended use of salt/baking soda mouthwash for some throat raspiness -- given receipe. -We will plan to repeat a PET CT scan after 3 cycles of treatment to assess response.  RTC with Dr Irene Limbo in 3 weeks C3D1 with labs. Continue chemotherapy per schedule.  . No orders of the defined types  were placed in this encounter.   All of the patients questions were answered with apparent satisfaction. The patient knows to call the clinic with any problems, questions or concerns.  I spent 20 minutes counseling the patient face to face. The total time spent in the appointment was 20 minutes and more than 50% was on counseling and direct patient cares.    Sullivan Lone MD Between AAHIVMS [facility] Hematology/Oncology Physician [facility]  (Office):       [phone] (Work cell):  [phone] (Fax):           [phone]

## 2016-06-14 NOTE — Telephone Encounter (Signed)
Message sent to chemo scheduler to be added per 06/14/16 los. Appointments scheduled per 06/14/16 los. Patient will review appointments via [place].

## 2016-06-14 NOTE — Patient Instructions (Addendum)
[place] Discharge Instructions for Patients Receiving Chemotherapy  Today you received the following chemotherapy agents Adriamycin, Vincristine, Cytoxan and Rituxan   To help prevent nausea and vomiting after your treatment, we encourage you to take your nausea medication as directed. No Zofran/Ondansetron for 3 days. Take Compazine instead.    If you develop nausea and vomiting that is not controlled by your nausea medication, call the clinic.   BELOW ARE SYMPTOMS THAT SHOULD BE REPORTED IMMEDIATELY:  *FEVER GREATER THAN 100.5 F  *CHILLS WITH OR WITHOUT FEVER  NAUSEA AND VOMITING THAT IS NOT CONTROLLED WITH YOUR NAUSEA MEDICATION  *UNUSUAL SHORTNESS OF BREATH  *UNUSUAL BRUISING OR BLEEDING  TENDERNESS IN MOUTH AND THROAT WITH OR WITHOUT PRESENCE OF ULCERS  *URINARY PROBLEMS  *BOWEL PROBLEMS  UNUSUAL RASH Items with * indicate a potential emergency and should be followed up as soon as possible.  Feel free to call the clinic you have any questions or concerns. The clinic phone number is [phone].  Please show the [place] at check-in to the Emergency Department and triage nurse.

## 2016-06-16 ENCOUNTER — Ambulatory Visit (HOSPITAL_BASED_OUTPATIENT_CLINIC_OR_DEPARTMENT_OTHER): Payer: Medicare Other

## 2016-06-16 ENCOUNTER — Telehealth: Payer: Self-pay | Admitting: *Deleted

## 2016-06-16 VITALS — BP 135/62 | HR 62 | Temp 99.0°F | Resp 18

## 2016-06-16 DIAGNOSIS — Z5189 Encounter for other specified aftercare: Secondary | ICD-10-CM | POA: Diagnosis not present

## 2016-06-16 DIAGNOSIS — C8338 Diffuse large B-cell lymphoma, lymph nodes of multiple sites: Secondary | ICD-10-CM | POA: Diagnosis present

## 2016-06-16 MED ORDER — PEGFILGRASTIM INJECTION 6 MG/0.6ML ~~LOC~~
6.0000 mg | PREFILLED_SYRINGE | Freq: Once | SUBCUTANEOUS | Status: AC
  Administered 2016-06-16: 6 mg via SUBCUTANEOUS
  Filled 2016-06-16: qty 0.6

## 2016-06-16 NOTE — Patient Instructions (Signed)
Pegfilgrastim injection What is this medicine? PEGFILGRASTIM (PEG fil gra stim) is a long-acting granulocyte colony-stimulating factor that stimulates the growth of neutrophils, a type of white blood cell important in the body's fight against infection. It is used to reduce the incidence of fever and infection in patients with certain types of cancer who are receiving chemotherapy that affects the bone marrow, and to increase survival after being exposed to high doses of radiation. This medicine may be used for other purposes; ask your health care provider or pharmacist if you have questions. COMMON BRAND NAME(S): Neulasta What should I tell my health care provider before I take this medicine? They need to know if you have any of these conditions: -kidney disease -latex allergy -ongoing radiation therapy -sickle cell disease -skin reactions to acrylic adhesives (On-Body Injector only) -an unusual or allergic reaction to pegfilgrastim, filgrastim, other medicines, foods, dyes, or preservatives -pregnant or trying to get pregnant -breast-feeding How should I use this medicine? This medicine is for injection under the skin. If you get this medicine at home, you will be taught how to prepare and give the pre-filled syringe or how to use the On-body Injector. Refer to the patient Instructions for Use for detailed instructions. Use exactly as directed. Take your medicine at regular intervals. Do not take your medicine more often than directed. It is important that you put your used needles and syringes in a special sharps container. Do not put them in a trash can. If you do not have a sharps container, call your pharmacist or healthcare provider to get one. Talk to your pediatrician regarding the use of this medicine in children. While this drug may be prescribed for selected conditions, precautions do apply. Overdosage: If you think you have taken too much of this medicine contact a poison control  center or emergency room at once. NOTE: This medicine is only for you. Do not share this medicine with others. What if I miss a dose? It is important not to miss your dose. Call your doctor or health care professional if you miss your dose. If you miss a dose due to an On-body Injector failure or leakage, a new dose should be administered as soon as possible using a single prefilled syringe for manual use. What may interact with this medicine? Interactions have not been studied. Give your health care provider a list of all the medicines, herbs, non-prescription drugs, or dietary supplements you use. Also tell them if you smoke, drink alcohol, or use illegal drugs. Some items may interact with your medicine. This list may not describe all possible interactions. Give your health care provider a list of all the medicines, herbs, non-prescription drugs, or dietary supplements you use. Also tell them if you smoke, drink alcohol, or use illegal drugs. Some items may interact with your medicine. What should I watch for while using this medicine? You may need blood work done while you are taking this medicine. If you are going to need a MRI, CT scan, or other procedure, tell your doctor that you are using this medicine (On-Body Injector only). What side effects may I notice from receiving this medicine? Side effects that you should report to your doctor or health care professional as soon as possible: -allergic reactions like skin rash, itching or hives, swelling of the face, lips, or tongue -dizziness -fever -pain, redness, or irritation at site where injected -pinpoint red spots on the skin -red or dark-brown urine -shortness of breath or breathing problems -stomach or   side pain, or pain at the shoulder -swelling -tiredness -trouble passing urine or change in the amount of urine Side effects that usually do not require medical attention (report to your doctor or health care professional if they  continue or are bothersome): -bone pain -muscle pain This list may not describe all possible side effects. Call your doctor for medical advice about side effects. You may report side effects to FDA at 1-800-FDA-1088. Where should I keep my medicine? Keep out of the reach of children. Store pre-filled syringes in a refrigerator between 2 and 8 degrees C (36 and 46 degrees F). Do not freeze. Keep in carton to protect from light. Throw away this medicine if it is left out of the refrigerator for more than 48 hours. Throw away any unused medicine after the expiration date. NOTE: This sheet is a summary. It may not cover all possible information. If you have questions about this medicine, talk to your doctor, pharmacist, or health care provider.  2017 Elsevier/Gold Standard (2014-05-09 14:30:14)  

## 2016-06-16 NOTE — Telephone Encounter (Signed)
Per 2/12 LOS and staff message I have scheduled appt. Notified the scheduler

## 2016-06-22 ENCOUNTER — Ambulatory Visit: Payer: Medicare Other | Admitting: Family Medicine

## 2016-06-25 ENCOUNTER — Other Ambulatory Visit: Payer: Self-pay | Admitting: Family Medicine

## 2016-06-25 ENCOUNTER — Encounter: Payer: Self-pay | Admitting: Hematology

## 2016-06-25 ENCOUNTER — Other Ambulatory Visit: Payer: Self-pay | Admitting: Hematology

## 2016-06-25 NOTE — Telephone Encounter (Signed)
I went ahead and approved it. She is coming in next month and we will check. I believe she had it checked elsewhere

## 2016-06-25 NOTE — Telephone Encounter (Signed)
Advise on this refill cannot find a vitamin D lab done

## 2016-06-25 NOTE — Telephone Encounter (Signed)
Refill done.  

## 2016-07-05 ENCOUNTER — Telehealth: Payer: Self-pay | Admitting: Hematology

## 2016-07-05 ENCOUNTER — Ambulatory Visit: Payer: Medicare Other

## 2016-07-05 ENCOUNTER — Encounter: Payer: Self-pay | Admitting: Hematology

## 2016-07-05 ENCOUNTER — Ambulatory Visit (HOSPITAL_BASED_OUTPATIENT_CLINIC_OR_DEPARTMENT_OTHER): Payer: Medicare Other

## 2016-07-05 ENCOUNTER — Other Ambulatory Visit: Payer: Self-pay | Admitting: *Deleted

## 2016-07-05 ENCOUNTER — Ambulatory Visit (HOSPITAL_BASED_OUTPATIENT_CLINIC_OR_DEPARTMENT_OTHER): Payer: Medicare Other | Admitting: Hematology

## 2016-07-05 ENCOUNTER — Other Ambulatory Visit (HOSPITAL_BASED_OUTPATIENT_CLINIC_OR_DEPARTMENT_OTHER): Payer: Medicare Other

## 2016-07-05 VITALS — BP 105/47 | HR 65 | Temp 98.1°F | Resp 17

## 2016-07-05 VITALS — BP 152/83 | HR 75 | Temp 99.0°F | Resp 16 | Wt 170.6 lb

## 2016-07-05 DIAGNOSIS — Z5111 Encounter for antineoplastic chemotherapy: Secondary | ICD-10-CM

## 2016-07-05 DIAGNOSIS — D701 Agranulocytosis secondary to cancer chemotherapy: Secondary | ICD-10-CM | POA: Diagnosis not present

## 2016-07-05 DIAGNOSIS — Z5112 Encounter for antineoplastic immunotherapy: Secondary | ICD-10-CM | POA: Diagnosis not present

## 2016-07-05 DIAGNOSIS — C8338 Diffuse large B-cell lymphoma, lymph nodes of multiple sites: Secondary | ICD-10-CM

## 2016-07-05 LAB — CBC WITH DIFFERENTIAL/PLATELET
BASO%: 0.1 % (ref 0.0–2.0)
Basophils Absolute: 0 10*3/uL (ref 0.0–0.1)
EOS%: 0 % (ref 0.0–7.0)
Eosinophils Absolute: 0 10*3/uL (ref 0.0–0.5)
HCT: 34 % — ABNORMAL LOW (ref 34.8–46.6)
HGB: 11.7 g/dL (ref 11.6–15.9)
LYMPH%: 2.9 % — ABNORMAL LOW (ref 14.0–49.7)
MCH: 30.8 pg (ref 25.1–34.0)
MCHC: 34.4 g/dL (ref 31.5–36.0)
MCV: 89.5 fL (ref 79.5–101.0)
MONO#: 0.7 10*3/uL (ref 0.1–0.9)
MONO%: 8.4 % (ref 0.0–14.0)
NEUT#: 7.8 10*3/uL — ABNORMAL HIGH (ref 1.5–6.5)
NEUT%: 88.6 % — ABNORMAL HIGH (ref 38.4–76.8)
Platelets: 507 10*3/uL — ABNORMAL HIGH (ref 145–400)
RBC: 3.8 10*6/uL (ref 3.70–5.45)
RDW: 15.9 % — ABNORMAL HIGH (ref 11.2–14.5)
WBC: 8.8 10*3/uL (ref 3.9–10.3)
lymph#: 0.3 10*3/uL — ABNORMAL LOW (ref 0.9–3.3)

## 2016-07-05 LAB — COMPREHENSIVE METABOLIC PANEL
ALT: 21 U/L (ref 0–55)
AST: 12 U/L (ref 5–34)
Albumin: 3.8 g/dL (ref 3.5–5.0)
Alkaline Phosphatase: 101 U/L (ref 40–150)
Anion Gap: 11 mEq/L (ref 3–11)
BUN: 16.6 mg/dL (ref 7.0–26.0)
CO2: 22 mEq/L (ref 22–29)
Calcium: 10 mg/dL (ref 8.4–10.4)
Chloride: 104 mEq/L (ref 98–109)
Creatinine: 0.7 mg/dL (ref 0.6–1.1)
EGFR: 85 mL/min/{1.73_m2} — ABNORMAL LOW (ref >90)
Glucose: 136 mg/dl (ref 70–140)
Potassium: 4.3 mEq/L (ref 3.5–5.1)
Sodium: 137 mEq/L (ref 136–145)
Total Bilirubin: 0.38 mg/dL (ref 0.20–1.20)
Total Protein: 6.8 g/dL (ref 6.4–8.3)

## 2016-07-05 MED ORDER — CYCLOPHOSPHAMIDE CHEMO INJECTION 1 GM
750.0000 mg/m2 | Freq: Once | INTRAMUSCULAR | Status: AC
  Administered 2016-07-05: 1460 mg via INTRAVENOUS
  Filled 2016-07-05: qty 73

## 2016-07-05 MED ORDER — ACETAMINOPHEN 325 MG PO TABS
ORAL_TABLET | ORAL | Status: AC
  Filled 2016-07-05: qty 2

## 2016-07-05 MED ORDER — ACETAMINOPHEN 325 MG PO TABS
650.0000 mg | ORAL_TABLET | Freq: Once | ORAL | Status: AC
  Administered 2016-07-05: 650 mg via ORAL

## 2016-07-05 MED ORDER — DIPHENHYDRAMINE HCL 50 MG/ML IJ SOLN
INTRAMUSCULAR | Status: AC
  Filled 2016-07-05: qty 1

## 2016-07-05 MED ORDER — VINCRISTINE SULFATE CHEMO INJECTION 1 MG/ML
2.0000 mg | Freq: Once | INTRAVENOUS | Status: AC
  Administered 2016-07-05: 2 mg via INTRAVENOUS
  Filled 2016-07-05: qty 2

## 2016-07-05 MED ORDER — HEPARIN SOD (PORK) LOCK FLUSH 100 UNIT/ML IV SOLN
500.0000 [IU] | Freq: Once | INTRAVENOUS | Status: AC | PRN
  Administered 2016-07-05: 500 [IU]
  Filled 2016-07-05: qty 5

## 2016-07-05 MED ORDER — PALONOSETRON HCL INJECTION 0.25 MG/5ML
0.2500 mg | Freq: Once | INTRAVENOUS | Status: AC
  Administered 2016-07-05: 0.25 mg via INTRAVENOUS

## 2016-07-05 MED ORDER — DIPHENHYDRAMINE HCL 50 MG/ML IJ SOLN
50.0000 mg | Freq: Once | INTRAMUSCULAR | Status: AC
  Administered 2016-07-05: 50 mg via INTRAVENOUS

## 2016-07-05 MED ORDER — SODIUM CHLORIDE 0.9 % IV SOLN
12.0000 mg | Freq: Once | INTRAVENOUS | Status: AC
  Administered 2016-07-05: 12 mg via INTRAVENOUS
  Filled 2016-07-05: qty 1.2

## 2016-07-05 MED ORDER — PALONOSETRON HCL INJECTION 0.25 MG/5ML
INTRAVENOUS | Status: AC
  Filled 2016-07-05: qty 5

## 2016-07-05 MED ORDER — DOXORUBICIN HCL CHEMO IV INJECTION 2 MG/ML
50.0000 mg/m2 | Freq: Once | INTRAVENOUS | Status: AC
  Administered 2016-07-05: 98 mg via INTRAVENOUS
  Filled 2016-07-05: qty 49

## 2016-07-05 MED ORDER — FAMOTIDINE IN NACL 20-0.9 MG/50ML-% IV SOLN
20.0000 mg | Freq: Once | INTRAVENOUS | Status: AC
  Administered 2016-07-05: 20 mg via INTRAVENOUS

## 2016-07-05 MED ORDER — DEXAMETHASONE SODIUM PHOSPHATE 10 MG/ML IJ SOLN: 12.0000 mg | Freq: Once | INTRAMUSCULAR | Status: DC

## 2016-07-05 MED ORDER — SODIUM CHLORIDE 0.9% FLUSH
10.0000 mL | INTRAVENOUS | Status: DC | PRN
  Administered 2016-07-05: 10 mL
  Filled 2016-07-05: qty 10

## 2016-07-05 MED ORDER — SODIUM CHLORIDE 0.9 % IV SOLN
Freq: Once | INTRAVENOUS | Status: AC
  Administered 2016-07-05: 10:00:00 via INTRAVENOUS

## 2016-07-05 MED ORDER — FAMOTIDINE IN NACL 20-0.9 MG/50ML-% IV SOLN
INTRAVENOUS | Status: AC
  Filled 2016-07-05: qty 50

## 2016-07-05 MED ORDER — SODIUM CHLORIDE 0.9 % IV SOLN
375.0000 mg/m2 | Freq: Once | INTRAVENOUS | Status: AC
  Administered 2016-07-05: 700 mg via INTRAVENOUS
  Filled 2016-07-05: qty 20

## 2016-07-05 MED ORDER — DEXAMETHASONE 4 MG PO TABS: 12.0000 mg | ORAL_TABLET | Freq: Two times a day (BID) | ORAL | 0 refills | Status: DC

## 2016-07-05 MED FILL — DEXAMETHASONE 4 MG TABLET: 4 | 1 days supply | Qty: 6 | Fill #0

## 2016-07-05 NOTE — Patient Instructions (Signed)
[place] Discharge Instructions for Patients Receiving Chemotherapy  Today you received the following chemotherapy agents: Adriamycin, Vincristine, Cytoxan, Rituxan   To help prevent nausea and vomiting after your treatment, we encourage you to take your nausea medication as directed.    If you develop nausea and vomiting that is not controlled by your nausea medication, call the clinic.   BELOW ARE SYMPTOMS THAT SHOULD BE REPORTED IMMEDIATELY:  *FEVER GREATER THAN 100.5 F  *CHILLS WITH OR WITHOUT FEVER  NAUSEA AND VOMITING THAT IS NOT CONTROLLED WITH YOUR NAUSEA MEDICATION  *UNUSUAL SHORTNESS OF BREATH  *UNUSUAL BRUISING OR BLEEDING  TENDERNESS IN MOUTH AND THROAT WITH OR WITHOUT PRESENCE OF ULCERS  *URINARY PROBLEMS  *BOWEL PROBLEMS  UNUSUAL RASH Items with * indicate a potential emergency and should be followed up as soon as possible.  Feel free to call the clinic you have any questions or concerns. The clinic phone number is [phone].  Please show the [place] at check-in to the Emergency Department and triage nurse.

## 2016-07-05 NOTE — Progress Notes (Signed)
Pt tolerated infusion well. Pt and VS stable at time of discharge.

## 2016-07-05 NOTE — Progress Notes (Signed)
[name]    HEMATOLOGY/ONCOLOGY CLINIC NOTE  Date of Service: .07/05/2016  Patient Care Team: [name], MD as PCP - General (Family Medicine) [name] M.D. (ENT)  CHIEF COMPLAINTS/PURPOSE OF CONSULTATION:  Diffuse large B-cell lymphoma   HISTORY OF PRESENTING ILLNESS:   Lindsay Harrison is a wonderful 71 y.o. female who has been referred to [place] by Dr [name] MD for evaluation and management of  of newly diagnosed diffuse large B-cell lymphoma.  Patient is an overall very healthy 71 year old lady with a history of iron deficiency anemia in the past due to heavy menses, dyslipidemia, vitamin D deficiency, PCOS and some element of psychosocial stress related to eating the primary caregiver for her husband with GBM.  Patient reports noticing the nodule below her chin first at the end of July 2017. She had this evaluated by her primary care physician and was given a referral to [facility] [place] [facility] who then referred her to Dr. Lovenia Shuck for lymph node biopsy. CT of the maxillo facial region without contrast on 01/02/2016 showed a 9 mm left paramedian level I lymph node.  She had her lymph node biopsy on 04/16/2016 by Dr. 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  She was referred to [place] with these findings. Patient notes that she has not overtly noted any other enlarged lymph nodes. No chest pain or shortness of breath no abdominal pain or distention. No change in bowel habits. No headaches or focal neurological deficits. No overt fevers or chills. No significant drenching night sweats.  INTERVAL HISTORY  Lindsay Harrison is here for follow-up prior to her 3rd cycle of R CHOP chemotherapy. She notes some mild fatigue and mild constipation but no other acute prohibitive toxicities. No fevers or chills no night sweats. No weight loss. No significant issues with Rituxan and the  second cycle after dexamethasone premedication. We discussed that we shall keep her Rituxan at standard dosing and not do the rapid protocol. Some issues with insomnia for a couple days while on high-dose prednisone. Noted that the Ativan didn't help much and she had some bad dream but wants to try it again prior to potentially switching medications.   MEDICAL HISTORY:  Past Medical History:  Diagnosis Date  . Anemia    h/o iron deficiency secondary to heavy menses  . Arthritis    left hand index finger  . Diffuse large B cell lymphoma ([place])   . Grief reaction 01/27/2016  . H/O measles    as a child  . History of chicken pox    as a child  . History of PCOS   . Hyperlipidemia, mixed 01/27/2016  . Lymphadenopathy   . Preventative health care 01/27/2016  . Vitamin D deficiency   . Wears glasses     SURGICAL HISTORY: Past Surgical History:  Procedure Laterality Date  . COLONOSCOPY    . IR GENERIC HISTORICAL  05/21/2016   IR FLUORO GUIDE PORT INSERTION RIGHT 05/21/2016 Arne Cleveland, MD [facility]  . IR GENERIC HISTORICAL  05/21/2016   IR US GUIDE VASC ACCESS RIGHT 05/21/2016 Arne Cleveland, MD [facility]  . SKIN BIOPSY Left    face  . THYROGLOSSAL DUCT CYST N/A 04/16/2016   Procedure: THYROGLOSSAL DUCT CYST excision;  Surgeon: [name], MD;  Location[facility] OR;  Service: ENT;  Laterality: N/A;    SOCIAL HISTORY: Social History   Social History  .  Marital status: Married    Spouse name: N/A  . Number of children: N/A  . Years of education: N/A   Occupational History  . Psychotherapist    Social History Main Topics  . Smoking status: Never Smoker  . Smokeless tobacco: Never Used  . Alcohol use 1.8 - 3.6 oz/week    3 - 6 Standard drinks or equivalent per week     Comment: 5 glasses of wine a week.  . Drug use: No  . Sexual activity: Yes    Partners: Male    Birth control/ protection: None   Other Topics Concern  . Not on file   Social History Narrative     Married. Education: [employer]. Exercise: walking, yoga, and bicycling daily for 1-2 hours. Lives alone, works as Management consultant    FAMILY HISTORY: Family History  Problem Relation Age of Onset  . Arthritis Mother     rheumatoid  . Heart disease Father     CHF  . COPD Sister     Emphysema, h/o cigarettes  . Other Sister     pituatary tumor  . Arthritis Sister   . Obesity Sister   . Cancer Paternal Grandfather     cancer  . Stroke Maternal Aunt     ALLERGIES:  is allergic to erythromycin and other.  MEDICATIONS:  Current Outpatient Prescriptions  Medication Sig Dispense Refill  . aspirin 81 MG tablet Take 81 mg by mouth daily. Has stopped prior to procedure    . Calcium-Magnesium-Vitamin D (CALCIUM MAGNESIUM PO) Take 1 tablet by mouth daily.    . Cholecalciferol (VITAMIN D) 2000 units CAPS Take 2,000 Units by mouth daily.    [name] dexamethasone (DECADRON) 4 MG tablet TAKE 3 TABLETS BY MOUTH TWICE DAILY, TAKE 1 DOSE IN THE MORNING AND 1 DOSE IN THE EVENING THE DAY PRIOR TO CHEMO 6 tablet 0  . lidocaine-prilocaine (EMLA) cream Apply to affected area once 30 g 3  . LORazepam (ATIVAN) 0.5 MG tablet Take 1-2 tablets (0.5-1 mg total) by mouth every 8 (eight) hours. 30 tablet 0  . Omega-3 Fatty Acids (OMEGA-3 PO) Take 2 capsules by mouth daily. Omega 3 1280 mg    . ondansetron (ZOFRAN) 8 MG tablet Take 1 tablet (8 mg total) by mouth 2 (two) times daily as needed for refractory nausea / vomiting. Start on day 3 after chemotherapy 30 tablet 1  . predniSONE (DELTASONE) 20 MG tablet Take 4 tablets (80 mg total) by mouth daily. Take on days 1-5 of chemotherapy. 20 tablet 5  . prochlorperazine (COMPAZINE) 10 MG tablet Take 1 tablet (10 mg total) by mouth every 6 (six) hours as needed (Nausea or vomiting). 30 tablet 6  . Vitamin D, Ergocalciferol, (DRISDOL) 50000 units CAPS capsule TAKE 1 CAPSULE BY MOUTH 1 TIME A WEEK 4 capsule 0  . dexamethasone (DECADRON) 4 MG tablet Take 3 tablets (12 mg  total) by mouth 2 (two) times daily. Take one dose in the morning and one dose in the evening the day prior to chemo. 6 tablet 0   No current facility-administered medications for this visit.    Facility-Administered Medications Ordered in Other Visits  Medication Dose Route Frequency Provider Last Rate Last Dose  . heparin lock flush 100 unit/mL  500 Units Intracatheter Once PRN Brunetta Genera, MD      . sodium chloride flush (NS) 0.9 % injection 10 mL  10 mL Intracatheter PRN Gautam Juleen China, MD  REVIEW OF SYSTEMS:    10 Point review of Systems was done is negative except as noted above.  PHYSICAL EXAMINATION: ECOG PERFORMANCE STATUS: 0 - Asymptomatic  . Vitals:   07/05/16 0834  BP: (!) 152/83  Pulse: 75  Resp: 16  Temp: 99 F (37.2 C)   Filed Weights   07/05/16 0834  Weight: 170 lb 9.6 oz (77.4 kg)   .Body mass index is 25.94 kg/m.  GENERAL:alert, in no acute distress and comfortable SKIN: skin color, texture, turgor are normal, no rashes or significant lesions EYES: normal, conjunctiva are pink and non-injected, sclera clear OROPHARYNX:no exudate, no erythema and lips, buccal mucosa, and tongue normal  NECK: supple, no JVD, thyroid normal size, non-tender, without nodularity LYMPH:  no palpable lymphadenopathy in the cervical, axillary or inguinal LUNGS: clear to auscultation with normal respiratory effort HEART: regular rate & rhythm,  no murmurs and no lower extremity edema ABDOMEN: abdomen soft, non-tender, normoactive bowel sounds  Musculoskeletal: no cyanosis of digits and no clubbing  PSYCH: alert & oriented x 3 with fluent speech NEURO: no focal motor/sensory deficits  LABORATORY DATA:  I have reviewed the data as listed  . CBC Latest Ref Rng & Units 07/05/2016 06/14/2016 05/31/2016  WBC 3.9 - 10.3 10e3/uL 8.8 8.6 1.6(L)  Hemoglobin 11.6 - 15.9 g/dL 11.7 11.8 13.6  Hematocrit 34.8 - 46.6 % 34.0(L) 33.9(L) 40.7  Platelets 145 - 400 10e3/uL  507(H) 426(H) 123(L)   . CBC    Component Value Date/Time   WBC 8.8 07/05/2016 0759   WBC 4.4 05/21/2016 0928   RBC 3.80 07/05/2016 0759   RBC 4.45 05/21/2016 0928   HGB 11.7 07/05/2016 0759   HCT 34.0 (L) 07/05/2016 0759   PLT 507 (H) 07/05/2016 0759   MCV 89.5 07/05/2016 0759   MCH 30.8 07/05/2016 0759   MCH 30.3 05/21/2016 0928   MCHC 34.4 07/05/2016 0759   MCHC 34.3 05/21/2016 0928   RDW 15.9 (H) 07/05/2016 0759   LYMPHSABS 0.3 (L) 07/05/2016 0759   MONOABS 0.7 07/05/2016 0759   EOSABS 0.0 07/05/2016 0759   BASOSABS 0.0 07/05/2016 0759    . CMP Latest Ref Rng & Units 07/05/2016 06/14/2016 05/31/2016  Glucose 70 - 140 mg/dl 136 144(H) 142(H)  BUN 7.0 - 26.0 mg/dL 16.6 15.9 13.7  Creatinine 0.6 - 1.1 mg/dL 0.7 0.7 0.7  Sodium 136 - 145 mEq/L 137 138 137  Potassium 3.5 - 5.1 mEq/L 4.3 4.0 3.8  Chloride 96 - 112 mEq/L - - -  CO2 22 - 29 mEq/L 22 24 25   Calcium 8.4 - 10.4 mg/dL 10.0 10.1 9.8  Total Protein 6.4 - 8.3 g/dL 6.8 6.8 6.9  Total Bilirubin 0.20 - 1.20 mg/dL 0.38 0.44 0.79  Alkaline Phos 40 - 150 U/L 101 94 97  AST 5 - 34 U/L 12 13 10   ALT 0 - 55 U/L 21 19 15    Component     Latest Ref Rng & Units 05/07/2016  Hepatitis B Surface Ag     Negative Negative  Hep B Core Ab, Tot     Negative Negative  Hep C Virus Ab     0.0 - 0.9 s/co ratio <0.1   . Lab Results  Component Value Date   LDH 180 05/24/2016         RADIOGRAPHIC STUDIES:   I have personally reviewed the radiological images as listed and agreed with the findings in the report. Nm Pet Image Initial (pi) Skull Base To Thigh  Result Date: 05/12/2016 CLINICAL DATA:  Initial treatment strategy for recently diagnosed diffuse large B-cell lymphoma. EXAM: NUCLEAR MEDICINE PET SKULL BASE TO THIGH TECHNIQUE: 9.2 mCi F-18 FDG was injected intravenously. Full-ring PET imaging was performed from the skull base to thigh after the radiotracer. CT data was obtained and used for attenuation correction and  anatomic localization. FASTING BLOOD GLUCOSE:  Value: 98 mg/dl COMPARISON:  CT neck 01/02/2016 FINDINGS: NECK There is prominent but symmetric hypermetabolic activity within the lymphoid tissue of Waldeyer's ring. This has an SUV max of 6.4. There are calcifications within the right palatine tonsil.There are several small hypermetabolic cervical lymph nodes, including a 6 mm right level IIb node (image 17, SUV max 5.0) and a 4 mm left level IIa node (image 23, SUV max 5.3). Previously noted left paramedian level 1 node has apparently been removed. There is some residual low level activity in this area, presumably postoperative. CHEST There are small hypermetabolic lymph nodes within the left hilum (SUV max 4.7) and left axilla (SUV max 6.3). These nodes are not pathologically enlarged. No suspicious pulmonary activity. The lungs are clear. There is mild atherosclerosis of the aortic arch. ABDOMEN/PELVIS There is no hypermetabolic activity within the liver, adrenal glands, spleen or pancreas. The spleen is normal in size. There are no hypermetabolic or enlarged intra-abdominal lymph nodes. There is a small left inguinal node which is hypermetabolic (image 619, measuring 7 mm and demonstrating an SUV max of 5.5). SKELETON There is focal hypermetabolic activity within the left aspect of the sternal manubrium, near the sternomanubrial junction. This has an SUV max of 4.1 and correlates with a probable 10 mm sclerotic lesion in this area on CT (image 55). There is also hypermetabolic activity in the left iliac bone (SUV max 5.3). No discrete focal lesion in this area on CT. IMPRESSION: 1. Scattered small mildly hypermetabolic lymph nodes within the neck, left hilum, left axilla and left groin, potentially related the patient's lymphoma. 2. Focal hypermetabolic activity within the left sternal manubrium and left iliac bone, also potentially related to the patient's lymphoma. 3. No evidence of solid visceral organ  involvement. Electronically Signed   By: Richardean Sale M.D.   On: 05/12/2016 16:21   Ir US Guide Vasc Access Right  Result Date: 05/21/2016 CLINICAL DATA:  Diffuse large B-cell lymphoma, needs durable venous access for chemotherapy regimen EXAM: TUNNELED PORT CATHETER PLACEMENT WITH ULTRASOUND AND FLUOROSCOPIC GUIDANCE FLUOROSCOPY TIME:  0.1 minute (15 uGym2 DAP) ANESTHESIA/SEDATION: Intravenous Fentanyl and Versed were administered as conscious sedation during continuous monitoring of the patient's level of consciousness and physiological / cardiorespiratory status by the radiology RN, with a total moderate sedation time of 15 minutes. TECHNIQUE: The procedure, risks, benefits, and alternatives were explained to the patient. Questions regarding the procedure were encouraged and answered. The patient understands and consents to the procedure. As antibiotic prophylaxis, cefazolin 2 g was ordered pre-procedure and administered intravenously within one hour of incision. Patency of the right IJ vein was confirmed with ultrasound with image documentation. An appropriate skin site was determined. Skin site was marked. Region was prepped using maximum barrier technique including cap and mask, sterile gown, sterile gloves, large sterile sheet, and Chlorhexidine as cutaneous antisepsis. The region was infiltrated locally with 1% lidocaine. Under real-time ultrasound guidance, the right IJ vein was accessed with a 21 gauge micropuncture needle; the needle tip within the vein was confirmed with ultrasound image documentation. Needle was exchanged over a 018 guidewire for transitional dilator which allowed  passage of the [facility] wire into the IVC. Over this, the transitional dilator was exchanged for a 5 [place] MPA catheter. A small incision was made on the right anterior chest wall and a subcutaneous pocket fashioned. The power-injectable port was positioned and its catheter tunneled to the right IJ dermatotomy site. The  MPA catheter was exchanged over an Amplatz wire for a peel-away sheath, through which the port catheter, which had been trimmed to the appropriate length, was advanced and positioned under fluoroscopy with its tip at the cavoatrial junction. Spot chest radiograph confirms good catheter position and no pneumothorax. The pocket was closed with deep interrupted and subcuticular continuous 3-0 Monocryl sutures. The port was flushed per protocol. The incisions were covered with Dermabond then covered with a sterile dressing. COMPLICATIONS: COMPLICATIONS None immediate IMPRESSION: Technically successful right IJ power-injectable port catheter placement. Ready for routine use. Electronically Signed   By: Lucrezia Europe M.D.   On: 05/21/2016 12:50    *[place]*                  *[address] [employer].                        [place], [place] [address]                            [phone]  ------------------------------------------------------------------- Transthoracic Echocardiography  Patient:    Caelyn, Route MR #:       [id number] Study Date: 05/14/2016 Gender:     F Age:        80 Height:     174 cm Weight:     79 kg BSA:        1.97 m^2 Pt. Status: Room:   SONOGRAPHER  Tresa Res, RDCS  PERFORMING   [facility], Outpatient  ATTENDING    Roselee Nova     [place], [place]  REFERRING    [place], [place]  cc:  ------------------------------------------------------------------- LV EF: 55% -   60%  ------------------------------------------------------------------- Indications:      Pre-op evaluation V728.1.  ------------------------------------------------------------------- History:   PMH:  Pre-op evaluation for cheomtherapy. No prior cardiac history.  ------------------------------------------------------------------- Study Conclusions  - Left ventricle: The cavity size was normal. Systolic function  was   normal. The estimated ejection fraction was in the range of 55%   to 60%. Wall motion was normal; there were no regional wall   motion abnormalities. Left ventricular diastolic function   parameters were normal. - Left atrium: The atrium was moderately dilated. - Atrial septum: No defect or patent foramen ovale was identified. 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 Focal hypermetabolic activity within the left sternal manubrium and left iliac bone, also potentially related to the patient's lymphoma. No evidence of solid visceral organ involvement.  CT guided bone marrow biopsy done -- no evidence of lymphoma involving Bone marrow. Normal LDH. No constitutional symptoms. Patient has no significant medical comorbidities at baseline.  ECHO with  nl EF as expected.  Patient has completed 2 cycles of R CHOP  2) Rituxan hypersensitivity (mild grade 1 hives on the back - resolved with solumedrol and antihistamines. No issues with dexamethasone pretreatment prior to cycle 2  3) Neutropenia related to chemotherapy PLAN -Patient tolerated second cycle of R CHOP without any prohibitive toxicities. -Labs today are stable and she is good to proceed with her 3rd cycle of R CHOP with Neulasta support -Would do standard Rituxan protocol and not the rapid protocol. -counseled on infection prevention strategies -We will plan to repeat a PET CT scan prior to next (4th cycle of R-CHOP)  RTC with Dr Irene Limbo in 3 weeks C4D1 with labs. Continue chemotherapy per schedule.  . Orders Placed This Encounter  Procedures  . NM PET Image Restag (PS) Skull Base To Thigh    Standing Status:   Future     Standing Expiration Date:   07/05/2017    Order Specific Question:   Reason for Exam (SYMPTOM  OR DIAGNOSIS REQUIRED)    Answer:   re-evaluation of NHL (transformed follicular lymphoma) s/p 3 cycles of R-CHOP chemotherapy.    Order Specific Question:   If indicated for the ordered procedure, I authorize the administration of a radiopharmaceutical per Radiology protocol    Answer:   Yes    Order Specific Question:   Preferred imaging location?    Answer:   [facility]    Order Specific Question:   Radiology Contrast Protocol will attach to exam    Answer:   [email or web address]  . CBC & Diff and Retic    Standing Status:   Future    Standing Expiration Date:   07/05/2017  . Comprehensive metabolic panel    Standing Status:   Future    Standing Expiration Date:   07/05/2017  . Lactate dehydrogenase    Standing Status:   Future    Standing Expiration Date:   07/05/2017    All of the patients questions were answered with apparent satisfaction. The patient knows to call the clinic with any problems, questions or concerns.  I spent 20 minutes counseling the patient face to face. The total time spent in the appointment was 20 minutes and more than 50% was on counseling and direct patient cares.    Sullivan Lone MD [place] AAHIVMS [facility] Hematology/Oncology Physician [facility]  (Office):       [phone] (Work cell):  [phone] (Fax):           [phone]

## 2016-07-05 NOTE — Telephone Encounter (Signed)
Appointments scheduled per 3/5 LOS. Patient will pick up print outs in infusion room. Follow up appointment to be scheduled for early am.

## 2016-07-05 NOTE — Telephone Encounter (Signed)
Added appts per Myrtie Neither for 3/26 for lab and MD at 8:30 am. Normajean Baxter in treatment area to inform patient that there has been appts added to the schedule and will receive new AVS and schedule after treatment.

## 2016-07-07 ENCOUNTER — Ambulatory Visit (HOSPITAL_BASED_OUTPATIENT_CLINIC_OR_DEPARTMENT_OTHER): Payer: Medicare Other

## 2016-07-07 VITALS — BP 110/75 | HR 62 | Temp 98.5°F | Resp 18

## 2016-07-07 DIAGNOSIS — C8338 Diffuse large B-cell lymphoma, lymph nodes of multiple sites: Secondary | ICD-10-CM

## 2016-07-07 DIAGNOSIS — Z5189 Encounter for other specified aftercare: Secondary | ICD-10-CM | POA: Diagnosis not present

## 2016-07-07 MED ORDER — PEGFILGRASTIM INJECTION 6 MG/0.6ML ~~LOC~~
6.0000 mg | PREFILLED_SYRINGE | Freq: Once | SUBCUTANEOUS | Status: AC
  Administered 2016-07-07: 6 mg via SUBCUTANEOUS
  Filled 2016-07-07: qty 0.6

## 2016-07-07 NOTE — Patient Instructions (Signed)
Pegfilgrastim injection What is this medicine? PEGFILGRASTIM (PEG fil gra stim) is a long-acting granulocyte colony-stimulating factor that stimulates the growth of neutrophils, a type of white blood cell important in the body's fight against infection. It is used to reduce the incidence of fever and infection in patients with certain types of cancer who are receiving chemotherapy that affects the bone marrow, and to increase survival after being exposed to high doses of radiation. This medicine may be used for other purposes; ask your health care provider or pharmacist if you have questions. COMMON BRAND NAME(S): Neulasta What should I tell my health care provider before I take this medicine? They need to know if you have any of these conditions: -kidney disease -latex allergy -ongoing radiation therapy -sickle cell disease -skin reactions to acrylic adhesives (On-Body Injector only) -an unusual or allergic reaction to pegfilgrastim, filgrastim, other medicines, foods, dyes, or preservatives -pregnant or trying to get pregnant -breast-feeding How should I use this medicine? This medicine is for injection under the skin. If you get this medicine at home, you will be taught how to prepare and give the pre-filled syringe or how to use the On-body Injector. Refer to the patient Instructions for Use for detailed instructions. Use exactly as directed. Take your medicine at regular intervals. Do not take your medicine more often than directed. It is important that you put your used needles and syringes in a special sharps container. Do not put them in a trash can. If you do not have a sharps container, call your pharmacist or healthcare provider to get one. Talk to your pediatrician regarding the use of this medicine in children. While this drug may be prescribed for selected conditions, precautions do apply. Overdosage: If you think you have taken too much of this medicine contact a poison control  center or emergency room at once. NOTE: This medicine is only for you. Do not share this medicine with others. What if I miss a dose? It is important not to miss your dose. Call your doctor or health care professional if you miss your dose. If you miss a dose due to an On-body Injector failure or leakage, a new dose should be administered as soon as possible using a single prefilled syringe for manual use. What may interact with this medicine? Interactions have not been studied. Give your health care provider a list of all the medicines, herbs, non-prescription drugs, or dietary supplements you use. Also tell them if you smoke, drink alcohol, or use illegal drugs. Some items may interact with your medicine. This list may not describe all possible interactions. Give your health care provider a list of all the medicines, herbs, non-prescription drugs, or dietary supplements you use. Also tell them if you smoke, drink alcohol, or use illegal drugs. Some items may interact with your medicine. What should I watch for while using this medicine? You may need blood work done while you are taking this medicine. If you are going to need a MRI, CT scan, or other procedure, tell your doctor that you are using this medicine (On-Body Injector only). What side effects may I notice from receiving this medicine? Side effects that you should report to your doctor or health care professional as soon as possible: -allergic reactions like skin rash, itching or hives, swelling of the face, lips, or tongue -dizziness -fever -pain, redness, or irritation at site where injected -pinpoint red spots on the skin -red or dark-brown urine -shortness of breath or breathing problems -stomach or   side pain, or pain at the shoulder -swelling -tiredness -trouble passing urine or change in the amount of urine Side effects that usually do not require medical attention (report to your doctor or health care professional if they  continue or are bothersome): -bone pain -muscle pain This list may not describe all possible side effects. Call your doctor for medical advice about side effects. You may report side effects to FDA at 1-800-FDA-1088. Where should I keep my medicine? Keep out of the reach of children. Store pre-filled syringes in a refrigerator between 2 and 8 degrees C (36 and 46 degrees F). Do not freeze. Keep in carton to protect from light. Throw away this medicine if it is left out of the refrigerator for more than 48 hours. Throw away any unused medicine after the expiration date. NOTE: This sheet is a summary. It may not cover all possible information. If you have questions about this medicine, talk to your doctor, pharmacist, or health care provider.  2017 Elsevier/Gold Standard (2014-05-09 14:30:14)  

## 2016-07-14 ENCOUNTER — Encounter: Payer: Self-pay | Admitting: Hematology

## 2016-07-17 ENCOUNTER — Other Ambulatory Visit: Payer: Self-pay | Admitting: Family Medicine

## 2016-07-19 MED FILL — predniSONE 20 MG TABS: 20 | 5 days supply | Qty: 20 | Fill #0

## 2016-07-22 ENCOUNTER — Encounter: Payer: Self-pay | Admitting: Hematology

## 2016-07-23 ENCOUNTER — Ambulatory Visit: Payer: Medicare Other | Admitting: Family Medicine

## 2016-07-23 ENCOUNTER — Encounter (HOSPITAL_COMMUNITY)
Admission: RE | Admit: 2016-07-23 | Discharge: 2016-07-23 | Disposition: A | Discharge status: Home / Self Care | Payer: Medicare Other | Source: Ambulatory Visit | Attending: Hematology | Admitting: Hematology

## 2016-07-23 DIAGNOSIS — C8590 Non-Hodgkin lymphoma, unspecified, unspecified site: Secondary | ICD-10-CM | POA: Diagnosis not present

## 2016-07-23 DIAGNOSIS — C8338 Diffuse large B-cell lymphoma, lymph nodes of multiple sites: Secondary | ICD-10-CM | POA: Diagnosis not present

## 2016-07-23 LAB — GLUCOSE, CAPILLARY: Glucose-Capillary: 98 mg/dL (ref 65–99)

## 2016-07-23 MED ORDER — FLUDEOXYGLUCOSE F - 18 (FDG) INJECTION: 8.6000 | Freq: Once | INTRAVENOUS | Status: DC | PRN

## 2016-07-26 ENCOUNTER — Encounter: Payer: Self-pay | Admitting: Hematology

## 2016-07-26 ENCOUNTER — Ambulatory Visit (HOSPITAL_BASED_OUTPATIENT_CLINIC_OR_DEPARTMENT_OTHER): Payer: Medicare Other | Admitting: Hematology

## 2016-07-26 ENCOUNTER — Other Ambulatory Visit: Payer: Self-pay | Admitting: *Deleted

## 2016-07-26 ENCOUNTER — Other Ambulatory Visit (HOSPITAL_BASED_OUTPATIENT_CLINIC_OR_DEPARTMENT_OTHER): Payer: Medicare Other

## 2016-07-26 ENCOUNTER — Ambulatory Visit (HOSPITAL_BASED_OUTPATIENT_CLINIC_OR_DEPARTMENT_OTHER): Payer: Medicare Other

## 2016-07-26 ENCOUNTER — Telehealth: Payer: Self-pay | Admitting: Hematology

## 2016-07-26 ENCOUNTER — Ambulatory Visit: Payer: Medicare Other

## 2016-07-26 ENCOUNTER — Ambulatory Visit: Payer: Medicare Other | Admitting: Family Medicine

## 2016-07-26 VITALS — BP 154/73 | HR 75 | Temp 98.7°F | Resp 18 | Ht 68.0 in | Wt 166.4 lb

## 2016-07-26 VITALS — BP 101/52 | HR 54 | Temp 98.1°F | Resp 16

## 2016-07-26 DIAGNOSIS — G620 Drug-induced polyneuropathy: Secondary | ICD-10-CM | POA: Diagnosis not present

## 2016-07-26 DIAGNOSIS — C8338 Diffuse large B-cell lymphoma, lymph nodes of multiple sites: Secondary | ICD-10-CM

## 2016-07-26 DIAGNOSIS — Z5112 Encounter for antineoplastic immunotherapy: Secondary | ICD-10-CM | POA: Diagnosis not present

## 2016-07-26 DIAGNOSIS — Z5111 Encounter for antineoplastic chemotherapy: Secondary | ICD-10-CM

## 2016-07-26 LAB — CBC & DIFF AND RETIC
BASO%: 0 % (ref 0.0–2.0)
Basophils Absolute: 0 10*3/uL (ref 0.0–0.1)
EOS%: 0 % (ref 0.0–7.0)
Eosinophils Absolute: 0 10*3/uL (ref 0.0–0.5)
HCT: 32.4 % — ABNORMAL LOW (ref 34.8–46.6)
HGB: 11.2 g/dL — ABNORMAL LOW (ref 11.6–15.9)
Immature Retic Fract: 5.8 % (ref 1.60–10.00)
LYMPH%: 3.3 % — ABNORMAL LOW (ref 14.0–49.7)
MCH: 30.6 pg (ref 25.1–34.0)
MCHC: 34.6 g/dL (ref 31.5–36.0)
MCV: 88.5 fL (ref 79.5–101.0)
MONO#: 0.2 10*3/uL (ref 0.1–0.9)
MONO%: 2.8 % (ref 0.0–14.0)
NEUT#: 7.4 10*3/uL — ABNORMAL HIGH (ref 1.5–6.5)
NEUT%: 93.9 % — ABNORMAL HIGH (ref 38.4–76.8)
Platelets: 416 10*3/uL — ABNORMAL HIGH (ref 145–400)
RBC: 3.66 10*6/uL — ABNORMAL LOW (ref 3.70–5.45)
RDW: 15.4 % — ABNORMAL HIGH (ref 11.2–14.5)
Retic %: 1.91 % (ref 0.70–2.10)
Retic Ct Abs: 69.91 10*3/uL (ref 33.70–90.70)
WBC: 7.8 10*3/uL (ref 3.9–10.3)
lymph#: 0.3 10*3/uL — ABNORMAL LOW (ref 0.9–3.3)

## 2016-07-26 LAB — COMPREHENSIVE METABOLIC PANEL
ALT: 18 U/L (ref 0–55)
AST: 13 U/L (ref 5–34)
Albumin: 3.8 g/dL (ref 3.5–5.0)
Alkaline Phosphatase: 103 U/L (ref 40–150)
Anion Gap: 10 mEq/L (ref 3–11)
BUN: 15.3 mg/dL (ref 7.0–26.0)
CO2: 23 mEq/L (ref 22–29)
Calcium: 10.8 mg/dL — ABNORMAL HIGH (ref 8.4–10.4)
Chloride: 104 mEq/L (ref 98–109)
Creatinine: 0.7 mg/dL (ref 0.6–1.1)
EGFR: 88 mL/min/{1.73_m2} — ABNORMAL LOW (ref >90)
Glucose: 171 mg/dl — ABNORMAL HIGH (ref 70–140)
Potassium: 4.2 mEq/L (ref 3.5–5.1)
Sodium: 136 mEq/L (ref 136–145)
Total Bilirubin: 0.34 mg/dL (ref 0.20–1.20)
Total Protein: 6.8 g/dL (ref 6.4–8.3)

## 2016-07-26 LAB — LACTATE DEHYDROGENASE: LDH: 211 U/L (ref 125–245)

## 2016-07-26 MED ORDER — VINCRISTINE SULFATE CHEMO INJECTION 1 MG/ML
2.0000 mg | Freq: Once | INTRAVENOUS | Status: AC
  Administered 2016-07-26: 2 mg via INTRAVENOUS
  Filled 2016-07-26: qty 2

## 2016-07-26 MED ORDER — HEPARIN SOD (PORK) LOCK FLUSH 100 UNIT/ML IV SOLN
500.0000 [IU] | Freq: Once | INTRAVENOUS | Status: AC | PRN
  Administered 2016-07-26: 500 [IU]
  Filled 2016-07-26: qty 5

## 2016-07-26 MED ORDER — DIPHENHYDRAMINE HCL 50 MG/ML IJ SOLN
INTRAMUSCULAR | Status: AC
  Filled 2016-07-26: qty 1

## 2016-07-26 MED ORDER — FAMOTIDINE IN NACL 20-0.9 MG/50ML-% IV SOLN
INTRAVENOUS | Status: AC
  Filled 2016-07-26: qty 50

## 2016-07-26 MED ORDER — PALONOSETRON HCL INJECTION 0.25 MG/5ML
INTRAVENOUS | Status: AC
  Filled 2016-07-26: qty 5

## 2016-07-26 MED ORDER — PREDNISONE 20 MG PO TABS: 80.0000 mg | ORAL_TABLET | Freq: Every day | ORAL | 5 refills | Status: DC

## 2016-07-26 MED ORDER — DOXORUBICIN HCL CHEMO IV INJECTION 2 MG/ML
50.0000 mg/m2 | Freq: Once | INTRAVENOUS | Status: AC
  Administered 2016-07-26: 98 mg via INTRAVENOUS
  Filled 2016-07-26: qty 49

## 2016-07-26 MED ORDER — PALONOSETRON HCL INJECTION 0.25 MG/5ML
0.2500 mg | Freq: Once | INTRAVENOUS | Status: AC
  Administered 2016-07-26: 0.25 mg via INTRAVENOUS

## 2016-07-26 MED ORDER — DEXAMETHASONE SODIUM PHOSPHATE 100 MG/10ML IJ SOLN
Freq: Once | INTRAMUSCULAR | Status: AC
  Administered 2016-07-26: 10:00:00 via INTRAVENOUS
  Filled 2016-07-26: qty 1.2

## 2016-07-26 MED ORDER — ACETAMINOPHEN 325 MG PO TABS
650.0000 mg | ORAL_TABLET | Freq: Once | ORAL | Status: AC
  Administered 2016-07-26: 650 mg via ORAL

## 2016-07-26 MED ORDER — SODIUM CHLORIDE 0.9% FLUSH
10.0000 mL | INTRAVENOUS | Status: DC | PRN
  Administered 2016-07-26: 10 mL
  Filled 2016-07-26: qty 10

## 2016-07-26 MED ORDER — DEXAMETHASONE 4 MG PO TABS: 12.0000 mg | ORAL_TABLET | Freq: Two times a day (BID) | ORAL | 2 refills | Status: DC

## 2016-07-26 MED ORDER — DIPHENHYDRAMINE HCL 50 MG/ML IJ SOLN
50.0000 mg | Freq: Once | INTRAMUSCULAR | Status: AC
  Administered 2016-07-26: 50 mg via INTRAVENOUS

## 2016-07-26 MED ORDER — SODIUM CHLORIDE 0.9 % IV SOLN
750.0000 mg/m2 | Freq: Once | INTRAVENOUS | Status: AC
  Administered 2016-07-26: 1460 mg via INTRAVENOUS
  Filled 2016-07-26: qty 73

## 2016-07-26 MED ORDER — SODIUM CHLORIDE 0.9 % IV SOLN
Freq: Once | INTRAVENOUS | Status: AC
  Administered 2016-07-26: 10:00:00 via INTRAVENOUS

## 2016-07-26 MED ORDER — ACETAMINOPHEN 325 MG PO TABS
ORAL_TABLET | ORAL | Status: AC
  Filled 2016-07-26: qty 2

## 2016-07-26 MED ORDER — SODIUM CHLORIDE 0.9 % IV SOLN
375.0000 mg/m2 | Freq: Once | INTRAVENOUS | Status: AC
  Administered 2016-07-26: 700 mg via INTRAVENOUS
  Filled 2016-07-26: qty 50

## 2016-07-26 MED ORDER — FAMOTIDINE IN NACL 20-0.9 MG/50ML-% IV SOLN
20.0000 mg | Freq: Once | INTRAVENOUS | Status: AC
  Administered 2016-07-26: 20 mg via INTRAVENOUS

## 2016-07-26 NOTE — Progress Notes (Signed)
Lindsay Kitchen    HEMATOLOGY/ONCOLOGY CLINIC NOTE  Date of Service: .07/26/2016  Patient Care Team: Lindsay Lukes, MD as PCP - General (Family Medicine) Lindsay Harrison M.D. (ENT)  CHIEF COMPLAINTS/PURPOSE OF CONSULTATION:  Diffuse large B-cell lymphoma   HISTORY OF PRESENTING ILLNESS:   Lindsay Harrison is a wonderful 71 y.o. female who has been referred to [place] by Dr Lindsay Belfast MD for evaluation and management of  of newly diagnosed diffuse large B-cell lymphoma.  Patient is an overall very healthy 71 year old lady with a history of iron deficiency anemia in the past due to heavy menses, dyslipidemia, vitamin D deficiency, PCOS and some element of psychosocial stress related to eating the primary caregiver for her husband with GBM.  Patient reports noticing the nodule below her chin first at the end of July 2017. She had this evaluated by her primary care physician and was given a referral to [facility] [place] [facility] who then referred her to Dr. Lovenia Harrison for lymph node biopsy. CT of the maxillo facial region without contrast on 01/02/2016 showed a 9 mm left paramedian level I lymph node.  She had her lymph node biopsy on 04/16/2016 by Dr. 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  She was referred to [place] with these findings. Patient notes that she has not overtly noted any other enlarged lymph nodes. No chest pain or shortness of breath no abdominal pain or distention. No change in bowel habits. No headaches or focal neurological deficits. No overt fevers or chills. No significant drenching night sweats.  INTERVAL HISTORY  Ms Harrison is here for follow-up prior to her 4th cycle of R CHOP chemotherapy. Her PET scan after 3 cycles show stability metabolic response. We discussed and reviewed her PET scan results and she is ready glad with these findings. No acute issues with her last cycle of  chemotherapy. Notes minimal grade 1 neuropathy in her toes. Some constipation for the first 5-7 days addressed by over-the-counter medications. No overt mucositis. No other acute new symptoms. No fevers or chills or night sweats.  MEDICAL HISTORY:  Past Medical History:  Diagnosis Date  . Anemia    h/o iron deficiency secondary to heavy menses  . Arthritis    left hand index finger  . Diffuse large B cell lymphoma ([place])   . Grief reaction 01/27/2016  . H/O measles    as a child  . History of chicken pox    as a child  . History of PCOS   . Hyperlipidemia, mixed 01/27/2016  . Lymphadenopathy   . Preventative health care 01/27/2016  . Vitamin D deficiency   . Wears glasses     SURGICAL HISTORY: Past Surgical History:  Procedure Laterality Date  . COLONOSCOPY    . IR GENERIC HISTORICAL  05/21/2016   IR FLUORO GUIDE PORT INSERTION RIGHT 05/21/2016 Lindsay Cleveland, MD [facility]  . IR GENERIC HISTORICAL  05/21/2016   IR US GUIDE VASC ACCESS RIGHT 05/21/2016 Lindsay Cleveland, MD [facility]  . SKIN BIOPSY Left    face  . THYROGLOSSAL DUCT CYST N/A 04/16/2016   Procedure: THYROGLOSSAL DUCT CYST excision;  Surgeon: Lindsay Belfast, MD;  Location: [facility] OR;  Service: ENT;  Laterality: N/A;    SOCIAL HISTORY: Social History   Social History  . Marital status: Married    Spouse name: N/A  . Number of children: N/A  . Years of education:  N/A   Occupational History  . Psychotherapist    Social History Main Topics  . Smoking status: Never Smoker  . Smokeless tobacco: Never Used  . Alcohol use 1.8 - 3.6 oz/week    3 - 6 Standard drinks or equivalent per week     Comment: 5 glasses of wine a week.  . Drug use: No  . Sexual activity: Yes    Partners: Male    Birth control/ protection: None   Other Topics Concern  . Not on file   Social History Narrative   Married. Education: [employer]. Exercise: walking, yoga, and bicycling daily for 1-2 hours. Lives alone, works as  Management consultant    FAMILY HISTORY: Family History  Problem Relation Age of Onset  . Arthritis Mother     rheumatoid  . Heart disease Father     CHF  . COPD Sister     Emphysema, h/o cigarettes  . Other Sister     pituatary tumor  . Arthritis Sister   . Obesity Sister   . Cancer Paternal Grandfather     cancer  . Stroke Maternal Aunt     ALLERGIES:  is allergic to erythromycin and other.  MEDICATIONS:  Current Outpatient Prescriptions  Medication Sig Dispense Refill  . aspirin 81 MG tablet Take 81 mg by mouth daily. Has stopped prior to procedure    . Calcium-Magnesium-Vitamin D (CALCIUM MAGNESIUM PO) Take 1 tablet by mouth daily.    . Cholecalciferol (VITAMIN D) 2000 units CAPS Take 2,000 Units by mouth daily.    Lindsay Kitchen dexamethasone (DECADRON) 4 MG tablet TAKE 3 TABLETS BY MOUTH TWICE DAILY, TAKE 1 DOSE IN THE MORNING AND 1 DOSE IN THE EVENING THE DAY PRIOR TO CHEMO 6 tablet 0  . lidocaine-prilocaine (EMLA) cream Apply to affected area once 30 g 3  . LORazepam (ATIVAN) 0.5 MG tablet Take 1-2 tablets (0.5-1 mg total) by mouth every 8 (eight) hours. 30 tablet 0  . Omega-3 Fatty Acids (OMEGA-3 PO) Take 2 capsules by mouth daily. Omega 3 1280 mg    . ondansetron (ZOFRAN) 8 MG tablet Take 1 tablet (8 mg total) by mouth 2 (two) times daily as needed for refractory nausea / vomiting. Start on day 3 after chemotherapy 30 tablet 1  . prochlorperazine (COMPAZINE) 10 MG tablet Take 1 tablet (10 mg total) by mouth every 6 (six) hours as needed (Nausea or vomiting). 30 tablet 6  . Vitamin D, Ergocalciferol, (DRISDOL) 50000 units CAPS capsule TAKE 1 CAPSULE BY MOUTH 1 TIME A WEEK 4 capsule 0  . dexamethasone (DECADRON) 4 MG tablet Take 3 tablets (12 mg total) by mouth 2 (two) times daily. Take one dose in the morning and one dose in the evening the day prior to chemo. 6 tablet 2  . predniSONE (DELTASONE) 20 MG tablet Take 4 tablets (80 mg total) by mouth daily. Take on days 1-5 of chemotherapy. 20  tablet 5   No current facility-administered medications for this visit.    Facility-Administered Medications Ordered in Other Visits  Medication Dose Route Frequency Provider Last Rate Last Dose  . fludeoxyglucose F - 18 (FDG) injection 8.6 millicurie  8.6 millicurie Intravenous Once PRN Michiel Cowboy, MD        REVIEW OF SYSTEMS:    10 Point review of Systems was done is negative except as noted above.  PHYSICAL EXAMINATION: ECOG PERFORMANCE STATUS: 0 - Asymptomatic  . Vitals:   07/26/16 0818  BP: (!) 154/73  Pulse: 75  Resp: 18  Temp: 98.7 F (37.1 C)   Filed Weights   07/26/16 0818  Weight: 166 lb 6.4 oz (75.5 kg)   .Body mass index is 25.3 kg/m.  GENERAL:alert, in no acute distress and comfortable SKIN: skin color, texture, turgor are normal, no rashes or significant lesions EYES: normal, conjunctiva are pink and non-injected, sclera clear OROPHARYNX:no exudate, no erythema and lips, buccal mucosa, and tongue normal  NECK: supple, no JVD, thyroid normal size, non-tender, without nodularity LYMPH:  no palpable lymphadenopathy in the cervical, axillary or inguinal LUNGS: clear to auscultation with normal respiratory effort HEART: regular rate & rhythm,  no murmurs and no lower extremity edema ABDOMEN: abdomen soft, non-tender, normoactive bowel sounds  Musculoskeletal: no cyanosis of digits and no clubbing  PSYCH: alert & oriented x 3 with fluent speech NEURO: no focal motor/sensory deficits  LABORATORY DATA:  I have reviewed the data as listed  . CBC Latest Ref Rng & Units 07/26/2016 07/05/2016 06/14/2016  WBC 3.9 - 10.3 10e3/uL 7.8 8.8 8.6  Hemoglobin 11.6 - 15.9 g/dL 11.2(L) 11.7 11.8  Hematocrit 34.8 - 46.6 % 32.4(L) 34.0(L) 33.9(L)  Platelets 145 - 400 10e3/uL 416(H) 507(H) 426(H)   . CBC    Component Value Date/Time   WBC 7.8 07/26/2016 0735   WBC 4.4 05/21/2016 0928   RBC 3.66 (L) 07/26/2016 0735   RBC 4.45 05/21/2016 0928   HGB 11.2 (L) 07/26/2016  0735   HCT 32.4 (L) 07/26/2016 0735   PLT 416 (H) 07/26/2016 0735   MCV 88.5 07/26/2016 0735   MCH 30.6 07/26/2016 0735   MCH 30.3 05/21/2016 0928   MCHC 34.6 07/26/2016 0735   MCHC 34.3 05/21/2016 0928   RDW 15.4 (H) 07/26/2016 0735   LYMPHSABS 0.3 (L) 07/26/2016 0735   MONOABS 0.2 07/26/2016 0735   EOSABS 0.0 07/26/2016 0735   BASOSABS 0.0 07/26/2016 0735    . CMP Latest Ref Rng & Units 07/26/2016 07/05/2016 06/14/2016  Glucose 70 - 140 mg/dl 171(H) 136 144(H)  BUN 7.0 - 26.0 mg/dL 15.3 16.6 15.9  Creatinine 0.6 - 1.1 mg/dL 0.7 0.7 0.7  Sodium 136 - 145 mEq/L 136 137 138  Potassium 3.5 - 5.1 mEq/L 4.2 4.3 4.0  Chloride 96 - 112 mEq/L - - -  CO2 22 - 29 mEq/L 23 22 24   Calcium 8.4 - 10.4 mg/dL 10.8(H) 10.0 10.1  Total Protein 6.4 - 8.3 g/dL 6.8 6.8 6.8  Total Bilirubin 0.20 - 1.20 mg/dL 0.34 0.38 0.44  Alkaline Phos 40 - 150 U/L 103 101 94  AST 5 - 34 U/L 13 12 13   ALT 0 - 55 U/L 18 21 19    Component     Latest Ref Rng & Units 05/07/2016  Hepatitis B Surface Ag     Negative Negative  Hep B Core Ab, Tot     Negative Negative  Hep C Virus Ab     0.0 - 0.9 s/co ratio <0.1   . Lab Results  Component Value Date   LDH 211 07/26/2016         RADIOGRAPHIC STUDIES:   I have personally reviewed the radiological images as listed and agreed with the findings in the report. Nm Pet Image Initial (pi) Skull Base To Thigh  Result Date: 05/12/2016 CLINICAL DATA:  Initial treatment strategy for recently diagnosed diffuse large B-cell lymphoma. EXAM: NUCLEAR MEDICINE PET SKULL BASE TO THIGH TECHNIQUE: 9.2 mCi F-18 FDG was injected intravenously. Full-ring PET imaging was performed from the skull  base to thigh after the radiotracer. CT data was obtained and used for attenuation correction and anatomic localization. FASTING BLOOD GLUCOSE:  Value: 98 mg/dl COMPARISON:  CT neck 01/02/2016 FINDINGS: NECK There is prominent but symmetric hypermetabolic activity within the lymphoid tissue  of Waldeyer's ring. This has an SUV max of 6.4. There are calcifications within the right palatine tonsil.There are several small hypermetabolic cervical lymph nodes, including a 6 mm right level IIb node (image 17, SUV max 5.0) and a 4 mm left level IIa node (image 23, SUV max 5.3). Previously noted left paramedian level 1 node has apparently been removed. There is some residual low level activity in this area, presumably postoperative. CHEST There are small hypermetabolic lymph nodes within the left hilum (SUV max 4.7) and left axilla (SUV max 6.3). These nodes are not pathologically enlarged. No suspicious pulmonary activity. The lungs are clear. There is mild atherosclerosis of the aortic arch. ABDOMEN/PELVIS There is no hypermetabolic activity within the liver, adrenal glands, spleen or pancreas. The spleen is normal in size. There are no hypermetabolic or enlarged intra-abdominal lymph nodes. There is a small left inguinal node which is hypermetabolic (image 888, measuring 7 mm and demonstrating an SUV max of 5.5). SKELETON There is focal hypermetabolic activity within the left aspect of the sternal manubrium, near the sternomanubrial junction. This has an SUV max of 4.1 and correlates with a probable 10 mm sclerotic lesion in this area on CT (image 55). There is also hypermetabolic activity in the left iliac bone (SUV max 5.3). No discrete focal lesion in this area on CT. IMPRESSION: 1. Scattered small mildly hypermetabolic lymph nodes within the neck, left hilum, left axilla and left groin, potentially related the patient's lymphoma. 2. Focal hypermetabolic activity within the left sternal manubrium and left iliac bone, also potentially related to the patient's lymphoma. 3. No evidence of solid visceral organ involvement. Electronically Signed   By: Richardean Sale M.D.   On: 05/12/2016 16:21   Ir US Guide Vasc Access Right  Result Date: 05/21/2016 CLINICAL DATA:  Diffuse large B-cell lymphoma, needs  durable venous access for chemotherapy regimen EXAM: TUNNELED PORT CATHETER PLACEMENT WITH ULTRASOUND AND FLUOROSCOPIC GUIDANCE FLUOROSCOPY TIME:  0.1 minute (15 uGym2 DAP) ANESTHESIA/SEDATION: Intravenous Fentanyl and Versed were administered as conscious sedation during continuous monitoring of the patient's level of consciousness and physiological / cardiorespiratory status by the radiology RN, with a total moderate sedation time of 15 minutes. TECHNIQUE: The procedure, risks, benefits, and alternatives were explained to the patient. Questions regarding the procedure were encouraged and answered. The patient understands and consents to the procedure. As antibiotic prophylaxis, cefazolin 2 g was ordered pre-procedure and administered intravenously within one hour of incision. Patency of the right IJ vein was confirmed with ultrasound with image documentation. An appropriate skin site was determined. Skin site was marked. Region was prepped using maximum barrier technique including cap and mask, sterile gown, sterile gloves, large sterile sheet, and Chlorhexidine as cutaneous antisepsis. The region was infiltrated locally with 1% lidocaine. Under real-time ultrasound guidance, the right IJ vein was accessed with a 21 gauge micropuncture needle; the needle tip within the vein was confirmed with ultrasound image documentation. Needle was exchanged over a 018 guidewire for transitional dilator which allowed passage of the [facility] wire into the IVC. Over this, the transitional dilator was exchanged for a 5 [place] MPA catheter. A small incision was made on the right anterior chest wall and a subcutaneous pocket fashioned. The power-injectable port  was positioned and its catheter tunneled to the right IJ dermatotomy site. The MPA catheter was exchanged over an Amplatz wire for a peel-away sheath, through which the port catheter, which had been trimmed to the appropriate length, was advanced and positioned under  fluoroscopy with its tip at the cavoatrial junction. Spot chest radiograph confirms good catheter position and no pneumothorax. The pocket was closed with deep interrupted and subcuticular continuous 3-0 Monocryl sutures. The port was flushed per protocol. The incisions were covered with Dermabond then covered with a sterile dressing. COMPLICATIONS: COMPLICATIONS None immediate IMPRESSION: Technically successful right IJ power-injectable port catheter placement. Ready for routine use. Electronically Signed   By: Corlis Leak M.D.   On: 05/21/2016 12:50    *[place]*                  *[facility]*                          [address]                        [place], [place] [address]                            [phone]  ------------------------------------------------------------------- Transthoracic Echocardiography  Patient:    Lynzi, Meulemans MR #:       [id number] Study Date: 05/14/2016 Gender:     F Age:        70 Height:     174 cm Weight:     79 kg BSA:        1.97 m^2 Pt. Status: Room:   SONOGRAPHER  Nolon Rod, RDCS  PERFORMING   [facility], Outpatient  ATTENDING    Barron Alvine     [place], [place] Kishore  REFERRING    [place], [place] Kishore  cc:  ------------------------------------------------------------------- LV EF: 55% -   60%  ------------------------------------------------------------------- Indications:      Pre-op evaluation V728.1.  ------------------------------------------------------------------- History:   PMH:  Pre-op evaluation for cheomtherapy. No prior cardiac history.  ------------------------------------------------------------------- Study Conclusions  - Left ventricle: The cavity size was normal. Systolic function was   normal. The estimated ejection fraction was in the range of 55%   to 60%. Wall motion was normal; there were no regional wall   motion abnormalities. Left ventricular diastolic  function   parameters were normal. - Left atrium: The atrium was moderately dilated. - Atrial septum: No defect or patent foramen ovale was identified. - Impressions: Normal GLS -21.3  Nm Pet Image Restag (ps) Skull Base To Thigh  Result Date: 07/23/2016 CLINICAL DATA:  Subsequent treatment strategy for diffuse large B-cell non-Hodgkin's lymphoma. EXAM: NUCLEAR MEDICINE PET SKULL BASE TO THIGH TECHNIQUE: 8 point sick mCi F-18 FDG was injected intravenously. Full-ring PET imaging was performed from the skull base to thigh after the radiotracer. CT data was obtained and used for attenuation correction and anatomic localization. FASTING BLOOD GLUCOSE:  Value: 98 mg/dl COMPARISON:  Multiple exams, including 05/12/2016 FINDINGS: NECK The previously hypermetabolic lymph nodes in the neck have resolved. No enlarged or hypermetabolic lymph nodes currently. CHEST The previous hypermetabolic lymph nodes in the left hilum and left axilla have resolved. No residual hypermetabolic or enlarged lymph nodes. Right Port-A-Cath tip: Cavoatrial junction. Atherosclerotic calcification of the aortic arch. ABDOMEN/PELVIS The previously hypermetabolic left inguinal lymph node measures 5 mm in  short axis (previously 7 mm) and has a maximum standard uptake value of 1.8 (formerly 5.5). Currently no overtly hypermetabolic adenopathy. Hypodense lesion in the lateral segment left hepatic lobe on image 102/4, likely a cyst, no hypermetabolic activity. Aortoiliac atherosclerotic vascular disease. SKELETON Activity at the manubriosternal junction currently has a maximum SUV of 2.6 (previously 4.1), and currently appears similar to background marrow activity. Similarly the left iliac bone activity shown on the prior exam is not currently hypermetabolic, maximum SUV 4.2 compared to previous 5.3. IMPRESSION: 1. Resolution of prior nodal and bony hypermetabolic activity compatible with effective response to therapy. No current significant  hypermetabolic activity is identified. Electronically Signed   By: Van Clines M.D.   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 Focal hypermetabolic activity within the left sternal manubrium and left iliac bone, also potentially related to the patient's lymphoma. No evidence of solid visceral organ involvement.  CT guided bone marrow biopsy done -- no evidence of lymphoma involving Bone marrow. Normal LDH. No constitutional symptoms. Patient has no significant medical comorbidities at baseline.  ECHO with nl EF as expected.  Patient has completed 3 cycles of R CHOP  PET/CT s/p 3 cycles of R-CHOp shows Resolution of prior nodal and bony hypermetabolic activity compatible with effective response to therapy. No current significant hypermetabolic activity is identified.  2) Rituxan hypersensitivity (mild grade 1 hives on the back - resolved with solumedrol and antihistamines. No issues with dexamethasone pretreatment prior to cycle 2  3) Grade 1 neuropathy - will monitor with current ctx. Might need to dose reduce Vincristine if worsening symptoms PLAN -Patient tolerated 3rd cycle of R CHOP without any prohibitive toxicities. 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 After discussion with the patient will plan to continue current ctx and monitor this closely. -Labs today are stable and she is good to proceed with her 4th cycle of R CHOP with Neulasta support -Would do standard Rituxan protocol and not the rapid protocol. -counseled on infection prevention strategies -We will plan to repeat a PET CT after completion of planned 6 cycles of chemotherapy  RTC with Dr  Irene Limbo in 3 weeks C5D1 with labs. Continue chemotherapy per schedule.  . Orders Placed This Encounter  Procedures  . CBC & Diff and Retic    Standing Status:   Future    Standing Expiration Date:   07/26/2017  . Comprehensive metabolic panel    Standing Status:   Future    Standing Expiration Date:   07/26/2017    All of the patients questions were answered with apparent satisfaction. The patient knows to call the clinic with any problems, questions or concerns.  I spent 20 minutes counseling the patient face to face. The total time spent in the appointment was 25 minutes and more than 50% was on counseling and direct patient cares.    Sullivan Lone MD [place] AAHIVMS [facility] Hematology/Oncology Physician [facility]  (Office):       [phone] (Work cell):  [phone] (Fax):           [phone]

## 2016-07-26 NOTE — Patient Instructions (Signed)
[facility] Discharge Instructions for Patients Receiving Chemotherapy  Today you received the following chemotherapy agents:  Doxorubicin, Vincristine, Cytoxan, Rituxan  To help prevent nausea and vomiting after your treatment, we encourage you to take your nausea medication as prescribed.   If you develop nausea and vomiting that is not controlled by your nausea medication, call the clinic.   BELOW ARE SYMPTOMS THAT SHOULD BE REPORTED IMMEDIATELY:  *FEVER GREATER THAN 100.5 F  *CHILLS WITH OR WITHOUT FEVER  NAUSEA AND VOMITING THAT IS NOT CONTROLLED WITH YOUR NAUSEA MEDICATION  *UNUSUAL SHORTNESS OF BREATH  *UNUSUAL BRUISING OR BLEEDING  TENDERNESS IN MOUTH AND THROAT WITH OR WITHOUT PRESENCE OF ULCERS  *URINARY PROBLEMS  *BOWEL PROBLEMS  UNUSUAL RASH Items with * indicate a potential emergency and should be followed up as soon as possible.  Feel free to call the clinic you have any questions or concerns. The clinic phone number is [phone].  Please show the CHEMO ALERT CARD at check-in to the Emergency Department and triage nurse.   

## 2016-07-26 NOTE — Telephone Encounter (Signed)
Gave patient avs report and appointments for March and April.  °

## 2016-07-27 ENCOUNTER — Encounter: Payer: Self-pay | Admitting: *Deleted

## 2016-07-27 NOTE — Progress Notes (Signed)
[place] Work  Holiday representative received referral from Futures trader for emotional support and counseling.  CSW contacted patient at home.  CSW left patient a messaging offering support and encouraging the patient to return call.     Johnnye Lana, MSW, LCSW, OSW-C Clinical Social Worker [facility] [phone]

## 2016-07-28 ENCOUNTER — Ambulatory Visit (HOSPITAL_BASED_OUTPATIENT_CLINIC_OR_DEPARTMENT_OTHER): Payer: Medicare Other

## 2016-07-28 VITALS — BP 120/60 | HR 67 | Temp 98.8°F | Resp 16

## 2016-07-28 DIAGNOSIS — Z5189 Encounter for other specified aftercare: Secondary | ICD-10-CM | POA: Diagnosis not present

## 2016-07-28 DIAGNOSIS — C8338 Diffuse large B-cell lymphoma, lymph nodes of multiple sites: Secondary | ICD-10-CM | POA: Diagnosis present

## 2016-07-28 MED ORDER — PEGFILGRASTIM INJECTION 6 MG/0.6ML ~~LOC~~
6.0000 mg | PREFILLED_SYRINGE | Freq: Once | SUBCUTANEOUS | Status: AC
  Administered 2016-07-28: 6 mg via SUBCUTANEOUS
  Filled 2016-07-28: qty 0.6

## 2016-08-08 ENCOUNTER — Encounter: Payer: Self-pay | Admitting: Hematology

## 2016-08-09 MED FILL — predniSONE 20 MG TABS: 20 | 5 days supply | Qty: 20 | Fill #1

## 2016-08-09 MED FILL — DEXAMETHASONE 4 MG TABLET: 4 | 1 days supply | Qty: 6 | Fill #0

## 2016-08-12 ENCOUNTER — Encounter: Payer: Self-pay | Admitting: *Deleted

## 2016-08-12 NOTE — Progress Notes (Signed)
[place] Work  Holiday representative contacted patient at home to offer support and assess for needs.  Pt provided with counselor options and left with contact to follow up. CSW will continue to follow and assist.   Clinical Social Work interventions: Counseling referral  Loren Racer, LCSW, OSW-C Clinical Social Worker [place]  [place] Phone: [phone] Fax: [phone]

## 2016-08-16 ENCOUNTER — Ambulatory Visit (HOSPITAL_BASED_OUTPATIENT_CLINIC_OR_DEPARTMENT_OTHER): Payer: Medicare Other

## 2016-08-16 ENCOUNTER — Other Ambulatory Visit (HOSPITAL_BASED_OUTPATIENT_CLINIC_OR_DEPARTMENT_OTHER): Payer: Medicare Other

## 2016-08-16 ENCOUNTER — Ambulatory Visit (HOSPITAL_BASED_OUTPATIENT_CLINIC_OR_DEPARTMENT_OTHER): Payer: Medicare Other | Admitting: Hematology

## 2016-08-16 ENCOUNTER — Other Ambulatory Visit: Payer: Self-pay | Admitting: *Deleted

## 2016-08-16 ENCOUNTER — Ambulatory Visit: Payer: Medicare Other

## 2016-08-16 ENCOUNTER — Telehealth: Payer: Self-pay | Admitting: Hematology

## 2016-08-16 ENCOUNTER — Encounter: Payer: Self-pay | Admitting: Hematology

## 2016-08-16 VITALS — BP 127/64 | HR 71 | Temp 98.6°F | Resp 18 | Ht 68.0 in | Wt 165.9 lb

## 2016-08-16 VITALS — BP 129/67 | HR 52 | Temp 98.2°F | Resp 16

## 2016-08-16 DIAGNOSIS — C8338 Diffuse large B-cell lymphoma, lymph nodes of multiple sites: Secondary | ICD-10-CM | POA: Diagnosis not present

## 2016-08-16 DIAGNOSIS — Z5112 Encounter for antineoplastic immunotherapy: Secondary | ICD-10-CM

## 2016-08-16 DIAGNOSIS — Z5111 Encounter for antineoplastic chemotherapy: Secondary | ICD-10-CM | POA: Diagnosis not present

## 2016-08-16 DIAGNOSIS — G620 Drug-induced polyneuropathy: Secondary | ICD-10-CM | POA: Diagnosis not present

## 2016-08-16 DIAGNOSIS — R232 Flushing: Secondary | ICD-10-CM

## 2016-08-16 LAB — CBC & DIFF AND RETIC
BASO%: 0 % (ref 0.0–2.0)
Basophils Absolute: 0 10*3/uL (ref 0.0–0.1)
EOS%: 0 % (ref 0.0–7.0)
Eosinophils Absolute: 0 10*3/uL (ref 0.0–0.5)
HCT: 31.1 % — ABNORMAL LOW (ref 34.8–46.6)
HGB: 10.4 g/dL — ABNORMAL LOW (ref 11.6–15.9)
Immature Retic Fract: 6.3 % (ref 1.60–10.00)
LYMPH%: 8.7 % — ABNORMAL LOW (ref 14.0–49.7)
MCH: 30.9 pg (ref 25.1–34.0)
MCHC: 33.4 g/dL (ref 31.5–36.0)
MCV: 92.3 fL (ref 79.5–101.0)
MONO#: 0.2 10*3/uL (ref 0.1–0.9)
MONO%: 3 % (ref 0.0–14.0)
NEUT#: 6.4 10*3/uL (ref 1.5–6.5)
NEUT%: 88.3 % — ABNORMAL HIGH (ref 38.4–76.8)
Platelets: 366 10*3/uL (ref 145–400)
RBC: 3.37 10*6/uL — ABNORMAL LOW (ref 3.70–5.45)
RDW: 15.7 % — ABNORMAL HIGH (ref 11.2–14.5)
Retic %: 2.46 % — ABNORMAL HIGH (ref 0.70–2.10)
Retic Ct Abs: 82.9 10*3/uL (ref 33.70–90.70)
WBC: 7.3 10*3/uL (ref 3.9–10.3)
lymph#: 0.6 10*3/uL — ABNORMAL LOW (ref 0.9–3.3)

## 2016-08-16 LAB — COMPREHENSIVE METABOLIC PANEL
ALT: 22 U/L (ref 0–55)
AST: 15 U/L (ref 5–34)
Albumin: 3.8 g/dL (ref 3.5–5.0)
Alkaline Phosphatase: 98 U/L (ref 40–150)
Anion Gap: 13 mEq/L — ABNORMAL HIGH (ref 3–11)
BUN: 16.3 mg/dL (ref 7.0–26.0)
CO2: 23 mEq/L (ref 22–29)
Calcium: 10 mg/dL (ref 8.4–10.4)
Chloride: 105 mEq/L (ref 98–109)
Creatinine: 0.7 mg/dL (ref 0.6–1.1)
EGFR: 86 mL/min/{1.73_m2} — ABNORMAL LOW (ref >90)
Glucose: 147 mg/dl — ABNORMAL HIGH (ref 70–140)
Potassium: 4 mEq/L (ref 3.5–5.1)
Sodium: 141 mEq/L (ref 136–145)
Total Bilirubin: 0.41 mg/dL (ref 0.20–1.20)
Total Protein: 6.4 g/dL (ref 6.4–8.3)

## 2016-08-16 MED ORDER — SODIUM CHLORIDE 0.9 % IV SOLN
Freq: Once | INTRAVENOUS | Status: AC
  Administered 2016-08-16: 10:00:00 via INTRAVENOUS

## 2016-08-16 MED ORDER — SODIUM CHLORIDE 0.9 % IV SOLN
375.0000 mg/m2 | Freq: Once | INTRAVENOUS | Status: AC
  Administered 2016-08-16: 700 mg via INTRAVENOUS
  Filled 2016-08-16: qty 50

## 2016-08-16 MED ORDER — SODIUM CHLORIDE 0.9% FLUSH
10.0000 mL | INTRAVENOUS | Status: DC | PRN
  Administered 2016-08-16: 10 mL
  Filled 2016-08-16: qty 10

## 2016-08-16 MED ORDER — DIPHENHYDRAMINE HCL 50 MG/ML IJ SOLN
INTRAMUSCULAR | Status: AC
  Filled 2016-08-16: qty 1

## 2016-08-16 MED ORDER — SODIUM CHLORIDE 0.9 % IV SOLN
Freq: Once | INTRAVENOUS | Status: AC
  Administered 2016-08-16: 10:00:00 via INTRAVENOUS
  Filled 2016-08-16: qty 1.2

## 2016-08-16 MED ORDER — FAMOTIDINE IN NACL 20-0.9 MG/50ML-% IV SOLN
INTRAVENOUS | Status: AC
  Filled 2016-08-16: qty 50

## 2016-08-16 MED ORDER — HEPARIN SOD (PORK) LOCK FLUSH 100 UNIT/ML IV SOLN
500.0000 [IU] | Freq: Once | INTRAVENOUS | Status: AC | PRN
  Administered 2016-08-16: 500 [IU]
  Filled 2016-08-16: qty 5

## 2016-08-16 MED ORDER — SODIUM CHLORIDE 0.9 % IV SOLN
1.0000 mg | Freq: Once | INTRAVENOUS | Status: AC
  Administered 2016-08-16: 1 mg via INTRAVENOUS
  Filled 2016-08-16: qty 1

## 2016-08-16 MED ORDER — ACETAMINOPHEN 325 MG PO TABS
ORAL_TABLET | ORAL | Status: AC
  Filled 2016-08-16: qty 2

## 2016-08-16 MED ORDER — PALONOSETRON HCL INJECTION 0.25 MG/5ML
INTRAVENOUS | Status: AC
  Filled 2016-08-16: qty 5

## 2016-08-16 MED ORDER — SODIUM CHLORIDE 0.9 % IV SOLN
750.0000 mg/m2 | Freq: Once | INTRAVENOUS | Status: AC
  Administered 2016-08-16: 1460 mg via INTRAVENOUS
  Filled 2016-08-16: qty 73

## 2016-08-16 MED ORDER — ACETAMINOPHEN 325 MG PO TABS
650.0000 mg | ORAL_TABLET | Freq: Once | ORAL | Status: AC
  Administered 2016-08-16: 650 mg via ORAL

## 2016-08-16 MED ORDER — SODIUM CHLORIDE 0.9% FLUSH
10.0000 mL | Freq: Once | INTRAVENOUS | Status: AC
  Administered 2016-08-16: 10 mL via INTRAVENOUS
  Filled 2016-08-16: qty 10

## 2016-08-16 MED ORDER — DOXORUBICIN HCL CHEMO IV INJECTION 2 MG/ML
50.0000 mg/m2 | Freq: Once | INTRAVENOUS | Status: AC
  Administered 2016-08-16: 98 mg via INTRAVENOUS
  Filled 2016-08-16: qty 49

## 2016-08-16 MED ORDER — DIPHENHYDRAMINE HCL 50 MG/ML IJ SOLN
50.0000 mg | Freq: Once | INTRAMUSCULAR | Status: AC
  Administered 2016-08-16: 50 mg via INTRAVENOUS

## 2016-08-16 MED ORDER — PALONOSETRON HCL INJECTION 0.25 MG/5ML
0.2500 mg | Freq: Once | INTRAVENOUS | Status: AC
  Administered 2016-08-16: 0.25 mg via INTRAVENOUS

## 2016-08-16 MED ORDER — FAMOTIDINE IN NACL 20-0.9 MG/50ML-% IV SOLN
20.0000 mg | Freq: Once | INTRAVENOUS | Status: AC
  Administered 2016-08-16: 20 mg via INTRAVENOUS

## 2016-08-16 NOTE — Patient Instructions (Signed)
[place] Discharge Instructions for Patients Receiving Chemotherapy  Today you received the following chemotherapy agents: Adriamycin, Vincristine, Cytoxan and Rituxan.  To help prevent nausea and vomiting after your treatment, we encourage you to take your nausea medication: Compazine. Take one every 6 hours as needed. For nausea on or after 4/19, take Zofran.   If you develop nausea and vomiting that is not controlled by your nausea medication, call the clinic.   BELOW ARE SYMPTOMS THAT SHOULD BE REPORTED IMMEDIATELY:  *FEVER GREATER THAN 100.5 F  *CHILLS WITH OR WITHOUT FEVER  NAUSEA AND VOMITING THAT IS NOT CONTROLLED WITH YOUR NAUSEA MEDICATION  *UNUSUAL SHORTNESS OF BREATH  *UNUSUAL BRUISING OR BLEEDING  TENDERNESS IN MOUTH AND THROAT WITH OR WITHOUT PRESENCE OF ULCERS  *URINARY PROBLEMS  *BOWEL PROBLEMS  UNUSUAL RASH Items with * indicate a potential emergency and should be followed up as soon as possible.  Feel free to call the clinic should you have any questions or concerns. The clinic phone number is [phone].  Please show the [place] at check-in to the Emergency Department and triage nurse.

## 2016-08-16 NOTE — Progress Notes (Signed)
[name]    HEMATOLOGY/ONCOLOGY CLINIC NOTE  Date of Service: .08/16/2016  Patient Care Team: [name], MD as PCP - General (Family Medicine) [name] M.D. (ENT)  CHIEF COMPLAINTS/PURPOSE OF CONSULTATION:  f/u Diffuse large B-cell lymphoma   HISTORY OF PRESENTING ILLNESS:   plz see previous note for details of initial presentation.  INTERVAL HISTORY  Lindsay Harrison is here for follow-up prior to her 5th cycle of R CHOP chemotherapy. Notes minimal grade 1-2 neuropathy in her toes/ball of the foot.  There is some persistent neuropathy and some in increase we discussed and discussed and will be dose reducing her vincristine by 50% . We discussed that since her PET/CT scan showed complete metabolic response that there will be an option to treat for 2 cycles after complete metabolic response that is a total of 5 cycles of post 6 cycles of R CHOP no the latter might be more standard treatment. No overt mucositis. No other acute new symptoms. No fevers or chills or night sweats.  No fevers or chills.  MEDICAL HISTORY:  Past Medical History:  Diagnosis Date  . Anemia    h/o iron deficiency secondary to heavy menses  . Arthritis    left hand index finger  . Diffuse large B cell lymphoma ([place])   . Grief reaction 01/27/2016  . H/O measles    as a child  . History of chicken pox    as a child  . History of PCOS   . Hyperlipidemia, mixed 01/27/2016  . Lymphadenopathy   . Preventative health care 01/27/2016  . Vitamin D deficiency   . Wears glasses     SURGICAL HISTORY: Past Surgical History:  Procedure Laterality Date  . COLONOSCOPY    . IR GENERIC HISTORICAL  05/21/2016   IR FLUORO GUIDE PORT INSERTION RIGHT 05/21/2016 Arne Cleveland, MD [facility]  . IR GENERIC HISTORICAL  05/21/2016   IR US GUIDE VASC ACCESS RIGHT 05/21/2016 Arne Cleveland, MD [facility]  . SKIN BIOPSY Left    face  . THYROGLOSSAL DUCT CYST N/A 04/16/2016   Procedure: THYROGLOSSAL DUCT CYST excision;   Surgeon: [name], MD;  Location: [facility] OR;  Service: ENT;  Laterality: N/A;    SOCIAL HISTORY: Social History   Social History  . Marital status: Married    Spouse name: N/A  . Number of children: N/A  . Years of education: N/A   Occupational History  . Psychotherapist    Social History Main Topics  . Smoking status: Never Smoker  . Smokeless tobacco: Never Used  . Alcohol use 1.8 - 3.6 oz/week    3 - 6 Standard drinks or equivalent per week     Comment: 5 glasses of wine a week.  . Drug use: No  . Sexual activity: Yes    Partners: Male    Birth control/ protection: None   Other Topics Concern  . Not on file   Social History Narrative   Married. Education: [employer]. Exercise: walking, yoga, and bicycling daily for 1-2 hours. Lives alone, works as Management consultant    FAMILY HISTORY: Family History  Problem Relation Age of Onset  . Arthritis Mother     rheumatoid  . Heart disease Father     CHF  . COPD Sister     Emphysema, h/o cigarettes  . Other Sister     pituatary tumor  . Arthritis Sister   . Obesity Sister   . Cancer Paternal Grandfather     cancer  .  Stroke Maternal Aunt     ALLERGIES:  is allergic to erythromycin and other.  MEDICATIONS:  Current Outpatient Prescriptions  Medication Sig Dispense Refill  . aspirin 81 MG tablet Take 81 mg by mouth daily. Has stopped prior to procedure    . Calcium-Magnesium-Vitamin D (CALCIUM MAGNESIUM PO) Take 1 tablet by mouth daily.    . Cholecalciferol (VITAMIN D) 2000 units CAPS Take 2,000 Units by mouth daily.    [name] dexamethasone (DECADRON) 4 MG tablet TAKE 3 TABLETS BY MOUTH TWICE DAILY, TAKE 1 DOSE IN THE MORNING AND 1 DOSE IN THE EVENING THE DAY PRIOR TO CHEMO 6 tablet 0  . dexamethasone (DECADRON) 4 MG tablet Take 3 tablets (12 mg total) by mouth 2 (two) times daily. Take one dose in the morning and one dose in the evening the day prior to chemo. 6 tablet 2  . lidocaine-prilocaine (EMLA) cream Apply to  affected area once 30 g 3  . LORazepam (ATIVAN) 0.5 MG tablet Take 1-2 tablets (0.5-1 mg total) by mouth every 8 (eight) hours. 30 tablet 0  . Omega-3 Fatty Acids (OMEGA-3 PO) Take 2 capsules by mouth daily. Omega 3 1280 mg    . ondansetron (ZOFRAN) 8 MG tablet Take 1 tablet (8 mg total) by mouth 2 (two) times daily as needed for refractory nausea / vomiting. Start on day 3 after chemotherapy 30 tablet 1  . predniSONE (DELTASONE) 20 MG tablet Take 4 tablets (80 mg total) by mouth daily. Take on days 1-5 of chemotherapy. 20 tablet 5  . prochlorperazine (COMPAZINE) 10 MG tablet Take 1 tablet (10 mg total) by mouth every 6 (six) hours as needed (Nausea or vomiting). 30 tablet 6  . Vitamin D, Ergocalciferol, (DRISDOL) 50000 units CAPS capsule TAKE 1 CAPSULE BY MOUTH 1 TIME A WEEK 4 capsule 0   No current facility-administered medications for this visit.     REVIEW OF SYSTEMS:    10 Point review of Systems was done is negative except as noted above.  PHYSICAL EXAMINATION: ECOG PERFORMANCE STATUS: 0 - Asymptomatic  . Vitals:   08/16/16 0834  BP: 127/64  Pulse: 71  Resp: 18  Temp: 98.6 F (37 C)   Filed Weights   08/16/16 0834  Weight: 165 lb 14.4 oz (75.3 kg)   .Body mass index is 25.23 kg/m.  GENERAL:alert, in no acute distress and comfortable SKIN: skin color, texture, turgor are normal, no rashes or significant lesions EYES: normal, conjunctiva are pink and non-injected, sclera clear OROPHARYNX:no exudate, no erythema and lips, buccal mucosa, and tongue normal  NECK: supple, no JVD, thyroid normal size, non-tender, without nodularity LYMPH:  no palpable lymphadenopathy in the cervical, axillary or inguinal LUNGS: clear to auscultation with normal respiratory effort HEART: regular rate & rhythm,  no murmurs and no lower extremity edema ABDOMEN: abdomen soft, non-tender, normoactive bowel sounds  Musculoskeletal: no cyanosis of digits and no clubbing  PSYCH: alert & oriented x  3 with fluent speech NEURO: no focal motor/sensory deficits  LABORATORY DATA:  I have reviewed the data as listed  . CBC Latest Ref Rng & Units 08/16/2016 07/26/2016 07/05/2016  WBC 3.9 - 10.3 10e3/uL 7.3 7.8 8.8  Hemoglobin 11.6 - 15.9 g/dL 10.4(L) 11.2(L) 11.7  Hematocrit 34.8 - 46.6 % 31.1(L) 32.4(L) 34.0(L)  Platelets 145 - 400 10e3/uL 366 416(H) 507(H)   . CBC    Component Value Date/Time   WBC 7.3 08/16/2016 0749   WBC 4.4 05/21/2016 0928   RBC 3.37 (L)  08/16/2016 0749   RBC 4.45 05/21/2016 0928   HGB 10.4 (L) 08/16/2016 0749   HCT 31.1 (L) 08/16/2016 0749   PLT 366 08/16/2016 0749   MCV 92.3 08/16/2016 0749   MCH 30.9 08/16/2016 0749   MCH 30.3 05/21/2016 0928   MCHC 33.4 08/16/2016 0749   MCHC 34.3 05/21/2016 0928   RDW 15.7 (H) 08/16/2016 0749   LYMPHSABS 0.6 (L) 08/16/2016 0749   MONOABS 0.2 08/16/2016 0749   EOSABS 0.0 08/16/2016 0749   BASOSABS 0.0 08/16/2016 0749    . CMP Latest Ref Rng & Units 08/16/2016 07/26/2016 07/05/2016  Glucose 70 - 140 mg/dl 147(H) 171(H) 136  BUN 7.0 - 26.0 mg/dL 16.3 15.3 16.6  Creatinine 0.6 - 1.1 mg/dL 0.7 0.7 0.7  Sodium 136 - 145 mEq/L 141 136 137  Potassium 3.5 - 5.1 mEq/L 4.0 4.2 4.3  Chloride 96 - 112 mEq/L - - -  CO2 22 - 29 mEq/L 23 23 22   Calcium 8.4 - 10.4 mg/dL 10.0 10.8(H) 10.0  Total Protein 6.4 - 8.3 g/dL 6.4 6.8 6.8  Total Bilirubin 0.20 - 1.20 mg/dL 0.41 0.34 0.38  Alkaline Phos 40 - 150 U/L 98 103 101  AST 5 - 34 U/L 15 13 12   ALT 0 - 55 U/L 22 18 21    Component     Latest Ref Rng & Units 05/07/2016  Hepatitis B Surface Ag     Negative Negative  Hep B Core Ab, Tot     Negative Negative  Hep C Virus Ab     0.0 - 0.9 s/co ratio <0.1   . Lab Results  Component Value Date   LDH 211 07/26/2016         RADIOGRAPHIC STUDIES:   I have personally reviewed the radiological images as listed and agreed with the findings in the report. Nm Pet Image Initial (pi) Skull Base To Thigh  Result Date:  05/12/2016 CLINICAL DATA:  Initial treatment strategy for recently diagnosed diffuse large B-cell lymphoma. EXAM: NUCLEAR MEDICINE PET SKULL BASE TO THIGH TECHNIQUE: 9.2 mCi F-18 FDG was injected intravenously. Full-ring PET imaging was performed from the skull base to thigh after the radiotracer. CT data was obtained and used for attenuation correction and anatomic localization. FASTING BLOOD GLUCOSE:  Value: 98 mg/dl COMPARISON:  CT neck 01/02/2016 FINDINGS: NECK There is prominent but symmetric hypermetabolic activity within the lymphoid tissue of Waldeyer's ring. This has an SUV max of 6.4. There are calcifications within the right palatine tonsil.There are several small hypermetabolic cervical lymph nodes, including a 6 mm right level IIb node (image 17, SUV max 5.0) and a 4 mm left level IIa node (image 23, SUV max 5.3). Previously noted left paramedian level 1 node has apparently been removed. There is some residual low level activity in this area, presumably postoperative. CHEST There are small hypermetabolic lymph nodes within the left hilum (SUV max 4.7) and left axilla (SUV max 6.3). These nodes are not pathologically enlarged. No suspicious pulmonary activity. The lungs are clear. There is mild atherosclerosis of the aortic arch. ABDOMEN/PELVIS There is no hypermetabolic activity within the liver, adrenal glands, spleen or pancreas. The spleen is normal in size. There are no hypermetabolic or enlarged intra-abdominal lymph nodes. There is a small left inguinal node which is hypermetabolic (image 196, measuring 7 mm and demonstrating an SUV max of 5.5). SKELETON There is focal hypermetabolic activity within the left aspect of the sternal manubrium, near the sternomanubrial junction. This has an SUV  max of 4.1 and correlates with a probable 10 mm sclerotic lesion in this area on CT (image 55). There is also hypermetabolic activity in the left iliac bone (SUV max 5.3). No discrete focal lesion in this area  on CT. IMPRESSION: 1. Scattered small mildly hypermetabolic lymph nodes within the neck, left hilum, left axilla and left groin, potentially related the patient's lymphoma. 2. Focal hypermetabolic activity within the left sternal manubrium and left iliac bone, also potentially related to the patient's lymphoma. 3. No evidence of solid visceral organ involvement. Electronically Signed   By: Richardean Sale M.D.   On: 05/12/2016 16:21   Ir US Guide Vasc Access Right  Result Date: 05/21/2016 CLINICAL DATA:  Diffuse large B-cell lymphoma, needs durable venous access for chemotherapy regimen EXAM: TUNNELED PORT CATHETER PLACEMENT WITH ULTRASOUND AND FLUOROSCOPIC GUIDANCE FLUOROSCOPY TIME:  0.1 minute (15 uGym2 DAP) ANESTHESIA/SEDATION: Intravenous Fentanyl and Versed were administered as conscious sedation during continuous monitoring of the patient's level of consciousness and physiological / cardiorespiratory status by the radiology RN, with a total moderate sedation time of 15 minutes. TECHNIQUE: The procedure, risks, benefits, and alternatives were explained to the patient. Questions regarding the procedure were encouraged and answered. The patient understands and consents to the procedure. As antibiotic prophylaxis, cefazolin 2 g was ordered pre-procedure and administered intravenously within one hour of incision. Patency of the right IJ vein was confirmed with ultrasound with image documentation. An appropriate skin site was determined. Skin site was marked. Region was prepped using maximum barrier technique including cap and mask, sterile gown, sterile gloves, large sterile sheet, and Chlorhexidine as cutaneous antisepsis. The region was infiltrated locally with 1% lidocaine. Under real-time ultrasound guidance, the right IJ vein was accessed with a 21 gauge micropuncture needle; the needle tip within the vein was confirmed with ultrasound image documentation. Needle was exchanged over a 018 guidewire for  transitional dilator which allowed passage of the [facility] wire into the IVC. Over this, the transitional dilator was exchanged for a 5 [place] MPA catheter. A small incision was made on the right anterior chest wall and a subcutaneous pocket fashioned. The power-injectable port was positioned and its catheter tunneled to the right IJ dermatotomy site. The MPA catheter was exchanged over an Amplatz wire for a peel-away sheath, through which the port catheter, which had been trimmed to the appropriate length, was advanced and positioned under fluoroscopy with its tip at the cavoatrial junction. Spot chest radiograph confirms good catheter position and no pneumothorax. The pocket was closed with deep interrupted and subcuticular continuous 3-0 Monocryl sutures. The port was flushed per protocol. The incisions were covered with Dermabond then covered with a sterile dressing. COMPLICATIONS: COMPLICATIONS None immediate IMPRESSION: Technically successful right IJ power-injectable port catheter placement. Ready for routine use. Electronically Signed   By: Lucrezia Europe M.D.   On: 05/21/2016 12:50    **                  *[facility] [employer].                        [place], [place] [address]                            [phone]  -------------------------------------------------------------------  Transthoracic Echocardiography  Patient:    Stephania, Macfarlane MR #:       [id number] Study Date: 05/14/2016 Gender:     F Age:        71 Height:     174 cm Weight:     79 kg BSA:        1.97 m^2 Pt. Status: Room:   SONOGRAPHER  Tresa Res, RDCS  PERFORMING   [facility], Outpatient  ATTENDING    Roselee Nova     [place], [place]  REFERRING    [place], [place]  cc:  ------------------------------------------------------------------- LV EF: 55% -    60%  ------------------------------------------------------------------- Indications:      Pre-op evaluation V728.1.  ------------------------------------------------------------------- History:   PMH:  Pre-op evaluation for cheomtherapy. No prior cardiac history.  ------------------------------------------------------------------- Study Conclusions  - Left ventricle: The cavity size was normal. Systolic function was   normal. The estimated ejection fraction was in the range of 55%   to 60%. Wall motion was normal; there were no regional wall   motion abnormalities. Left ventricular diastolic function   parameters were normal. - Left atrium: The atrium was moderately dilated. - Atrial septum: No defect or patent foramen ovale was identified. - Impressions: Normal GLS -21.3  Nm Pet Image Restag (ps) Skull Base To Thigh  Result Date: 07/23/2016 CLINICAL DATA:  Subsequent treatment strategy for diffuse large B-cell non-Hodgkin's lymphoma. EXAM: NUCLEAR MEDICINE PET SKULL BASE TO THIGH TECHNIQUE: 8 point sick mCi F-18 FDG was injected intravenously. Full-ring PET imaging was performed from the skull base to thigh after the radiotracer. CT data was obtained and used for attenuation correction and anatomic localization. FASTING BLOOD GLUCOSE:  Value: 98 mg/dl COMPARISON:  Multiple exams, including 05/12/2016 FINDINGS: NECK The previously hypermetabolic lymph nodes in the neck have resolved. No enlarged or hypermetabolic lymph nodes currently. CHEST The previous hypermetabolic lymph nodes in the left hilum and left axilla have resolved. No residual hypermetabolic or enlarged lymph nodes. Right Port-A-Cath tip: Cavoatrial junction. Atherosclerotic calcification of the aortic arch. ABDOMEN/PELVIS The previously hypermetabolic left inguinal lymph node measures 5 mm in short axis (previously 7 mm) and has a maximum standard uptake value of 1.8 (formerly 5.5). Currently no overtly hypermetabolic  adenopathy. Hypodense lesion in the lateral segment left hepatic lobe on image 102/4, likely a cyst, no hypermetabolic activity. Aortoiliac atherosclerotic vascular disease. SKELETON Activity at the manubriosternal junction currently has a maximum SUV of 2.6 (previously 4.1), and currently appears similar to background marrow activity. Similarly the left iliac bone activity shown on the prior exam is not currently hypermetabolic, maximum SUV 4.2 compared to previous 5.3. IMPRESSION: 1. Resolution of prior nodal and bony hypermetabolic activity compatible with effective response to therapy. No current significant hypermetabolic activity is identified. Electronically Signed   By: Van Clines M.D.   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 Focal hypermetabolic activity within the left sternal manubrium and left iliac bone, also potentially related to the patient's lymphoma. No evidence of solid visceral organ involvement.  CT guided bone marrow biopsy done -- no evidence of lymphoma involving Bone marrow. Normal LDH. No constitutional symptoms. Patient has no significant medical comorbidities at baseline.  ECHO with  nl EF as expected.  Patient has completed 4 cycles of R CHOP  PET/CT s/p 3 cycles of R-CHOp shows Resolution of prior nodal and bony hypermetabolic activity compatible with effective response to therapy. No current significant hypermetabolic activity is identified.  2) Rituxan hypersensitivity (mild grade 1 hives on the back - resolved with solumedrol and antihistamines. 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 -Patient appropriate to proceed with fifth cycle of R CHOP today. -We'll dose reduce vincristine 50% for a grade 1-2 neuropathy. -Would do standard Rituxan protocol and not the rapid protocol. -counseled on infection prevention strategies. -Discussed pros and cons of doing total of 5 cycles versus 6 cycles of treatment. We will plan to do 6 cycles unless the patient tells [place] otherwise. She is when to think about this. -We will plan to repeat a PET CT after completion of planned 6 cycles of chemotherapy We'll plan to do maintenance Rituxan after completion of planned R CHOP treatment  RTC with Dr Irene Limbo in 3 weeks C6D1 with labs. Continue chemotherapy per schedule.  . No orders of the defined types were placed in this encounter.   All of the patients questions were answered with apparent satisfaction. The patient knows to call the clinic with any problems, questions or concerns.  I spent 20 minutes counseling the patient face to face. The total time spent in the appointment was 25 minutes and more than 50% was on counseling and direct patient cares.    Sullivan Lone MD [place] AAHIVMS [facility] Hematology/Oncology Physician [facility]  (Office):       [phone] (Work cell):  [phone] (Fax):           [phone]

## 2016-08-16 NOTE — Telephone Encounter (Signed)
Scheduled appts per 4/16 los. Patient paged before schedule was finished and to receive calender and avs in the treatment area.Patient aware.

## 2016-08-18 ENCOUNTER — Ambulatory Visit (HOSPITAL_BASED_OUTPATIENT_CLINIC_OR_DEPARTMENT_OTHER): Payer: Medicare Other

## 2016-08-18 VITALS — BP 142/60 | HR 64 | Temp 98.6°F | Resp 18

## 2016-08-18 DIAGNOSIS — C8338 Diffuse large B-cell lymphoma, lymph nodes of multiple sites: Secondary | ICD-10-CM | POA: Diagnosis present

## 2016-08-18 DIAGNOSIS — Z5189 Encounter for other specified aftercare: Secondary | ICD-10-CM | POA: Diagnosis not present

## 2016-08-18 MED ORDER — PEGFILGRASTIM INJECTION 6 MG/0.6ML ~~LOC~~
6.0000 mg | PREFILLED_SYRINGE | Freq: Once | SUBCUTANEOUS | Status: AC
  Administered 2016-08-18: 6 mg via SUBCUTANEOUS
  Filled 2016-08-18: qty 0.6

## 2016-08-18 NOTE — Patient Instructions (Signed)
Pegfilgrastim injection What is this medicine? PEGFILGRASTIM (PEG fil gra stim) is a long-acting granulocyte colony-stimulating factor that stimulates the growth of neutrophils, a type of white blood cell important in the body's fight against infection. It is used to reduce the incidence of fever and infection in patients with certain types of cancer who are receiving chemotherapy that affects the bone marrow, and to increase survival after being exposed to high doses of radiation. This medicine may be used for other purposes; ask your health care provider or pharmacist if you have questions. COMMON BRAND NAME(S): Neulasta What should I tell my health care provider before I take this medicine? They need to know if you have any of these conditions: -kidney disease -latex allergy -ongoing radiation therapy -sickle cell disease -skin reactions to acrylic adhesives (On-Body Injector only) -an unusual or allergic reaction to pegfilgrastim, filgrastim, other medicines, foods, dyes, or preservatives -pregnant or trying to get pregnant -breast-feeding How should I use this medicine? This medicine is for injection under the skin. If you get this medicine at home, you will be taught how to prepare and give the pre-filled syringe or how to use the On-body Injector. Refer to the patient Instructions for Use for detailed instructions. Use exactly as directed. Take your medicine at regular intervals. Do not take your medicine more often than directed. It is important that you put your used needles and syringes in a special sharps container. Do not put them in a trash can. If you do not have a sharps container, call your pharmacist or healthcare provider to get one. Talk to your pediatrician regarding the use of this medicine in children. While this drug may be prescribed for selected conditions, precautions do apply. Overdosage: If you think you have taken too much of this medicine contact a poison control  center or emergency room at once. NOTE: This medicine is only for you. Do not share this medicine with others. What if I miss a dose? It is important not to miss your dose. Call your doctor or health care professional if you miss your dose. If you miss a dose due to an On-body Injector failure or leakage, a new dose should be administered as soon as possible using a single prefilled syringe for manual use. What may interact with this medicine? Interactions have not been studied. Give your health care provider a list of all the medicines, herbs, non-prescription drugs, or dietary supplements you use. Also tell them if you smoke, drink alcohol, or use illegal drugs. Some items may interact with your medicine. This list may not describe all possible interactions. Give your health care provider a list of all the medicines, herbs, non-prescription drugs, or dietary supplements you use. Also tell them if you smoke, drink alcohol, or use illegal drugs. Some items may interact with your medicine. What should I watch for while using this medicine? You may need blood work done while you are taking this medicine. If you are going to need a MRI, CT scan, or other procedure, tell your doctor that you are using this medicine (On-Body Injector only). What side effects may I notice from receiving this medicine? Side effects that you should report to your doctor or health care professional as soon as possible: -allergic reactions like skin rash, itching or hives, swelling of the face, lips, or tongue -dizziness -fever -pain, redness, or irritation at site where injected -pinpoint red spots on the skin -red or dark-brown urine -shortness of breath or breathing problems -stomach or   side pain, or pain at the shoulder -swelling -tiredness -trouble passing urine or change in the amount of urine Side effects that usually do not require medical attention (report to your doctor or health care professional if they  continue or are bothersome): -bone pain -muscle pain This list may not describe all possible side effects. Call your doctor for medical advice about side effects. You may report side effects to FDA at 1-800-FDA-1088. Where should I keep my medicine? Keep out of the reach of children. Store pre-filled syringes in a refrigerator between 2 and 8 degrees C (36 and 46 degrees F). Do not freeze. Keep in carton to protect from light. Throw away this medicine if it is left out of the refrigerator for more than 48 hours. Throw away any unused medicine after the expiration date. NOTE: This sheet is a summary. It may not cover all possible information. If you have questions about this medicine, talk to your doctor, pharmacist, or health care provider.  2017 Elsevier/Gold Standard (2014-05-09 14:30:14)  

## 2016-08-30 MED FILL — DEXAMETHASONE 4 MG TABLET: 4 | 1 days supply | Qty: 6 | Fill #1

## 2016-08-30 MED FILL — predniSONE 20 MG TABS: 20 | 5 days supply | Qty: 20 | Fill #2

## 2016-08-31 ENCOUNTER — Ambulatory Visit: Payer: Medicare Other | Admitting: Family Medicine

## 2016-09-06 ENCOUNTER — Ambulatory Visit (HOSPITAL_BASED_OUTPATIENT_CLINIC_OR_DEPARTMENT_OTHER): Payer: Medicare Other | Admitting: Hematology

## 2016-09-06 ENCOUNTER — Ambulatory Visit (HOSPITAL_BASED_OUTPATIENT_CLINIC_OR_DEPARTMENT_OTHER): Payer: Medicare Other

## 2016-09-06 ENCOUNTER — Encounter: Payer: Self-pay | Admitting: Hematology

## 2016-09-06 ENCOUNTER — Ambulatory Visit: Payer: Medicare Other

## 2016-09-06 ENCOUNTER — Other Ambulatory Visit (HOSPITAL_BASED_OUTPATIENT_CLINIC_OR_DEPARTMENT_OTHER): Payer: Medicare Other

## 2016-09-06 ENCOUNTER — Telehealth: Payer: Self-pay | Admitting: Hematology

## 2016-09-06 VITALS — BP 142/68 | HR 69 | Temp 98.5°F | Resp 18 | Ht 68.0 in | Wt 165.9 lb

## 2016-09-06 VITALS — BP 111/55 | HR 59 | Temp 98.3°F | Resp 18

## 2016-09-06 DIAGNOSIS — G620 Drug-induced polyneuropathy: Secondary | ICD-10-CM

## 2016-09-06 DIAGNOSIS — Z5112 Encounter for antineoplastic immunotherapy: Secondary | ICD-10-CM | POA: Diagnosis present

## 2016-09-06 DIAGNOSIS — C8338 Diffuse large B-cell lymphoma, lymph nodes of multiple sites: Secondary | ICD-10-CM | POA: Diagnosis not present

## 2016-09-06 DIAGNOSIS — Z5111 Encounter for antineoplastic chemotherapy: Secondary | ICD-10-CM

## 2016-09-06 DIAGNOSIS — Z95828 Presence of other vascular implants and grafts: Secondary | ICD-10-CM

## 2016-09-06 LAB — COMPREHENSIVE METABOLIC PANEL
ALT: 26 U/L (ref 0–55)
AST: 15 U/L (ref 5–34)
Albumin: 3.9 g/dL (ref 3.5–5.0)
Alkaline Phosphatase: 87 U/L (ref 40–150)
Anion Gap: 9 mEq/L (ref 3–11)
BUN: 17 mg/dL (ref 7.0–26.0)
CO2: 24 mEq/L (ref 22–29)
Calcium: 9.9 mg/dL (ref 8.4–10.4)
Chloride: 105 mEq/L (ref 98–109)
Creatinine: 0.7 mg/dL (ref 0.6–1.1)
EGFR: 88 mL/min/{1.73_m2} — ABNORMAL LOW (ref >90)
Glucose: 131 mg/dl (ref 70–140)
Potassium: 4.1 mEq/L (ref 3.5–5.1)
Sodium: 139 mEq/L (ref 136–145)
Total Bilirubin: 0.39 mg/dL (ref 0.20–1.20)
Total Protein: 6.4 g/dL (ref 6.4–8.3)

## 2016-09-06 LAB — CBC WITH DIFFERENTIAL/PLATELET
BASO%: 0.1 % (ref 0.0–2.0)
Basophils Absolute: 0 10*3/uL (ref 0.0–0.1)
EOS%: 0 % (ref 0.0–7.0)
Eosinophils Absolute: 0 10*3/uL (ref 0.0–0.5)
HCT: 32.5 % — ABNORMAL LOW (ref 34.8–46.6)
HGB: 11.2 g/dL — ABNORMAL LOW (ref 11.6–15.9)
LYMPH%: 2.7 % — ABNORMAL LOW (ref 14.0–49.7)
MCH: 32.7 pg (ref 25.1–34.0)
MCHC: 34.6 g/dL (ref 31.5–36.0)
MCV: 94.6 fL (ref 79.5–101.0)
MONO#: 0.5 10*3/uL (ref 0.1–0.9)
MONO%: 7.5 % (ref 0.0–14.0)
NEUT#: 5.7 10*3/uL (ref 1.5–6.5)
NEUT%: 89.7 % — ABNORMAL HIGH (ref 38.4–76.8)
Platelets: 370 10*3/uL (ref 145–400)
RBC: 3.44 10*6/uL — ABNORMAL LOW (ref 3.70–5.45)
RDW: 16.7 % — ABNORMAL HIGH (ref 11.2–14.5)
WBC: 6.4 10*3/uL (ref 3.9–10.3)
lymph#: 0.2 10*3/uL — ABNORMAL LOW (ref 0.9–3.3)

## 2016-09-06 MED ORDER — DIPHENHYDRAMINE HCL 50 MG/ML IJ SOLN
50.0000 mg | Freq: Once | INTRAMUSCULAR | Status: AC
  Administered 2016-09-06: 50 mg via INTRAVENOUS

## 2016-09-06 MED ORDER — SODIUM CHLORIDE 0.9% FLUSH
10.0000 mL | INTRAVENOUS | Status: DC | PRN
  Administered 2016-09-06: 10 mL
  Filled 2016-09-06: qty 10

## 2016-09-06 MED ORDER — FAMOTIDINE IN NACL 20-0.9 MG/50ML-% IV SOLN
20.0000 mg | Freq: Once | INTRAVENOUS | Status: AC
  Administered 2016-09-06: 20 mg via INTRAVENOUS

## 2016-09-06 MED ORDER — SODIUM CHLORIDE 0.9 % IV SOLN
Freq: Once | INTRAVENOUS | Status: AC
  Administered 2016-09-06: 10:00:00 via INTRAVENOUS

## 2016-09-06 MED ORDER — SODIUM CHLORIDE 0.9% FLUSH
10.0000 mL | INTRAVENOUS | Status: DC | PRN
  Administered 2016-09-06: 10 mL via INTRAVENOUS
  Filled 2016-09-06: qty 10

## 2016-09-06 MED ORDER — FAMOTIDINE IN NACL 20-0.9 MG/50ML-% IV SOLN
INTRAVENOUS | Status: AC
  Filled 2016-09-06: qty 50

## 2016-09-06 MED ORDER — ACETAMINOPHEN 325 MG PO TABS
ORAL_TABLET | ORAL | Status: AC
  Filled 2016-09-06: qty 2

## 2016-09-06 MED ORDER — HEPARIN SOD (PORK) LOCK FLUSH 100 UNIT/ML IV SOLN
500.0000 [IU] | Freq: Once | INTRAVENOUS | Status: AC | PRN
  Administered 2016-09-06: 500 [IU]
  Filled 2016-09-06: qty 5

## 2016-09-06 MED ORDER — ACETAMINOPHEN 325 MG PO TABS
650.0000 mg | ORAL_TABLET | Freq: Once | ORAL | Status: AC
  Administered 2016-09-06: 650 mg via ORAL

## 2016-09-06 MED ORDER — SODIUM CHLORIDE 0.9 % IV SOLN
Freq: Once | INTRAVENOUS | Status: AC
  Administered 2016-09-06: 11:00:00 via INTRAVENOUS
  Filled 2016-09-06: qty 1.2

## 2016-09-06 MED ORDER — PALONOSETRON HCL INJECTION 0.25 MG/5ML
INTRAVENOUS | Status: AC
  Filled 2016-09-06: qty 5

## 2016-09-06 MED ORDER — SODIUM CHLORIDE 0.9 % IV SOLN
750.0000 mg/m2 | Freq: Once | INTRAVENOUS | Status: AC
  Administered 2016-09-06: 1460 mg via INTRAVENOUS
  Filled 2016-09-06: qty 73

## 2016-09-06 MED ORDER — PALONOSETRON HCL INJECTION 0.25 MG/5ML
0.2500 mg | Freq: Once | INTRAVENOUS | Status: AC
  Administered 2016-09-06: 0.25 mg via INTRAVENOUS

## 2016-09-06 MED ORDER — DIPHENHYDRAMINE HCL 50 MG/ML IJ SOLN
INTRAMUSCULAR | Status: AC
  Filled 2016-09-06: qty 1

## 2016-09-06 MED ORDER — SODIUM CHLORIDE 0.9 % IV SOLN
375.0000 mg/m2 | Freq: Once | INTRAVENOUS | Status: AC
  Administered 2016-09-06: 700 mg via INTRAVENOUS
  Filled 2016-09-06: qty 50

## 2016-09-06 MED ORDER — DOXORUBICIN HCL CHEMO IV INJECTION 2 MG/ML
50.0000 mg/m2 | Freq: Once | INTRAVENOUS | Status: AC
  Administered 2016-09-06: 98 mg via INTRAVENOUS
  Filled 2016-09-06: qty 49

## 2016-09-06 NOTE — Progress Notes (Signed)
1123: Blood return noted before, every 3 cc and after Adriamycin push.

## 2016-09-06 NOTE — Progress Notes (Signed)
[name]    HEMATOLOGY/ONCOLOGY CLINIC NOTE  Date of Service: .09/06/2016  Patient Care Team: [name], MD as PCP - General (Family Medicine) [name] M.D. (ENT)  CHIEF COMPLAINTS/PURPOSE OF CONSULTATION:  f/u Diffuse large B-cell lymphoma   HISTORY OF PRESENTING ILLNESS:   plz see previous note for details of initial presentation.  INTERVAL HISTORY  Lindsay Harrison is here for follow-up prior to her 6th cycle of R CHOP chemotherapy. No fevers no chills no night sweats. Overall feeling well. In good spirits. Has about grade 2 neuropathy in her toes/ball of the foot which is minimally more noticeable. We discussed this and decided to discontinue her vincristine for her last cycle. No overt mucositis. No other acute new symptoms. No fevers or chills or night sweats.  No fevers or chills.  MEDICAL HISTORY:  Past Medical History:  Diagnosis Date  . Anemia    h/o iron deficiency secondary to heavy menses  . Arthritis    left hand index finger  . Diffuse large B cell lymphoma ([place])   . Grief reaction 01/27/2016  . H/O measles    as a child  . History of chicken pox    as a child  . History of PCOS   . Hyperlipidemia, mixed 01/27/2016  . Lymphadenopathy   . Preventative health care 01/27/2016  . Vitamin D deficiency   . Wears glasses     SURGICAL HISTORY: Past Surgical History:  Procedure Laterality Date  . COLONOSCOPY    . IR GENERIC HISTORICAL  05/21/2016   IR FLUORO GUIDE PORT INSERTION RIGHT 05/21/2016 Arne Cleveland, MD [facility]  . IR GENERIC HISTORICAL  05/21/2016   IR US GUIDE VASC ACCESS RIGHT 05/21/2016 Arne Cleveland, MD [facility]  . SKIN BIOPSY Left    face  . THYROGLOSSAL DUCT CYST N/A 04/16/2016   Procedure: THYROGLOSSAL DUCT CYST excision;  Surgeon: [name], MD;  Location: [facility] OR;  Service: ENT;  Laterality: N/A;    SOCIAL HISTORY: Social History   Social History  . Marital status: Married    Spouse name: N/A  . Number of children:  N/A  . Years of education: N/A   Occupational History  . Psychotherapist    Social History Main Topics  . Smoking status: Never Smoker  . Smokeless tobacco: Never Used  . Alcohol use 1.8 - 3.6 oz/week    3 - 6 Standard drinks or equivalent per week     Comment: 5 glasses of wine a week.  . Drug use: No  . Sexual activity: Yes    Partners: Male    Birth control/ protection: None   Other Topics Concern  . Not on file   Social History Narrative   Married. Education: [employer]. Exercise: walking, yoga, and bicycling daily for 1-2 hours. Lives alone, works as Management consultant    FAMILY HISTORY: Family History  Problem Relation Age of Onset  . Arthritis Mother     rheumatoid  . Heart disease Father     CHF  . COPD Sister     Emphysema, h/o cigarettes  . Other Sister     pituatary tumor  . Arthritis Sister   . Obesity Sister   . Cancer Paternal Grandfather     cancer  . Stroke Maternal Aunt     ALLERGIES:  is allergic to erythromycin and other.  MEDICATIONS:  Current Outpatient Prescriptions  Medication Sig Dispense Refill  . aspirin 81 MG tablet Take 81 mg by mouth daily. Has stopped  prior to procedure    . Calcium-Magnesium-Vitamin D (CALCIUM MAGNESIUM PO) Take 1 tablet by mouth daily.    . Cholecalciferol (VITAMIN D) 2000 units CAPS Take 2,000 Units by mouth daily.    [name] dexamethasone (DECADRON) 4 MG tablet TAKE 3 TABLETS BY MOUTH TWICE DAILY, TAKE 1 DOSE IN THE MORNING AND 1 DOSE IN THE EVENING THE DAY PRIOR TO CHEMO 6 tablet 0  . dexamethasone (DECADRON) 4 MG tablet Take 3 tablets (12 mg total) by mouth 2 (two) times daily. Take one dose in the morning and one dose in the evening the day prior to chemo. 6 tablet 2  . lidocaine-prilocaine (EMLA) cream Apply to affected area once 30 g 3  . LORazepam (ATIVAN) 0.5 MG tablet Take 1-2 tablets (0.5-1 mg total) by mouth every 8 (eight) hours. 30 tablet 0  . Omega-3 Fatty Acids (OMEGA-3 PO) Take 2 capsules by mouth daily. Omega  3 1280 mg    . ondansetron (ZOFRAN) 8 MG tablet Take 1 tablet (8 mg total) by mouth 2 (two) times daily as needed for refractory nausea / vomiting. Start on day 3 after chemotherapy 30 tablet 1  . predniSONE (DELTASONE) 20 MG tablet Take 4 tablets (80 mg total) by mouth daily. Take on days 1-5 of chemotherapy. 20 tablet 5  . prochlorperazine (COMPAZINE) 10 MG tablet Take 1 tablet (10 mg total) by mouth every 6 (six) hours as needed (Nausea or vomiting). 30 tablet 6  . Vitamin D, Ergocalciferol, (DRISDOL) 50000 units CAPS capsule TAKE 1 CAPSULE BY MOUTH 1 TIME A WEEK 4 capsule 0   No current facility-administered medications for this visit.    Facility-Administered Medications Ordered in Other Visits  Medication Dose Route Frequency Provider Last Rate Last Dose  . cyclophosphamide (CYTOXAN) 1,460 mg in sodium chloride 0.9 % 250 mL chemo infusion  750 mg/m2 (Treatment Plan Recorded) Intravenous Once Brunetta Genera, MD 646 mL/hr at 09/06/16 1129 1,460 mg at 09/06/16 1129  . heparin lock flush 100 unit/mL  500 Units Intracatheter Once PRN Brunetta Genera, MD      . riTUXimab (RITUXAN) 700 mg in sodium chloride 0.9 % 250 mL (2.1875 mg/mL) chemo infusion  375 mg/m2 (Treatment Plan Recorded) Intravenous Once Brunetta Genera, MD      . sodium chloride flush (NS) 0.9 % injection 10 mL  10 mL Intracatheter PRN Brunetta Genera, MD        REVIEW OF SYSTEMS:    10 Point review of Systems was done is negative except as noted above.  PHYSICAL EXAMINATION: ECOG PERFORMANCE STATUS: 0 - Asymptomatic  Vitals:   09/06/16 0904  BP: (!) 142/68  Pulse: 69  Resp: 18  Temp: 98.5 F (36.9 C)   Filed Weights   09/06/16 0904  Weight: 165 lb 14.4 oz (75.3 kg)   .Body mass index is 25.23 kg/m.  GENERAL:alert, in no acute distress and comfortable SKIN: skin color, texture, turgor are normal, no rashes or significant lesions EYES: normal, conjunctiva are pink and non-injected, sclera  clear OROPHARYNX:no exudate, no erythema and lips, buccal mucosa, and tongue normal  NECK: supple, no JVD, thyroid normal size, non-tender, without nodularity LYMPH:  no palpable lymphadenopathy in the cervical, axillary or inguinal LUNGS: clear to auscultation with normal respiratory effort HEART: regular rate & rhythm,  no murmurs and no lower extremity edema ABDOMEN: abdomen soft, non-tender, normoactive bowel sounds  Musculoskeletal: no cyanosis of digits and no clubbing  PSYCH: alert & oriented x 3  with fluent speech NEURO: no focal motor/sensory deficits  LABORATORY DATA:  I have reviewed the data as listed  . CBC Latest Ref Rng & Units 09/06/2016 08/16/2016 07/26/2016  WBC 3.9 - 10.3 10e3/uL 6.4 7.3 7.8  Hemoglobin 11.6 - 15.9 g/dL 11.2(L) 10.4(L) 11.2(L)  Hematocrit 34.8 - 46.6 % 32.5(L) 31.1(L) 32.4(L)  Platelets 145 - 400 10e3/uL 370 366 416(H)   . CBC    Component Value Date/Time   WBC 6.4 09/06/2016 0753   WBC 4.4 05/21/2016 0928   RBC 3.44 (L) 09/06/2016 0753   RBC 4.45 05/21/2016 0928   HGB 11.2 (L) 09/06/2016 0753   HCT 32.5 (L) 09/06/2016 0753   PLT 370 09/06/2016 0753   MCV 94.6 09/06/2016 0753   MCH 32.7 09/06/2016 0753   MCH 30.3 05/21/2016 0928   MCHC 34.6 09/06/2016 0753   MCHC 34.3 05/21/2016 0928   RDW 16.7 (H) 09/06/2016 0753   LYMPHSABS 0.2 (L) 09/06/2016 0753   MONOABS 0.5 09/06/2016 0753   EOSABS 0.0 09/06/2016 0753   BASOSABS 0.0 09/06/2016 0753    . CMP Latest Ref Rng & Units 09/06/2016 08/16/2016 07/26/2016  Glucose 70 - 140 mg/dl 131 147(H) 171(H)  BUN 7.0 - 26.0 mg/dL 17.0 16.3 15.3  Creatinine 0.6 - 1.1 mg/dL 0.7 0.7 0.7  Sodium 136 - 145 mEq/L 139 141 136  Potassium 3.5 - 5.1 mEq/L 4.1 4.0 4.2  Chloride 96 - 112 mEq/L - - -  CO2 22 - 29 mEq/L _0 Calcium 8.4 - 10.4 mg/dL 9.9 10.0 10.8(H)  Total Protein 6.4 - 8.3 g/dL 6.4 6.4 6.8  Total Bilirubin 0.20 - 1.20 mg/dL 0.39 0.41 0.34  Alkaline Phos 40 - 150 U/L 87 98 103  AST 5 - 34  U/L _1 ALT 0 - 55 U/L _2 Component     Latest Ref Rng & Units 05/07/2016  Hepatitis B Surface Ag     Negative Negative  Hep B Core Ab, Tot     Negative Negative  Hep C Virus Ab     0.0 - 0.9 s/co ratio <0.1   . Lab Results  Component Value Date   LDH 211 07/26/2016         RADIOGRAPHIC STUDIES:   I have personally reviewed the radiological images as listed and agreed with the findings in the report. Nm Pet Image Initial (pi) Skull Base To Thigh  Result Date: 05/12/2016 CLINICAL DATA:  Initial treatment strategy for recently diagnosed diffuse large B-cell lymphoma. EXAM: NUCLEAR MEDICINE PET SKULL BASE TO THIGH TECHNIQUE: 9.2 mCi F-18 FDG was injected intravenously. Full-ring PET imaging was performed from the skull base to thigh after the radiotracer. CT data was obtained and used for attenuation correction and anatomic localization. FASTING BLOOD GLUCOSE:  Value: 98 mg/dl COMPARISON:  CT neck 01/02/2016 FINDINGS: NECK There is prominent but symmetric hypermetabolic activity within the lymphoid tissue of Waldeyer's ring. This has an SUV max of 6.4. There are calcifications within the right palatine tonsil.There are several small hypermetabolic cervical lymph nodes, including a 6 mm right level IIb node (image 17, SUV max 5.0) and a 4 mm left level IIa node (image 23, SUV max 5.3). Previously noted left paramedian level 1 node has apparently been removed. There is some residual low level activity in this area, presumably postoperative. CHEST There are small hypermetabolic lymph nodes within the left hilum (SUV max 4.7) and left axilla (SUV max 6.3). These nodes  are not pathologically enlarged. No suspicious pulmonary activity. The lungs are clear. There is mild atherosclerosis of the aortic arch. ABDOMEN/PELVIS There is no hypermetabolic activity within the liver, adrenal glands, spleen or pancreas. The spleen is normal in size. There are no hypermetabolic or enlarged  intra-abdominal lymph nodes. There is a small left inguinal node which is hypermetabolic (image 027, measuring 7 mm and demonstrating an SUV max of 5.5). SKELETON There is focal hypermetabolic activity within the left aspect of the sternal manubrium, near the sternomanubrial junction. This has an SUV max of 4.1 and correlates with a probable 10 mm sclerotic lesion in this area on CT (image 55). There is also hypermetabolic activity in the left iliac bone (SUV max 5.3). No discrete focal lesion in this area on CT. IMPRESSION: 1. Scattered small mildly hypermetabolic lymph nodes within the neck, left hilum, left axilla and left groin, potentially related the patient's lymphoma. 2. Focal hypermetabolic activity within the left sternal manubrium and left iliac bone, also potentially related to the patient's lymphoma. 3. No evidence of solid visceral organ involvement. Electronically Signed   By: Richardean Sale M.D.   On: 05/12/2016 16:21   Ir US Guide Vasc Access Right  Result Date: 05/21/2016 CLINICAL DATA:  Diffuse large B-cell lymphoma, needs durable venous access for chemotherapy regimen EXAM: TUNNELED PORT CATHETER PLACEMENT WITH ULTRASOUND AND FLUOROSCOPIC GUIDANCE FLUOROSCOPY TIME:  0.1 minute (15 uGym2 DAP) ANESTHESIA/SEDATION: Intravenous Fentanyl and Versed were administered as conscious sedation during continuous monitoring of the patient's level of consciousness and physiological / cardiorespiratory status by the radiology RN, with a total moderate sedation time of 15 minutes. TECHNIQUE: The procedure, risks, benefits, and alternatives were explained to the patient. Questions regarding the procedure were encouraged and answered. The patient understands and consents to the procedure. As antibiotic prophylaxis, cefazolin 2 g was ordered pre-procedure and administered intravenously within one hour of incision. Patency of the right IJ vein was confirmed with ultrasound with image documentation. An  appropriate skin site was determined. Skin site was marked. Region was prepped using maximum barrier technique including cap and mask, sterile gown, sterile gloves, large sterile sheet, and Chlorhexidine as cutaneous antisepsis. The region was infiltrated locally with 1% lidocaine. Under real-time ultrasound guidance, the right IJ vein was accessed with a 21 gauge micropuncture needle; the needle tip within the vein was confirmed with ultrasound image documentation. Needle was exchanged over a 018 guidewire for transitional dilator which allowed passage of the [facility] wire into the IVC. Over this, the transitional dilator was exchanged for a 5 [place] MPA catheter. A small incision was made on the right anterior chest wall and a subcutaneous pocket fashioned. The power-injectable port was positioned and its catheter tunneled to the right IJ dermatotomy site. The MPA catheter was exchanged over an Amplatz wire for a peel-away sheath, through which the port catheter, which had been trimmed to the appropriate length, was advanced and positioned under fluoroscopy with its tip at the cavoatrial junction. Spot chest radiograph confirms good catheter position and no pneumothorax. The pocket was closed with deep interrupted and subcuticular continuous 3-0 Monocryl sutures. The port was flushed per protocol. The incisions were covered with Dermabond then covered with a sterile dressing. COMPLICATIONS: COMPLICATIONS None immediate IMPRESSION: Technically successful right IJ power-injectable port catheter placement. Ready for routine use. Electronically Signed   By: Lucrezia Europe M.D.   On: 05/21/2016 12:50    *[facility]*                  *  [address] [place] [employer]                        [place], [place] [address]                            [phone]  ------------------------------------------------------------------- Transthoracic Echocardiography  Patient:    Lindsay Harrison, Lindsay Harrison MR #:       [id number] Study Date: 05/14/2016 Gender:     F Age:        71 Height:     174 cm Weight:     79 kg BSA:        1.97 m^2 Pt. Status: Room:   SONOGRAPHER  Tresa Res, RDCS  PERFORMING   [facility], Outpatient  ATTENDING    Roselee Nova     [place], [place] Kishore  REFERRING    [place], [place] Kishore  cc:  ------------------------------------------------------------------- LV EF: 55% -   60%  ------------------------------------------------------------------- Indications:      Pre-op evaluation V728.1.  ------------------------------------------------------------------- History:   PMH:  Pre-op evaluation for cheomtherapy. No prior cardiac history.  ------------------------------------------------------------------- Study Conclusions  - Left ventricle: The cavity size was normal. Systolic function was   normal. The estimated ejection fraction was in the range of 55%   to 60%. Wall motion was normal; there were no regional wall   motion abnormalities. Left ventricular diastolic function   parameters were normal. - Left atrium: The atrium was moderately dilated. - Atrial septum: No defect or patent foramen ovale was identified. - Impressions: Normal GLS -21.3  No results found.  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 Focal hypermetabolic activity within the left sternal manubrium and left iliac bone, also potentially related to the patient's lymphoma. No evidence of solid visceral organ involvement.  CT guided bone marrow biopsy done -- no evidence of lymphoma involving Bone marrow. Normal LDH. No constitutional symptoms. Patient has no significant medical  comorbidities at baseline.  ECHO with nl EF as expected.  Patient has completed 5 cycles of R CHOP  PET/CT s/p 3 cycles of R-CHOp shows Resolution of prior nodal and bony hypermetabolic activity compatible with effective response to therapy. No current significant hypermetabolic activity is identified.  2) Rituxan hypersensitivity (mild grade 1 hives on the back - resolved with solumedrol and antihistamines. 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 -Patient appropriate to proceed with sixth and final cycle of R CHOP today. -We'll hold off on vincristine with this cycle due to her neuropathy. -Would do standard Rituxan protocol and not the rapid protocol. -counseled on infection prevention strategies. -We will plan to repeat a PET CT in 5 weeks  -We'll plan to do maintenance Rituxan after completion of planned R CHOP treatment  RTC in 6 weeks with labs and PET/CT PET/CT in 5 weeks  . Orders Placed This Encounter  Procedures  . NM PET Image Restag (PS) Skull Base To Thigh  Standing Status:   Future    Standing Expiration Date:   09/06/2017    Order Specific Question:   Reason for Exam (SYMPTOM  OR DIAGNOSIS REQUIRED)    Answer:   re-evaluation fo DLBCL/FL post planned 6 cycles of R-CHOP    Order Specific Question:   If indicated for the ordered procedure, I authorize the administration of a radiopharmaceutical per Radiology protocol    Answer:   Yes    Order Specific Question:   Preferred imaging location?    Answer:   [facility]    Order Specific Question:   Radiology Contrast Protocol - do NOT remove file path    Answer:   _0 [email or web address]  . CBC & Diff and Retic    Standing Status:   Future    Standing Expiration Date:   09/06/2017  . Comprehensive metabolic panel    Standing Status:   Future    Standing Expiration Date:   09/06/2017  .  Lactate dehydrogenase    Standing Status:   Future    Standing Expiration Date:   09/06/2017    All of the patients questions were answered with apparent satisfaction. The patient knows to call the clinic with any problems, questions or concerns.  I spent 20 minutes counseling the patient face to face. The total time spent in the appointment was 25 minutes and more than 50% was on counseling and direct patient cares.    Sullivan Lone MD [place] AAHIVMS [facility] Hematology/Oncology Physician [facility]  (Office):       [phone] (Work cell):  [phone] (Fax):           [phone]

## 2016-09-06 NOTE — Patient Instructions (Signed)
Thank you for choosing [facility] to provide your oncology and hematology care.  To afford each patient quality time with our providers, please arrive 30 minutes before your scheduled appointment time.  If you arrive late for your appointment, you may be asked to reschedule.  We strive to give you quality time with our providers, and arriving late affects you and other patients whose appointments are after yours.  If you are a no show for multiple scheduled visits, you may be dismissed from the clinic at the providers discretion.   Again, thank you for choosing [facility], our hope is that these requests will decrease the amount of time that you wait before being seen by our physicians.  ______________________________________________________________________ Should you have questions after your visit to the  Cancer Center, please contact our office at [phone] between the hours of 8:30 and 4:30 p.m.    Voicemails left after 4:30p.m will not be returned until the following business day.   For prescription refill requests, please have your pharmacy contact us directly.  Please also try to allow 48 hours for prescription requests.   Please contact the scheduling department for questions regarding scheduling.  For scheduling of procedures such as PET scans, CT scans, MRI, Ultrasound, etc please contact central scheduling at [phone].   Resources For Cancer Patients and Caregivers:  American Cancer Society:  800-227-2345  Can help patients locate various types of support and financial assistance Cancer Care: 1-800-813-HOPE (4673) Provides financial assistance, online support groups, medication/co-pay assistance.   [place] DSS:  336-641-3447 Where to apply for food stamps, Medicaid, and utility assistance Medicare Rights Center: 800-333-4114 Helps people with Medicare understand their rights and benefits, navigate the Medicare system, and secure the  quality healthcare they deserve SCAT: 336-333-6589 [place] [employer]'s shared-ride transportation service for eligible riders who have a disability that prevents them from riding the fixed route bus.   For additional information on assistance programs please contact our social worker:   Grier Hock/Abigail Elmore:  [phone] 

## 2016-09-06 NOTE — Patient Instructions (Signed)
[facility] Discharge Instructions for Patients Receiving Chemotherapy  Today you received the following chemotherapy agents: Adriamycin, Cytoxan and Rituxan   To help prevent nausea and vomiting after your treatment, we encourage you to take your nausea medication as directed.    If you develop nausea and vomiting that is not controlled by your nausea medication, call the clinic.   BELOW ARE SYMPTOMS THAT SHOULD BE REPORTED IMMEDIATELY:  *FEVER GREATER THAN 100.5 F  *CHILLS WITH OR WITHOUT FEVER  NAUSEA AND VOMITING THAT IS NOT CONTROLLED WITH YOUR NAUSEA MEDICATION  *UNUSUAL SHORTNESS OF BREATH  *UNUSUAL BRUISING OR BLEEDING  TENDERNESS IN MOUTH AND THROAT WITH OR WITHOUT PRESENCE OF ULCERS  *URINARY PROBLEMS  *BOWEL PROBLEMS  UNUSUAL RASH Items with * indicate a potential emergency and should be followed up as soon as possible.  Feel free to call the clinic you have any questions or concerns. The clinic phone number is [phone].  Please show the [place] at check-in to the Emergency Department and triage nurse.

## 2016-09-06 NOTE — Telephone Encounter (Signed)
Appointments scheduled per 5.7.18 LOS. Patient will pick up print outs in infusion room.

## 2016-09-08 ENCOUNTER — Ambulatory Visit (HOSPITAL_BASED_OUTPATIENT_CLINIC_OR_DEPARTMENT_OTHER): Payer: Medicare Other

## 2016-09-08 VITALS — BP 132/82 | HR 74 | Temp 98.9°F | Resp 20

## 2016-09-08 DIAGNOSIS — Z5189 Encounter for other specified aftercare: Secondary | ICD-10-CM

## 2016-09-08 DIAGNOSIS — C8338 Diffuse large B-cell lymphoma, lymph nodes of multiple sites: Secondary | ICD-10-CM

## 2016-09-08 MED ORDER — PEGFILGRASTIM INJECTION 6 MG/0.6ML ~~LOC~~
6.0000 mg | PREFILLED_SYRINGE | Freq: Once | SUBCUTANEOUS | Status: AC
  Administered 2016-09-08: 6 mg via SUBCUTANEOUS
  Filled 2016-09-08: qty 0.6

## 2016-09-08 NOTE — Patient Instructions (Signed)
Pegfilgrastim injection What is this medicine? PEGFILGRASTIM (PEG fil gra stim) is a long-acting granulocyte colony-stimulating factor that stimulates the growth of neutrophils, a type of white blood cell important in the body's fight against infection. It is used to reduce the incidence of fever and infection in patients with certain types of cancer who are receiving chemotherapy that affects the bone marrow, and to increase survival after being exposed to high doses of radiation. This medicine may be used for other purposes; ask your health care provider or pharmacist if you have questions. COMMON BRAND NAME(S): Neulasta What should I tell my health care provider before I take this medicine? They need to know if you have any of these conditions: -kidney disease -latex allergy -ongoing radiation therapy -sickle cell disease -skin reactions to acrylic adhesives (On-Body Injector only) -an unusual or allergic reaction to pegfilgrastim, filgrastim, other medicines, foods, dyes, or preservatives -pregnant or trying to get pregnant -breast-feeding How should I use this medicine? This medicine is for injection under the skin. If you get this medicine at home, you will be taught how to prepare and give the pre-filled syringe or how to use the On-body Injector. Refer to the patient Instructions for Use for detailed instructions. Use exactly as directed. Take your medicine at regular intervals. Do not take your medicine more often than directed. It is important that you put your used needles and syringes in a special sharps container. Do not put them in a trash can. If you do not have a sharps container, call your pharmacist or healthcare provider to get one. Talk to your pediatrician regarding the use of this medicine in children. While this drug may be prescribed for selected conditions, precautions do apply. Overdosage: If you think you have taken too much of this medicine contact a poison control  center or emergency room at once. NOTE: This medicine is only for you. Do not share this medicine with others. What if I miss a dose? It is important not to miss your dose. Call your doctor or health care professional if you miss your dose. If you miss a dose due to an On-body Injector failure or leakage, a new dose should be administered as soon as possible using a single prefilled syringe for manual use. What may interact with this medicine? Interactions have not been studied. Give your health care provider a list of all the medicines, herbs, non-prescription drugs, or dietary supplements you use. Also tell them if you smoke, drink alcohol, or use illegal drugs. Some items may interact with your medicine. This list may not describe all possible interactions. Give your health care provider a list of all the medicines, herbs, non-prescription drugs, or dietary supplements you use. Also tell them if you smoke, drink alcohol, or use illegal drugs. Some items may interact with your medicine. What should I watch for while using this medicine? You may need blood work done while you are taking this medicine. If you are going to need a MRI, CT scan, or other procedure, tell your doctor that you are using this medicine (On-Body Injector only). What side effects may I notice from receiving this medicine? Side effects that you should report to your doctor or health care professional as soon as possible: -allergic reactions like skin rash, itching or hives, swelling of the face, lips, or tongue -dizziness -fever -pain, redness, or irritation at site where injected -pinpoint red spots on the skin -red or dark-brown urine -shortness of breath or breathing problems -stomach or   side pain, or pain at the shoulder -swelling -tiredness -trouble passing urine or change in the amount of urine Side effects that usually do not require medical attention (report to your doctor or health care professional if they  continue or are bothersome): -bone pain -muscle pain This list may not describe all possible side effects. Call your doctor for medical advice about side effects. You may report side effects to FDA at 1-800-FDA-1088. Where should I keep my medicine? Keep out of the reach of children. Store pre-filled syringes in a refrigerator between 2 and 8 degrees C (36 and 46 degrees F). Do not freeze. Keep in carton to protect from light. Throw away this medicine if it is left out of the refrigerator for more than 48 hours. Throw away any unused medicine after the expiration date. NOTE: This sheet is a summary. It may not cover all possible information. If you have questions about this medicine, talk to your doctor, pharmacist, or health care provider.  2017 Elsevier/Gold Standard (2014-05-09 14:30:14)  

## 2016-10-10 ENCOUNTER — Encounter: Payer: Self-pay | Admitting: Family Medicine

## 2016-10-10 ENCOUNTER — Encounter: Payer: Self-pay | Admitting: Hematology

## 2016-10-11 ENCOUNTER — Ambulatory Visit (HOSPITAL_COMMUNITY)
Admission: RE | Admit: 2016-10-11 | Discharge: 2016-10-11 | Disposition: A | Discharge status: Home / Self Care | Payer: Medicare Other | Source: Ambulatory Visit | Attending: Hematology | Admitting: Hematology

## 2016-10-11 DIAGNOSIS — C8330 Diffuse large B-cell lymphoma, unspecified site: Secondary | ICD-10-CM | POA: Diagnosis not present

## 2016-10-11 DIAGNOSIS — C8338 Diffuse large B-cell lymphoma, lymph nodes of multiple sites: Secondary | ICD-10-CM

## 2016-10-11 LAB — GLUCOSE, CAPILLARY: Glucose-Capillary: 91 mg/dL (ref 65–99)

## 2016-10-18 ENCOUNTER — Other Ambulatory Visit (HOSPITAL_BASED_OUTPATIENT_CLINIC_OR_DEPARTMENT_OTHER): Payer: Medicare Other

## 2016-10-18 ENCOUNTER — Ambulatory Visit (HOSPITAL_BASED_OUTPATIENT_CLINIC_OR_DEPARTMENT_OTHER): Payer: Medicare Other | Admitting: Hematology

## 2016-10-18 ENCOUNTER — Telehealth: Payer: Self-pay | Admitting: Hematology

## 2016-10-18 ENCOUNTER — Encounter: Payer: Self-pay | Admitting: Hematology

## 2016-10-18 VITALS — BP 137/84 | HR 72 | Temp 99.2°F | Resp 18 | Ht 68.0 in | Wt 162.7 lb

## 2016-10-18 DIAGNOSIS — C8590 Non-Hodgkin lymphoma, unspecified, unspecified site: Secondary | ICD-10-CM | POA: Insufficient documentation

## 2016-10-18 DIAGNOSIS — G620 Drug-induced polyneuropathy: Secondary | ICD-10-CM

## 2016-10-18 DIAGNOSIS — C8338 Diffuse large B-cell lymphoma, lymph nodes of multiple sites: Secondary | ICD-10-CM

## 2016-10-18 DIAGNOSIS — Z23 Encounter for immunization: Secondary | ICD-10-CM | POA: Diagnosis present

## 2016-10-18 DIAGNOSIS — Z8572 Personal history of non-Hodgkin lymphomas: Secondary | ICD-10-CM | POA: Insufficient documentation

## 2016-10-18 DIAGNOSIS — C8298 Follicular lymphoma, unspecified, lymph nodes of multiple sites: Secondary | ICD-10-CM

## 2016-10-18 DIAGNOSIS — Z95828 Presence of other vascular implants and grafts: Secondary | ICD-10-CM

## 2016-10-18 LAB — COMPREHENSIVE METABOLIC PANEL
ALT: 25 U/L (ref 0–55)
AST: 18 U/L (ref 5–34)
Albumin: 4 g/dL (ref 3.5–5.0)
Alkaline Phosphatase: 122 U/L (ref 40–150)
Anion Gap: 8 mEq/L (ref 3–11)
BUN: 11.5 mg/dL (ref 7.0–26.0)
CO2: 27 mEq/L (ref 22–29)
Calcium: 9.8 mg/dL (ref 8.4–10.4)
Chloride: 105 mEq/L (ref 98–109)
Creatinine: 0.7 mg/dL (ref 0.6–1.1)
EGFR: 87 mL/min/{1.73_m2} — ABNORMAL LOW (ref >90)
Glucose: 85 mg/dl (ref 70–140)
Potassium: 4.3 mEq/L (ref 3.5–5.1)
Sodium: 140 mEq/L (ref 136–145)
Total Bilirubin: 0.4 mg/dL (ref 0.20–1.20)
Total Protein: 6.6 g/dL (ref 6.4–8.3)

## 2016-10-18 LAB — CBC & DIFF AND RETIC
BASO%: 0.7 % (ref 0.0–2.0)
Basophils Absolute: 0 10*3/uL (ref 0.0–0.1)
EOS%: 3.6 % (ref 0.0–7.0)
Eosinophils Absolute: 0.2 10*3/uL (ref 0.0–0.5)
HCT: 38.8 % (ref 34.8–46.6)
HGB: 12.9 g/dL (ref 11.6–15.9)
Immature Retic Fract: 2 % (ref 1.60–10.00)
LYMPH%: 16.5 % (ref 14.0–49.7)
MCH: 31.5 pg (ref 25.1–34.0)
MCHC: 33.2 g/dL (ref 31.5–36.0)
MCV: 94.6 fL (ref 79.5–101.0)
MONO#: 0.5 10*3/uL (ref 0.1–0.9)
MONO%: 12.2 % (ref 0.0–14.0)
NEUT#: 3 10*3/uL (ref 1.5–6.5)
NEUT%: 67 % (ref 38.4–76.8)
Platelets: 254 10*3/uL (ref 145–400)
RBC: 4.1 10*6/uL (ref 3.70–5.45)
RDW: 13.5 % (ref 11.2–14.5)
Retic %: 0.8 % (ref 0.70–2.10)
Retic Ct Abs: 32.8 10*3/uL — ABNORMAL LOW (ref 33.70–90.70)
WBC: 4.4 10*3/uL (ref 3.9–10.3)
lymph#: 0.7 10*3/uL — ABNORMAL LOW (ref 0.9–3.3)

## 2016-10-18 LAB — LACTATE DEHYDROGENASE: LDH: 206 U/L (ref 125–245)

## 2016-10-18 MED ORDER — PNEUMOCOCCAL 13-VAL CONJ VACC IM SUSP: 0.5000 mL | Freq: Once | INTRAMUSCULAR | Status: DC

## 2016-10-18 MED ORDER — CHLORHEXIDINE GLUCONATE 0.12 % MT SOLN: 15.0000 mL | Freq: Two times a day (BID) | OROMUCOSAL | 0 refills | Status: DC

## 2016-10-18 MED ORDER — PNEUMOCOCCAL VAC POLYVALENT 25 MCG/0.5ML IJ INJ
0.5000 mL | INJECTION | Freq: Once | INTRAMUSCULAR | Status: AC
  Administered 2016-10-18: 0.5 mL via INTRAMUSCULAR
  Filled 2016-10-18: qty 0.5

## 2016-10-18 MED ORDER — DEXAMETHASONE 4 MG PO TABS: ORAL_TABLET | ORAL | 6 refills | Status: DC

## 2016-10-18 NOTE — Telephone Encounter (Signed)
Scheduled appt per 6/18 los - Gave patient AVS and calender per LOS.  

## 2016-10-18 NOTE — Patient Instructions (Signed)
Thank you for choosing [facility] to provide your oncology and hematology care.  To afford each patient quality time with our providers, please arrive 30 minutes before your scheduled appointment time.  If you arrive late for your appointment, you may be asked to reschedule.  We strive to give you quality time with our providers, and arriving late affects you and other patients whose appointments are after yours.  If you are a no show for multiple scheduled visits, you may be dismissed from the clinic at the providers discretion.   Again, thank you for choosing [facility], our hope is that these requests will decrease the amount of time that you wait before being seen by our physicians.  ______________________________________________________________________ Should you have questions after your visit to the [facility], please contact our office at [phone] between the hours of 8:30 and 4:30 p.m.    Voicemails left after 4:30p.m will not be returned until the following business day.   For prescription refill requests, please have your pharmacy contact us directly.  Please also try to allow 48 hours for prescription requests.   Please contact the scheduling department for questions regarding scheduling.  For scheduling of procedures such as PET scans, CT scans, MRI, Ultrasound, etc please contact central scheduling at [phone].   Resources For Cancer Patients and Caregivers:  American Cancer Society:  800-227-2345  Can help patients locate various types of support and financial assistance Cancer Care: 1-800-813-HOPE (4673) Provides financial assistance, online support groups, medication/co-pay assistance.   [place] DSS:  336-641-3447 Where to apply for food stamps, Medicaid, and utility assistance Medicare Rights Center: 800-333-4114 Helps people with Medicare understand their rights and benefits, navigate the Medicare system, and secure the  quality healthcare they deserve SCAT: 336-333-6589 [place] [employer]'s shared-ride transportation service for eligible riders who have a disability that prevents them from riding the fixed route bus.   For additional information on assistance programs please contact our social worker:   Grier Hock/Abigail Elmore:  [phone] 

## 2016-10-18 NOTE — Progress Notes (Signed)
[name]    HEMATOLOGY/ONCOLOGY CLINIC NOTE  Date of Service: .10/18/2016  Patient Care Team: [name], MD as PCP - General (Family Medicine) [name] M.D. (ENT)  CHIEF COMPLAINTS/PURPOSE OF CONSULTATION:  f/u Diffuse large B-cell lymphoma   HISTORY OF PRESENTING ILLNESS:   plz see previous note for details of initial presentation.  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 She has completed her 6 cycles of R CHOP and had a repeat PET/CT scan on 10/11/2016 that shows complete metabolic remission. She is very glad with the results of her PET scan. She is feeling good and has been traveling with friends and had a good time at the beach in [place]. No fevers no chills no night sweats. Has about grade 1 neuropathy in her toes/ball of the foot which is resolving . No other acute new symptoms. We discussed removal of port and she is agreeable with this. We discussed the pros and cons of maintenance Rituxan and she would like to proceed with this. She last received her Prevnar vaccine in 2015 and pneumococcal conjugate vaccine in 2010. We shall proceed with giving her PCV vaccine today.  MEDICAL HISTORY:  Past Medical History:  Diagnosis Date  . Anemia    h/o iron deficiency secondary to heavy menses  . Arthritis    left hand index finger  . Diffuse large B cell lymphoma ([place])   . Grief reaction 01/27/2016  . H/O measles    as a child  . History of chicken pox    as a child  . History of PCOS   . Hyperlipidemia, mixed 01/27/2016  . Lymphadenopathy   . Preventative health care 01/27/2016  . Vitamin D deficiency   . Wears glasses     SURGICAL HISTORY: Past Surgical History:  Procedure Laterality Date  . COLONOSCOPY    . IR GENERIC HISTORICAL  05/21/2016   IR FLUORO GUIDE PORT INSERTION RIGHT 05/21/2016 Arne Cleveland, MD [facility]  . IR GENERIC HISTORICAL  05/21/2016   IR US GUIDE VASC ACCESS  RIGHT 05/21/2016 Arne Cleveland, MD [facility]  . SKIN BIOPSY Left    face  . THYROGLOSSAL DUCT CYST N/A 04/16/2016   Procedure: THYROGLOSSAL DUCT CYST excision;  Surgeon: [name], MD;  Location: [facility] OR;  Service: ENT;  Laterality: N/A;    SOCIAL HISTORY: Social History   Social History  . Marital status: Married    Spouse name: N/A  . Number of children: N/A  . Years of education: N/A   Occupational History  . Psychotherapist    Social History Main Topics  . Smoking status: Never Smoker  . Smokeless tobacco: Never Used  . Alcohol use 1.8 - 3.6 oz/week    3 - 6 Standard drinks or equivalent per week     Comment: 5 glasses of wine a week.  . Drug use: No  . Sexual activity: Yes    Partners: Male    Birth control/ protection: None   Other Topics Concern  . Not on file   Social History Narrative   Married. Education: [employer]. Exercise: walking, yoga, and bicycling daily for 1-2 hours. Lives alone, works as Management consultant    FAMILY HISTORY: Family History  Problem Relation Age of Onset  . Arthritis Mother        rheumatoid  . Heart disease Father        CHF  . COPD Sister  Emphysema, h/o cigarettes  . Other Sister        pituatary tumor  . Arthritis Sister   . Obesity Sister   . Cancer Paternal Grandfather        cancer  . Stroke Maternal Aunt     ALLERGIES:  is allergic to erythromycin and other.  MEDICATIONS:  Current Outpatient Prescriptions  Medication Sig Dispense Refill  . aspirin 81 MG tablet Take 81 mg by mouth daily. Has stopped prior to procedure    . Calcium-Magnesium-Vitamin D (CALCIUM MAGNESIUM PO) Take 1 tablet by mouth daily.    . chlorhexidine (PERIDEX) 0.12 % solution Use as directed 15 mLs in the mouth or throat 2 (two) times daily. 473 mL 0  . Cholecalciferol (VITAMIN D) 2000 units CAPS Take 2,000 Units by mouth daily.    [name] dexamethasone (DECADRON) 4 MG tablet TAKE 3 TABLETS BY MOUTH TWICE DAILY, TAKE 1 DOSE IN THE MORNING  AND 1 DOSE IN THE EVENING THE DAY PRIOR TO CHEMO 6 tablet 0  . Omega-3 Fatty Acids (OMEGA-3 PO) Take 2 capsules by mouth daily. Omega 3 1280 mg    . Vitamin D, Ergocalciferol, (DRISDOL) 50000 units CAPS capsule TAKE 1 CAPSULE BY MOUTH 1 TIME A WEEK 4 capsule 0   No current facility-administered medications for this visit.     REVIEW OF SYSTEMS:    10 Point review of Systems was done is negative except as noted above.  PHYSICAL EXAMINATION: ECOG PERFORMANCE STATUS: 0 - Asymptomatic  Vitals:   10/18/16 1030  BP: 137/84  Pulse: 72  Resp: 18  Temp: 99.2 F (37.3 C)   Filed Weights   10/18/16 1030  Weight: 162 lb 11.2 oz (73.8 kg)   .Body mass index is 24.74 kg/m.  GENERAL:alert, in no acute distress and comfortable SKIN: skin color, texture, turgor are normal, no rashes or significant lesions EYES: normal, conjunctiva are pink and non-injected, sclera clear OROPHARYNX:no exudate, no erythema and lips, buccal mucosa, and tongue normal  NECK: supple, no JVD, thyroid normal size, non-tender, without nodularity LYMPH:  no palpable lymphadenopathy in the cervical, axillary or inguinal LUNGS: clear to auscultation with normal respiratory effort HEART: regular rate & rhythm,  no murmurs and no lower extremity edema ABDOMEN: abdomen soft, non-tender, normoactive bowel sounds  Musculoskeletal: no cyanosis of digits and no clubbing  PSYCH: alert & oriented x 3 with fluent speech NEURO: no focal motor/sensory deficits  LABORATORY DATA:  I have reviewed the data as listed  . CBC Latest Ref Rng & Units 10/18/2016 09/06/2016 08/16/2016  WBC 3.9 - 10.3 10e3/uL 4.4 6.4 7.3  Hemoglobin 11.6 - 15.9 g/dL 12.9 11.2(L) 10.4(L)  Hematocrit 34.8 - 46.6 % 38.8 32.5(L) 31.1(L)  Platelets 145 - 400 10e3/uL 254 370 366   . CBC    Component Value Date/Time   WBC 4.4 10/18/2016 0958   WBC 4.4 05/21/2016 0928   RBC 4.10 10/18/2016 0958   RBC 4.45 05/21/2016 0928   HGB 12.9 10/18/2016 0958    HCT 38.8 10/18/2016 0958   PLT 254 10/18/2016 0958   MCV 94.6 10/18/2016 0958   MCH 31.5 10/18/2016 0958   MCH 30.3 05/21/2016 0928   MCHC 33.2 10/18/2016 0958   MCHC 34.3 05/21/2016 0928   RDW 13.5 10/18/2016 0958   LYMPHSABS 0.7 (L) 10/18/2016 0958   MONOABS 0.5 10/18/2016 0958   EOSABS 0.2 10/18/2016 0958   BASOSABS 0.0 10/18/2016 0958    . CMP Latest Ref Rng & Units 10/18/2016  09/06/2016 08/16/2016  Glucose 70 - 140 mg/dl 85 131 147(H)  BUN 7.0 - 26.0 mg/dL 11.5 17.0 16.3  Creatinine 0.6 - 1.1 mg/dL 0.7 0.7 0.7  Sodium 136 - 145 mEq/L 140 139 141  Potassium 3.5 - 5.1 mEq/L 4.3 4.1 4.0  Chloride 96 - 112 mEq/L - - -  CO2 22 - 29 mEq/L _0 Calcium 8.4 - 10.4 mg/dL 9.8 9.9 10.0  Total Protein 6.4 - 8.3 g/dL 6.6 6.4 6.4  Total Bilirubin 0.20 - 1.20 mg/dL 0.40 0.39 0.41  Alkaline Phos 40 - 150 U/L 122 87 98  AST 5 - 34 U/L _1 ALT 0 - 55 U/L _2 Component     Latest Ref Rng & Units 05/07/2016  Hepatitis B Surface Ag     Negative Negative  Hep B Core Ab, Tot     Negative Negative  Hep C Virus Ab     0.0 - 0.9 s/co ratio <0.1   . Lab Results  Component Value Date   LDH 206 10/18/2016         RADIOGRAPHIC STUDIES:   Nm Pet Image Restag (ps) Skull Base To Thigh  Result Date: 10/11/2016 CLINICAL DATA:  Subsequent treatment strategy for diffuse large B-cell lymphoma. EXAM: NUCLEAR MEDICINE PET SKULL BASE TO THIGH TECHNIQUE: 8.9 mCi F-18 FDG was injected intravenously. Full-ring PET imaging was performed from the skull base to thigh after the radiotracer. CT data was obtained and used for attenuation correction and anatomic localization. FASTING BLOOD GLUCOSE:  Value: 91 mg/dl COMPARISON:  PET-CT 05/12/2016 and 07/23/2016. FINDINGS: NECK No hypermetabolic cervical lymph nodes are identified.There are no lesions of the pharyngeal mucosal space. CHEST There are no hypermetabolic or enlarged mediastinal, hilar or axillary lymph nodes. There is no suspicious  pulmonary activity. Mild tree-in-bud nodularity in the left upper lobe (images 18-22) is likely inflammatory. There are no suspicious pulmonary nodules. Right IJ Port-A-Cath extends to the superior cavoatrial junction. ABDOMEN/PELVIS There is no hypermetabolic activity within the liver, adrenal glands, spleen or pancreas. There is no hypermetabolic nodal activity. Again demonstrated is a probable small cyst in the left hepatic lobe and mild aortoiliac atherosclerosis. SKELETON There is no hypermetabolic activity to suggest osseous metastatic disease. IMPRESSION: 1. No evidence of recurrent lymphoma or other suspicious findings. 2. Probable minimal pulmonary inflammatory changes in the left upper lobe. Electronically Signed   By: Richardean Sale M.D.   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 Focal hypermetabolic activity within the left sternal manubrium and left iliac bone, also potentially related to the patient's lymphoma. No evidence of solid visceral organ involvement.  CT guided bone marrow biopsy done -- no evidence of lymphoma involving Bone marrow. Normal LDH. No constitutional symptoms. Patient has no significant medical comorbidities at baseline.  ECHO with nl EF as expected.  Patient has completed 6 cycles of R CHOP  PET/CT s/p 6 cycles of R-CHOP shows complete metabolic response  2) Rituxan hypersensitivity (mild grade 1 hives on the back - resolved with solumedrol and antihistamines. 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 -PET/CT scan showing metabolic complete response of her  lymphoma were reviewed in detail with the patient and she is understandably quite glad with this. -Neuropathy has significantly resolved. No other primitive toxicities at this time. -We discussed the pros and cons of maintenance Rituxan and she is in favor of this. Maintenance Rituxan orders have been placed. Would do standard protocol and not the rapid protocol given previous hypersensitivity. -Patient will continue dexamethasone premedication to reduce risk of Rituxan hypersensitivity -Patient is okay with having her Port-A-Cath removed. Order placed prior to have her port removed. -counseled on infection prevention strategies. -But immunization records she has had her Prevnar vaccine in 2015 and PCV in 2010. We will get PCV repeat vaccination today. -No live virus vaccines for 2 years after chemotherapy.  PCV vaccine today Schedule for maintenance Rituxan starting 7/9/018 and q60 days Labs q60 days with each treatment RTC with Dr Irene Limbo with labs with C2 of maintenance Rituxan (01/07/2017) IR referral for port-a-cath removal on 6/19 and 6/22 . Orders Placed This Encounter  Procedures  . IR Removal Tun Access W/ Port W/O [facility]    6/19 or 6/22 if possible    Standing Status:   Future    Standing Expiration Date:   12/18/2017    Order Specific Question:   Reason for exam:    Answer:   Port-a-cath removal s/p completion of chemotherapy    Order Specific Question:   Preferred Imaging Location?    Answer:   [facility]  . CBC & Diff and Retic    Standing Status:   Standing    Number of Occurrences:   6    Standing Expiration Date:   10/18/2017  . Comprehensive metabolic panel    Standing Status:   Standing    Number of Occurrences:   6    Standing Expiration Date:   10/18/2017  . Lactate dehydrogenase    Standing Status:   Standing    Number of Occurrences:   6    Standing Expiration Date:   10/18/2017    All of the patients questions were answered with apparent satisfaction. The  patient knows to call the clinic with any problems, questions or concerns.  I spent 25 minutes counseling the patient face to face. The total time spent in the appointment was 40 minutes and more than 50% was on counseling and direct patient cares.    Sullivan Lone MD [place] AAHIVMS [facility] Hematology/Oncology [facility]  (Office):       [phone] (Work cell):  [phone] (Fax):           [phone]

## 2016-10-26 ENCOUNTER — Other Ambulatory Visit: Payer: Self-pay | Admitting: Radiology

## 2016-10-26 ENCOUNTER — Other Ambulatory Visit: Payer: Self-pay | Admitting: General Surgery

## 2016-10-27 ENCOUNTER — Ambulatory Visit (HOSPITAL_COMMUNITY)
Admission: RE | Admit: 2016-10-27 | Discharge: 2016-10-27 | Disposition: A | Discharge status: Home / Self Care | Payer: Medicare Other | Source: Ambulatory Visit | Attending: Hematology | Admitting: Hematology

## 2016-10-27 ENCOUNTER — Encounter (HOSPITAL_COMMUNITY): Payer: Self-pay

## 2016-10-27 ENCOUNTER — Other Ambulatory Visit (HOSPITAL_COMMUNITY): Payer: Medicare Other

## 2016-10-27 DIAGNOSIS — Z7982 Long term (current) use of aspirin: Secondary | ICD-10-CM | POA: Insufficient documentation

## 2016-10-27 DIAGNOSIS — M19042 Primary osteoarthritis, left hand: Secondary | ICD-10-CM | POA: Diagnosis not present

## 2016-10-27 DIAGNOSIS — Z452 Encounter for adjustment and management of vascular access device: Secondary | ICD-10-CM | POA: Diagnosis not present

## 2016-10-27 DIAGNOSIS — E782 Mixed hyperlipidemia: Secondary | ICD-10-CM | POA: Insufficient documentation

## 2016-10-27 DIAGNOSIS — C8330 Diffuse large B-cell lymphoma, unspecified site: Secondary | ICD-10-CM | POA: Diagnosis not present

## 2016-10-27 DIAGNOSIS — E559 Vitamin D deficiency, unspecified: Secondary | ICD-10-CM | POA: Insufficient documentation

## 2016-10-27 DIAGNOSIS — Z8572 Personal history of non-Hodgkin lymphomas: Secondary | ICD-10-CM | POA: Insufficient documentation

## 2016-10-27 DIAGNOSIS — Z95828 Presence of other vascular implants and grafts: Secondary | ICD-10-CM

## 2016-10-27 HISTORY — PX: IR REMOVAL TUN ACCESS W/ PORT W/O FL MOD SED: IMG2290

## 2016-10-27 LAB — CBC WITH DIFFERENTIAL/PLATELET
Basophils Absolute: 0 10*3/uL (ref 0.0–0.1)
Basophils Relative: 1 %
Eosinophils Absolute: 0.2 10*3/uL (ref 0.0–0.7)
Eosinophils Relative: 7 %
HCT: 39.9 % (ref 36.0–46.0)
Hemoglobin: 13.6 g/dL (ref 12.0–15.0)
Lymphocytes Relative: 27 %
Lymphs Abs: 0.9 10*3/uL (ref 0.7–4.0)
MCH: 30.8 pg (ref 26.0–34.0)
MCHC: 34.1 g/dL (ref 30.0–36.0)
MCV: 90.5 fL (ref 78.0–100.0)
Monocytes Absolute: 0.4 10*3/uL (ref 0.1–1.0)
Monocytes Relative: 13 %
Neutro Abs: 1.7 10*3/uL (ref 1.7–7.7)
Neutrophils Relative %: 52 %
Platelets: 272 10*3/uL (ref 150–400)
RBC: 4.41 MIL/uL (ref 3.87–5.11)
RDW: 13 % (ref 11.5–15.5)
WBC: 3.2 10*3/uL — ABNORMAL LOW (ref 4.0–10.5)

## 2016-10-27 LAB — PROTIME-INR
INR: 1
Prothrombin Time: 13.2 seconds (ref 11.4–15.2)

## 2016-10-27 MED ORDER — CEFAZOLIN SODIUM-DEXTROSE 2-4 GM/100ML-% IV SOLN
INTRAVENOUS | Status: AC
  Filled 2016-10-27: qty 100

## 2016-10-27 MED ORDER — MIDAZOLAM HCL 2 MG/2ML IJ SOLN
INTRAMUSCULAR | Status: AC | PRN
  Administered 2016-10-27 (×2): 1 mg via INTRAVENOUS

## 2016-10-27 MED ORDER — CEFAZOLIN SODIUM-DEXTROSE 2-4 GM/100ML-% IV SOLN
2.0000 g | INTRAVENOUS | Status: AC
  Administered 2016-10-27: 2 g via INTRAVENOUS

## 2016-10-27 MED ORDER — FENTANYL CITRATE (PF) 100 MCG/2ML IJ SOLN
INTRAMUSCULAR | Status: AC
  Filled 2016-10-27: qty 4

## 2016-10-27 MED ORDER — FENTANYL CITRATE (PF) 100 MCG/2ML IJ SOLN
INTRAMUSCULAR | Status: AC | PRN
  Administered 2016-10-27 (×2): 50 ug via INTRAVENOUS

## 2016-10-27 MED ORDER — MIDAZOLAM HCL 2 MG/2ML IJ SOLN
INTRAMUSCULAR | Status: AC
  Filled 2016-10-27: qty 4

## 2016-10-27 MED ORDER — NALOXONE HCL 0.4 MG/ML IJ SOLN
INTRAMUSCULAR | Status: AC
  Filled 2016-10-27: qty 1

## 2016-10-27 MED ORDER — LIDOCAINE-EPINEPHRINE (PF) 2 %-1:200000 IJ SOLN
INTRAMUSCULAR | Status: AC
  Filled 2016-10-27: qty 20

## 2016-10-27 MED ORDER — SODIUM CHLORIDE 0.9 % IV SOLN
INTRAVENOUS | Status: DC
  Administered 2016-10-27: 08:00:00 via INTRAVENOUS

## 2016-10-27 MED ORDER — FLUMAZENIL 0.5 MG/5ML IV SOLN
INTRAVENOUS | Status: AC
  Filled 2016-10-27: qty 5

## 2016-10-27 NOTE — H&P (Signed)
Referring Physician(s): Brunetta Genera  Supervising Physician: Arne Cleveland  Patient Status:  Lindsay Harrison  Chief Complaint:  "I'm getting my port a cath out"  Subjective: Patient familiar to IR service from prior bone marrow biopsy as well as Port-A-Cath placement in January of this year. She has a history of diffuse large B-cell lymphoma and is status post treatment. She is currently in remission and presents today for Port-A-Cath removal. She denies fever, headache, chest pain, dyspnea, cough, abdominal/back pain, nausea, vomiting or bleeding. Past Medical History:  Diagnosis Date  . Anemia    h/o iron deficiency secondary to heavy menses  . Arthritis    left hand index finger  . Diffuse large B cell lymphoma ([place])   . Grief reaction 01/27/2016  . H/O measles    as a child  . History of chicken pox    as a child  . History of PCOS   . Hyperlipidemia, mixed 01/27/2016  . Lymphadenopathy   . Preventative health care 01/27/2016  . Vitamin D deficiency   . Wears glasses    Past Surgical History:  Procedure Laterality Date  . COLONOSCOPY    . IR GENERIC HISTORICAL  05/21/2016   IR FLUORO GUIDE PORT INSERTION RIGHT 05/21/2016 Arne Cleveland, MD [facility]  . IR GENERIC HISTORICAL  05/21/2016   IR US GUIDE VASC ACCESS RIGHT 05/21/2016 Arne Cleveland, MD [facility]  . SKIN BIOPSY Left    face  . THYROGLOSSAL DUCT CYST N/A 04/16/2016   Procedure: THYROGLOSSAL DUCT CYST excision;  Surgeon: [name], MD;  Location: [place];  Service: ENT;  Laterality: N/A;      Allergies: Erythromycin and Other  Medications: Prior to Admission medications   Medication Sig Start Date End Date Taking? Authorizing Provider  Calcium-Magnesium-Vitamin D (CALCIUM MAGNESIUM PO) Take 1 tablet by mouth daily.   Yes [provider]  Cholecalciferol (VITAMIN D) 2000 units CAPS Take 2,000 Units by mouth daily.   Yes [provider]  Omega-3 Fatty Acids (OMEGA-3 PO) Take  2 capsules by mouth daily. Omega 3 1280 mg   Yes [provider]  Vitamin D, Ergocalciferol, (DRISDOL) 50000 units CAPS capsule TAKE 1 CAPSULE BY MOUTH 1 TIME A WEEK 07/19/16  Yes [name], MD  aspirin 81 MG tablet Take 81 mg by mouth daily. Has stopped prior to procedure    [provider]  chlorhexidine (PERIDEX) 0.12 % solution Use as directed 15 mLs in the mouth or throat 2 (two) times daily. 10/18/16   Brunetta Genera, MD  dexamethasone (DECADRON) 4 MG tablet TAKE 3 TABLETS BY MOUTH TWICE DAILY, TAKE 1 DOSE IN THE MORNING AND 1 DOSE IN THE EVENING THE DAY PRIOR TO Rituxan 10/18/16   Brunetta Genera, MD     Vital Signs: BP 138/75 (BP Location: Left Arm)   Pulse 64   Temp 98.8 F (37.1 C) (Oral)   Resp 18   SpO2 98%   Physical Exam  awake, alert. Chest clear to auscultation bilaterally. Clean, intact right chest wall Port-A-Cath. Heart with regular rate and rhythm. Abdomen soft, positive bowel sounds, nontender. No lower extremity edema. Imaging: No results found.  Labs:  CBC:  Recent Labs  08/16/16 0749 09/06/16 0753 10/18/16 0958 10/27/16 0742  WBC 7.3 6.4 4.4 3.2*  HGB 10.4* 11.2* 12.9 13.6  HCT 31.1* 32.5* 38.8 39.9  PLT 366 370 254 272    COAGS:  Recent Labs  05/21/16 0928  INR 1.01  BMP:  Recent Labs  01/27/16 1444  07/26/16 0735 08/16/16 0749 09/06/16 0753 10/18/16 0958  NA 138  < > 136 141 139 140  K 4.6  < > 4.2 4.0 4.1 4.3  CL 102  --   --   --   --   --   CO2 30  < > 23 23 24 27   GLUCOSE 87  < > 171* 147* 131 85  BUN 13  < > 15.3 16.3 17.0 11.5  CALCIUM 9.4  < > 10.8* 10.0 9.9 9.8  CREATININE 0.75  < > 0.7 0.7 0.7 0.7  < > = values in this interval not displayed.  LIVER FUNCTION TESTS:  Recent Labs  07/26/16 0735 08/16/16 0749 09/06/16 0753 10/18/16 0958  BILITOT 0.34 0.41 0.39 0.40  AST 13 15 15 18   ALT 18 22 26 25   ALKPHOS 103 98 87 122  PROT 6.8 6.4 6.4 6.6  ALBUMIN 3.8 3.8 3.9 4.0     Assessment and Plan: Patient with history of diffuse large B-cell lymphoma 05/2016, status post treatment, in remission; presents today for Port-A-Cath removal.Details/risks of procedure, including but not limited to, internal bleeding, infection, injury to adjacent structures, discussed with patient with her understanding and consent.   Electronically Signed: D. Rowe Robert, PA-C 10/27/2016, 8:39 AM   I spent a total of 20 minutes at the the patient's bedside AND on the patient's hospital floor or unit, greater than 50% of which was counseling/coordinating care for Port-A-Cath removal

## 2016-10-27 NOTE — Procedures (Signed)
  R IJ Port Catheter removal  No complication No blood loss. See complete dictation in [facility]  Dillard Cannon MD Main # [phone] Pager  [phone]

## 2016-10-27 NOTE — Discharge Instructions (Signed)
Implanted Port Removal, Care After °Refer to this sheet in the next few weeks. These instructions provide you with information about caring for yourself after your procedure. Your health care provider may also give you more specific instructions. Your treatment has been planned according to current medical practices, but problems sometimes occur. Call your health care provider if you have any problems or questions after your procedure. °What can I expect after the procedure? °After the procedure, it is common to have: °· Soreness or pain near your incision. °· Some swelling or bruising near your incision. ° °Follow these instructions at home: °Medicines °· Take over-the-counter and prescription medicines only as told by your health care provider. °· If you were prescribed an antibiotic medicine, take it as told by your health care provider. Do not stop taking the antibiotic even if you start to feel better. °Bathing °· Do not take baths, swim, or use a hot tub until your health care provider approves. Ask your health care provider if you can take showers. You may only be allowed to take sponge baths for bathing. °Incision care °· Follow instructions from your health care provider about how to take care of your incision. Make sure you: °? Wash your hands with soap and water before you change your bandage (dressing). If soap and water are not available, use hand sanitizer. °? Change your dressing as told by your health care provider. °? Keep your dressing dry. °? Leave stitches (sutures), skin glue, or adhesive strips in place. These skin closures may need to stay in place for 2 weeks or longer. If adhesive strip edges start to loosen and curl up, you may trim the loose edges. Do not remove adhesive strips completely unless your health care provider tells you to do that. °· Check your incision area every day for signs of infection. Check for: °? More redness, swelling, or pain. °? More fluid or  blood. °? Warmth. °? Pus or a bad smell. °Driving °· If you received a sedative, do not drive for 24 hours after the procedure. °· If you did not receive a sedative, ask your health care provider when it is safe to drive. °Activity °· Return to your normal activities as told by your health care provider. Ask your health care provider what activities are safe for you. °· Until your health care provider says it is safe: °? Do not lift anything that is heavier than 10 lb (4.5 kg). °? Do not do activities that involve lifting your arms over your head. °General instructions °· Do not use any tobacco products, such as cigarettes, chewing tobacco, and e-cigarettes. Tobacco can delay healing. If you need help quitting, ask your health care provider. °· Keep all follow-up visits as told by your health care provider. This is important. °Contact a health care provider if: °· You have more redness, swelling, or pain around your incision. °· You have more fluid or blood coming from your incision. °· Your incision feels warm to the touch. °· You have pus or a bad smell coming from your incision. °· You have a fever. °· You have pain that is not relieved by your pain medicine. °Get help right away if: °· You have chest pain. °· You have difficulty breathing. °This information is not intended to replace advice given to you by your health care provider. Make sure you discuss any questions you have with your health care provider. °Document Released: 03/31/2015 Document Revised: 09/25/2015 Document Reviewed: 01/22/2015 °Elsevier Interactive Patient   Education © 2018 [employer] Inc[employer] °Moderate Conscious Sedation, Adult, Care After °These instructions provide you with information about caring for yourself after your procedure. Your health care provider may also give you more specific instructions. Your treatment has been planned according to current medical practices, but problems sometimes occur. Call your health care provider if you have  any problems or questions after your procedure. °What can I expect after the procedure? °After your procedure, it is common: °· To feel sleepy for several hours. °· To feel clumsy and have poor balance for several hours. °· To have poor judgment for several hours. °· To vomit if you eat too soon. ° °Follow these instructions at home: °For at least 24 hours after the procedure: ° °· Do not: °? Participate in activities where you could fall or become injured. °? Drive. °? Use heavy machinery. °? Drink alcohol. °? Take sleeping pills or medicines that cause drowsiness. °? Make important decisions or sign legal documents. °? Take care of children on your own. °· Rest. °Eating and drinking °· Follow the diet recommended by your health care provider. °· If you vomit: °? Drink water, juice, or soup when you can drink without vomiting. °? Make sure you have little or no nausea before eating solid foods. °General instructions °· Have a responsible adult stay with you until you are awake and alert. °· Take over-the-counter and prescription medicines only as told by your health care provider. °· If you smoke, do not smoke without supervision. °· Keep all follow-up visits as told by your health care provider. This is important. °Contact a health care provider if: °· You keep feeling nauseous or you keep vomiting. °· You feel light-headed. °· You develop a rash. °· You have a fever. °Get help right away if: °· You have trouble breathing. °This information is not intended to replace advice given to you by your health care provider. Make sure you discuss any questions you have with your health care provider. °Document Released: 02/07/2013 Document Revised: 09/22/2015 Document Reviewed: 08/09/2015 °Elsevier Interactive Patient Education © 2018 Elsevier Inc. ° °

## 2016-11-04 ENCOUNTER — Ambulatory Visit (HOSPITAL_COMMUNITY): Payer: Medicare Other

## 2016-11-04 ENCOUNTER — Other Ambulatory Visit (HOSPITAL_COMMUNITY): Payer: Medicare Other

## 2016-11-08 ENCOUNTER — Other Ambulatory Visit (HOSPITAL_BASED_OUTPATIENT_CLINIC_OR_DEPARTMENT_OTHER): Payer: Medicare Other

## 2016-11-08 ENCOUNTER — Ambulatory Visit (HOSPITAL_BASED_OUTPATIENT_CLINIC_OR_DEPARTMENT_OTHER): Payer: Medicare Other

## 2016-11-08 VITALS — BP 123/66 | HR 53 | Temp 97.9°F | Resp 18

## 2016-11-08 DIAGNOSIS — C8298 Follicular lymphoma, unspecified, lymph nodes of multiple sites: Secondary | ICD-10-CM

## 2016-11-08 DIAGNOSIS — C8338 Diffuse large B-cell lymphoma, lymph nodes of multiple sites: Secondary | ICD-10-CM | POA: Diagnosis present

## 2016-11-08 DIAGNOSIS — Z5112 Encounter for antineoplastic immunotherapy: Secondary | ICD-10-CM | POA: Diagnosis present

## 2016-11-08 LAB — COMPREHENSIVE METABOLIC PANEL
ALT: 27 U/L (ref 0–55)
AST: 13 U/L (ref 5–34)
Albumin: 4.3 g/dL (ref 3.5–5.0)
Alkaline Phosphatase: 125 U/L (ref 40–150)
Anion Gap: 13 mEq/L — ABNORMAL HIGH (ref 3–11)
BUN: 12.7 mg/dL (ref 7.0–26.0)
CO2: 21 mEq/L — ABNORMAL LOW (ref 22–29)
Calcium: 10.3 mg/dL (ref 8.4–10.4)
Chloride: 103 mEq/L (ref 98–109)
Creatinine: 0.7 mg/dL (ref 0.6–1.1)
EGFR: 82 mL/min/{1.73_m2} — ABNORMAL LOW (ref >90)
Glucose: 149 mg/dl — ABNORMAL HIGH (ref 70–140)
Potassium: 4 mEq/L (ref 3.5–5.1)
Sodium: 138 mEq/L (ref 136–145)
Total Bilirubin: 0.56 mg/dL (ref 0.20–1.20)
Total Protein: 7 g/dL (ref 6.4–8.3)

## 2016-11-08 LAB — CBC & DIFF AND RETIC
BASO%: 0 % (ref 0.0–2.0)
Basophils Absolute: 0 10*3/uL (ref 0.0–0.1)
EOS%: 0 % (ref 0.0–7.0)
Eosinophils Absolute: 0 10*3/uL (ref 0.0–0.5)
HCT: 38.4 % (ref 34.8–46.6)
HGB: 13.1 g/dL (ref 11.6–15.9)
Immature Retic Fract: 1.8 % (ref 1.60–10.00)
LYMPH%: 4.1 % — ABNORMAL LOW (ref 14.0–49.7)
MCH: 31.4 pg (ref 25.1–34.0)
MCHC: 34.1 g/dL (ref 31.5–36.0)
MCV: 92.1 fL (ref 79.5–101.0)
MONO#: 0.5 10*3/uL (ref 0.1–0.9)
MONO%: 6.6 % (ref 0.0–14.0)
NEUT#: 6.3 10*3/uL (ref 1.5–6.5)
NEUT%: 89.3 % — ABNORMAL HIGH (ref 38.4–76.8)
Platelets: 227 10*3/uL (ref 145–400)
RBC: 4.17 10*6/uL (ref 3.70–5.45)
RDW: 12.7 % (ref 11.2–14.5)
Retic %: 0.92 % (ref 0.70–2.10)
Retic Ct Abs: 38.36 10*3/uL (ref 33.70–90.70)
WBC: 7 10*3/uL (ref 3.9–10.3)
lymph#: 0.3 10*3/uL — ABNORMAL LOW (ref 0.9–3.3)
nRBC: 0 % (ref 0–0)

## 2016-11-08 LAB — LACTATE DEHYDROGENASE: LDH: 197 U/L (ref 125–245)

## 2016-11-08 MED ORDER — DIPHENHYDRAMINE HCL 25 MG PO CAPS
50.0000 mg | ORAL_CAPSULE | Freq: Once | ORAL | Status: AC
Start: 2016-11-08 — End: 2016-11-08
  Administered 2016-11-08: 50 mg via ORAL

## 2016-11-08 MED ORDER — SODIUM CHLORIDE 0.9 % IV SOLN
375.0000 mg/m2 | Freq: Once | INTRAVENOUS | Status: AC
  Administered 2016-11-08: 700 mg via INTRAVENOUS
  Filled 2016-11-08: qty 50

## 2016-11-08 MED ORDER — DEXAMETHASONE SODIUM PHOSPHATE 100 MG/10ML IJ SOLN
12.0000 mg | Freq: Once | INTRAMUSCULAR | Status: AC
  Administered 2016-11-08: 12 mg via INTRAVENOUS
  Filled 2016-11-08: qty 1.2

## 2016-11-08 MED ORDER — ACETAMINOPHEN 325 MG PO TABS
ORAL_TABLET | ORAL | Status: AC
  Filled 2016-11-08: qty 1

## 2016-11-08 MED ORDER — ACETAMINOPHEN 325 MG PO TABS
650.0000 mg | ORAL_TABLET | Freq: Once | ORAL | Status: AC
  Administered 2016-11-08: 650 mg via ORAL

## 2016-11-08 MED ORDER — DIPHENHYDRAMINE HCL 25 MG PO CAPS
ORAL_CAPSULE | ORAL | Status: AC
  Filled 2016-11-08: qty 2

## 2016-11-08 MED ORDER — SODIUM CHLORIDE 0.9 % IV SOLN
Freq: Once | INTRAVENOUS | Status: AC
  Administered 2016-11-08: 09:00:00 via INTRAVENOUS

## 2016-11-08 NOTE — Patient Instructions (Signed)
[facility] Discharge Instructions for Patients Receiving Chemotherapy  Today you received the following chemotherapy agents: Rituxan   To help prevent nausea and vomiting after your treatment, we encourage you to take your nausea medication as directed.    If you develop nausea and vomiting that is not controlled by your nausea medication, call the clinic.   BELOW ARE SYMPTOMS THAT SHOULD BE REPORTED IMMEDIATELY:  *FEVER GREATER THAN 100.5 F  *CHILLS WITH OR WITHOUT FEVER  NAUSEA AND VOMITING THAT IS NOT CONTROLLED WITH YOUR NAUSEA MEDICATION  *UNUSUAL SHORTNESS OF BREATH  *UNUSUAL BRUISING OR BLEEDING  TENDERNESS IN MOUTH AND THROAT WITH OR WITHOUT PRESENCE OF ULCERS  *URINARY PROBLEMS  *BOWEL PROBLEMS  UNUSUAL RASH Items with * indicate a potential emergency and should be followed up as soon as possible.  Feel free to call the clinic you have any questions or concerns. The clinic phone number is [phone].  Please show the CHEMO ALERT CARD at check-in to the Emergency Department and triage nurse.   

## 2016-11-22 ENCOUNTER — Other Ambulatory Visit: Payer: Self-pay

## 2016-12-19 ENCOUNTER — Encounter: Payer: Self-pay | Admitting: Hematology

## 2016-12-27 DIAGNOSIS — Z1231 Encounter for screening mammogram for malignant neoplasm of breast: Secondary | ICD-10-CM | POA: Diagnosis not present

## 2016-12-27 LAB — HM MAMMOGRAPHY

## 2017-01-04 NOTE — Progress Notes (Signed)
[name]    HEMATOLOGY/ONCOLOGY CLINIC NOTE  Date of Service: 01/10/17  Patient Care Team: [name], MD as PCP - General (Family Medicine) [name] M.D. (ENT)  CHIEF COMPLAINTS/PURPOSE OF CONSULTATION:  f/u Diffuse large B-cell lymphoma   HISTORY OF PRESENTING ILLNESS:   Please see previous note for details of initial presentation.  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 She has completed her 6 cycles of R CHOP and is currently in complete remission. She presents today for her second round of maintenance Rituxan. Pt is accompanied by her friend today and reports that she is doing well overall. She states that she went to [facility] this past summer and enjoyed her time.   Pt reports that her husband, Randall Hiss died 3 weeks ago from [place] that he had for 8.5 years and sepsis and she notes that she has had a hard time dealing with that. She notes that her husband, Randall Hiss also had a large kidney stone and kidney infection that providers informed the patient and her husband that it wouldn't be able to be removed due to her husbands condition. She notes that her husband was able to weigh on the decision to have comfort care versus kidney stone removal.  She states that she has been able to start riding her bicycle more recently. She reports that she recently stopped going to the [facility] due to the influx of individuals and germs. Pt voices concern for if she would be able to return to the [facility] soon or waiting until March. She reports that she used to go to the [employer] at 7:30am where there weren't many people. 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 Pt notes that the neuropathy slightly affects her sleeping and causes her to take 30 minutes to go to sleep. Pt reports that she only takes 1 ativan per month. She states that she went to the [facility] this past  weekend. 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 She notes that she recently went to the dentist and had a mammogram with no issues noted. Pt states that she has an appointment with her PCP in October and voices concern for if she should get her flu vaccine.  On review of systems, she denies fever, chills, night sweats. She reports insomnia following her last dexamethasone treatment. Denies abdominal pain. Denies pain. [name]    MEDICAL HISTORY:  Past Medical History:  Diagnosis Date  . Anemia    h/o iron deficiency secondary to heavy menses  . Arthritis    left hand index finger  . Diffuse large B cell lymphoma ([place])   . Grief reaction 01/27/2016  . H/O measles    as a child  . History of chicken pox    as a child  . History of PCOS   . Hyperlipidemia, mixed 01/27/2016  . Lymphadenopathy   . Preventative health care 01/27/2016  . Vitamin D deficiency   . Wears glasses     SURGICAL HISTORY: Past Surgical History:  Procedure Laterality Date  . COLONOSCOPY    . IR GENERIC HISTORICAL  05/21/2016   IR FLUORO GUIDE PORT INSERTION RIGHT 05/21/2016 Arne Cleveland, MD [facility]  . IR GENERIC HISTORICAL  05/21/2016   IR US GUIDE VASC ACCESS RIGHT 05/21/2016 Arne Cleveland, MD [facility]  . IR REMOVAL TUN ACCESS W/  PORT W/O FL MOD SED  10/27/2016  . SKIN BIOPSY Left    face  . THYROGLOSSAL DUCT CYST N/A 04/16/2016   Procedure: THYROGLOSSAL DUCT CYST excision;  Surgeon: [name], MD;  Location: [facility] OR;  Service: ENT;  Laterality: N/A;    SOCIAL HISTORY: Social History   Social History  . Marital status: Married    Spouse name: N/A  . Number of children: N/A  . Years of education: N/A   Occupational History  . Psychotherapist    Social History Main Topics  . Smoking status: Never Smoker  . Smokeless tobacco: Never Used  . Alcohol use 1.8 - 3.6 oz/week    3 - 6 Standard drinks or equivalent per week     Comment: 5 glasses of wine a week.  .  Drug use: No  . Sexual activity: Yes    Partners: Male    Birth control/ protection: None   Other Topics Concern  . Not on file   Social History Narrative   Married. Education: [employer]. Exercise: walking, yoga, and bicycling daily for 1-2 hours. Lives alone, works as Management consultant    FAMILY HISTORY: Family History  Problem Relation Age of Onset  . Arthritis Mother        rheumatoid  . Heart disease Father        CHF  . COPD Sister        Emphysema, h/o cigarettes  . Other Sister        pituatary tumor  . Arthritis Sister   . Obesity Sister   . Cancer Paternal Grandfather        cancer  . Stroke Maternal Aunt     ALLERGIES:  is allergic to erythromycin and other.  MEDICATIONS:  Current Outpatient Prescriptions  Medication Sig Dispense Refill  . aspirin 81 MG tablet Take 81 mg by mouth daily. Has stopped prior to procedure    . Calcium-Magnesium-Vitamin D (CALCIUM MAGNESIUM PO) Take 1 tablet by mouth daily.    . chlorhexidine (PERIDEX) 0.12 % solution Use as directed 15 mLs in the mouth or throat 2 (two) times daily. 473 mL 0  . Cholecalciferol (VITAMIN D) 2000 units CAPS Take 2,000 Units by mouth daily.    [name] dexamethasone (DECADRON) 4 MG tablet TAKE 3 TABLETS BY MOUTH TWICE DAILY, TAKE 1 DOSE IN THE MORNING AND 1 DOSE IN THE EVENING THE DAY PRIOR TO Rituxan 12 tablet 6  . Omega-3 Fatty Acids (OMEGA-3 PO) Take 2 capsules by mouth daily. Omega 3 1280 mg    . Vitamin D, Ergocalciferol, (DRISDOL) 50000 units CAPS capsule TAKE 1 CAPSULE BY MOUTH 1 TIME A WEEK 4 capsule 0   No current facility-administered medications for this visit.     REVIEW OF SYSTEMS:    10 Point review of Systems was done is negative except as noted above.  PHYSICAL EXAMINATION:  ECOG PERFORMANCE STATUS: 0 - Asymptomatic  Vitals:   01/10/17 0837  BP: (!) 136/59  Pulse: 61  Resp: 20  Temp: 98.1 F (36.7 C)  SpO2: 100%   Filed Weights   01/10/17 0837  Weight: 165 lb 9.6 oz (75.1 kg)    .Body mass index is 25.18 kg/m.  GENERAL:alert, in no acute distress and comfortable SKIN: skin color, texture, turgor are normal, no rashes or significant lesions EYES: normal, conjunctiva are pink and non-injected, sclera clear OROPHARYNX:no exudate, no erythema and lips, buccal mucosa, and tongue normal  NECK: supple, no JVD, thyroid normal size, non-tender, without  nodularity LYMPH:  no palpable lymphadenopathy in the cervical, axillary or inguinal LUNGS: clear to auscultation with normal respiratory effort HEART: regular rate & rhythm,  no murmurs and no lower extremity edema ABDOMEN: abdomen soft, non-tender, normoactive bowel sounds  Musculoskeletal: no cyanosis of digits and no clubbing  PSYCH: alert & oriented x 3 with fluent speech NEURO: no focal motor/sensory deficits  LABORATORY DATA:  I have reviewed the data as listed  . CBC Latest Ref Rng & Units 01/10/2017 11/08/2016 10/27/2016  WBC 3.9 - 10.3 10e3/uL 8.4 7.0 3.2(L)  Hemoglobin 11.6 - 15.9 g/dL 12.9 13.1 13.6  Hematocrit 34.8 - 46.6 % 38.2 38.4 39.9  Platelets 145 - 400 10e3/uL 245 227 272   . CBC    Component Value Date/Time   WBC 8.4 01/10/2017 0746   WBC 3.2 (L) 10/27/2016 0742   RBC 4.15 01/10/2017 0746   RBC 4.41 10/27/2016 0742   HGB 12.9 01/10/2017 0746   HCT 38.2 01/10/2017 0746   PLT 245 01/10/2017 0746   MCV 92.0 01/10/2017 0746   MCH 31.1 01/10/2017 0746   MCH 30.8 10/27/2016 0742   MCHC 33.8 01/10/2017 0746   MCHC 34.1 10/27/2016 0742   RDW 13.3 01/10/2017 0746   LYMPHSABS 0.3 (L) 01/10/2017 0746   MONOABS 0.3 01/10/2017 0746   EOSABS 0.0 01/10/2017 0746   BASOSABS 0.0 01/10/2017 0746    . CMP Latest Ref Rng & Units 01/10/2017 11/08/2016 10/18/2016  Glucose 70 - 140 mg/dl 151(H) 149(H) 85  BUN 7.0 - 26.0 mg/dL 13.5 12.7 11.5  Creatinine 0.6 - 1.1 mg/dL 0.7 0.7 0.7  Sodium 136 - 145 mEq/L 138 138 140  Potassium 3.5 - 5.1 mEq/L 4.2 4.0 4.3  Chloride 96 - 112 mEq/L - - -  CO2 22 - 29  mEq/L 21(L) 21(L) 27  Calcium 8.4 - 10.4 mg/dL 10.4 10.3 9.8  Total Protein 6.4 - 8.3 g/dL 7.0 7.0 6.6  Total Bilirubin 0.20 - 1.20 mg/dL 0.40 0.56 0.40  Alkaline Phos 40 - 150 U/L 148 125 122  AST 5 - 34 U/L 16 13 18   ALT 0 - 55 U/L 28 27 25    Component     Latest Ref Rng & Units 05/07/2016  Hepatitis B Surface Ag     Negative Negative  Hep B Core Ab, Tot     Negative Negative  Hep C Virus Ab     0.0 - 0.9 s/co ratio <0.1   . Lab Results  Component Value Date   LDH 188 01/10/2017         RADIOGRAPHIC STUDIES:   No results found.  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  Initial PET/CT showed Scattered small mildly hypermetabolic lymph nodes within the neck, left hilum, left axilla and left groin, potentially related the patient's lymphoma. Focal hypermetabolic activity within the left sternal manubrium and left iliac bone, also potentially related to the patient's lymphoma. No evidence of solid visceral organ involvement.  CT guided bone marrow biopsy done -- no evidence of lymphoma involving Bone marrow. Normal LDH. No constitutional symptoms. Patient has no significant medical comorbidities at baseline.  ECHO with nl EF as expected.  Patient has completed 6 cycles of R CHOP  PET/CT s/p 6 cycles of R-CHOP shows complete metabolic response   2) Rituxan hypersensitivity (mild grade 1 hives on the back -  resolved with solumedrol and antihistamines. No issues with dexamethasone pretreatment prior to cycle 2. -Patient will continue dexamethasone premedication to reduce risk of Rituxan hypersensitivity.  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   PLAN -Labs are stable. Patient has no other issues with infections are  prohibitive toxicities from chemotherapy. -She will proceed with her second round of maintenance Rituxan today -Counseled on infection prevention strategies.  -Will give the patient referral to [facility] for PT to work on endurance and stamina as well as to help with complementary management of chemotherapy-related neuropathy. -Patient will have her flu vaccine administered today while in the clinic.   -Continue on Schedule for maintenance Rituxan q 60 days wityh dexamethasone pre-medications. -labs q 60 days with each treatment.   RTC with Dr Irene Limbo with labs with C3 of maintenance Rituxan in 60 days. . Orders Placed This Encounter  Procedures  . Flu vaccine > 3yo with preservative IM (Fluvirin Influenza Split)  . CBC & Diff and Retic    Standing Status:   Future    Standing Expiration Date:   01/10/2018  . Comprehensive metabolic panel    Standing Status:   Future    Standing Expiration Date:   01/10/2018  . Lactate dehydrogenase    Standing Status:   Future    Standing Expiration Date:   01/10/2018    All of the patients questions were answered with apparent satisfaction. The patient knows to call the clinic with any problems, questions or concerns.  I spent 20 minutes counseling the patient face to face. The total time spent in the appointment was 25 minutes and more than 50% was on counseling and direct patient cares.    Sullivan Lone MD [place] AAHIVMS [facility] Hematology/Oncology Physician [place]  (Office):       [phone] (Work cell):  [phone] (Fax):           [phone]  This document serves as a record of services personally performed by Sullivan Lone, MD. It was created on her behalf by Steva Colder, a trained medical scribe. The creation of this record is based on the scribe's personal observations and the provider's statements to them. This document has been checked and approved by the attending provider.

## 2017-01-07 ENCOUNTER — Encounter: Payer: Self-pay | Admitting: Family Medicine

## 2017-01-09 ENCOUNTER — Encounter: Payer: Self-pay | Admitting: Hematology

## 2017-01-10 ENCOUNTER — Ambulatory Visit (HOSPITAL_BASED_OUTPATIENT_CLINIC_OR_DEPARTMENT_OTHER): Payer: Medicare Other

## 2017-01-10 ENCOUNTER — Ambulatory Visit (HOSPITAL_BASED_OUTPATIENT_CLINIC_OR_DEPARTMENT_OTHER): Payer: Medicare Other | Admitting: Hematology

## 2017-01-10 ENCOUNTER — Other Ambulatory Visit (HOSPITAL_BASED_OUTPATIENT_CLINIC_OR_DEPARTMENT_OTHER): Payer: Medicare Other

## 2017-01-10 ENCOUNTER — Encounter: Payer: Self-pay | Admitting: Hematology

## 2017-01-10 VITALS — BP 118/57 | HR 48 | Temp 98.1°F | Resp 18

## 2017-01-10 VITALS — BP 136/59 | HR 61 | Temp 98.1°F | Resp 20 | Ht 68.0 in | Wt 165.6 lb

## 2017-01-10 DIAGNOSIS — C8298 Follicular lymphoma, unspecified, lymph nodes of multiple sites: Secondary | ICD-10-CM | POA: Diagnosis not present

## 2017-01-10 DIAGNOSIS — C8338 Diffuse large B-cell lymphoma, lymph nodes of multiple sites: Secondary | ICD-10-CM | POA: Diagnosis not present

## 2017-01-10 DIAGNOSIS — R5383 Other fatigue: Secondary | ICD-10-CM

## 2017-01-10 DIAGNOSIS — Z23 Encounter for immunization: Secondary | ICD-10-CM

## 2017-01-10 DIAGNOSIS — G629 Polyneuropathy, unspecified: Secondary | ICD-10-CM | POA: Diagnosis not present

## 2017-01-10 LAB — CBC & DIFF AND RETIC
BASO%: 0 % (ref 0.0–2.0)
Basophils Absolute: 0 10*3/uL (ref 0.0–0.1)
EOS%: 0 % (ref 0.0–7.0)
Eosinophils Absolute: 0 10*3/uL (ref 0.0–0.5)
HCT: 38.2 % (ref 34.8–46.6)
HGB: 12.9 g/dL (ref 11.6–15.9)
Immature Retic Fract: 0.6 % — ABNORMAL LOW (ref 1.60–10.00)
LYMPH%: 3.4 % — ABNORMAL LOW (ref 14.0–49.7)
MCH: 31.1 pg (ref 25.1–34.0)
MCHC: 33.8 g/dL (ref 31.5–36.0)
MCV: 92 fL (ref 79.5–101.0)
MONO#: 0.3 10*3/uL (ref 0.1–0.9)
MONO%: 3.7 % (ref 0.0–14.0)
NEUT#: 7.8 10*3/uL — ABNORMAL HIGH (ref 1.5–6.5)
NEUT%: 92.9 % — ABNORMAL HIGH (ref 38.4–76.8)
Platelets: 245 10*3/uL (ref 145–400)
RBC: 4.15 10*6/uL (ref 3.70–5.45)
RDW: 13.3 % (ref 11.2–14.5)
Retic %: 1.32 % (ref 0.70–2.10)
Retic Ct Abs: 54.78 10*3/uL (ref 33.70–90.70)
WBC: 8.4 10*3/uL (ref 3.9–10.3)
lymph#: 0.3 10*3/uL — ABNORMAL LOW (ref 0.9–3.3)

## 2017-01-10 LAB — COMPREHENSIVE METABOLIC PANEL
ALT: 28 U/L (ref 0–55)
AST: 16 U/L (ref 5–34)
Albumin: 4.3 g/dL (ref 3.5–5.0)
Alkaline Phosphatase: 148 U/L (ref 40–150)
Anion Gap: 12 mEq/L — ABNORMAL HIGH (ref 3–11)
BUN: 13.5 mg/dL (ref 7.0–26.0)
CO2: 21 mEq/L — ABNORMAL LOW (ref 22–29)
Calcium: 10.4 mg/dL (ref 8.4–10.4)
Chloride: 105 mEq/L (ref 98–109)
Creatinine: 0.7 mg/dL (ref 0.6–1.1)
EGFR: 87 mL/min/{1.73_m2} — ABNORMAL LOW (ref >90)
Glucose: 151 mg/dl — ABNORMAL HIGH (ref 70–140)
Potassium: 4.2 mEq/L (ref 3.5–5.1)
Sodium: 138 mEq/L (ref 136–145)
Total Bilirubin: 0.4 mg/dL (ref 0.20–1.20)
Total Protein: 7 g/dL (ref 6.4–8.3)

## 2017-01-10 LAB — LACTATE DEHYDROGENASE: LDH: 188 U/L (ref 125–245)

## 2017-01-10 MED ORDER — ACETAMINOPHEN 325 MG PO TABS
650.0000 mg | ORAL_TABLET | Freq: Once | ORAL | Status: AC
  Administered 2017-01-10: 650 mg via ORAL

## 2017-01-10 MED ORDER — INFLUENZA VAC SPLIT QUAD 0.5 ML IM SUSY
0.5000 mL | PREFILLED_SYRINGE | Freq: Once | INTRAMUSCULAR | Status: AC
  Administered 2017-01-10: 0.5 mL via INTRAMUSCULAR
  Filled 2017-01-10: qty 0.5

## 2017-01-10 MED ORDER — SODIUM CHLORIDE 0.9 % IV SOLN
Freq: Once | INTRAVENOUS | Status: AC
  Administered 2017-01-10: 10:00:00 via INTRAVENOUS

## 2017-01-10 MED ORDER — DIPHENHYDRAMINE HCL 25 MG PO CAPS
50.0000 mg | ORAL_CAPSULE | Freq: Once | ORAL | Status: AC
  Administered 2017-01-10: 50 mg via ORAL

## 2017-01-10 MED ORDER — DIPHENHYDRAMINE HCL 25 MG PO CAPS
ORAL_CAPSULE | ORAL | Status: AC
  Filled 2017-01-10: qty 2

## 2017-01-10 MED ORDER — SODIUM CHLORIDE 0.9 % IV SOLN
12.0000 mg | Freq: Once | INTRAVENOUS | Status: AC
  Administered 2017-01-10: 12 mg via INTRAVENOUS
  Filled 2017-01-10: qty 1.2

## 2017-01-10 MED ORDER — ACETAMINOPHEN 325 MG PO TABS
ORAL_TABLET | ORAL | Status: AC
  Filled 2017-01-10: qty 2

## 2017-01-10 MED ORDER — SODIUM CHLORIDE 0.9 % IV SOLN
375.0000 mg/m2 | Freq: Once | INTRAVENOUS | Status: AC
  Administered 2017-01-10: 700 mg via INTRAVENOUS
  Filled 2017-01-10: qty 50

## 2017-01-10 NOTE — Patient Instructions (Addendum)
[facility] Discharge Instructions for Patients Receiving Chemotherapy  Today you received the following chemotherapy agents - Rituxan  If you develop nausea and vomiting that is not controlled by your nausea medication, call the clinic.   BELOW ARE SYMPTOMS THAT SHOULD BE REPORTED IMMEDIATELY:  *FEVER GREATER THAN 100.5 F  *CHILLS WITH OR WITHOUT FEVER  NAUSEA AND VOMITING THAT IS NOT CONTROLLED WITH YOUR NAUSEA MEDICATION  *UNUSUAL SHORTNESS OF BREATH  *UNUSUAL BRUISING OR BLEEDING  TENDERNESS IN MOUTH AND THROAT WITH OR WITHOUT PRESENCE OF ULCERS  *URINARY PROBLEMS  *BOWEL PROBLEMS  UNUSUAL RASH Items with * indicate a potential emergency and should be followed up as soon as possible.  Feel free to call the clinic should you have any questions or concerns. The clinic phone number is [phone].  Please show the CHEMO ALERT CARD at check-in to the Emergency Department and triage nurse.   

## 2017-01-11 ENCOUNTER — Telehealth: Payer: Self-pay

## 2017-01-11 ENCOUNTER — Encounter: Payer: Self-pay | Admitting: Hematology

## 2017-01-11 NOTE — Addendum Note (Signed)
Addended by: Sullivan Lone on: 01/11/2017 03:34 PM   Modules accepted: Orders

## 2017-01-11 NOTE — Telephone Encounter (Signed)
Called to let pt know referral placed for physical therapy. Pt to wait for call. Pt already called by outpatient rehab center and appt set up for tomorrow.

## 2017-01-12 ENCOUNTER — Ambulatory Visit: Payer: Medicare Other | Attending: Hematology | Admitting: Physical Therapy

## 2017-01-12 DIAGNOSIS — M6281 Muscle weakness (generalized): Secondary | ICD-10-CM | POA: Diagnosis not present

## 2017-01-12 DIAGNOSIS — M25511 Pain in right shoulder: Secondary | ICD-10-CM | POA: Diagnosis not present

## 2017-01-12 DIAGNOSIS — M25611 Stiffness of right shoulder, not elsewhere classified: Secondary | ICD-10-CM | POA: Diagnosis not present

## 2017-01-12 DIAGNOSIS — R262 Difficulty in walking, not elsewhere classified: Secondary | ICD-10-CM | POA: Insufficient documentation

## 2017-01-12 DIAGNOSIS — R293 Abnormal posture: Secondary | ICD-10-CM | POA: Insufficient documentation

## 2017-01-12 DIAGNOSIS — R209 Unspecified disturbances of skin sensation: Secondary | ICD-10-CM | POA: Diagnosis not present

## 2017-01-12 NOTE — Therapy (Signed)
[place], [place], [address] Phone: [phone]   Fax:  [phone]  Physical Therapy Evaluation  Patient Details  Name: Lindsay Harrison MRN: [id number] Date of Birth: 22-Jul-1945 Referring Provider: Dr. Irene Limbo   Encounter Date: 01/12/2017      PT End of Session - 01/12/17 1544    Visit Number 1   Number of Visits 17   Date for PT Re-Evaluation 03/14/17   PT Start Time 0845   PT Stop Time 0930   PT Time Calculation (min) 45 min   Activity Tolerance Patient tolerated treatment well   Behavior During Therapy [facility] for tasks assessed/performed      Past Medical History:  Diagnosis Date  . Anemia    h/o iron deficiency secondary to heavy menses  . Arthritis    left hand index finger  . Diffuse large B cell lymphoma ([place])   . Grief reaction 01/27/2016  . H/O measles    as a child  . History of chicken pox    as a child  . History of PCOS   . Hyperlipidemia, mixed 01/27/2016  . Lymphadenopathy   . Preventative health care 01/27/2016  . Vitamin D deficiency   . Wears glasses     Past Surgical History:  Procedure Laterality Date  . COLONOSCOPY    . IR GENERIC HISTORICAL  05/21/2016   IR FLUORO GUIDE PORT INSERTION RIGHT 05/21/2016 Arne Cleveland, MD [facility]  . IR GENERIC HISTORICAL  05/21/2016   IR US GUIDE VASC ACCESS RIGHT 05/21/2016 Arne Cleveland, MD [facility]  . IR REMOVAL TUN ACCESS W/ PORT W/O FL MOD SED  10/27/2016  . SKIN BIOPSY Left    face  . THYROGLOSSAL DUCT CYST N/A 04/16/2016   Procedure: THYROGLOSSAL DUCT CYST excision;  Surgeon: [name], MD;  Location: [facility];  Service: ENT;  Laterality: N/A;    There were no vitals filed for this visit.       Subjective Assessment - 01/12/17 0906    Subjective Pt reports pain in right shoulder and neuropathy in balls of feet.  this sometimes prevents her from falling to sleep. Her usual walking route takes a lot more time,  feels like she has decreased general strength, feels tighness from port in riht chext    Pertinent History lymphoma diagnosed in Dember 2017, she had removeal of grown under chin, had  chemotherapy with port inserted( Jan)  and removed ( July)  will have ongoing chemo every 60 day for 2 years    Currently in Pain? Yes   Pain Score 3    Pain Location Shoulder   Pain Orientation Right   Pain Descriptors / Indicators Aching   Pain Type Chronic pain   Pain Radiating Towards uppr back, trap ares    Pain Onset More than a month ago   Pain Frequency Constant   Aggravating Factors  movement, can't sleep on right side    Pain Relieving Factors tries to get up and walk around    Effect of Pain on Daily Activities Her goals are to get rid of her shoulder pain, increase her general body strength and endurance and stretch the front of her chest            [facility] PT Assessment - 01/12/17 0001      Assessment   Medical Diagnosis lymphoma    Referring Provider Dr. Irene Limbo    Onset Date/Surgical Date 04/29/16   Hand Dominance Right  Precautions   Precautions Other (comment)   Precaution Comments immunuocompromised      Restrictions   Weight Bearing Restrictions No     Balance Screen   Has the patient fallen in the past 6 months No   Has the patient had a decrease in activity level because of a fear of falling?  No   Is the patient reluctant to leave their home because of a fear of falling?  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    Leisure walks 5 days a week for about an hour  enjoys riding a bike, but has not been able to      [employer]   Overall Cognitive Status Within Functional Limits for tasks assessed  pt grieving recent loss of husband       Observation/Other Assessments   Observations pt walks in without a device, lost her balance when stepping around a chair in the waiting room and took a large step to recover    Skin Integrity no open areas      Sensation   Additional Comments Pt reports numbness in the balls of her feet      Posture/Postural Control   Posture/Postural Control Postural limitations   Postural Limitations Rounded Shoulders;Forward head     AROM   Right Shoulder Flexion 140 Degrees   Right Shoulder ABduction 130 Degrees   Right Shoulder External Rotation 85 Degrees  painful at end range    Left Shoulder Flexion 140 Degrees   Left Shoulder ABduction 145 Degrees   Left Shoulder External Rotation 90 Degrees     Strength   Overall Strength Comments painful with resisted right shoulder flexion and abuction with strength in these movments 3-/5    Right Hand Grip (lbs) 10/7/5  very  limted by pain in shoulder   Left Hand Grip (lbs) 20/23/23     Palpation   Palpation comment tender tightness in right upper back, neck and shoulder  decreased scar mobility around port scar      Ambulation/Gait   Gait velocity 1.38 m/sec     Balance   Balance comment MiniBest 22/28 with most difficulty with walking with head turns, standing on foam surface with eyes closed      Timed Up and Go Test   Normal TUG (seconds) 13.46   Cognitive TUG (seconds) 18            Objective measurements completed on examination: See above findings.                     Short Term Clinic Goals - 01/12/17 1606      CC Short Term Goal  #1   Title Pt will report the pain in her right shoulder has decreased by 50% so that she is able to perform her daily tasks easier   Time 4   Period Weeks   Status New     CC Short Term Goal  #2   Title Pt will be independent in home program for posture and shoulder strength and ROM    Time 4   Period Weeks   Status New     CC Short Term Goal  #3   Title Pt will be  independent in home program for posture and shoulder strength and ROM    Time 4   Period Weeks   Status New             Long Term Clinic Goals - 01/12/17 1602      CC Long Term Goal  #1   Title Pt will have increase in miniBEST score to 25/28    Baseline 22/28   Time 8   Period Weeks   Status New     CC Long Term Goal  #2   Title Pt will be independent in home program for balance and general strength   Time 8   Period Weeks   Status New     CC Long Term Goal  #3   Title Pt will increase gait velocity to 1.5 m/sec   Baseline 1.37 m/sec   Time 8   Period Weeks   Status New     CC Long Term Goal  #4   Title Pt will report she is able to return to her community exercise activities    Time 8   Period Weeks   Status New             Plan - 01/12/17 1546    Clinical Impression Statement Pt is a 71 yo female undergoing treatment for lymphoma.  She is experiencing fatigue and neuropathys symptoms along with pain and weakness as a result of the pain in her left shoulder. Her goals are to get relief from this pain and improvement in her general strength so she can return to her community exercise  Evaluation today revealed loss of strength in right arm limited by pain, decreased balance especially when on unstable surface with eyes closed and when turning her head while walking or counting backwards while walking. She has decreased grip strength indicative of decreased generalized strength While her gait speed indicated she would be able to safely cross a street she feels it is much slower than her norm.    History and Personal Factors relevant to plan of care: lives alone, has not been able to do usual exercise or activities due to chemo treatment and shoulder pain recently lost her husband    Clinical Presentation Evolving   Clinical Presentation due to: Ongoing chemo    Clinical Decision Making Moderate   Rehab Potential Good   Clinical Impairments Affecting Rehab  Potential CIPN    PT Frequency 2x / week   PT Duration 8 weeks   PT Treatment/Interventions ADLs/Self Care Home Management;Patient/family education;Gait training;Functional mobility training;Manual techniques;Therapeutic activities;Electrical Stimulation;Therapeutic exercise;Balance training;Neuromuscular re-education;Passive range of motion;Scar mobilization   PT Next Visit Plan Begin with moist heat pack /soft tissue work to upper back and right shoulder, teach meeks compression and advance to interscapular strength and ROM to shoulders Teach home porgram for same Add stationary bike  Work on closed chain ankle and LE strenth and progress to working on [employer] and balance activites with eyes closed    Consulted and Agree with Plan of Care Patient      Patient will benefit from skilled therapeutic intervention in order to improve the following deficits and impairments:  Decreased endurance, Abnormal gait, Impaired sensation, Decreased scar mobility, Decreased knowledge of precautions, Decreased activity tolerance, Decreased strength, Increased fascial restricitons, Impaired UE functional use, Pain, Increased muscle spasms, Difficulty walking, Decreased balance, Decreased range of motion, Impaired perceived functional ability, Postural dysfunction  Visit Diagnosis: Abnormal posture  Acute pain of right shoulder  Stiffness of  right shoulder, not elsewhere classified  Unspecified disturbances of skin sensation  Muscle weakness (generalized)  Difficulty in walking      G-Codes - 08-Feb-2017 1611    Functional Assessment Tool Used (Outpatient Only) clinical judgement   Functional Limitation Carrying, moving and handling objects   Carrying, Moving and Handling Objects Current Status (R0076) At least 40 percent but less than 60 percent impaired, limited or restricted   Carrying, Moving and Handling Objects Goal Status (A2633) At least 20 percent but less than 40 percent impaired, limited or  restricted       Problem List Patient Active Problem List   Diagnosis Date Noted  . 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 NHL (non-Hodgkin's lymphoma) ([place]) 10/18/2016  . Hypersensitivity reaction 05/24/2016  . Hyperlipidemia, mixed 01/27/2016  . Grief reaction 01/27/2016  . Preventative health care 01/27/2016  . Anemia 01/27/2016  . History of colonic polyps 01/27/2016  . Actinic keratoses 01/27/2016  . History of chicken pox   . Vitamin D deficiency   . Diffuse large B cell lymphoma ([place])   . History of PCOS    Donato Heinz. Owens Shark PT  Norwood Levo February 08, 2017, 4:13 PM  [place], [place], [address] Phone: [phone]   Fax:  [phone]  Name: Dustie Brittle MRN: [id number] Date of Birth: August 20, 1945

## 2017-01-17 ENCOUNTER — Ambulatory Visit: Payer: Medicare Other | Admitting: Physical Therapy

## 2017-01-17 DIAGNOSIS — M25511 Pain in right shoulder: Secondary | ICD-10-CM

## 2017-01-17 DIAGNOSIS — R293 Abnormal posture: Secondary | ICD-10-CM

## 2017-01-17 DIAGNOSIS — M25611 Stiffness of right shoulder, not elsewhere classified: Secondary | ICD-10-CM

## 2017-01-17 DIAGNOSIS — M6281 Muscle weakness (generalized): Secondary | ICD-10-CM | POA: Diagnosis not present

## 2017-01-17 DIAGNOSIS — R209 Unspecified disturbances of skin sensation: Secondary | ICD-10-CM | POA: Diagnosis not present

## 2017-01-17 DIAGNOSIS — R262 Difficulty in walking, not elsewhere classified: Secondary | ICD-10-CM | POA: Diagnosis not present

## 2017-01-17 NOTE — Patient Instructions (Signed)

## 2017-01-17 NOTE — Therapy (Addendum)
[place], [place], [address] Phone: [phone]   Fax:  [phone]  Physical Therapy Treatment  Patient Details  Name: Lindsay Harrison MRN: [id number] Date of Birth: October 02, 1945 Referring Provider: Dr. Irene Limbo   Encounter Date: 01/17/2017      PT End of Session - 01/17/17 1352    Visit Number 2   Number of Visits 17   Date for PT Re-Evaluation 03/14/17   PT Start Time 1301   PT Stop Time 1404  3 units is correct: had moist heat for 15 minutes   PT Time Calculation (min) 63 min   Activity Tolerance Patient tolerated treatment well   Behavior During Therapy [facility] for tasks assessed/performed      Past Medical History:  Diagnosis Date  . Anemia    h/o iron deficiency secondary to heavy menses  . Arthritis    left hand index finger  . Diffuse large B cell lymphoma ([place])   . Grief reaction 01/27/2016  . H/O measles    as a child  . History of chicken pox    as a child  . History of PCOS   . Hyperlipidemia, mixed 01/27/2016  . Lymphadenopathy   . Preventative health care 01/27/2016  . Vitamin D deficiency   . Wears glasses     Past Surgical History:  Procedure Laterality Date  . COLONOSCOPY    . IR GENERIC HISTORICAL  05/21/2016   IR FLUORO GUIDE PORT INSERTION RIGHT 05/21/2016 Arne Cleveland, MD [facility]  . IR GENERIC HISTORICAL  05/21/2016   IR US GUIDE VASC ACCESS RIGHT 05/21/2016 Arne Cleveland, MD [facility]  . IR REMOVAL TUN ACCESS W/ PORT W/O FL MOD SED  10/27/2016  . SKIN BIOPSY Left    face  . THYROGLOSSAL DUCT CYST N/A 04/16/2016   Procedure: THYROGLOSSAL DUCT CYST excision;  Surgeon: [name], MD;  Location: [place];  Service: ENT;  Laterality: N/A;    There were no vitals filed for this visit.      Subjective Assessment - 01/17/17 1305    Subjective Where I had my Portacath removed, I expected not to be able to feel it, but it feels like it's pulling and it still  feels sore.  I had it removed in June.   Currently in Pain? Yes   Pain Score 6    Pain Location Shoulder   Pain Orientation Right   Pain Descriptors / Indicators Aching   Aggravating Factors  moving it, sleeping on right side   Pain Relieving Factors gets up and walks around                         [facility] Adult PT Treatment/Exercise - 01/17/17 0001      Exercises   Exercises Other Exercises   Other Exercises  Instructed in and had patient perform Meeks decompression exercises, holding each for 3 seconds and doing 5 reps of each.     Modalities   Modalities Moist Heat     Moist Heat Therapy   Number Minutes Moist Heat 15 Minutes  at end of session   Moist Heat Location Shoulder  across right shoulder and upper back in Lt. sidelying     Manual Therapy   Manual Therapy Soft tissue mobilization;Myofascial release;Scapular mobilization;Other (comment)   Soft tissue mobilization at right Portacath site [facility] was removed in June).   Scapular Mobilization in left sidelying to right scapula with movement  into protraction and depression   Other Manual Therapy in left sidelying, soft tissue work to right upper back with focus on trigger points in upper trap and periscapular area                PT Education - 01/17/17 1351    Education provided Yes   Education Details Meeks decompression exercises   Person(s) Educated Patient   Methods Explanation;Verbal cues;Handout   Comprehension Verbalized understanding;Returned demonstration           Short Term Clinic Goals - 01/12/17 1606      CC Short Term Goal  #1   Title Pt will report the pain in her right shoulder has decreased by 50% so that she is able to perform her daily tasks easier   Time 4   Period Weeks   Status New     CC Short Term Goal  #2   Title Pt will be independent in home program for posture and shoulder strength and ROM    Time 4   Period Weeks   Status New     CC Short Term Goal  #3    Title Pt will be independent in home program for posture and shoulder strength and ROM    Time 4   Period Weeks   Status New             Long Term Clinic Goals - 01/12/17 1602      CC Long Term Goal  #1   Title Pt will have increase in miniBEST score to 25/28    Baseline 22/28   Time 8   Period Weeks   Status New     CC Long Term Goal  #2   Title Pt will be independent in home program for balance and general strength   Time 8   Period Weeks   Status New     CC Long Term Goal  #3   Title Pt will increase gait velocity to 1.5 m/sec   Baseline 1.37 m/sec   Time 8   Period Weeks   Status New     CC Long Term Goal  #4   Title Pt will report she is able to return to her community exercise activities    Time 8   Period Weeks   Status New            Plan - 01/17/17 1352    Clinical Impression Statement Pt. did well with initial session, although even gentle isometric upper extremity holds aggravated right shoulder pain a bit. Pt. reported feeling better at end of session, after soft tissue work and hot pack to right upper back/shoulder area.   Rehab Potential Good   Clinical Impairments Affecting Rehab Potential CIPN    PT Frequency 2x / week   PT Duration 8 weeks   PT Treatment/Interventions ADLs/Self Care Home Management;Patient/family education;Gait training;Functional mobility training;Manual techniques;Therapeutic activities;Electrical Stimulation;Therapeutic exercise;Balance training;Neuromuscular re-education;Passive range of motion;Scar mobilization   PT Next Visit Plan Continue soft tissue work if beneficial, as well as moist heat, to right upper back and shoulder area.  Review Meeks as needed.  Consider teaching stretches for right traps and rhomboids. Advance to interscapular strengthening, staionary bike, closed Harrison ankle and LE strengthening; progress to Airex and balance activities with eyes closed.   Consulted and Agree with Plan of Care Patient       Patient will benefit from skilled therapeutic intervention in order to improve the following deficits and impairments:  Decreased endurance, Abnormal gait, Impaired sensation, Decreased scar mobility, Decreased knowledge of precautions, Decreased activity tolerance, Decreased strength, Increased fascial restricitons, Impaired UE functional use, Pain, Increased muscle spasms, Difficulty walking, Decreased balance, Decreased range of motion, Impaired perceived functional ability, Postural dysfunction  Visit Diagnosis: Abnormal posture  Acute pain of right shoulder  Stiffness of right shoulder, not elsewhere classified     Problem List Patient Active Problem List   Diagnosis Date Noted  . 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 NHL (non-Hodgkin's lymphoma) ([place]) 10/18/2016  . Hypersensitivity reaction 05/24/2016  . Hyperlipidemia, mixed 01/27/2016  . Grief reaction 01/27/2016  . Preventative health care 01/27/2016  . Anemia 01/27/2016  . History of colonic polyps 01/27/2016  . Actinic keratoses 01/27/2016  . History of chicken pox   . Vitamin D deficiency   . Diffuse large B cell lymphoma ([place])   . History of PCOS     Kamiyah Kindel 01/17/2017, 2:07 PM  [place], [place], [address] Phone: [phone]   Fax:  [phone]  Name: Lindsay Harrison MRN: [id number] Date of Birth: 07-Jul-1945  Serafina Royals, PT 01/17/17 2:07 PM

## 2017-01-19 ENCOUNTER — Ambulatory Visit: Payer: Medicare Other | Admitting: Physical Therapy

## 2017-01-20 ENCOUNTER — Ambulatory Visit: Payer: Medicare Other

## 2017-01-20 DIAGNOSIS — M25611 Stiffness of right shoulder, not elsewhere classified: Secondary | ICD-10-CM

## 2017-01-20 DIAGNOSIS — M25511 Pain in right shoulder: Secondary | ICD-10-CM | POA: Diagnosis not present

## 2017-01-20 DIAGNOSIS — M6281 Muscle weakness (generalized): Secondary | ICD-10-CM | POA: Diagnosis not present

## 2017-01-20 DIAGNOSIS — R209 Unspecified disturbances of skin sensation: Secondary | ICD-10-CM

## 2017-01-20 DIAGNOSIS — R262 Difficulty in walking, not elsewhere classified: Secondary | ICD-10-CM | POA: Diagnosis not present

## 2017-01-20 DIAGNOSIS — R293 Abnormal posture: Secondary | ICD-10-CM | POA: Diagnosis not present

## 2017-01-20 NOTE — Therapy (Signed)
[place], [place], [address] Phone: [phone]   Fax:  [phone]  Physical Therapy Treatment  Patient Details  Name: Lindsay Harrison MRN: [id number] Date of Birth: [birth year] Referring Provider: Dr. Irene Limbo   Encounter Date: 01/20/2017      PT End of Session - 01/20/17 1111    Visit Number 3   Number of Visits 17   Date for PT Re-Evaluation 03/14/17   PT Start Time 1638   PT Stop Time 1115  3 units is correct, had moist heat at end of session   PT Time Calculation (min) 56 min   Activity Tolerance Patient tolerated treatment well   Behavior During Therapy [facility] for tasks assessed/performed      Past Medical History:  Diagnosis Date  . Anemia    h/o iron deficiency secondary to heavy menses  . Arthritis    left hand index finger  . Diffuse large B cell lymphoma ([place])   . Grief reaction 01/27/2016  . H/O measles    as a child  . History of chicken pox    as a child  . History of PCOS   . Hyperlipidemia, mixed 01/27/2016  . Lymphadenopathy   . Preventative health care 01/27/2016  . Vitamin D deficiency   . Wears glasses     Past Surgical History:  Procedure Laterality Date  . COLONOSCOPY    . IR GENERIC HISTORICAL  05/21/2016   IR FLUORO GUIDE PORT INSERTION RIGHT 05/21/2016 Arne Cleveland, MD [facility]  . IR GENERIC HISTORICAL  05/21/2016   IR US GUIDE VASC ACCESS RIGHT 05/21/2016 Arne Cleveland, MD [facility]  . IR REMOVAL TUN ACCESS W/ PORT W/O FL MOD SED  10/27/2016  . SKIN BIOPSY Left    face  . THYROGLOSSAL DUCT CYST N/A 04/16/2016   Procedure: THYROGLOSSAL DUCT CYST excision;  Surgeon: [name], MD;  Location: [place];  Service: ENT;  Laterality: N/A;    There were no vitals filed for this visit.      Subjective Assessment - 01/20/17 1021    Subjective I "felt" my Rt shoulder a little more after last visit but when the soreness calmed down it was actually feeling a  little better.    Pertinent History lymphoma diagnosed in Dember 2017, she had removeal of grown under chin, had  chemotherapy with port inserted( Jan)  and removed ( July)  will have ongoing chemo every 60 day for 2 years    Currently in Pain? No/denies                         [facility] Adult PT Treatment/Exercise - 01/20/17 0001      Exercises   Other Exercises  Rt UE neural tension stretch on wall 3 times, 5 sec holds     Shoulder Exercises: Isometric Strengthening   Flexion 5X5"  In doorway with Rt UE   Extension 5X5"  In doorway with Rt UE    External Rotation 5X5"  In doorway with Rt UE   ABduction 5X5"  Against door with Rt UE     Moist Heat Therapy   Number Minutes Moist Heat 15 Minutes  at end of session   Moist Heat Location Shoulder  across Rt shoulder and upper back in Lt S/L     Manual Therapy   Manual Therapy Soft tissue mobilization;Myofascial release;Scapular mobilization;Other (comment);Passive ROM   Soft tissue mobilization at right Portacath site (  Port was removed in June).   Scapular Mobilization in left sidelying to right scapula with movement into protraction and depression   Passive ROM In Supine to Rt shoulder slowly into flexion, abduction mostly, then briefly into er all to pts tolerance; also to Rt upper trap with triger point release here   Other Manual Therapy in left sidelying, soft tissue work to right upper back with focus on trigger points in upper trap and periscapular area     Neck Exercises: Stretches   Upper Trapezius Stretch 2 reps;10 seconds  Rt side, also showed with overpresure   Other Neck Stretches Rt Rhomboid Stretch 2 times, also demonstrating with overpressure                PT Education - 01/20/17 1103    Education provided Yes   Education Details Isometric strengthening for Rt shoulder and neural tension stretch   Person(s) Educated Patient   Methods Explanation;Demonstration;Handout   Comprehension  Verbalized understanding;Returned demonstration;Need further instruction           Short Term Clinic Goals - 01/12/17 1606      CC Short Term Goal  #1   Title Pt will report the pain in her right shoulder has decreased by 50% so that she is able to perform her daily tasks easier   Time 4   Period Weeks   Status New     CC Short Term Goal  #2   Title Pt will be independent in home program for posture and shoulder strength and ROM    Time 4   Period Weeks   Status New     CC Short Term Goal  #3   Title Pt will be independent in home program for posture and shoulder strength and ROM    Time 4   Period Weeks   Status New             Long Term Clinic Goals - 01/12/17 1602      CC Long Term Goal  #1   Title Pt will have increase in miniBEST score to 25/28    Baseline 22/28   Time 8   Period Weeks   Status New     CC Long Term Goal  #2   Title Pt will be independent in home program for balance and general strength   Time 8   Period Weeks   Status New     CC Long Term Goal  #3   Title Pt will increase gait velocity to 1.5 m/sec   Baseline 1.37 m/sec   Time 8   Period Weeks   Status New     CC Long Term Goal  #4   Title Pt will report she is able to return to her community exercise activities    Time 8   Period Weeks   Status New            Plan - 01/20/17 1111    Clinical Impression Statement Pt did have some pain at end ROM with stretching but we still saw increased P/ROM throughout session and pt reported being surprised her arm could go that far. She did well with isometric strengthening. Just required VC to not push into pain, same with neural tension stretch.    Rehab Potential Good   Clinical Impairments Affecting Rehab Potential CIPN    PT Frequency 2x / week   PT Duration 8 weeks   PT Treatment/Interventions ADLs/Self Care Home Management;Patient/family education;Gait training;Functional mobility  training;Manual techniques;Therapeutic  activities;Electrical Stimulation;Therapeutic exercise;Balance training;Neuromuscular re-education;Passive range of motion;Scar mobilization   PT Next Visit Plan Continue soft tissue work if beneficial, as well as moist heat, to right upper back and shoulder area.  Review Isometrics and stretches for right traps and rhomboids prn. Advance to interscapular strengthening, staionary bike, closed chain ankle and LE strengthening; progress to Airex and balance activities with eyes closed.   Consulted and Agree with Plan of Care Patient      Patient will benefit from skilled therapeutic intervention in order to improve the following deficits and impairments:  Decreased endurance, Abnormal gait, Impaired sensation, Decreased scar mobility, Decreased knowledge of precautions, Decreased activity tolerance, Decreased strength, Increased fascial restricitons, Impaired UE functional use, Pain, Increased muscle spasms, Difficulty walking, Decreased balance, Decreased range of motion, Impaired perceived functional ability, Postural dysfunction  Visit Diagnosis: Abnormal posture  Acute pain of right shoulder  Stiffness of right shoulder, not elsewhere classified  Unspecified disturbances of skin sensation  Muscle weakness (generalized)     Problem List Patient Active Problem List   Diagnosis Date Noted  . 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 NHL (non-Hodgkin's lymphoma) ([place]) 10/18/2016  . Hypersensitivity reaction 05/24/2016  . Hyperlipidemia, mixed 01/27/2016  . Grief reaction 01/27/2016  . Preventative health care 01/27/2016  . Anemia 01/27/2016  . History of colonic polyps 01/27/2016  . Actinic keratoses 01/27/2016  . History of chicken pox   . Vitamin D deficiency   . Diffuse large B cell lymphoma ([place])   . History of PCOS     Otelia Limes, PTA 01/20/2017, 11:21 AM  [place], [place], [address] Phone: [phone]   Fax:  [phone]  Name: Lindsay Harrison MRN: [id number] Date of Birth: February 01, 1946

## 2017-01-20 NOTE — Patient Instructions (Signed)
Flexion (Isometric)      Cancer Rehab [phone]    Press right fist against wall. Hold __5__ seconds. Repeat _5-10___ times. Do __1-2__ sessions per day.  SHOULDER: Abduction (Isometric)    Use wall as resistance. Press arm against pillow. Hold _5__ seconds. _5-10__ times. Do _1-2__ sessions per day.    External Rotation (Isometric)    Place back of left fist against door frame, with elbow bent. Press fist against door frame. Hold __5__ seconds. Repeat _5-10___ times. Do _1-2___ sessions per day.  Extension (Isometric)    Place left bent elbow and back of arm against wall. Press elbow against wall. Hold __5__ seconds. Repeat _5-10___ times. Do _1-2___ sessions per day.   Facing away from wall, place Rt hand with fingers pointing to ceiling on wall at shoulder height and keeping elbow straight, slowly turn hand away until stretch felt in arm. Hold 5 seconds, repeating only 3-5 times each; 2-3 times a day.

## 2017-01-26 ENCOUNTER — Ambulatory Visit: Payer: Medicare Other

## 2017-01-26 DIAGNOSIS — M6281 Muscle weakness (generalized): Secondary | ICD-10-CM | POA: Diagnosis not present

## 2017-01-26 DIAGNOSIS — M25511 Pain in right shoulder: Secondary | ICD-10-CM

## 2017-01-26 DIAGNOSIS — M25611 Stiffness of right shoulder, not elsewhere classified: Secondary | ICD-10-CM | POA: Diagnosis not present

## 2017-01-26 DIAGNOSIS — R262 Difficulty in walking, not elsewhere classified: Secondary | ICD-10-CM

## 2017-01-26 DIAGNOSIS — R209 Unspecified disturbances of skin sensation: Secondary | ICD-10-CM

## 2017-01-26 DIAGNOSIS — R293 Abnormal posture: Secondary | ICD-10-CM

## 2017-01-26 NOTE — Therapy (Signed)
[place], [place], [address] Phone: [phone]   Fax:  [phone]  Physical Therapy Treatment  Patient Details  Name: Lindsay Harrison MRN: [id number] Date of Birth: 1945/12/04 Referring Provider: Dr. Irene Limbo   Encounter Date: 01/26/2017      PT End of Session - 01/26/17 1714    Visit Number 4   Number of Visits 17   Date for PT Re-Evaluation 03/14/17   PT Start Time 1440   PT Stop Time 1520   PT Time Calculation (min) 40 min   Activity Tolerance Patient tolerated treatment well   Behavior During Therapy [facility] for tasks assessed/performed      Past Medical History:  Diagnosis Date  . Anemia    h/o iron deficiency secondary to heavy menses  . Arthritis    left hand index finger  . Diffuse large B cell lymphoma ([place])   . Grief reaction 01/27/2016  . H/O measles    as a child  . History of chicken pox    as a child  . History of PCOS   . Hyperlipidemia, mixed 01/27/2016  . Lymphadenopathy   . Preventative health care 01/27/2016  . Vitamin D deficiency   . Wears glasses     Past Surgical History:  Procedure Laterality Date  . COLONOSCOPY    . IR GENERIC HISTORICAL  05/21/2016   IR FLUORO GUIDE PORT INSERTION RIGHT 05/21/2016 Arne Cleveland, MD [facility]  . IR GENERIC HISTORICAL  05/21/2016   IR US GUIDE VASC ACCESS RIGHT 05/21/2016 Arne Cleveland, MD [facility]  . IR REMOVAL TUN ACCESS W/ PORT W/O FL MOD SED  10/27/2016  . SKIN BIOPSY Left    face  . THYROGLOSSAL DUCT CYST N/A 04/16/2016   Procedure: THYROGLOSSAL DUCT CYST excision;  Surgeon: [name], MD;  Location: [place];  Service: ENT;  Laterality: N/A;    There were no vitals filed for this visit.      Subjective Assessment - 01/26/17 1444    Subjective I've been doing the wall presses (isometrics) and those are still challenging but going okay. The neural tension stretch on the wall still hurts but I've been trying to  do it gently.    Pertinent History lymphoma diagnosed in December 2017, she had removeal of grown under chin, had  chemotherapy with port inserted( Jan)  and removed ( July)  will have ongoing chemo every 60 day for 2 years    Currently in Pain? No/denies                         [facility] Adult PT Treatment/Exercise - 01/26/17 0001      Knee/Hip Exercises: Aerobic   Nustep Level 4, x 5 minutes  Min pain at Lt med knee but this dec with keeping LE aligned     Knee/Hip Exercises: Standing   Hip Flexion Stengthening;Right;Left;10 reps  At counter with 0-1 HHA throughout   Hip Abduction Stengthening;Right;Left;10 reps  At counter with 0-1 HHA throughout   Hip Extension Stengthening;Right;Left;10 reps  At counter with 0-1 HHA throughout     Manual Therapy   Manual Therapy Soft tissue mobilization;Myofascial release;Scapular mobilization;Other (comment);Passive ROM   Soft tissue mobilization at right Portacath site [facility] was removed in June).   Passive ROM In Supine to Rt shoulder slowly into flexion, abduction mostly, then briefly into er all to pts tolerance; also to Rt upper trap with trigger point release here  PT Education - 01/26/17 1520    Education provided Yes   Education Details Bil hip 3 way raises at counter and supine SLR   Person(s) Educated Patient   Methods Explanation;Demonstration;Handout   Comprehension Verbalized understanding;Returned demonstration;Need further instruction           Short Term Clinic Goals - 01/12/17 1606      CC Short Term Goal  #1   Title Pt will report the pain in her right shoulder has decreased by 50% so that she is able to perform her daily tasks easier   Time 4   Period Weeks   Status New     CC Short Term Goal  #2   Title Pt will be independent in home program for posture and shoulder strength and ROM    Time 4   Period Weeks   Status New     CC Short Term Goal  #3   Title Pt will be  independent in home program for posture and shoulder strength and ROM    Time 4   Period Weeks   Status New             Long Term Clinic Goals - 01/12/17 1602      CC Long Term Goal  #1   Title Pt will have increase in miniBEST score to 25/28    Baseline 22/28   Time 8   Period Weeks   Status New     CC Long Term Goal  #2   Title Pt will be independent in home program for balance and general strength   Time 8   Period Weeks   Status New     CC Long Term Goal  #3   Title Pt will increase gait velocity to 1.5 m/sec   Baseline 1.37 m/sec   Time 8   Period Weeks   Status New     CC Long Term Goal  #4   Title Pt will report she is able to return to her community exercise activities    Time 8   Period Weeks   Status New            Plan - 01/26/17 1717    Clinical Impression Statement Pt continues with pain with end ROM stretching but her P/ROM did continue to progress by end of stretching. Progressed her program to include bil LE strength and endurance strengthening with the NuStep which she tolerated well. Continued to remind pt not to push into pain with neural tension stretch as she reports she's been doing this alot but hasn't seen progress.    Rehab Potential Good   Clinical Impairments Affecting Rehab Potential CIPN    PT Frequency 2x / week   PT Duration 8 weeks   PT Treatment/Interventions ADLs/Self Care Home Management;Patient/family education;Gait training;Functional mobility training;Manual techniques;Therapeutic activities;Electrical Stimulation;Therapeutic exercise;Balance training;Neuromuscular re-education;Passive range of motion;Scar mobilization   PT Next Visit Plan Continue soft tissue work if beneficial, as well as moist heat, to right upper back and shoulder area.  Cont stretches for right traps and rhomboids prn. Assess tolerance of NuStep. Advance to interscapular strengthening, closed chain ankle and LE strengthening; progress to Airex and balance  activities with eyes closed.   Consulted and Agree with Plan of Care Patient      Patient will benefit from skilled therapeutic intervention in order to improve the following deficits and impairments:  Decreased endurance, Abnormal gait, Impaired sensation, Decreased scar mobility, Decreased knowledge of precautions, Decreased activity  tolerance, Decreased strength, Increased fascial restricitons, Impaired UE functional use, Pain, Increased muscle spasms, Difficulty walking, Decreased balance, Decreased range of motion, Impaired perceived functional ability, Postural dysfunction  Visit Diagnosis: Abnormal posture  Acute pain of right shoulder  Stiffness of right shoulder, not elsewhere classified  Unspecified disturbances of skin sensation  Muscle weakness (generalized)  Difficulty in walking     Problem List Patient Active Problem List   Diagnosis Date Noted  . 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 NHL (non-Hodgkin's lymphoma) ([place]) 10/18/2016  . Hypersensitivity reaction 05/24/2016  . Hyperlipidemia, mixed 01/27/2016  . Grief reaction 01/27/2016  . Preventative health care 01/27/2016  . Anemia 01/27/2016  . History of colonic polyps 01/27/2016  . Actinic keratoses 01/27/2016  . History of chicken pox   . Vitamin D deficiency   . Diffuse large B cell lymphoma ([place])   . History of PCOS     Otelia Limes, PTA 01/26/2017, 5:22 PM  [place], [place], [address] Phone: [phone]   Fax:  [phone]  Name: Lindsay Harrison MRN: [id number] Date of Birth: 1945/05/15

## 2017-01-26 NOTE — Patient Instructions (Addendum)
Flexion and Extension - Standing    Stand and support self at counter while swinging Rt leg forward, hold 3 sec and then bring it back keeping it elevated off the floor throughout for single leg stance on supporting limb. Then do same for backward leg swings lifting leg until you feel glut tighten and without leaning forward _10-15__ times each.  Repeat with other leg. Do _2__ times per day.   ABDUCTION: Standing (Active)    Stand, feet flat. Lift right leg out to side keeping tummy tight and toes pointing forward (think about leading with heel).  Complete _1-2__ sets of _10-15__ repetitions. Perform _2__ sessions per day.  HIP: Flexion / KNEE: Extension, Straight Leg Raise    Raise leg, keeping knee straight by tightening quad (thigh). Perform slowly. _10__ reps per set, _2__ sets per day.   Cancer Rehab [phone]

## 2017-01-28 ENCOUNTER — Ambulatory Visit: Payer: Medicare Other | Admitting: Physical Therapy

## 2017-01-28 ENCOUNTER — Encounter: Payer: Medicare Other | Admitting: Family Medicine

## 2017-01-28 DIAGNOSIS — M25511 Pain in right shoulder: Secondary | ICD-10-CM

## 2017-01-28 DIAGNOSIS — M25611 Stiffness of right shoulder, not elsewhere classified: Secondary | ICD-10-CM

## 2017-01-28 DIAGNOSIS — R293 Abnormal posture: Secondary | ICD-10-CM | POA: Diagnosis not present

## 2017-01-28 DIAGNOSIS — R209 Unspecified disturbances of skin sensation: Secondary | ICD-10-CM

## 2017-01-28 DIAGNOSIS — R262 Difficulty in walking, not elsewhere classified: Secondary | ICD-10-CM | POA: Diagnosis not present

## 2017-01-28 DIAGNOSIS — M6281 Muscle weakness (generalized): Secondary | ICD-10-CM | POA: Diagnosis not present

## 2017-01-28 NOTE — Therapy (Signed)
[place], [place], [address] Phone: [phone]   Fax:  [phone]  Physical Therapy Treatment  Patient Details  Name: Lindsay Harrison MRN: [id number] Date of Birth: 1946/04/04 Referring Provider: Dr. Irene Limbo   Encounter Date: 01/28/2017      PT End of Session - 01/28/17 1038    Visit Number 5   Number of Visits 17   Date for PT Re-Evaluation 03/14/17   PT Start Time 0933   PT Stop Time 1030   PT Time Calculation (min) 57 min   Activity Tolerance Patient tolerated treatment well   Behavior During Therapy [place] for tasks assessed/performed      Past Medical History:  Diagnosis Date  . Anemia    h/o iron deficiency secondary to heavy menses  . Arthritis    left hand index finger  . Diffuse large B cell lymphoma ([place])   . Grief reaction 01/27/2016  . H/O measles    as a child  . History of chicken pox    as a child  . History of PCOS   . Hyperlipidemia, mixed 01/27/2016  . Lymphadenopathy   . Preventative health care 01/27/2016  . Vitamin D deficiency   . Wears glasses     Past Surgical History:  Procedure Laterality Date  . COLONOSCOPY    . IR GENERIC HISTORICAL  05/21/2016   IR FLUORO GUIDE PORT INSERTION RIGHT 05/21/2016 Arne Cleveland, MD [facility]  . IR GENERIC HISTORICAL  05/21/2016   IR US GUIDE VASC ACCESS RIGHT 05/21/2016 Arne Cleveland, MD [facility]  . IR REMOVAL TUN ACCESS W/ PORT W/O FL MOD SED  10/27/2016  . SKIN BIOPSY Left    face  . THYROGLOSSAL DUCT CYST N/A 04/16/2016   Procedure: THYROGLOSSAL DUCT CYST excision;  Surgeon: [name], MD;  Location: [facility];  Service: ENT;  Laterality: N/A;    There were no vitals filed for this visit.      Subjective Assessment - 01/28/17 0936    Subjective Nothing new to report.  Doing some exercises with my knees have aggravated my knees a little.   Currently in Pain? Yes   Pain Score --  a little   Pain Location  Shoulder  right upper trap   Pain Orientation Right   Aggravating Factors  exercises may have aggravated it   Multiple Pain Sites Yes   Pain Score 5   Pain Location Knee  at tendon   Pain Orientation Right;Left   Pain Descriptors / Indicators Sore   Aggravating Factors  exercises for knees   Pain Relieving Factors nothing                         [facility] Adult PT Treatment/Exercise - 01/28/17 0001      Self-Care   Self-Care Other Self-Care Comments   Other Self-Care Comments  Discussed things that could be tried for neuropathy.  Wearing compression stockings is not a good option for her because she is hot-natured.  She will try hot pack at home to feet and assess whether electrical stimulation helped, with possiblity of home TENS unit if so.     Modalities   Modalities Cryotherapy     Cryotherapy   Number Minutes Cryotherapy 20 Minutes   Cryotherapy Location Knee  bilat.   Type of Cryotherapy Ice pack     Electrical Stimulation   Electrical Stimulation Location feet  ball of foot and dorsal  foot on each, right and left foot   Electrical Stimulation Action Premod   Electrical Stimulation Parameters 80-150 Hz x 20 mins.   Electrical Stimulation Goals Pain     Manual Therapy   Scapular Mobilization depression of right scapula to stretch right upper trap; in left sidelying, movement of right scapula into protraction and depression   Passive ROM Tried right shoulder into er and abduction, but patient had pain in right upper trap with this; okay with passive right shoulder flexion. Neck left sidebend with depression of right shoulder to stretch right upper trap.   Other Manual Therapy in supine, trigger point release and soft tissue work to right upper trap; in left sidelying soft tissue work to right upper back area for muscle relaxation                PT Education - 01/28/17 1037    Education provided Yes   Education Details okay to use heat to feet to  see if that relieves some neuropathy pain; okay to use ice to knees   Person(s) Educated Patient   Methods Explanation   Comprehension Verbalized understanding           [facility] Goals - 01/28/17 [id number]      CC Short Term Goal  #1   Title Pt will report the pain in her right shoulder has decreased by 50% so that she is able to perform her daily tasks easier   Baseline 25% as of 01/28/17   Status Partially Met     CC Short Term Goal  #2   Title Pt will be independent in home program for posture and shoulder strength and ROM    Status Partially [place] - 01/12/17 1602      CC Long Term Goal  #1   Title Pt will have increase in miniBEST score to 25/28    Baseline 22/28   Time 8   Period Weeks   Status New     CC Long Term Goal  #2   Title Pt will be independent in home program for balance and general strength   Time 8   Period Weeks   Status New     CC Long Term Goal  #3   Title Pt will increase gait velocity to 1.5 m/sec   Baseline 1.37 m/sec   Time 8   Period Weeks   Status New     CC Long Term Goal  #4   Title Pt will report she is able to return to her community exercise activities    Time 8   Period Weeks   Status New            Plan - 01/28/17 1038    Clinical Impression Statement Pt. has partially met short term goals.  She developed some knee soreness--possibly patellar tendon--from last visit, so cold packs were tried on this today. Electrical stimulation tried on feet for neuropathy. She did feel that manual therapy helped right shoulder area. She MAY have aggravated right upper trap with arm circles while walking--she was encouraged to limite how much she does these and to do shoulder depression and scapular retraction to balance out this activity.   Rehab Potential Good   Clinical Impairments Affecting Rehab Potential CIPN    PT Frequency 2x / week   PT Duration 8 weeks   PT Treatment/Interventions  ADLs/Self Care  Home Management;Patient/family education;Gait training;Functional mobility training;Manual techniques;Therapeutic activities;Electrical Stimulation;Therapeutic exercise;Balance training;Neuromuscular re-education;Passive range of motion;Scar mobilization   PT Next Visit Plan Assess benefit of electrical stimulation to feet, cold pack to knees, and if she has tried heat at home, of heat to feet.  Continue soft tissue work and ROM to right upper quadrant. Focus more on neuropathy too.   Consulted and Agree with Plan of Care Patient      Patient will benefit from skilled therapeutic intervention in order to improve the following deficits and impairments:  Decreased endurance, Abnormal gait, Impaired sensation, Decreased scar mobility, Decreased knowledge of precautions, Decreased activity tolerance, Decreased strength, Increased fascial restricitons, Impaired UE functional use, Pain, Increased muscle spasms, Difficulty walking, Decreased balance, Decreased range of motion, Impaired perceived functional ability, Postural dysfunction  Visit Diagnosis: Abnormal posture  Acute pain of right shoulder  Stiffness of right shoulder, not elsewhere classified  Unspecified disturbances of skin sensation     Problem List Patient Active Problem List   Diagnosis Date Noted  . 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 NHL (non-Hodgkin's lymphoma) ([place]) 10/18/2016  . Hypersensitivity reaction 05/24/2016  . Hyperlipidemia, mixed 01/27/2016  . Grief reaction 01/27/2016  . Preventative health care 01/27/2016  . Anemia 01/27/2016  . History of colonic polyps 01/27/2016  . Actinic keratoses 01/27/2016  . History of chicken pox   . Vitamin D deficiency   . Diffuse large B cell lymphoma ([place])   . History of PCOS     Jonie Burdell 01/28/2017, 10:44 AM  [place], [place],  [address] Phone: [phone]   Fax:  [phone]  Name: Lindsay Harrison MRN: [id number] Date of Birth: April 30, 1946  Serafina Royals, PT 01/28/17 10:45 AM

## 2017-01-28 NOTE — Progress Notes (Signed)
Subjective:   Lindsay Harrison is a 71 y.o. female who presents for Medicare Annual (Subsequent) preventive examination.  Review of Systems:  No ROS.  Medicare Wellness Visit. Additional risk factors are reflected in the social history.   Female:   Pap-  Pt states she will schedule today.     Mammo- last 12/27/16: normal      Dexa scan-  Last 01/27/16-osteopenia      CCS- pt reports last was less than 10 yrs ago. Records release sent    Objective:     Vitals: BP 139/72   Pulse (!) 57   Temp 98.2 F (36.8 C) (Oral)   Resp 18   Ht 5' 8.6" (1.742 m)   Wt 161 lb (73 kg)   SpO2 99%   BMI 24.05 kg/m   Body mass index is 24.05 kg/m.   Tobacco History  Smoking Status  . Never Smoker  Smokeless Tobacco  . Never Used     Counseling given: Not Answered   Past Medical History:  Diagnosis Date  . Anemia    h/o iron deficiency secondary to heavy menses  . Arthritis    left hand index finger  . Diffuse large B cell lymphoma ([place])   . Grief reaction 01/27/2016  . H/O measles    as a child  . History of chicken pox    as a child  . History of PCOS   . Hyperlipidemia, mixed 01/27/2016  . Lymphadenopathy   . 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 Preventative health care 01/27/2016  . Vitamin D deficiency   . Wears glasses    Past Surgical History:  Procedure Laterality Date  . COLONOSCOPY    . IR GENERIC HISTORICAL  05/21/2016   IR FLUORO GUIDE PORT INSERTION RIGHT 05/21/2016 Arne Cleveland, MD [facility]  . IR GENERIC HISTORICAL  05/21/2016   IR US GUIDE VASC ACCESS RIGHT 05/21/2016 Arne Cleveland, MD [facility]  . IR REMOVAL TUN ACCESS W/ PORT W/O FL MOD SED  10/27/2016  . SKIN BIOPSY Left    face  . THYROGLOSSAL DUCT CYST N/A 04/16/2016   Procedure: THYROGLOSSAL DUCT CYST excision;  Surgeon: [name], MD;  [facility]: [facility];  Service: ENT;  Laterality: N/A;   Family History  Problem Relation Age of Onset  . Arthritis Mother        rheumatoid  . Heart  disease Father        CHF  . COPD Sister        Emphysema, h/o cigarettes  . Other Sister        pituatary tumor  . Arthritis Sister   . Obesity Sister   . Cancer Paternal Grandfather        cancer  . Stroke Maternal Aunt    History  Sexual Activity  . Sexual activity: No    Outpatient Encounter Prescriptions as of 01/31/2017  Medication Sig  . Calcium-Magnesium-Vitamin D (CALCIUM MAGNESIUM PO) Take 1 tablet by mouth daily.  . Cholecalciferol (VITAMIN D) 2000 units CAPS Take 2,000 Units by mouth daily.  . Omega-3 Fatty Acids (OMEGA-3 PO) Take 2 capsules by mouth daily. Omega 3 1280 mg  . dexamethasone (DECADRON) 4 MG tablet TAKE 3 TABLETS BY MOUTH TWICE DAILY, TAKE 1 DOSE IN THE MORNING AND 1 DOSE IN THE EVENING THE DAY PRIOR TO Rituxan (Patient not taking: Reported on 01/31/2017)  . [DISCONTINUED] aspirin 81 MG tablet Take 81 mg by mouth daily. Has stopped prior to procedure  . [  DISCONTINUED] chlorhexidine (PERIDEX) 0.12 % solution Use as directed 15 mLs in the mouth or throat 2 (two) times daily. (Patient not taking: Reported on 01/12/2017)  . [DISCONTINUED] Vitamin D, Ergocalciferol, (DRISDOL) 50000 units CAPS capsule TAKE 1 CAPSULE BY MOUTH 1 TIME A WEEK (Patient not taking: Reported on 01/12/2017)   No facility-administered encounter medications on file as of 01/31/2017.     Activities of Daily Living In your present state of health, do you have any difficulty performing the following activities: 01/31/2017 10/27/2016  Hearing? N N  Vision? N N  Comment Wears glasses for distance -  Difficulty concentrating or making decisions? N N  Walking or climbing stairs? N N  Dressing or bathing? N -  Doing errands, shopping? N -  Preparing Food and eating ? N -  Using the Toilet? N -  In the past six months, have you accidently leaked urine? N -  Do you have problems with loss of bowel control? N -  Managing your Medications? N -  Managing your Finances? N -  Housekeeping or managing  your Housekeeping? N -  Some recent data might be hidden    Patient Care Team: [name], MD as PCP - General (Family Medicine)    Assessment:    Physical assessment deferred to PCP.  Exercise Activities and Dietary recommendations Current Exercise Habits: Home exercise routine, Time (Minutes): 60, Frequency (Times/Week): 7, Weekly Exercise (Minutes/Week): 420, Intensity: Mild Diet (meal preparation, eat out, water intake, caffeinated beverages, dairy products, fruits and vegetables): in general, a "healthy" diet     Goals    None     Fall Risk Fall Risk  01/31/2017 01/31/2017 08/06/2015 02/05/2015 01/16/2014  Falls in the past year? No No No No No   Depression Screen PHQ 2/9 Scores 01/31/2017 01/31/2017 08/06/2015 02/05/2015  PHQ - 2 Score 0 0 0 0     Cognitive Function Ad8 score reviewed for issues:  Issues making decisions:no  Less interest in hobbies / activities:no  Repeats questions, stories (family complaining):no  Trouble using ordinary gadgets (microwave, computer, phone):no  Forgets the month or year: no  Mismanaging finances: no  Remembering appts:no  Daily problems with thinking and/or memory:no Ad8 score is=0        Immunization History  Administered Date(s) Administered  . Hepatitis A 07/17/2007, 04/04/2008  . Hepatitis B 07/17/2007, 09/11/2007, 02/15/2008  . Influenza Split 02/24/2013  . Influenza, High Dose Seasonal PF 01/27/2016  . Influenza,inj,Quad PF,6+ Mos 01/16/2014, 02/05/2015, 01/10/2017  . Pneumococcal Conjugate-13 01/16/2014  . Pneumococcal Polysaccharide-23 10/18/2016  . Pneumococcal-Unspecified 02/12/2009  . Td 10/27/2005  . Tdap 02/05/2015  . Typhoid Inactivated 07/17/2007  . Zoster 06/26/2007   Screening Tests Health Maintenance  Topic Date Due  . MAMMOGRAM  12/28/2018  . COLONOSCOPY  03/03/2021  . TETANUS/TDAP  02/04/2025  . INFLUENZA VACCINE  Completed  . DEXA SCAN  Completed  . Hepatitis C Screening  Completed  .  PNA vac Low Risk Adult  Completed      Plan:   Follow up with Dr.Blyth as directed  Continue to eat heart healthy diet (full of fruits, vegetables, whole grains, lean protein, water--limit salt, fat, and sugar intake) and increase physical activity as tolerated.  Continue doing brain stimulating activities (puzzles, reading, adult coloring books, staying active) to keep memory sharp.     I have personally reviewed and noted the following in the patient's chart:   . Medical and social history . Use of alcohol, tobacco  or illicit drugs  . Current medications and supplements . Functional ability and status . Nutritional status . Physical activity . Advanced directives . List of other physicians . Hospitalizations, surgeries, and ER visits in previous 12 months . Vitals . Screenings to include cognitive, depression, and falls . Referrals and appointments  In addition, I have reviewed and discussed with patient certain preventive protocols, quality metrics, and best practice recommendations. A written personalized care plan for preventive services as well as general preventive health recommendations were provided to patient.     Shela Nevin, [place]  01/31/2017

## 2017-01-31 ENCOUNTER — Encounter: Payer: Medicare Other | Admitting: Family Medicine

## 2017-01-31 ENCOUNTER — Ambulatory Visit: Payer: Medicare Other | Attending: Hematology | Admitting: Physical Therapy

## 2017-01-31 ENCOUNTER — Ambulatory Visit (INDEPENDENT_AMBULATORY_CARE_PROVIDER_SITE_OTHER): Payer: Medicare Other | Admitting: Family Medicine

## 2017-01-31 ENCOUNTER — Ambulatory Visit: Payer: Medicare Other | Admitting: *Deleted

## 2017-01-31 ENCOUNTER — Encounter: Payer: Self-pay | Admitting: Family Medicine

## 2017-01-31 VITALS — BP 139/72 | HR 57 | Temp 98.2°F | Resp 18 | Ht 68.6 in | Wt 161.0 lb

## 2017-01-31 DIAGNOSIS — R739 Hyperglycemia, unspecified: Secondary | ICD-10-CM

## 2017-01-31 DIAGNOSIS — M6281 Muscle weakness (generalized): Secondary | ICD-10-CM | POA: Diagnosis not present

## 2017-01-31 DIAGNOSIS — G6289 Other specified polyneuropathies: Secondary | ICD-10-CM | POA: Diagnosis not present

## 2017-01-31 DIAGNOSIS — G629 Polyneuropathy, unspecified: Secondary | ICD-10-CM | POA: Insufficient documentation

## 2017-01-31 DIAGNOSIS — C8298 Follicular lymphoma, unspecified, lymph nodes of multiple sites: Secondary | ICD-10-CM

## 2017-01-31 DIAGNOSIS — Z0000 Encounter for general adult medical examination without abnormal findings: Secondary | ICD-10-CM

## 2017-01-31 DIAGNOSIS — M25511 Pain in right shoulder: Secondary | ICD-10-CM | POA: Insufficient documentation

## 2017-01-31 DIAGNOSIS — R262 Difficulty in walking, not elsewhere classified: Secondary | ICD-10-CM

## 2017-01-31 DIAGNOSIS — R293 Abnormal posture: Secondary | ICD-10-CM | POA: Diagnosis not present

## 2017-01-31 DIAGNOSIS — E782 Mixed hyperlipidemia: Secondary | ICD-10-CM

## 2017-01-31 DIAGNOSIS — F4321 Adjustment disorder with depressed mood: Secondary | ICD-10-CM

## 2017-01-31 DIAGNOSIS — M25611 Stiffness of right shoulder, not elsewhere classified: Secondary | ICD-10-CM | POA: Insufficient documentation

## 2017-01-31 DIAGNOSIS — C8330 Diffuse large B-cell lymphoma, unspecified site: Secondary | ICD-10-CM

## 2017-01-31 DIAGNOSIS — E559 Vitamin D deficiency, unspecified: Secondary | ICD-10-CM

## 2017-01-31 DIAGNOSIS — R209 Unspecified disturbances of skin sensation: Secondary | ICD-10-CM | POA: Insufficient documentation

## 2017-01-31 HISTORY — DX: Polyneuropathy, unspecified: G62.9

## 2017-01-31 LAB — LIPID PANEL
Cholesterol: 241 mg/dL — ABNORMAL HIGH (ref 0–200)
HDL: 75.3 mg/dL (ref >39.00)
LDL Cholesterol: 153 mg/dL — ABNORMAL HIGH (ref 0–99)
NonHDL: 166.08
Total CHOL/HDL Ratio: 3
Triglycerides: 63 mg/dL (ref 0.0–149.0)
VLDL: 12.6 mg/dL (ref 0.0–40.0)

## 2017-01-31 LAB — COMPREHENSIVE METABOLIC PANEL
ALT: 21 U/L (ref 0–35)
AST: 18 U/L (ref 0–37)
Albumin: 4.3 g/dL (ref 3.5–5.2)
Alkaline Phosphatase: 117 U/L (ref 39–117)
BUN: 12 mg/dL (ref 6–23)
CO2: 29 mEq/L (ref 19–32)
Calcium: 9.6 mg/dL (ref 8.4–10.5)
Chloride: 104 mEq/L (ref 96–112)
Creatinine, Ser: 0.54 mg/dL (ref 0.40–1.20)
GFR: 118.33 mL/min (ref >60.00)
Glucose, Bld: 94 mg/dL (ref 70–99)
Potassium: 4.3 mEq/L (ref 3.5–5.1)
Sodium: 140 mEq/L (ref 135–145)
Total Bilirubin: 0.6 mg/dL (ref 0.2–1.2)
Total Protein: 6.4 g/dL (ref 6.0–8.3)

## 2017-01-31 LAB — HEMOGLOBIN A1C: Hgb A1c MFr Bld: 5.6 % (ref 4.6–6.5)

## 2017-01-31 LAB — VITAMIN D 25 HYDROXY (VIT D DEFICIENCY, FRACTURES): VITD: 25.98 ng/mL — ABNORMAL LOW (ref 30.00–100.00)

## 2017-01-31 NOTE — Assessment & Plan Note (Signed)
Husband died roughly 5 weeks ago after an 8 year battle with Glioblastoma. Still grieving

## 2017-01-31 NOTE — Assessment & Plan Note (Signed)
In remission. Port has been removed. She is tolerance maintenance therapy

## 2017-01-31 NOTE — Progress Notes (Signed)
HPI        ROS  Physical Exam  .

## 2017-01-31 NOTE — Patient Instructions (Addendum)
Cholesterol Cholesterol is a white, waxy, fat-like substance that is needed by the human body in small amounts. The liver makes all the cholesterol we need. Cholesterol is carried from the liver by the blood through the blood vessels. Deposits of cholesterol (plaques) may build up on blood vessel (artery) walls. Plaques make the arteries narrower and stiffer. Cholesterol plaques increase the risk for heart attack and stroke. You cannot feel your cholesterol level even if it is very high. The only way to know that it is high is to have a blood test. Once you know your cholesterol levels, you should keep a record of the test results. Work with your health care provider to keep your levels in the desired range. What do the results mean?  Total cholesterol is a rough measure of all the cholesterol in your blood.  LDL (low-density lipoprotein) is the "bad" cholesterol. This is the type that causes plaque to build up on the artery walls. You want this level to be low.  HDL (high-density lipoprotein) is the "good" cholesterol because it cleans the arteries and carries the LDL away. You want this level to be high.  Triglycerides are fat that the body can either burn for energy or store. High levels are closely linked to heart disease. What are the desired levels of cholesterol?  Total cholesterol below 200.  LDL below 100 for people who are at risk, below 70 for people at very high risk.  HDL above 40 is good. A level of 60 or higher is considered to be protective against heart disease.  Triglycerides below 150. How can I lower my cholesterol? Diet Follow your diet program as told by your health care provider.  Choose fish or white meat chicken and [place], roasted or baked. Limit fatty cuts of red meat, fried foods, and processed meats, such as sausage and lunch meats.  Eat lots of fresh fruits and vegetables.  Choose whole grains, beans, pasta, potatoes, and cereals.  Choose olive oil, corn  oil, or canola oil, and use only small amounts.  Avoid butter, mayonnaise, shortening, or palm kernel oils.  Avoid foods with trans fats.  Drink skim or nonfat milk and eat low-fat or nonfat yogurt and cheeses. Avoid whole milk, cream, ice cream, egg yolks, and full-fat cheeses.  Healthier desserts include angel food cake, ginger snaps, animal crackers, hard candy, popsicles, and low-fat or nonfat frozen yogurt. Avoid pastries, cakes, pies, and cookies.  Exercise  Follow your exercise program as told by your health care provider. A regular program: ? Helps to decrease LDL and raise HDL. ? Helps with weight control.  Do things that increase your activity level, such as gardening, walking, and taking the stairs.  Ask your health care provider about ways that you can be more active in your daily life.  Medicine  Take over-the-counter and prescription medicines only as told by your health care provider. ? Medicine may be prescribed by your health care provider to help lower cholesterol and decrease the risk for heart disease. This is usually done if diet and exercise have failed to bring down cholesterol levels. ? If you have several risk factors, you may need medicine even if your levels are normal.  This information is not intended to replace advice given to you by your health care provider. Make sure you discuss any questions you have with your health care provider. Document Released: 01/12/2001 Document Revised: 11/15/2015 Document Reviewed: 10/18/2015 Elsevier Interactive Patient Education  2017 [employer].  Lindsay Harrison , Thank you for taking time to come for your Medicare Wellness Visit. I appreciate your ongoing commitment to your health goals. Please review the following plan we discussed and let me know if I can assist you in the future.   These are the goals we discussed: Goals      Patient Stated   . continue being socially and physically active (pt-stated)         This is a list of the screening recommended for you and due dates:  Health Maintenance  Topic Date Due  . Mammogram  12/28/2018  . Colon Cancer Screening  03/03/2021  . Tetanus Vaccine  02/04/2025  . Flu Shot  Completed  . DEXA scan (bone density measurement)  Completed  .  Hepatitis C: One time screening is recommended by Center for Disease Control  (CDC) for  adults born from [birth year] through 1965.   Completed  . Pneumonia vaccines  Completed   Continue to eat heart healthy diet (full of fruits, vegetables, whole grains, lean protein, water--limit salt, fat, and sugar intake) and increase physical activity as tolerated.  Continue doing brain stimulating activities (puzzles, reading, adult coloring books, staying active) to keep memory sharp.

## 2017-01-31 NOTE — Assessment & Plan Note (Signed)
Taking supplements

## 2017-01-31 NOTE — Assessment & Plan Note (Deleted)
In remission. Port has been removed. She is tolerance maintenance therapy

## 2017-01-31 NOTE — Therapy (Signed)
[place], [place], [address] Phone: [phone]   Fax:  [phone]  Physical Therapy Treatment  Patient Details  Name: Lindsay Harrison MRN: [id number] Date of Birth: Jul 19, 1945 Referring Provider: Dr. Irene Limbo   Encounter Date: 01/31/2017      PT End of Session - 01/31/17 1625    Visit Number 6   Number of Visits 17   Date for PT Re-Evaluation 03/14/17   PT Start Time 1518   PT Stop Time 1614   PT Time Calculation (min) 56 min   Activity Tolerance Patient tolerated treatment well   Behavior During Therapy [facility] for tasks assessed/performed      Past Medical History:  Diagnosis Date  . Anemia    h/o iron deficiency secondary to heavy menses  . Arthritis    left hand index finger  . Diffuse large B cell lymphoma ([place])   . Grief reaction 01/27/2016  . H/O measles    as a child  . History of chicken pox    as a child  . History of PCOS   . Hyperlipidemia, mixed 01/27/2016  . Lymphadenopathy   . 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 Preventative health care 01/27/2016  . Vitamin D deficiency   . Wears glasses     Past Surgical History:  Procedure Laterality Date  . COLONOSCOPY    . IR GENERIC HISTORICAL  05/21/2016   IR FLUORO GUIDE PORT INSERTION RIGHT 05/21/2016 Arne Cleveland, MD [facility]  . IR GENERIC HISTORICAL  05/21/2016   IR US GUIDE VASC ACCESS RIGHT 05/21/2016 Arne Cleveland, MD [facility]  . IR REMOVAL TUN ACCESS W/ PORT W/O FL MOD SED  10/27/2016  . SKIN BIOPSY Left    face  . THYROGLOSSAL DUCT CYST N/A 04/16/2016   Procedure: THYROGLOSSAL DUCT CYST excision;  Surgeon: [name], MD;  Location: [place];  Service: ENT;  Laterality: N/A;    There were no vitals filed for this visit.      Subjective Assessment - 01/31/17 1524    Subjective "Even though it felt back when you were doing my shoulder stuff and it's not all gone, it's better than it was previously." My doctor  suggested I try Vick's Vaporub for my neuropathy. Hard to tell if the electrical stimulation did anything.    Pertinent History lymphoma diagnosed in December 2017, she had removeal of grown under chin, had  chemotherapy with port inserted( Jan)  and removed ( July)  will have ongoing chemo every 60 day for 2 years    Currently in Pain? Yes   Pain Score 5    Pain Location Shoulder  and neck   Pain Orientation Right   Aggravating Factors  for neck, a little too much stretching            [facility] PT Assessment - 01/31/17 0001      Assessment   Medical Diagnosis                       [facility] Adult PT Treatment/Exercise - 01/31/17 0001      Neuro Re-ed    Neuro Re-ed Details  Standing on [employer] in corner with eyes closed; then marching, then marching with arms swings; tandem stance then with eyes closed.  On treadmill for use of handlebars, stand on BOSU ball and release arm support, done on both sides of BOSU; with flat side down, step ups forward onto BOSU with minimal to  no hand support. Stand on one foot and lean forward to touch spot on floor with opposite hand x 5 each foot.  right shoulder discomfort with swinging right arm     Manual Therapy   Scapular Mobilization in left sidelying, movement of right scapula into protraction and depression   Other Manual Therapy in left sidelying, trigger point releases all around perimeter of right scapula; soft tissue work at right upper back                   [place] - 01/28/17 0939      CC Short Term Goal  #1   Title Pt will report the pain in her right shoulder has decreased by 50% so that she is able to perform her daily tasks easier   Baseline 25% as of 01/28/17   Status Partially Met     CC Short Term Goal  #2   Title Pt will be independent in home program for posture and shoulder strength and ROM    Status Partially [facility] Goals - 01/12/17 1602      CC Long  Term Goal  #1   Title Pt will have increase in miniBEST score to 25/28    Baseline 22/28   Time 8   Period Weeks   Status New     CC Long Term Goal  #2   Title Pt will be independent in home program for balance and general strength   Time 8   Period Weeks   Status New     CC Long Term Goal  #3   Title Pt will increase gait velocity to 1.5 m/sec   Baseline 1.37 m/sec   Time 8   Period Weeks   Status New     CC Long Term Goal  #4   Title Pt will report she is able to return to her community exercise activities    Time 8   Period Weeks   Status New            Plan - 01/31/17 1625    Clinical Impression Statement Pt. did surprisingly well with balance challenges.  She would benefit from continued work on high level activities such as golfer's lift-type move without UE support and moving with head turns.  Still with trigger points all around right scapula; she did feel that trigger point release last time, though uncomfortable, helped later one.   Rehab Potential Good   Clinical Impairments Affecting Rehab Potential CIPN    PT Frequency 2x / week   PT Duration 8 weeks   PT Treatment/Interventions ADLs/Self Care Home Management;Patient/family education;Gait training;Functional mobility training;Manual techniques;Therapeutic activities;Electrical Stimulation;Therapeutic exercise;Balance training;Neuromuscular re-education;Passive range of motion;Scar mobilization   PT Next Visit Plan Ask patient whether Vaporub helped her neuropathy symptoms.  Continue higher level balance challenges and right shoulder work. Possibly try estim to feet again, or low back.   Consulted and Agree with Plan of Care Patient      Patient will benefit from skilled therapeutic intervention in order to improve the following deficits and impairments:  Decreased endurance, Abnormal gait, Impaired sensation, Decreased scar mobility, Decreased knowledge of precautions, Decreased activity tolerance, Decreased  strength, Increased fascial restricitons, Impaired UE functional use, Pain, Increased muscle spasms, Difficulty walking, Decreased balance, Decreased range of motion, Impaired perceived functional ability, Postural dysfunction  Visit Diagnosis: Abnormal posture  Acute pain of right  shoulder  Stiffness of right shoulder, not elsewhere classified  Unspecified disturbances of skin sensation  Muscle weakness (generalized)  Difficulty in walking     Problem List Patient Active Problem List   Diagnosis Date Noted  . 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 NHL (non-Hodgkin's lymphoma) ([place]) 10/18/2016  . Hypersensitivity reaction 05/24/2016  . Hyperlipidemia, mixed 01/27/2016  . Grief reaction 01/27/2016  . Preventative health care 01/27/2016  . Anemia 01/27/2016  . History of colonic polyps 01/27/2016  . Actinic keratoses 01/27/2016  . History of chicken pox   . Vitamin D deficiency   . Diffuse large B cell lymphoma ([place])   . History of PCOS     SALISBURY,DONNA 01/31/2017, 4:29 PM  [place], [place], [address] Phone: [phone]   Fax:  [phone]  Name: Lindsay Harrison MRN: [id number] Date of Birth: [birth year]  Serafina Royals, PT 01/31/17 4:29 PM

## 2017-01-31 NOTE — Assessment & Plan Note (Signed)
Encouraged heart healthy diet, increase exercise, avoid trans fats, consider a krill oil cap daily 

## 2017-01-31 NOTE — Progress Notes (Signed)
[name], female   DOB: 1945/12/29, 71 y.o.   MRN: [id number]   Subjective:    [name], female    DOB: [birth year], 71 y.o.   MRN: [id number]  Chief Complaint  Patient presents with  . Follow-up  . Lymphoma  . Medicare Wellness    HPI Patient is in today for follow up and she is struggling with a grief reaction secondary to husband passing away 5 weeks ago. She is starting counseling with Hospice. No recent febrile illness or hospitalizations. Denies CP/palp/SOB/HA/congestion/fevers/GI or GU c/o. Taking meds as prescribed. Her friends and family have been very supportive but she is struggling with frequent tearfulness and difficulty concentrating.  Past Medical History:  Diagnosis Date  . Anemia    h/o iron deficiency secondary to heavy menses  . Arthritis    left hand index finger  . Diffuse large B cell lymphoma ([place])   . Grief reaction 01/27/2016  . H/O measles    as a child  . History of chicken pox    as a child  . History of PCOS   . Hyperlipidemia, mixed 01/27/2016  . Lymphadenopathy   . 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 Preventative health care 01/27/2016  . Vitamin D deficiency   . Wears glasses     Past Surgical History:  Procedure Laterality Date  . COLONOSCOPY    . IR GENERIC HISTORICAL  05/21/2016   IR FLUORO GUIDE PORT INSERTION RIGHT 05/21/2016 Arne Cleveland, MD [facility]  . IR GENERIC HISTORICAL  05/21/2016   IR US GUIDE VASC ACCESS RIGHT 05/21/2016 Arne Cleveland, MD [facility]  . IR REMOVAL TUN ACCESS W/ PORT W/O FL MOD SED  10/27/2016  . SKIN BIOPSY Left    face  . THYROGLOSSAL DUCT CYST N/A 04/16/2016   Procedure: THYROGLOSSAL DUCT CYST excision;  Surgeon: [name], MD;  Location: [facility];  Service: ENT;  Laterality: N/A;    Family History  Problem Relation Age of Onset  . Arthritis Mother        rheumatoid  . Heart disease Father        CHF  . COPD Sister        Emphysema, h/o cigarettes  .  Other Sister        pituatary tumor  . Arthritis Sister   . Obesity Sister   . Cancer Paternal Grandfather        cancer  . Stroke Maternal Aunt     Social History   Social History  . Marital status: Married    Spouse name: N/A  . Number of children: N/A  . Years of education: N/A   Occupational History  . Psychotherapist    Social History Main Topics  . Smoking status: Never Smoker  . Smokeless tobacco: Never Used  . Alcohol use 1.8 - 3.6 oz/week    3 - 6 Standard drinks or equivalent per week     Comment: 5 glasses of wine a week.  . Drug use: No  . Sexual activity: No   Other Topics Concern  . Not on file   Social History Narrative   Married. Education: [employer]. Exercise: walking, yoga, and bicycling daily for 1-2 hours. Lives alone, works as psychotherapist    Outpatient Medications Prior to Visit  Medication Sig Dispense Refill  . Calcium-Magnesium-Vitamin D (CALCIUM MAGNESIUM PO) Take 1 tablet by mouth daily.    . Cholecalciferol (VITAMIN D) 2000 units CAPS Take 2,000 Units by mouth daily.    [name]  Omega-3 Fatty Acids (OMEGA-3 PO) Take 2 capsules by mouth daily. Omega 3 1280 mg    . dexamethasone (DECADRON) 4 MG tablet TAKE 3 TABLETS BY MOUTH TWICE DAILY, TAKE 1 DOSE IN THE MORNING AND 1 DOSE IN THE EVENING THE DAY PRIOR TO Rituxan (Patient not taking: Reported on 01/31/2017) 12 tablet 6  . aspirin 81 MG tablet Take 81 mg by mouth daily. Has stopped prior to procedure    . chlorhexidine (PERIDEX) 0.12 % solution Use as directed 15 mLs in the mouth or throat 2 (two) times daily. (Patient not taking: Reported on 01/12/2017) 473 mL 0  . Vitamin D, Ergocalciferol, (DRISDOL) 50000 units CAPS capsule TAKE 1 CAPSULE BY MOUTH 1 TIME A WEEK (Patient not taking: Reported on 01/12/2017) 4 capsule 0   No facility-administered medications prior to visit.     Allergies  Allergen Reactions  . Erythromycin Other (See Comments)    Patient states that she passed out while using this  medication ? SYNCOPE ?  [name] Other Anaphylaxis    # # # CATS # # #    Review of Systems  Constitutional: Negative for fever and malaise/fatigue.  HENT: Negative for congestion.   Eyes: Negative for blurred vision.  Respiratory: Negative for shortness of breath.   Cardiovascular: Negative for chest pain, palpitations and leg swelling.  Gastrointestinal: Negative for abdominal pain, blood in stool and nausea.  Genitourinary: Negative for dysuria and frequency.  Musculoskeletal: Negative for falls.  Skin: Negative for rash.  Neurological: Positive for sensory change. Negative for dizziness, loss of consciousness and headaches.  Endo/Heme/Allergies: Negative for environmental allergies.  Psychiatric/Behavioral: Positive for depression. The patient is not nervous/anxious.        Objective:    Physical Exam  Constitutional: She is oriented to person, place, and time. She appears well-developed and well-nourished. No distress.  HENT:  Head: Normocephalic and atraumatic.  Nose: Nose normal.  Eyes: Right eye exhibits no discharge. Left eye exhibits no discharge.  Neck: Normal range of motion. Neck supple.  Cardiovascular: Normal rate and regular rhythm.   No murmur heard. Pulmonary/Chest: Effort normal and breath sounds normal.  Abdominal: Soft. Bowel sounds are normal. There is no tenderness.  Musculoskeletal: She exhibits no edema.  Neurological: She is alert and oriented to person, place, and time.  Skin: Skin is warm and dry.  Psychiatric: She has a normal mood and affect.  Nursing note and vitals reviewed.   BP 139/72   Pulse (!) 57   Temp 98.2 F (36.8 C) (Oral)   Resp 18   Ht 5' 8.6" (1.742 m)   Wt 161 lb (73 kg)   SpO2 99%   BMI 24.05 kg/m  Wt Readings from Last 3 Encounters:  01/31/17 161 lb (73 kg)  01/10/17 165 lb 9.6 oz (75.1 kg)  10/18/16 162 lb 11.2 oz (73.8 kg)     Lab Results  Component Value Date   WBC 8.4 01/10/2017   HGB 12.9 01/10/2017   HCT 38.2  01/10/2017   PLT 245 01/10/2017   GLUCOSE 94 01/31/2017   CHOL 241 (H) 01/31/2017   TRIG 63.0 01/31/2017   HDL 75.30 01/31/2017   LDLCALC 153 (H) 01/31/2017   ALT 21 01/31/2017   AST 18 01/31/2017   NA 140 01/31/2017   K 4.3 01/31/2017   CL 104 01/31/2017   CREATININE 0.54 01/31/2017   BUN 12 01/31/2017   CO2 29 01/31/2017   TSH 1.52 01/27/2016   INR 1.00 10/27/2016  HGBA1C 5.6 01/31/2017    Lab Results  Component Value Date   TSH 1.52 01/27/2016   Lab Results  Component Value Date   WBC 8.4 01/10/2017   HGB 12.9 01/10/2017   HCT 38.2 01/10/2017   MCV 92.0 01/10/2017   PLT 245 01/10/2017   Lab Results  Component Value Date   NA 140 01/31/2017   K 4.3 01/31/2017   CHLORIDE 105 01/10/2017   CO2 29 01/31/2017   GLUCOSE 94 01/31/2017   BUN 12 01/31/2017   CREATININE 0.54 01/31/2017   BILITOT 0.6 01/31/2017   ALKPHOS 117 01/31/2017   AST 18 01/31/2017   ALT 21 01/31/2017   PROT 6.4 01/31/2017   ALBUMIN 4.3 01/31/2017   CALCIUM 9.6 01/31/2017   ANIONGAP 12 (H) 01/10/2017   EGFR 87 (L) 01/10/2017   GFR 118.33 01/31/2017   Lab Results  Component Value Date   CHOL 241 (H) 01/31/2017   Lab Results  Component Value Date   HDL 75.30 01/31/2017   Lab Results  Component Value Date   LDLCALC 153 (H) 01/31/2017   Lab Results  Component Value Date   TRIG 63.0 01/31/2017   Lab Results  Component Value Date   CHOLHDL 3 01/31/2017   Lab Results  Component Value Date   HGBA1C 5.6 01/31/2017       Assessment & Plan:   Problem List Items Addressed This Visit    Hyperlipidemia, mixed    Encouraged heart healthy diet, increase exercise, avoid trans fats, consider a krill oil cap daily      Relevant Orders   Lipid panel (Completed)   Vitamin D deficiency    Taking supplements.      Relevant Orders   VITAMIN D 25 Hydroxy (Vit-D Deficiency, Fractures) (Completed)   Diffuse large B cell lymphoma ([place])    In remission. Port has been removed. She is  tolerance maintenance therapy      Grief reaction    Husband died roughly 5 weeks ago after an 8 year battle with Glioblastoma. Still grieving      NHL (non-Hodgkin's lymphoma) ([facility])   Peripheral neuropathy    Try a menthol rub qhs and notify us if worsens       Other Visit Diagnoses    Hyperglycemia    -  Primary   Relevant Orders   Hemoglobin A1c (Completed)   Comprehensive metabolic panel (Completed)   Encounter for Medicare annual wellness exam          I have discontinued Ms. Rena's aspirin, Vitamin D (Ergocalciferol), and chlorhexidine. I am also having her maintain her Omega-3 Fatty Acids (OMEGA-3 PO), Calcium-Magnesium-Vitamin D (CALCIUM MAGNESIUM PO), Vitamin D, and dexamethasone.  No orders of the defined types were placed in this encounter.    Penni Homans, MD

## 2017-01-31 NOTE — Assessment & Plan Note (Signed)
Try a menthol rub qhs and notify us if worsens

## 2017-02-02 ENCOUNTER — Ambulatory Visit: Payer: Medicare Other | Admitting: Physical Therapy

## 2017-02-02 DIAGNOSIS — R293 Abnormal posture: Secondary | ICD-10-CM

## 2017-02-02 DIAGNOSIS — M25611 Stiffness of right shoulder, not elsewhere classified: Secondary | ICD-10-CM | POA: Diagnosis not present

## 2017-02-02 DIAGNOSIS — R209 Unspecified disturbances of skin sensation: Secondary | ICD-10-CM | POA: Diagnosis not present

## 2017-02-02 DIAGNOSIS — M25511 Pain in right shoulder: Secondary | ICD-10-CM

## 2017-02-02 DIAGNOSIS — M6281 Muscle weakness (generalized): Secondary | ICD-10-CM | POA: Diagnosis not present

## 2017-02-02 DIAGNOSIS — R262 Difficulty in walking, not elsewhere classified: Secondary | ICD-10-CM | POA: Diagnosis not present

## 2017-02-02 NOTE — Therapy (Signed)
[place], [place], [address] Phone: [phone]   Fax:  [phone]  Physical Therapy Treatment  Patient Details  Name: Lindsay Harrison MRN: [id number] Date of Birth: [birth year] Referring Provider: Dr. Irene Limbo   Encounter Date: 02/02/2017      PT End of Session - 02/02/17 1447    Visit Number 7   Number of Visits 17   Date for PT Re-Evaluation 03/14/17   PT Start Time 1347   PT Stop Time 1434   PT Time Calculation (min) 47 min   Activity Tolerance Patient tolerated treatment well   Behavior During Therapy [facility] for tasks assessed/performed      Past Medical History:  Diagnosis Date  . Anemia    h/o iron deficiency secondary to heavy menses  . Arthritis    left hand index finger  . Diffuse large B cell lymphoma ([place])   . Grief reaction 01/27/2016  . H/O measles    as a child  . History of chicken pox    as a child  . History of PCOS   . Hyperlipidemia, mixed 01/27/2016  . Lymphadenopathy   . 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 Preventative health care 01/27/2016  . Vitamin D deficiency   . Wears glasses     Past Surgical History:  Procedure Laterality Date  . COLONOSCOPY    . IR GENERIC HISTORICAL  05/21/2016   IR FLUORO GUIDE PORT INSERTION RIGHT 05/21/2016 Arne Cleveland, MD [facility]  . IR GENERIC HISTORICAL  05/21/2016   IR US GUIDE VASC ACCESS RIGHT 05/21/2016 Arne Cleveland, MD [facility]  . IR REMOVAL TUN ACCESS W/ PORT W/O FL MOD SED  10/27/2016  . SKIN BIOPSY Left    face  . THYROGLOSSAL DUCT CYST N/A 04/16/2016   Procedure: THYROGLOSSAL DUCT CYST excision;  Surgeon: [name], MD;  Location: [place];  Service: ENT;  Laterality: N/A;    There were no vitals filed for this visit.      Subjective Assessment - 02/02/17 1349    Subjective I didn't do the Vick's Vaporub thing yet because I had to go get some.    Currently in Pain? Yes   Pain Score 4    Pain Location  Shoulder   Pain Orientation Right   Pain Descriptors / Indicators Aching   Aggravating Factors  sleeping on right side, possibly spending time pruning bushes   Pain Relieving Factors therapy helps--trigger point release                          [facility] Adult PT Treatment/Exercise - 02/02/17 0001      Shoulder Exercises: Supine   Horizontal ABduction Strengthening;Both;5 reps;Theraband   Theraband Level (Shoulder Horizontal ABduction) Level 2 (Red)   External Rotation Strengthening;Both;5 reps;Theraband   Theraband Level (Shoulder External Rotation) Level 2 (Red)   Flexion AROM;Both;5 reps;Theraband  narrow and wide grip   Theraband Level (Shoulder Flexion) Level 2 (Red)   Flexion Limitations advised to stay below painful range   Other Supine Exercises active D2 vs. red Theraband x 5 each side  more limited motion on the right     Manual Therapy   Other Manual Therapy in left sidelying, trigger point releases at lateral border of right scapula near axilla and at right upper trap; soft tissue work at right upper back                PT Education -  02/02/17 1447    Education provided Yes   Education Details supine scapular series vs. red Theraband   Person(s) Educated Patient   Methods Explanation;Verbal cues;Handout;Tactile cues   Comprehension Verbalized understanding;Returned demonstration           [facility] Goals - 01/28/17 0939      CC Short Term Goal  #1   Title Pt will report the pain in her right shoulder has decreased by 50% so that she is able to perform her daily tasks easier   Baseline 25% as of 01/28/17   Status Partially Met     CC Short Term Goal  #2   Title Pt will be independent in home program for posture and shoulder strength and ROM    Status Partially [facility] Goals - 01/12/17 1602      CC Long Term Goal  #1   Title Pt will have increase in miniBEST score to 25/28    Baseline 22/28    Time 8   Period Weeks   Status New     CC Long Term Goal  #2   Title Pt will be independent in home program for balance and general strength   Time 8   Period Weeks   Status New     CC Long Term Goal  #3   Title Pt will increase gait velocity to 1.5 m/sec   Baseline 1.37 m/sec   Time 8   Period Weeks   Status New     CC Long Term Goal  #4   Title Pt will report she is able to return to her community exercise activities    Time 8   Period Weeks   Status New            Plan - 02/02/17 1447    Clinical Impression Statement Pt. was today encouraged to continue her yoga, given that her balance is fairly good; we can do some higher level balance activities.  She would like to strengthen UEs.  She was taught supine scapular series vs. red Theraband today and issued Theraband and HEP handout.  Continued soft tissue work to right upper back; she seemed to have fewer trigger points today.   Rehab Potential Good   Clinical Impairments Affecting Rehab Potential CIPN    PT Frequency 2x / week   PT Duration 8 weeks   PT Treatment/Interventions ADLs/Self Care Home Management;Patient/family education;Gait training;Functional mobility training;Manual techniques;Therapeutic activities;Electrical Stimulation;Therapeutic exercise;Balance training;Neuromuscular re-education;Passive range of motion;Scar mobilization   PT Next Visit Plan Continue UE strengthening and soft tissue work to right upper back. Do some higher level balance challenges. Possibly try estim to feet again, or low back.   Consulted and Agree with Plan of Care Patient      Patient will benefit from skilled therapeutic intervention in order to improve the following deficits and impairments:  Decreased endurance, Abnormal gait, Impaired sensation, Decreased scar mobility, Decreased knowledge of precautions, Decreased activity tolerance, Decreased strength, Increased fascial restricitons, Impaired UE functional use, Pain, Increased  muscle spasms, Difficulty walking, Decreased balance, Decreased range of motion, Impaired perceived functional ability, Postural dysfunction  Visit Diagnosis: Abnormal posture  Acute pain of right shoulder  Stiffness of right shoulder, not elsewhere classified  Unspecified disturbances of skin sensation  Muscle weakness (generalized)     Problem List Patient Active Problem List   Diagnosis Date Noted  . 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 NHL (non-Hodgkin's lymphoma) ([place]) 10/18/2016  . Hypersensitivity reaction 05/24/2016  . Hyperlipidemia, mixed 01/27/2016  . Grief reaction 01/27/2016  . Preventative health care 01/27/2016  . Anemia 01/27/2016  . History of colonic polyps 01/27/2016  . Actinic keratoses 01/27/2016  . History of chicken pox   . Vitamin D deficiency   . Diffuse large B cell lymphoma ([place])   . History of PCOS     Lindsay Harrison 02/02/2017, 2:51 PM  [place]say Harrison, [place], [address] Phone: [phone]   Fax:  [phone]  Name: Lindsay Harrison MRN: [id number] Date of Birth: April 24, 1946  Lindsay Harrison, PT 02/02/17 2:52 PM

## 2017-02-02 NOTE — Patient Instructions (Signed)

## 2017-02-04 ENCOUNTER — Encounter: Payer: Self-pay | Admitting: Family Medicine

## 2017-02-07 ENCOUNTER — Ambulatory Visit: Payer: Medicare Other | Admitting: Physical Therapy

## 2017-02-07 DIAGNOSIS — M6281 Muscle weakness (generalized): Secondary | ICD-10-CM | POA: Diagnosis not present

## 2017-02-07 DIAGNOSIS — R262 Difficulty in walking, not elsewhere classified: Secondary | ICD-10-CM | POA: Diagnosis not present

## 2017-02-07 DIAGNOSIS — M25511 Pain in right shoulder: Secondary | ICD-10-CM

## 2017-02-07 DIAGNOSIS — R209 Unspecified disturbances of skin sensation: Secondary | ICD-10-CM

## 2017-02-07 DIAGNOSIS — M25611 Stiffness of right shoulder, not elsewhere classified: Secondary | ICD-10-CM | POA: Diagnosis not present

## 2017-02-07 DIAGNOSIS — R293 Abnormal posture: Secondary | ICD-10-CM | POA: Diagnosis not present

## 2017-02-07 NOTE — Therapy (Signed)
[place], [place], [address] Phone: [phone]   Fax:  [phone]  Physical Therapy Treatment  Patient Details  Name: Lindsay Harrison MRN: [id number] Date of Birth: 1946-03-21 Referring Provider: Dr. Irene Limbo   Encounter Date: 02/07/2017      PT End of Session - 02/07/17 1713    Visit Number 8   Number of Visits 17   Date for PT Re-Evaluation 03/14/17   PT Start Time 9983   PT Stop Time 1436   PT Time Calculation (min) 47 min   Activity Tolerance Patient tolerated treatment well   Behavior During Therapy [facility] for tasks assessed/performed      Past Medical History:  Diagnosis Date  . Anemia    h/o iron deficiency secondary to heavy menses  . Arthritis    left hand index finger  . Diffuse large B cell lymphoma ([place])   . Grief reaction 01/27/2016  . H/O measles    as a child  . History of chicken pox    as a child  . History of PCOS   . Hyperlipidemia, mixed 01/27/2016  . Lymphadenopathy   . 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 Preventative health care 01/27/2016  . Vitamin D deficiency   . Wears glasses     Past Surgical History:  Procedure Laterality Date  . COLONOSCOPY    . IR GENERIC HISTORICAL  05/21/2016   IR FLUORO GUIDE PORT INSERTION RIGHT 05/21/2016 Arne Cleveland, MD [facility]  . IR GENERIC HISTORICAL  05/21/2016   IR US GUIDE VASC ACCESS RIGHT 05/21/2016 Arne Cleveland, MD [facility]  . IR REMOVAL TUN ACCESS W/ PORT W/O FL MOD SED  10/27/2016  . SKIN BIOPSY Left    face  . THYROGLOSSAL DUCT CYST N/A 04/16/2016   Procedure: THYROGLOSSAL DUCT CYST excision;  Surgeon: [name], MD;  Location: [place];  Service: ENT;  Laterality: N/A;    There were no vitals filed for this visit.      Subjective Assessment - 02/07/17 1350    Subjective "The last two nights I had a heck of a time sleeping with my shoulder.  I may have gotten carried away with my exercises." --It was  okay when I was up walking around, and when biking and when mowing the grass. Thursday and Friday the arm bothered during the day   Pertinent History lymphoma diagnosed in December 2017, she had removeal of grown under chin, had  chemotherapy with port inserted( Jan)  and removed ( July)  will have ongoing chemo every 60 day for 2 years    Currently in Pain? No/denies                         [facility] Adult PT Treatment/Exercise - 02/07/17 0001      Ambulation/Gait   Gait Comments two gait speed measures done, one in 6 seconds, one in 5 seconds     Neuro Re-ed    Neuro Re-ed Details  Stand on one foot and do golfer's lift, but without UE support, to pick up tape measure and then set it down again, 4 times each side; foot taps on 8 inch step, alternating x 10 each side (easy); standing on one foot, yellow Theraband around pole, resisted repeated hip abduction trying not to set foot down between reps, 10 times each side; same thing with hip adduction. Stand on BOSU ball in treadmill for handles of treadmill, trying to  balance without UE support; seated on BOSU ball, pick feet up off floor with knees bent and try to balance without UE support, then [place] twist attempt in this position.     Exercises   Other Exercises  Advised patient to stop active D2 with Theraband for Rt. UE, and any exercise that aggravates right shoulder.     Shoulder Exercises: Supine   Other Supine Exercises Pt. was advised to leave right D2 off her home exercise program list for now, to see if she has less discomfort as a result     Manual Therapy   Other Manual Therapy In sitting, soft tissue work to upper back with focus on right side.  significant tightness palpated                   [facility] Goals - 01/28/17 0939      CC Short Term Goal  #1   Title Pt will report the pain in her right shoulder has decreased by 50% so that she is able to perform her daily tasks easier   Baseline  25% as of 01/28/17   Status Partially Met     CC Short Term Goal  #2   Title Pt will be independent in home program for posture and shoulder strength and ROM    Status Partially [facility] Goals - 01/12/17 1602      CC Long Term Goal  #1   Title Pt will have increase in miniBEST score to 25/28    Baseline 22/28   Time 8   Period Weeks   Status New     CC Long Term Goal  #2   Title Pt will be independent in home program for balance and general strength   Time 8   Period Weeks   Status New     CC Long Term Goal  #3   Title Pt will increase gait velocity to 1.5 m/sec   Baseline 1.37 m/sec   Time 8   Period Weeks   Status New     CC Long Term Goal  #4   Title Pt will report she is able to return to her community exercise activities    Time 8   Period Weeks   Status New            Plan - 02/07/17 1713    Clinical Impression Statement Pt. continues to have right shoulder pain, such that it interfered with her sleep the last two nights.  She has been trying to figure out the cause of this.  For now, she is to stop active D2 with Theraband for right UE and any other exercise that aggravates right shoulder. Worked on balance today, and several things done were challenging to patient.   Rehab Potential Good   Clinical Impairments Affecting Rehab Potential CIPN    PT Frequency 2x / week   PT Duration 8 weeks   PT Treatment/Interventions ADLs/Self Care Home Management;Patient/family education;Gait training;Functional mobility training;Manual techniques;Therapeutic activities;Electrical Stimulation;Therapeutic exercise;Balance training;Neuromuscular re-education;Passive range of motion;Scar mobilization   PT Next Visit Plan Continue UE strengthening and soft tissue work to right upper back. Do some higher level balance challenges. Possibly try estim to feet again, or low back.   Consulted and Agree with Plan of Care Patient      Patient will benefit  from skilled therapeutic intervention in order to improve the following  deficits and impairments:  Decreased endurance, Abnormal gait, Impaired sensation, Decreased scar mobility, Decreased knowledge of precautions, Decreased activity tolerance, Decreased strength, Increased fascial restricitons, Impaired UE functional use, Pain, Increased muscle spasms, Difficulty walking, Decreased balance, Decreased range of motion, Impaired perceived functional ability, Postural dysfunction  Visit Diagnosis: Abnormal posture  Acute pain of right shoulder  Stiffness of right shoulder, not elsewhere classified  Unspecified disturbances of skin sensation     Problem List Patient Active Problem List   Diagnosis Date Noted  . 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 NHL (non-Hodgkin's lymphoma) ([place]) 10/18/2016  . Hypersensitivity reaction 05/24/2016  . Hyperlipidemia, mixed 01/27/2016  . Grief reaction 01/27/2016  . Preventative health care 01/27/2016  . Anemia 01/27/2016  . History of colonic polyps 01/27/2016  . Actinic keratoses 01/27/2016  . History of chicken pox   . Vitamin D deficiency   . Diffuse large B cell lymphoma ([place])   . History of PCOS     Kaisen Ackers 02/07/2017, 5:16 PM  [place], [place], [address] Phone: [phone]   Fax:  [phone]  Name: Tayten Heber MRN: [id number] Date of Birth: Jan 02, 1946  Serafina Royals, PT 02/07/17 5:16 PM

## 2017-02-09 ENCOUNTER — Ambulatory Visit: Payer: Medicare Other | Admitting: Physical Therapy

## 2017-02-09 DIAGNOSIS — M25611 Stiffness of right shoulder, not elsewhere classified: Secondary | ICD-10-CM

## 2017-02-09 DIAGNOSIS — R293 Abnormal posture: Secondary | ICD-10-CM

## 2017-02-09 DIAGNOSIS — M6281 Muscle weakness (generalized): Secondary | ICD-10-CM

## 2017-02-09 DIAGNOSIS — R262 Difficulty in walking, not elsewhere classified: Secondary | ICD-10-CM | POA: Diagnosis not present

## 2017-02-09 DIAGNOSIS — M25511 Pain in right shoulder: Secondary | ICD-10-CM | POA: Diagnosis not present

## 2017-02-09 DIAGNOSIS — R209 Unspecified disturbances of skin sensation: Secondary | ICD-10-CM

## 2017-02-09 NOTE — Patient Instructions (Signed)

## 2017-02-09 NOTE — Therapy (Signed)
[place], [place], [address] Phone: [phone]   Fax:  [phone]  Physical Therapy Treatment  Patient Details  Name: Lindsay Harrison MRN: [id number] Date of Birth: 1945-06-04 Referring Provider: Dr. Irene Limbo   Encounter Date: 02/09/2017      PT End of Session - 02/09/17 1259    Visit Number 9   Number of Visits 17   Date for PT Re-Evaluation 03/14/17   PT Start Time 0845   PT Stop Time 0930   PT Time Calculation (min) 45 min   Activity Tolerance Patient tolerated treatment well   Behavior During Therapy [facility] for tasks assessed/performed      Past Medical History:  Diagnosis Date  . Anemia    h/o iron deficiency secondary to heavy menses  . Arthritis    left hand index finger  . Diffuse large B cell lymphoma ([place])   . Grief reaction 01/27/2016  . H/O measles    as a child  . History of chicken pox    as a child  . History of PCOS   . Hyperlipidemia, mixed 01/27/2016  . Lymphadenopathy   . 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 Preventative health care 01/27/2016  . Vitamin D deficiency   . Wears glasses     Past Surgical History:  Procedure Laterality Date  . COLONOSCOPY    . IR GENERIC HISTORICAL  05/21/2016   IR FLUORO GUIDE PORT INSERTION RIGHT 05/21/2016 Arne Cleveland, MD [facility]  . IR GENERIC HISTORICAL  05/21/2016   IR US GUIDE VASC ACCESS RIGHT 05/21/2016 Arne Cleveland, MD [facility]  . IR REMOVAL TUN ACCESS W/ PORT W/O FL MOD SED  10/27/2016  . SKIN BIOPSY Left    face  . THYROGLOSSAL DUCT CYST N/A 04/16/2016   Procedure: THYROGLOSSAL DUCT CYST excision;  Surgeon: [name], MD;  Location: [place];  Service: ENT;  Laterality: N/A;    There were no vitals filed for this visit.      Subjective Assessment - 02/09/17 0903    Subjective pt says she has not been sleeping well the last few nights.  Her right shoulder wakes her up at night when she is lying on it.  She  has started going back to the private yoga studio one time week    Pertinent History lymphoma diagnosed in December 2017, she had removeal of growth under chin, had  chemotherapy with port inserted( Jan)  and removed ( July)  will have ongoing chemo every 60 day for 2 years    Currently in Pain? Yes   Pain Score 7    Pain Location Shoulder   Pain Orientation Right;Anterior   Pain Descriptors / Indicators Aching   Pain Radiating Towards toward the chest and toward back of shoulder, sometimes has pain in the middle and ring finger .    Pain Onset More than a month ago   Pain Frequency Intermittent   Aggravating Factors  sleeping on right side                          [facility] Adult PT Treatment/Exercise - 02/09/17 0001      Self-Care   Other Self-Care Comments  discused various yoga poses with modification to avoid overhead arm positons and work on chest openers Discussed different positions of pillows to take pressure off of right shoulder when sleeping      Shoulder Exercises: Supine   Other Supine  Exercises Meeks decompression 5 reps in supine      Manual Therapy   Manual Therapy Joint mobilization;Myofascial release;Manual Traction   Joint Mobilization clavicular mobilization with pec minor prolonged stretch    Soft tissue mobilization with diluted biofreeze/biotone to tight trigger points    Manual Traction gentle manual cervical traction with myofascial release perceived                 PT Education - 02/09/17 1259    Education provided Yes   Education Details reviewed meeks decompression    Person(s) Educated Patient   Methods Explanation;Demonstration   Comprehension Verbalized understanding;Returned demonstration           Short Term Clinic Goals - 01/28/17 [phone]      CC Short Term Goal  #1   Title Pt will report the pain in her right shoulder has decreased by 50% so that she is able to perform her daily tasks easier   Baseline 25% as of  01/28/17   Status Partially Met     CC Short Term Goal  #2   Title Pt will be independent in home program for posture and shoulder strength and ROM    Status Partially [facility] Goals - 01/12/17 1602      CC Long Term Goal  #1   Title Pt will have increase in miniBEST score to 25/28    Baseline 22/28   Time 8   Period Weeks   Status New     CC Long Term Goal  #2   Title Pt will be independent in home program for balance and general strength   Time 8   Period Weeks   Status New     CC Long Term Goal  #3   Title Pt will increase gait velocity to 1.5 m/sec   Baseline 1.37 m/sec   Time 8   Period Weeks   Status New     CC Long Term Goal  #4   Title Pt will report she is able to return to her community exercise activities    Time 8   Period Weeks   Status New            Plan - 02/09/17 1259    Clinical Impression Statement Pt continues with shoulder pain with tender trigger points in neck and upper traps  Upgraded to manual cervial traction and use of diluted biofreeze to help with muscular symptoms.  Encourage pt to continue with stretching    Clinical Impairments Affecting Rehab Potential CIPN    PT Frequency 2x / week   PT Duration 8 weeks   PT Treatment/Interventions ADLs/Self Care Home Management;Patient/family education;Gait training;Functional mobility training;Manual techniques;Therapeutic activities;Electrical Stimulation;Therapeutic exercise;Balance training;Neuromuscular re-education;Passive range of motion;Scar mobilization   PT Next Visit Plan Continue UE strengthening and soft tissue work to right upper back. Do some higher level balance challenges with eyes closed on airex/ BOSU Possibly try estim to feet again, or low back.    Consulted and Agree with Plan of Care Patient      Patient will benefit from skilled therapeutic intervention in order to improve the following deficits and impairments:  Decreased endurance, Abnormal  gait, Impaired sensation, Decreased scar mobility, Decreased knowledge of precautions, Decreased activity tolerance, Decreased strength, Increased fascial restricitons, Impaired UE functional use, Pain, Increased muscle spasms, Difficulty walking, Decreased balance, Decreased range of motion, Impaired perceived functional ability, Postural  dysfunction  Visit Diagnosis: Abnormal posture  Acute pain of right shoulder  Stiffness of right shoulder, not elsewhere classified  Unspecified disturbances of skin sensation  Muscle weakness (generalized)     Problem List Patient Active Problem List   Diagnosis Date Noted  . 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 NHL (non-Hodgkin's lymphoma) ([place]) 10/18/2016  . Hypersensitivity reaction 05/24/2016  . Hyperlipidemia, mixed 01/27/2016  . Grief reaction 01/27/2016  . Preventative health care 01/27/2016  . Anemia 01/27/2016  . History of colonic polyps 01/27/2016  . Actinic keratoses 01/27/2016  . History of chicken pox   . Vitamin D deficiency   . Diffuse large B cell lymphoma ([place])   . History of PCOS    Donato Heinz. Owens Shark PT  Norwood Levo 02/09/2017, 1:03 PM  [place], [place], [address] Phone: [phone]   Fax:  [phone]  Name: Lindsay Harrison MRN: [id number] Date of Birth: 02-Oct-1945

## 2017-02-10 DIAGNOSIS — D1801 Hemangioma of skin and subcutaneous tissue: Secondary | ICD-10-CM | POA: Diagnosis not present

## 2017-02-10 DIAGNOSIS — L814 Other melanin hyperpigmentation: Secondary | ICD-10-CM | POA: Diagnosis not present

## 2017-02-10 DIAGNOSIS — Z808 Family history of malignant neoplasm of other organs or systems: Secondary | ICD-10-CM | POA: Diagnosis not present

## 2017-02-10 DIAGNOSIS — Z86018 Personal history of other benign neoplasm: Secondary | ICD-10-CM | POA: Diagnosis not present

## 2017-02-10 DIAGNOSIS — L821 Other seborrheic keratosis: Secondary | ICD-10-CM | POA: Diagnosis not present

## 2017-02-10 DIAGNOSIS — D225 Melanocytic nevi of trunk: Secondary | ICD-10-CM | POA: Diagnosis not present

## 2017-02-15 ENCOUNTER — Ambulatory Visit: Payer: Medicare Other | Admitting: Physical Therapy

## 2017-02-15 DIAGNOSIS — R209 Unspecified disturbances of skin sensation: Secondary | ICD-10-CM

## 2017-02-15 DIAGNOSIS — R293 Abnormal posture: Secondary | ICD-10-CM

## 2017-02-15 DIAGNOSIS — M25611 Stiffness of right shoulder, not elsewhere classified: Secondary | ICD-10-CM

## 2017-02-15 DIAGNOSIS — R262 Difficulty in walking, not elsewhere classified: Secondary | ICD-10-CM | POA: Diagnosis not present

## 2017-02-15 DIAGNOSIS — M6281 Muscle weakness (generalized): Secondary | ICD-10-CM | POA: Diagnosis not present

## 2017-02-15 DIAGNOSIS — M25511 Pain in right shoulder: Secondary | ICD-10-CM
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
## 2017-02-18 ENCOUNTER — Encounter: Payer: Medicare Other | Admitting: Physical Therapy

## 2017-02-22 ENCOUNTER — Ambulatory Visit: Payer: Medicare Other | Admitting: Physical Therapy

## 2017-02-22 DIAGNOSIS — R209 Unspecified disturbances of skin sensation: Secondary | ICD-10-CM | POA: Diagnosis not present

## 2017-02-22 DIAGNOSIS — M6281 Muscle weakness (generalized): Secondary | ICD-10-CM

## 2017-02-22 DIAGNOSIS — M25611 Stiffness of right shoulder, not elsewhere classified: Secondary | ICD-10-CM

## 2017-02-22 DIAGNOSIS — R293 Abnormal posture: Secondary | ICD-10-CM | POA: Diagnosis not present

## 2017-02-22 DIAGNOSIS — M25511 Pain in right shoulder: Secondary | ICD-10-CM

## 2017-02-22 DIAGNOSIS — R262 Difficulty in walking, not elsewhere classified: Secondary | ICD-10-CM | POA: Diagnosis not present

## 2017-02-22 NOTE — Therapy (Signed)
[place], [place], [address] Phone: [phone]   Fax:  [phone]  Physical Therapy Treatment  Patient Details  Name: Lindsay Harrison MRN: [id number] Date of Birth: [birth year] Referring Provider: Dr. Irene Limbo   Encounter Date: 02/22/2017      PT End of Session - 02/22/17 1707    Visit Number 11   Number of Visits 17   Date for PT Re-Evaluation 03/14/17   PT Start Time 1302   PT Stop Time 1345   PT Time Calculation (min) 43 min   Activity Tolerance Patient tolerated treatment well   Behavior During Therapy [facility] for tasks assessed/performed      Past Medical History:  Diagnosis Date  . Anemia    h/o iron deficiency secondary to heavy menses  . Arthritis    left hand index finger  . Diffuse large B cell lymphoma ([place])   . Grief reaction 01/27/2016  . H/O measles    as a child  . History of chicken pox    as a child  . History of PCOS   . Hyperlipidemia, mixed 01/27/2016  . Lymphadenopathy   . 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 Preventative health care 01/27/2016  . Vitamin D deficiency   . Wears glasses     Past Surgical History:  Procedure Laterality Date  . COLONOSCOPY    . IR GENERIC HISTORICAL  05/21/2016   IR FLUORO GUIDE PORT INSERTION RIGHT 05/21/2016 Arne Cleveland, MD [facility]  . IR GENERIC HISTORICAL  05/21/2016   IR US GUIDE VASC ACCESS RIGHT 05/21/2016 Arne Cleveland, MD [facility]  . IR REMOVAL TUN ACCESS W/ PORT W/O FL MOD SED  10/27/2016  . SKIN BIOPSY Left    face  . THYROGLOSSAL DUCT CYST N/A 04/16/2016   Procedure: THYROGLOSSAL DUCT CYST excision;  Surgeon: [name], MD;  Location: [place];  Service: ENT;  Laterality: N/A;    There were no vitals filed for this visit.      Subjective Assessment - 02/22/17 1700    Subjective pt states she still has pain in her shoulder because she ends up sleeping on it at night.  She continues to have tender tightness in  upper traps  Pt reports she is doing yoga 1-2 times a week    Pertinent History lymphoma diagnosed in December 2017, she had removeal of growth under chin, had  chemotherapy with port inserted( Jan)  and removed ( July)  will have ongoing chemo every 60 day for 2 years    Currently in Pain? No/denies  the pain comes at night                          [facility] Adult PT Treatment/Exercise - 02/22/17 0001      Self-Care   Other Self-Care Comments  discussed pillow placement in sidelying to support rib cage and make a "trough" for shoulder and arm for sleeping      Shoulder Exercises: Standing   Row Both;10 reps;Theraband  standing low rows    Theraband Level (Shoulder Row) Level 2 (Red)     Manual Therapy   Manual Therapy Soft tissue mobilization;Myofascial release;Scapular mobilization   Soft tissue mobilization with biotone, soft tisse work to upper traps in left sidelying    Myofascial Release prolonged trigger point release in right axilla and posteror axillary region    Passive ROM to right shoulder at limits in all planes  Manual Traction gentle manual cervical traction with myofascial release perceived                    Short Term Clinic Goals - 02/15/17 1354      CC Short Term Goal  #1   Title Pt will report the pain in her right shoulder has decreased by 50% so that she is able to perform her daily tasks easier   Baseline 25% as of 01/28/17; 35% as of 02/15/17   Status Partially Met     CC Short Term Goal  #2   Title Pt will be independent in home program for posture and shoulder strength and ROM    Status Achieved             Long Term Clinic Goals - 02/15/17 1642      CC Long Term Goal  #3   Title Pt will increase gait velocity to 1.5 m/sec   Baseline 1.37 m/sec at eval; 3 m/sec on 02/07/17   Status Achieved            Plan - 02/22/17 1708    Clinical Impression Statement pt has improvment in tightness in right chest near port,  but continues to have tightness in her upper traps and axillary region that responds some to soft tissue work.  She reports she is working on exercise at home    PT Next Visit Plan Try further assessment of right shoulder, including empty can and further palpation.  Continue strengthening of that area; if she did not have increased pain from this visit, review Rockwood and assign these to HEP.       Patient will benefit from skilled therapeutic intervention in order to improve the following deficits and impairments:  Decreased endurance, Abnormal gait, Impaired sensation, Decreased scar mobility, Decreased knowledge of precautions, Decreased activity tolerance, Decreased strength, Increased fascial restricitons, Impaired UE functional use, Pain, Increased muscle spasms, Difficulty walking, Decreased balance, Decreased range of motion, Impaired perceived functional ability, Postural dysfunction  Visit Diagnosis: Abnormal posture  Acute pain of right shoulder  Stiffness of right shoulder, not elsewhere classified  Unspecified disturbances of skin sensation  Muscle weakness (generalized)     Problem List Patient Active Problem List   Diagnosis Date Noted  . 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 NHL (non-Hodgkin's lymphoma) ([place]) 10/18/2016  . Hypersensitivity reaction 05/24/2016  . Hyperlipidemia, mixed 01/27/2016  . Grief reaction 01/27/2016  . Preventative health care 01/27/2016  . Anemia 01/27/2016  . History of colonic polyps 01/27/2016  . Actinic keratoses 01/27/2016  . History of chicken pox   . Vitamin D deficiency   . Diffuse large B cell lymphoma ([place])   . History of PCOS    Donato Heinz. Owens Shark PT  Norwood Levo 02/22/2017, 5:10 PM  [place], [place], [address] Phone: [phone]   Fax:  [phone]  Name:  Nioka Thorington MRN: [id number] Date of Birth: 29-Oct-1945

## 2017-02-24 ENCOUNTER — Ambulatory Visit: Payer: Medicare Other | Admitting: Physical Therapy

## 2017-02-24 DIAGNOSIS — R209 Unspecified disturbances of skin sensation: Secondary | ICD-10-CM | POA: Diagnosis not present

## 2017-02-24 DIAGNOSIS — R293 Abnormal posture: Secondary | ICD-10-CM | POA: Diagnosis not present

## 2017-02-24 DIAGNOSIS — M25611 Stiffness of right shoulder, not elsewhere classified: Secondary | ICD-10-CM

## 2017-02-24 DIAGNOSIS — M6281 Muscle weakness (generalized): Secondary | ICD-10-CM | POA: Diagnosis not present

## 2017-02-24 DIAGNOSIS — M25511 Pain in right shoulder: Secondary | ICD-10-CM | POA: Diagnosis not present

## 2017-02-24 DIAGNOSIS — R262 Difficulty in walking, not elsewhere classified: Secondary | ICD-10-CM | POA: Diagnosis not present

## 2017-02-24 NOTE — Patient Instructions (Signed)

## 2017-02-24 NOTE — Therapy (Signed)
[place], [place], [address] Phone: [phone]   Fax:  [phone]  Physical Therapy Treatment  Patient Details  Name: Lindsay Harrison MRN: [id number] Date of Birth: 1945-09-01 Referring Provider: Dr. Irene Limbo   Encounter Date: 02/24/2017      PT End of Session - 02/24/17 1720    Visit Number 12   Number of Visits 17   Date for PT Re-Evaluation 03/14/17   PT Start Time 6962   PT Stop Time 1524   PT Time Calculation (min) 47 min   Activity Tolerance Patient tolerated treatment well   Behavior During Therapy [facility] for tasks assessed/performed      Past Medical History:  Diagnosis Date  . Anemia    h/o iron deficiency secondary to heavy menses  . Arthritis    left hand index finger  . Diffuse large B cell lymphoma ([place])   . Grief reaction 01/27/2016  . H/O measles    as a child  . History of chicken pox    as a child  . History of PCOS   . Hyperlipidemia, mixed 01/27/2016  . Lymphadenopathy   . 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 Preventative health care 01/27/2016  . Vitamin D deficiency   . Wears glasses     Past Surgical History:  Procedure Laterality Date  . COLONOSCOPY    . IR GENERIC HISTORICAL  05/21/2016   IR FLUORO GUIDE PORT INSERTION RIGHT 05/21/2016 Arne Cleveland, MD [facility]  . IR GENERIC HISTORICAL  05/21/2016   IR US GUIDE VASC ACCESS RIGHT 05/21/2016 Arne Cleveland, MD [facility]  . IR REMOVAL TUN ACCESS W/ PORT W/O FL MOD SED  10/27/2016  . SKIN BIOPSY Left    face  . THYROGLOSSAL DUCT CYST N/A 04/16/2016   Procedure: THYROGLOSSAL DUCT CYST excision;  Surgeon: [name], MD;  Location: [place];  Service: ENT;  Laterality: N/A;    There were no vitals filed for this visit.      Subjective Assessment - 02/24/17 1441    Subjective Had a rough day yesterday around her husband having passed away two months ago, so didn't do exercise.  Did try the pillows to  support her at night, and that helped.   Pertinent History lymphoma diagnosed in December 2017, she had removeal of growth under chin, had  chemotherapy with port inserted( Jan)  and removed ( July)  will have ongoing chemo every 60 day for 2 years    Currently in Pain? Yes   Pain Score 3    Pain Location Other (Comment)  upper trap area   Pain Orientation Right   Pain Descriptors / Indicators Sore   Pain Relieving Factors pillow supportin bed helped            [facility] PT Assessment - 02/24/17 0001      Special Tests    Special Tests Rotator Cuff Impingement   Rotator Cuff Impingment tests Neer impingement test;Hawkins- Merrilyn Puma test;Empty Can test;Painful Arc of Motion     Neer Impingement test    Findings Positive   Side Right     Empty Can test   Findings Positive   Side Right   Comment painful and weak     Painful Arc of Motion   Findings Positive   Side Right   Comments above 90 degrees, but pain continues to end range  [facility] Adult PT Treatment/Exercise - 02/24/17 0001      Shoulder Exercises: Standing   Other Standing Exercises Rockwood 4 vs. red Theraband x 5 each     Modalities   Modalities Cryotherapy     Cryotherapy   Number Minutes Cryotherapy 3 Minutes   Cryotherapy Location Shoulder  right supraspinatus tendon   Type of Cryotherapy Ice massage     Manual Therapy   Soft tissue mobilization cross friction massage to right supraspinatus tendon insertion area   Passive ROM brief left sidebend of neck   Other Manual Therapy soft tissue work in sitting to right upper trap                PT Education - 02/24/17 1720    Education provided Yes   Education Details Rockwood 4 exercises for right shoulder vs. red Theraband   Person(s) Educated Patient   Methods Explanation;Demonstration;Verbal cues;Handout   Comprehension Verbalized understanding;Returned demonstration           Short Term Clinic Goals -  02/15/17 1354      CC Short Term Goal  #1   Title Pt will report the pain in her right shoulder has decreased by 50% so that she is able to perform her daily tasks easier   Baseline 25% as of 01/28/17; 35% as of 02/15/17   Status Partially Met     CC Short Term Goal  #2   Title Pt will be independent in home program for posture and shoulder strength and ROM    Status Achieved             [facility] Goals - 02/15/17 1642      CC Long Term Goal  #3   Title Pt will increase gait velocity to 1.5 m/sec   Baseline 1.37 m/sec at eval; 3 m/sec on 02/07/17   Status Achieved            Plan - 02/24/17 1720    Clinical Impression Statement Pt. having a rough day today, but did well with therapy.  Testing done seems to indicate she may have some shoulder impingement, so added Rockwood exercises to her HEP, did criss friction and ice massage to right supraspinatus tendon area as well as soft tissue work to right upper trap. Her right scalenes are very tight; brief stretching done today and encouraged patient to do this at home.   Rehab Potential Good   Clinical Impairments Affecting Rehab Potential CIPN    PT Frequency 2x / week   PT Duration 8 weeks   PT Treatment/Interventions ADLs/Self Care Home Management;Patient/family education;Gait training;Functional mobility training;Manual techniques;Therapeutic activities;Electrical Stimulation;Therapeutic exercise;Balance training;Neuromuscular re-education;Passive range of motion;Scar mobilization;Iontophoresis 68m/ml Dexamethasone   PT Next Visit Plan Continue rotator cuff strengthening; cross friction massage and icing to right supraspinatus tendon; soft tissue work to right upper trap; stretching to neck.   Consulted and Agree with Plan of Care Patient      Patient will benefit from skilled therapeutic intervention in order to improve the following deficits and impairments:  Decreased endurance, Abnormal gait, Impaired sensation,  Decreased scar mobility, Decreased knowledge of precautions, Decreased activity tolerance, Decreased strength, Increased fascial restricitons, Impaired UE functional use, Pain, Increased muscle spasms, Difficulty walking, Decreased balance, Decreased range of motion, Impaired perceived functional ability, Postural dysfunction  Visit Diagnosis: Abnormal posture - Plan: PT plan of care cert/re-cert  Acute pain of right shoulder - Plan: PT plan of care cert/re-cert  Stiffness of right shoulder, not  elsewhere classified - Plan: PT plan of care cert/re-cert     Problem List Patient Active Problem List   Diagnosis Date Noted  . 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 NHL (non-Hodgkin's lymphoma) ([place]) 10/18/2016  . Hypersensitivity reaction 05/24/2016  . Hyperlipidemia, mixed 01/27/2016  . Grief reaction 01/27/2016  . Preventative health care 01/27/2016  . Anemia 01/27/2016  . History of colonic polyps 01/27/2016  . Actinic keratoses 01/27/2016  . History of chicken pox   . Vitamin D deficiency   . Diffuse large B cell lymphoma ([place])   . History of PCOS     Rikki Trosper 02/24/2017, 5:32 PM  [place], [place], [address] Phone: [phone]   Fax:  [phone]  Name: Corinthia Helmers MRN: [id number] Date of Birth: 1945/10/16  Serafina Royals, PT 02/24/17 5:32 PM

## 2017-02-28 ENCOUNTER — Encounter: Payer: Medicare Other | Admitting: Physical Therapy

## 2017-03-02 ENCOUNTER — Ambulatory Visit: Payer: Medicare Other | Admitting: Physical Therapy

## 2017-03-02 DIAGNOSIS — R262 Difficulty in walking, not elsewhere classified: Secondary | ICD-10-CM | POA: Diagnosis not present

## 2017-03-02 DIAGNOSIS — R293 Abnormal posture: Secondary | ICD-10-CM | POA: Diagnosis not present

## 2017-03-02 DIAGNOSIS — M25611 Stiffness of right shoulder, not elsewhere classified: Secondary | ICD-10-CM

## 2017-03-02 DIAGNOSIS — M6281 Muscle weakness (generalized): Secondary | ICD-10-CM | POA: Diagnosis not present

## 2017-03-02 DIAGNOSIS — M25511 Pain in right shoulder: Secondary | ICD-10-CM | POA: Diagnosis not present

## 2017-03-02 DIAGNOSIS — R209 Unspecified disturbances of skin sensation: Secondary | ICD-10-CM | POA: Diagnosis not present

## 2017-03-02 MED FILL — DEXAMETHASONE 4 MG TABLET: 4 | 2 days supply | Qty: 12 | Fill #0

## 2017-03-02 NOTE — Patient Instructions (Signed)

## 2017-03-02 NOTE — Therapy (Signed)
[place], [place], [address] Phone: [phone]   Fax:  [phone]  Physical Therapy Treatment  Patient Details  Name: Lindsay Harrison MRN: [id number] Date of Birth: [birth year] Referring Provider: Dr. Irene Limbo   Encounter Date: 03/02/2017      PT End of Session - 03/02/17 1722    Visit Number 13   Number of Visits 17   Date for PT Re-Evaluation 03/14/17   PT Start Time 1525   PT Stop Time 1610   PT Time Calculation (min) 45 min   Activity Tolerance Patient tolerated treatment well   Behavior During Therapy [facility] for tasks assessed/performed      Past Medical History:  Diagnosis Date  . Anemia    h/o iron deficiency secondary to heavy menses  . Arthritis    left hand index finger  . Diffuse large B cell lymphoma ([place])   . Grief reaction 01/27/2016  . H/O measles    as a child  . History of chicken pox    as a child  . History of PCOS   . Hyperlipidemia, mixed 01/27/2016  . Lymphadenopathy   . 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 Preventative health care 01/27/2016  . Vitamin D deficiency   . Wears glasses     Past Surgical History:  Procedure Laterality Date  . COLONOSCOPY    . IR GENERIC HISTORICAL  05/21/2016   IR FLUORO GUIDE PORT INSERTION RIGHT 05/21/2016 Arne Cleveland, MD [facility]  . IR GENERIC HISTORICAL  05/21/2016   IR US GUIDE VASC ACCESS RIGHT 05/21/2016 Arne Cleveland, MD [facility]  . IR REMOVAL TUN ACCESS W/ PORT W/O FL MOD SED  10/27/2016  . SKIN BIOPSY Left    face  . THYROGLOSSAL DUCT CYST N/A 04/16/2016   Procedure: THYROGLOSSAL DUCT CYST excision;  Surgeon: [name], MD;  Location: [place];  Service: ENT;  Laterality: N/A;    There were no vitals filed for this visit.      Subjective Assessment - 03/02/17 1528    Subjective "I went to the mountains with friends and I used a hiking stick and should have used it in my left hand." I think the theraband  exercises are helpful, cause it was feeling better until I used the hiking stick; and using the little pillow at night helps.   Pertinent History lymphoma diagnosed in December 2017, she had removeal of growth under chin, had  chemotherapy with port inserted( Jan)  and removed ( July)  will have ongoing chemo every 60 day for 2 years    Currently in Pain? Yes   Pain Score 4    Pain Location Shoulder   Pain Orientation Right;Anterior  biceps tendon area   Aggravating Factors  shoulder forward   Pain Relieving Factors keeping shoulder back                         [facility] Adult PT Treatment/Exercise - 03/02/17 0001      Shoulder Exercises: Supine   Other Supine Exercises supine over towel roll, shoulder isometric extension with scapular retraction x 5     Modalities   Modalities Iontophoresis     Iontophoresis   Type of Iontophoresis Dexamethasone   Location right supraspinatus tendon   Dose 1 ml.   Time 6 hour STAT patch     Manual Therapy   Soft tissue mobilization cross friction massage to right supraspinatus tendon insertion area  Passive ROM in supine over towel roll, pect minor stretches; in sitting, neck sidebend each way   Other Manual Therapy soft tissue work in sitting to right upper trap                PT Education - 03/02/17 1722    Education provided Yes   Education Details about iontophoresis   Person(s) Educated Patient   Methods Explanation;Handout   Comprehension Verbalized understanding           Short Term Clinic Goals - 02/15/17 1354      CC Short Term Goal  #1   Title Pt will report the pain in her right shoulder has decreased by 50% so that she is able to perform her daily tasks easier   Baseline 25% as of 01/28/17; 35% as of 02/15/17   Status Partially Met     CC Short Term Goal  #2   Title Pt will be independent in home program for posture and shoulder strength and ROM    Status Achieved             Long Term  Clinic Goals - 02/15/17 1642      CC Long Term Goal  #3   Title Pt will increase gait velocity to 1.5 m/sec   Baseline 1.37 m/sec at eval; 3 m/sec on 02/07/17   Status Achieved            Plan - 03/02/17 1723    Clinical Impression Statement Pt. feels the Rockwood exercises are going well and that she was feeling better in right shoulder area until this weekend, when she leaned on a walking stick in her right hand when hiking.  Added iontophoresis treatment to right supraspinatus tendon today.    Rehab Potential Good   Clinical Impairments Affecting Rehab Potential CIPN    PT Frequency 2x / week   PT Duration 8 weeks   PT Treatment/Interventions ADLs/Self Care Home Management;Patient/family education;Gait training;Functional mobility training;Manual techniques;Therapeutic activities;Electrical Stimulation;Therapeutic exercise;Balance training;Neuromuscular re-education;Passive range of motion;Scar mobilization;Iontophoresis 28m/ml Dexamethasone   PT Next Visit Plan Continue rotator cuff strengthening; cross friction massage and ionto to right supraspinatus tendon if she tolerated that well; soft tissue work to right upper trap; stretching to neck.     Consulted and Agree with Plan of Care Patient      Patient will benefit from skilled therapeutic intervention in order to improve the following deficits and impairments:  Decreased endurance, Abnormal gait, Impaired sensation, Decreased scar mobility, Decreased knowledge of precautions, Decreased activity tolerance, Decreased strength, Increased fascial restricitons, Impaired UE functional use, Pain, Increased muscle spasms, Difficulty walking, Decreased balance, Decreased range of motion, Impaired perceived functional ability, Postural dysfunction  Visit Diagnosis: Abnormal posture  Acute pain of right shoulder  Stiffness of right shoulder, not elsewhere classified     Problem List Patient Active Problem List   Diagnosis Date  Noted  . 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 Follicular [place] of [place] 01/10/2017  . [place] ([place]odgkin's [place] 10/18[place]2016  . Hypersensitivity reaction 05/24/2016  . Hyperlipidemia, mixed 01/27/2016  . Grief reaction 01/27/2016  . Preventative health care 01/27/2016  . Anemia 01/27/2016  . History of colonic polyps 01/27/2016  . Actinic keratoses 01/27/2016  . History of chicken pox   . Vitamin D deficiency   . [place] B cell [place]   . History of PCOS     SALISBURY,DONNA 03/02/2017, 5:26 PM  [facility]  [place], [place], [address] Phone: [phone]   Fax:  [phone]  Name: Ivett Luebbe MRN: [id number] Date of Birth: 1946-02-01  Serafina Royals, PT 03/02/17 5:26 PM

## 2017-03-04 ENCOUNTER — Ambulatory Visit: Payer: Medicare Other | Attending: Hematology | Admitting: Physical Therapy

## 2017-03-04 DIAGNOSIS — M25511 Pain in right shoulder: Secondary | ICD-10-CM

## 2017-03-04 DIAGNOSIS — R209 Unspecified disturbances of skin sensation: Secondary | ICD-10-CM | POA: Insufficient documentation

## 2017-03-04 DIAGNOSIS — M6281 Muscle weakness (generalized): Secondary | ICD-10-CM | POA: Diagnosis not present

## 2017-03-04 DIAGNOSIS — R293 Abnormal posture: Secondary | ICD-10-CM | POA: Diagnosis not present

## 2017-03-04 DIAGNOSIS — M25611 Stiffness of right shoulder, not elsewhere classified: Secondary | ICD-10-CM | POA: Diagnosis not present

## 2017-03-04 NOTE — Therapy (Signed)
[place], [place], [address] Phone: [phone]   Fax:  [phone]  Physical Therapy Treatment  Patient Details  Name: Lindsay Harrison MRN: [id number] Date of Birth: 1946-03-04 Referring Provider: Dr. Irene Limbo   Encounter Date: 03/04/2017      PT End of Session - 03/04/17 1113    Visit Number 14   Number of Visits 17   Date for PT Re-Evaluation 03/14/17   PT Start Time 1020   PT Stop Time 1108   PT Time Calculation (min) 48 min   Activity Tolerance Patient tolerated treatment well   Behavior During Therapy [facility] for tasks assessed/performed      Past Medical History:  Diagnosis Date  . Anemia    h/o iron deficiency secondary to heavy menses  . Arthritis    left hand index finger  . Diffuse large B cell lymphoma ([place])   . Grief reaction 01/27/2016  . H/O measles    as a child  . History of chicken pox    as a child  . History of PCOS   . Hyperlipidemia, mixed 01/27/2016  . Lymphadenopathy   . 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 Preventative health care 01/27/2016  . Vitamin D deficiency   . Wears glasses     Past Surgical History:  Procedure Laterality Date  . COLONOSCOPY    . IR GENERIC HISTORICAL  05/21/2016   IR FLUORO GUIDE PORT INSERTION RIGHT 05/21/2016 Arne Cleveland, MD [facility]  . IR GENERIC HISTORICAL  05/21/2016   IR US GUIDE VASC ACCESS RIGHT 05/21/2016 Arne Cleveland, MD [facility]  . IR REMOVAL TUN ACCESS W/ PORT W/O FL MOD SED  10/27/2016  . SKIN BIOPSY Left    face  . THYROGLOSSAL DUCT CYST N/A 04/16/2016   Procedure: THYROGLOSSAL DUCT CYST excision;  Surgeon: [name], MD;  Location: [facility];  Service: ENT;  Laterality: N/A;    There were no vitals filed for this visit.      Subjective Assessment - 03/04/17 1022    Subjective I'm wondering if some of this stuff in here could be scar tissue related to the [place]. Tolerated the iontophoresis okay other than  discomfort with peeling off the adhesive patch.  Hasn't noticed benefit so far.   Pertinent History lymphoma diagnosed in December 2017, she had removeal of growth under chin, had  chemotherapy with port inserted( Jan)  and removed ( July)  will have ongoing chemo every 60 day for 2 years    Currently in Pain? Yes   Pain Score 3    Pain Location Chest   Pain Orientation Right;Upper;Lateral   Pain Score 4   Pain Location Back   Pain Orientation Right;Upper   Aggravating Factors  woke her up three times last night            [facility] PT Assessment - 03/04/17 0001      Palpation   Palpation comment tender just medial to right biceps tendon area     Special Tests   Rotator Cuff Impingment tests --  no pain with resisted elbow flexion                     [facility] Adult PT Treatment/Exercise - 03/04/17 0001      Shoulder Exercises: Supine   Other Supine Exercises supine over foam roller, horizontal abduction of both shoulders   Other Supine Exercises patient demonstrated her yoga stretches that get right upper outer  chest area to stretch     Shoulder Exercises: Prone   Extension AROM;Right;10 reps   External Rotation AROM;Right;10 reps   Horizontal ABduction 1 AROM;Right;10 reps  2 positions: palm down, thumb up [facility] exx.)   Horizontal ABduction 1 Limitations felt pinch with rep #7   Horizontal ABduction 2 AROM;Right;10 reps  shld. in approx. 110 degrees abduction; 2 positions   Horizontal ABduction 2 Limitations felt pinch in shoulder at rep #6, so stopped      Iontophoresis   Type of Iontophoresis Dexamethasone   Location right supraspinatus tendon   Dose 1 ml.   Time 6 hour STAT patch     Manual Therapy   Soft tissue mobilization cross friction massage to right supraspinatus tendon insertion area   Passive ROM supine over foam roller, pect minor stretches 30+ seconds x 2   Other Manual Therapy trigger point release at right upper trap trigger point                 PT Education - 03/04/17 1112    Education provided Yes   Education Details to use cold pack for 10 minutes twice a day for area of tenderness at right upper outer chest   Person(s) Educated Patient   Methods Explanation   Comprehension Verbalized understanding           Short Term Clinic Goals - 03/04/17 1028      CC Short Term Goal  #1   Title Pt will report the pain in her right shoulder has decreased by 50% so that she is able to perform her daily tasks easier   Baseline 25% as of 01/28/17; 35% as of 02/15/17; 40% as of 03/04/17   Status Partially [place] [facility] Goals - 02/15/17 1642      CC Long Term Goal  #3   Title Pt will increase gait velocity to 1.5 m/sec   Baseline 1.37 m/sec at eval; 3 m/sec on 02/07/17   Status Achieved            Plan - 03/04/17 1113    Clinical Impression Statement Pt. is making gradual progress with decreasing pain in right shoulder area.  She now has a tender area at right upper outer chest, just medial to biceps tendon.  She was encouraged to ice this at home and to continue only exercises that do not cause pain.   Rehab Potential Good   PT Frequency 2x / week   PT Duration 8 weeks   PT Treatment/Interventions ADLs/Self Care Home Management;Patient/family education;Gait training;Functional mobility training;Manual techniques;Therapeutic activities;Electrical Stimulation;Therapeutic exercise;Balance training;Neuromuscular re-education;Passive range of motion;Scar mobilization;Iontophoresis 51m/ml Dexamethasone   PT Next Visit Plan Recheck right upper outer chest tenderness. Continue rotator cuff strengthening; cross friction massage and ionto to right supraspinatus tendon if she tolerated that well; soft tissue work to right upper trap; stretching to neck.     PT Home Exercise Plan only exercises that do not cause pain; work on retracting right shoulder out of rounded shoulder position   Consulted  and Agree with Plan of Care Patient      Patient will benefit from skilled therapeutic intervention in order to improve the following deficits and impairments:  Decreased endurance, Abnormal gait, Impaired sensation, Decreased scar mobility, Decreased knowledge of precautions, Decreased activity tolerance, Decreased strength, Increased fascial restricitons, Impaired UE functional use, Pain, Increased muscle spasms, Difficulty walking, Decreased balance, Decreased range of motion,  Impaired perceived functional ability, Postural dysfunction  Visit Diagnosis: Abnormal posture  Acute pain of right shoulder  Stiffness of right shoulder, not elsewhere classified     Problem List Patient Active Problem List   Diagnosis Date Noted  . 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 NHL (non-Hodgkin's lymphoma) ([place]) 10/18/2016  . Hypersensitivity reaction 05/24/2016  . Hyperlipidemia, mixed 01/27/2016  . Grief reaction 01/27/2016  . Preventative health care 01/27/2016  . Anemia 01/27/2016  . History of colonic polyps 01/27/2016  . Actinic keratoses 01/27/2016  . History of chicken pox   . Vitamin D deficiency   . Diffuse large B cell lymphoma ([place])   . History of PCOS     Kayvon Mo 03/04/2017, 11:19 AM  [place], [place], [address] Phone: [phone]   Fax:  [phone]  Name: Lilybeth Vien MRN: [id number] Date of Birth: 1945-12-11  Serafina Royals, PT 03/04/17 11:19 AM

## 2017-03-07 ENCOUNTER — Telehealth: Payer: Self-pay | Admitting: Hematology

## 2017-03-07 ENCOUNTER — Ambulatory Visit (HOSPITAL_BASED_OUTPATIENT_CLINIC_OR_DEPARTMENT_OTHER): Payer: Medicare Other

## 2017-03-07 ENCOUNTER — Ambulatory Visit (HOSPITAL_BASED_OUTPATIENT_CLINIC_OR_DEPARTMENT_OTHER): Payer: Medicare Other | Admitting: Hematology

## 2017-03-07 ENCOUNTER — Other Ambulatory Visit (HOSPITAL_BASED_OUTPATIENT_CLINIC_OR_DEPARTMENT_OTHER): Payer: Medicare Other

## 2017-03-07 ENCOUNTER — Encounter: Payer: Self-pay | Admitting: Hematology

## 2017-03-07 VITALS — BP 141/68 | HR 57 | Temp 98.9°F | Resp 18 | Ht 68.6 in | Wt 166.1 lb

## 2017-03-07 VITALS — BP 124/69 | HR 63 | Temp 98.6°F | Resp 18

## 2017-03-07 DIAGNOSIS — C8338 Diffuse large B-cell lymphoma, lymph nodes of multiple sites: Secondary | ICD-10-CM | POA: Diagnosis not present

## 2017-03-07 DIAGNOSIS — Z5112 Encounter for antineoplastic immunotherapy: Secondary | ICD-10-CM

## 2017-03-07 DIAGNOSIS — C8298 Follicular lymphoma, unspecified, lymph nodes of multiple sites: Secondary | ICD-10-CM

## 2017-03-07 DIAGNOSIS — G620 Drug-induced polyneuropathy: Secondary | ICD-10-CM

## 2017-03-07 LAB — CBC & DIFF AND RETIC
BASO%: 0.1 % (ref 0.0–2.0)
Basophils Absolute: 0 10*3/uL (ref 0.0–0.1)
EOS%: 0 % (ref 0.0–7.0)
Eosinophils Absolute: 0 10*3/uL (ref 0.0–0.5)
HCT: 39.1 % (ref 34.8–46.6)
HGB: 13.3 g/dL (ref 11.6–15.9)
Immature Retic Fract: 4.5 % (ref 1.60–10.00)
LYMPH%: 3.2 % — ABNORMAL LOW (ref 14.0–49.7)
MCH: 31.1 pg (ref 25.1–34.0)
MCHC: 34 g/dL (ref 31.5–36.0)
MCV: 91.4 fL (ref 79.5–101.0)
MONO#: 0.7 10*3/uL (ref 0.1–0.9)
MONO%: 5.3 % (ref 0.0–14.0)
NEUT#: 11.5 10*3/uL — ABNORMAL HIGH (ref 1.5–6.5)
NEUT%: 91.4 % — ABNORMAL HIGH (ref 38.4–76.8)
Platelets: 283 10*3/uL (ref 145–400)
RBC: 4.28 10*6/uL (ref 3.70–5.45)
RDW: 13.3 % (ref 11.2–14.5)
Retic %: 1.29 % (ref 0.70–2.10)
Retic Ct Abs: 55.21 10*3/uL (ref 33.70–90.70)
WBC: 12.6 10*3/uL — ABNORMAL HIGH (ref 3.9–10.3)
lymph#: 0.4 10*3/uL — ABNORMAL LOW (ref 0.9–3.3)

## 2017-03-07 LAB — COMPREHENSIVE METABOLIC PANEL
ALT: 25 U/L (ref 0–55)
AST: 15 U/L (ref 5–34)
Albumin: 4.3 g/dL (ref 3.5–5.0)
Alkaline Phosphatase: 131 U/L (ref 40–150)
Anion Gap: 9 mEq/L (ref 3–11)
BUN: 14 mg/dL (ref 7.0–26.0)
CO2: 21 mEq/L — ABNORMAL LOW (ref 22–29)
Calcium: 10.2 mg/dL (ref 8.4–10.4)
Chloride: 106 mEq/L (ref 98–109)
Creatinine: 0.7 mg/dL (ref 0.6–1.1)
EGFR: >60 mL/min/{1.73_m2} (ref >60)
Glucose: 123 mg/dl (ref 70–140)
Potassium: 4.3 mEq/L (ref 3.5–5.1)
Sodium: 137 mEq/L (ref 136–145)
Total Bilirubin: 0.45 mg/dL (ref 0.20–1.20)
Total Protein: 7.2 g/dL (ref 6.4–8.3)

## 2017-03-07 LAB — LACTATE DEHYDROGENASE: LDH: 208 U/L (ref 125–245)

## 2017-03-07 MED ORDER — DIPHENHYDRAMINE HCL 25 MG PO CAPS
50.0000 mg | ORAL_CAPSULE | Freq: Once | ORAL | Status: AC
  Administered 2017-03-07: 50 mg via ORAL

## 2017-03-07 MED ORDER — SODIUM CHLORIDE 0.9 % IV SOLN
375.0000 mg/m2 | Freq: Once | INTRAVENOUS | Status: AC
  Administered 2017-03-07: 700 mg via INTRAVENOUS
  Filled 2017-03-07: qty 50

## 2017-03-07 MED ORDER — ACETAMINOPHEN 325 MG PO TABS
650.0000 mg | ORAL_TABLET | Freq: Once | ORAL | Status: AC
  Administered 2017-03-07: 650 mg via ORAL

## 2017-03-07 MED ORDER — SODIUM CHLORIDE 0.9 % IV SOLN
12.0000 mg | Freq: Once | INTRAVENOUS | Status: AC
  Administered 2017-03-07: 12 mg via INTRAVENOUS
  Filled 2017-03-07: qty 1.2

## 2017-03-07 MED ORDER — ACETAMINOPHEN 325 MG PO TABS
ORAL_TABLET | ORAL | Status: AC
  Filled 2017-03-07: qty 2

## 2017-03-07 MED ORDER — DIPHENHYDRAMINE HCL 25 MG PO CAPS
ORAL_CAPSULE | ORAL | Status: AC
  Filled 2017-03-07: qty 2

## 2017-03-07 MED ORDER — SODIUM CHLORIDE 0.9 % IV SOLN
Freq: Once | INTRAVENOUS | Status: AC
  Administered 2017-03-07: 11:00:00 via INTRAVENOUS

## 2017-03-07 NOTE — Progress Notes (Signed)
[name]     HEMATOLOGY/ONCOLOGY CLINIC NOTE  Date of Service: 03/07/17   H[facility] Care Team: [name], MD as PCP - General (Family Medicine) [name] M.D. (ENT)  CHIEF COMPLAINTS/PURPOSE OF CONSULTATION:  f/u Diffuse large B-cell lymphoma   HISTORY OF PRESENTING ILLNESS:   Please see previous note for details of initial presentation.  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 She has completed her 6 cycles of R CHOP and is currently in complete remission. She presents today for her third round of maintenance Rituxan.  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  Has been doing well overall and has been enjoying travel.  Is trying to keep physically active. Minimal residual grade 1 neuropathy in her toes. Is currently working with physical therapy and notes some discomfort and work needed on her right shoulder. She reports going on a recent trip to [place] with her friends.   On review of systems, pt denies abdominal pains, fever, chills and any other acute new symptoms.   MEDICAL HISTORY:  Past Medical History:  Diagnosis Date  . Anemia    h/o iron deficiency secondary to heavy menses  . Arthritis    left hand index finger  . Diffuse large B cell lymphoma ([place])   . Grief reaction 01/27/2016  . H/O measles    as a child  . History of chicken pox    as a child  . History of PCOS   . Hyperlipidemia, mixed 01/27/2016  . Lymphadenopathy   . 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 Preventative health care 01/27/2016  . Vitamin D deficiency   . Wears glasses     SURGICAL HISTORY: Past Surgical History:  Procedure Laterality Date  . COLONOSCOPY    . IR GENERIC HISTORICAL  05/21/2016   IR FLUORO GUIDE PORT INSERTION RIGHT 05/21/2016 Arne Cleveland, MD [facility]  . IR GENERIC HISTORICAL  05/21/2016   IR US GUIDE VASC ACCESS RIGHT 05/21/2016 Arne Cleveland, MD [facility]  . IR REMOVAL TUN ACCESS W/ PORT W/O  FL MOD SED  10/27/2016  . SKIN BIOPSY Left    face    SOCIAL HISTORY: Social History   Socioeconomic History  . Marital status: Married    Spouse name: Not on file  . Number of children: Not on file  . Years of education: Not on file  . Highest education level: Not on file  Social Needs  . Financial resource strain: Not on file  . Food insecurity - worry: Not on file  . Food insecurity - inability: Not on file  . Transportation needs - medical: Not on file  . Transportation needs - non-medical: Not on file  Occupational History  . Occupation: Psychotherapist  Tobacco Use  . Smoking status: Never Smoker  . Smokeless tobacco: Never Used  Substance and Sexual Activity  . Alcohol use: Yes    Alcohol/week: 1.8 - 3.6 oz    Types: 3 - 6 Standard drinks or equivalent per week    Comment: 5 glasses of wine a week.  . Drug use: No  . Sexual activity: No    Partners: Male    Birth control/protection: None  Other Topics Concern  . Not on file  Social History Narrative   Married. Education: [place] [employer]. Exercise: walking, yoga, and bicycling daily for 1-2 hours. Lives alone, works as Management consultant    FAMILY HISTORY: Family History  Problem Relation Age of Onset  .  Arthritis Mother        rheumatoid  . Heart disease Father        CHF  . COPD Sister        Emphysema, h/o cigarettes  . Other Sister        pituatary tumor  . Arthritis Sister   . Obesity Sister   . Cancer Paternal Grandfather        cancer  . Stroke Maternal Aunt     ALLERGIES:  is allergic to erythromycin and other.  MEDICATIONS:  Current Outpatient Medications  Medication Sig Dispense Refill  . Calcium-Magnesium-Vitamin D (CALCIUM MAGNESIUM PO) Take 1 tablet by mouth daily.    . Cholecalciferol (VITAMIN D) 2000 units CAPS Take 2,000 Units by mouth daily.    [name] dexamethasone (DECADRON) 4 MG tablet TAKE 3 TABLETS BY MOUTH TWICE DAILY, TAKE 1 DOSE IN Lindsay MORNING AND 1 DOSE IN Lindsay EVENING Lindsay DAY PRIOR TO  Rituxan 12 tablet 6  . Omega-3 Fatty Acids (OMEGA-3 PO) Take 2 capsules by mouth daily. Omega 3 1280 mg     No current facility-administered medications for this visit.     REVIEW OF SYSTEMS:    10 Point review of Systems was done is negative except as noted above.  PHYSICAL EXAMINATION:  ECOG PERFORMANCE STATUS: 0 - Asymptomatic  Vitals:   03/07/17 0910  BP: (!) 141/68  Pulse: (!) 57  Resp: 18  Temp: 98.9 F (37.2 C)  SpO2: 100%   Filed Weights   03/07/17 0910  Weight: 166 lb 1.6 oz (75.3 kg)   .Body mass index is 24.82 kg/m.  GENERAL:alert, in no acute distress and comfortable SKIN: skin color, texture, turgor are normal, no rashes or significant lesions EYES: normal, conjunctiva are pink and non-injected, sclera clear OROPHARYNX:no exudate, no erythema and lips, buccal mucosa, and tongue normal  NECK: supple, no JVD, thyroid normal size, non-tender, without nodularity LYMPH:  no palpable lymphadenopathy in Lindsay cervical, axillary or inguinal LUNGS: clear to auscultation with normal respiratory effort HEART: regular rate & rhythm,  no murmurs and no lower extremity edema ABDOMEN: abdomen soft, non-tender, normoactive bowel sounds  Musculoskeletal: no cyanosis of digits and no clubbing  PSYCH: alert & oriented x 3 with fluent speech NEURO: no focal motor/sensory deficits  LABORATORY DATA:  I have reviewed Lindsay data as listed  . CBC Latest Ref Rng & Units 03/07/2017 01/10/2017 11/08/2016  WBC 3.9 - 10.3 10e3/uL 12.6(H) 8.4 7.0  Hemoglobin 11.6 - 15.9 g/dL 13.3 12.9 13.1  Hematocrit 34.8 - 46.6 % 39.1 38.2 38.4  Platelets 145 - 400 10e3/uL 283 245 227   . CBC    Component Value Date/Time   WBC 12.6 (H) 03/07/2017 0842   WBC 3.2 (L) 10/27/2016 0742   RBC 4.28 03/07/2017 0842   RBC 4.41 10/27/2016 0742   HGB 13.3 03/07/2017 0842   HCT 39.1 03/07/2017 0842   PLT 283 03/07/2017 0842   MCV 91.4 03/07/2017 0842   MCH 31.1 03/07/2017 0842   MCH 30.8 10/27/2016 0742     MCHC 34.0 03/07/2017 0842   MCHC 34.1 10/27/2016 0742   RDW 13.3 03/07/2017 0842   LYMPHSABS 0.4 (L) 03/07/2017 0842   MONOABS 0.7 03/07/2017 0842   EOSABS 0.0 03/07/2017 0842   BASOSABS 0.0 03/07/2017 0842    . CMP Latest Ref Rng & Units 03/07/2017 01/31/2017 01/10/2017  Glucose 70 - 140 mg/dl 123 94 151(H)  BUN 7.0 - 26.0 mg/dL 14.0 12 13.5  Creatinine  0.6 - 1.1 mg/dL 0.7 0.54 0.7  Sodium 136 - 145 mEq/L 137 140 138  Potassium 3.5 - 5.1 mEq/L 4.3 4.3 4.2  Chloride 96 - 112 mEq/L - 104 -  CO2 22 - 29 mEq/L 21(L) 29 21(L)  Calcium 8.4 - 10.4 mg/dL 10.2 9.6 10.4  Total Protein 6.4 - 8.3 g/dL 7.2 6.4 7.0  Total Bilirubin 0.20 - 1.20 mg/dL 0.45 0.6 0.40  Alkaline Phos 40 - 150 U/L 131 117 148  AST 5 - 34 U/L 15 18 16   ALT 0 - 55 U/L 25 21 28    Component     Latest Ref Rng & Units 05/07/2016  Hepatitis B Surface Ag     Negative Negative  Hep B Core Ab, Tot     Negative Negative  Hep C Virus Ab     0.0 - 0.9 s/co ratio <0.1   . Lab Results  Component Value Date   LDH 208 03/07/2017         RADIOGRAPHIC STUDIES:   No results found.  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  Initial PET/CT showed Scattered small mildly hypermetabolic lymph nodes within Lindsay neck, left hilum, left axilla and left groin, potentially related Lindsay Harrison's lymphoma. Focal hypermetabolic activity within Lindsay left sternal manubrium and left iliac bone, also potentially related to Lindsay Harrison's lymphoma. No evidence of solid visceral organ involvement.  CT guided bone marrow biopsy done -- no evidence of lymphoma involving Bone marrow. Normal LDH. No constitutional symptoms. Harrison has no significant medical comorbidities at baseline.  ECHO with nl EF as expected.  Harrison has completed 6 cycles of R CHOP  PET/CT s/p 6 cycles of R-CHOP shows  complete metabolic response  2) Rituxan hypersensitivity (mild grade 1 hives on Lindsay back - resolved with solumedrol and antihistamines. No issues with dexamethasone pretreatment prior to cycle 2. -Harrison will continue dexamethasone premedication to reduce risk of Rituxan hypersensitivity.  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   PLAN -Harrison has no lab or clinical evidence of lymphoma progression at this time -Labs are stable. Harrison has no other issues with infections or prohibitive toxicities from chemotherapy. -She will proceed with her third round of maintenance Rituxan today -Counseled on infection prevention strategies.  -she is continuing to work with [facility] for PT to work on endurance and stamina as well as to help with complementary management of chemotherapy-related neuropathy.   -Continue on Schedule for maintenance Rituxan q 60 days wityh dexamethasone pre-medications.-please schedule next 2 reatments -labs and clinic visit with Dr Irene Limbo  q 60 days with each treatment.    RTC with Dr Irene Limbo in 60 days with labs with and next dose or Rituxan . Orders Placed This Encounter  Procedures  . CBC & Diff and Retic    Standing Status:   Future    Standing Expiration Date:   03/07/2018  . Comprehensive metabolic panel    Standing Status:   Future    Standing Expiration Date:   03/07/2018  . Lactate dehydrogenase    Standing Status:   Future    Standing Expiration Date:   03/07/2018    All of Lindsay patients questions were answered with apparent satisfaction. Lindsay Harrison knows to call Lindsay clinic with  any problems, questions or concerns.  I spent 20 minutes counseling Lindsay Harrison face to face. Lindsay total time spent in Lindsay appointment was 25 minutes and more than 50% was on counseling and direct Harrison cares.    Sullivan Lone MD [place] AAHIVMS [facility]  [facility] Hematology/Oncology Physician [place]  (Office):       [phone] (Work cell):  [phone] (Fax):           [phone]  This document serves as a record of services personally performed by Sullivan Lone, MD. It was created on his behalf by Alean Rinne, a trained medical scribe. Lindsay creation of this record is based on Lindsay scribe's personal observations and Lindsay provider's statements to them.    .I have reviewed Lindsay above documentation for accuracy and completeness, and I agree with Lindsay above. Brunetta Genera MD Lindsay

## 2017-03-07 NOTE — Patient Instructions (Signed)
[facility] Discharge Instructions for Patients Receiving Chemotherapy  Today you received the following chemotherapy agent: Rituxan If you develop nausea and vomiting that is not controlled by your nausea medication, call the clinic.   BELOW ARE SYMPTOMS THAT SHOULD BE REPORTED IMMEDIATELY:  *FEVER GREATER THAN 100.5 F  *CHILLS WITH OR WITHOUT FEVER  NAUSEA AND VOMITING THAT IS NOT CONTROLLED WITH YOUR NAUSEA MEDICATION  *UNUSUAL SHORTNESS OF BREATH  *UNUSUAL BRUISING OR BLEEDING  TENDERNESS IN MOUTH AND THROAT WITH OR WITHOUT PRESENCE OF ULCERS  *URINARY PROBLEMS  *BOWEL PROBLEMS  UNUSUAL RASH Items with * indicate a potential emergency and should be followed up as soon as possible.  Feel free to call the clinic should you have any questions or concerns. The clinic phone number is [phone].  Please show the [facility] at check-in to the Emergency Department and triage nurse.

## 2017-03-07 NOTE — Telephone Encounter (Signed)
Scheduled appt per 11/5 los - treatment already scheduled - added lab and f/u per 11/5 los - patient to get an updated schedule in the treatment area.

## 2017-03-08 ENCOUNTER — Ambulatory Visit: Payer: Medicare Other | Admitting: Physical Therapy

## 2017-03-10 ENCOUNTER — Ambulatory Visit: Payer: Medicare Other | Admitting: Physical Therapy

## 2017-03-10 ENCOUNTER — Other Ambulatory Visit: Payer: Self-pay

## 2017-03-10 ENCOUNTER — Encounter: Payer: Self-pay | Admitting: Physical Therapy

## 2017-03-10 DIAGNOSIS — M25611 Stiffness of right shoulder, not elsewhere classified: Secondary | ICD-10-CM | POA: Diagnosis not present

## 2017-03-10 DIAGNOSIS — R293 Abnormal posture: Secondary | ICD-10-CM | POA: Diagnosis not present

## 2017-03-10 DIAGNOSIS — M6281 Muscle weakness (generalized): Secondary | ICD-10-CM | POA: Diagnosis not present

## 2017-03-10 DIAGNOSIS — R209 Unspecified disturbances of skin sensation: Secondary | ICD-10-CM | POA: Diagnosis not present

## 2017-03-10 DIAGNOSIS — M25511 Pain in right shoulder: Secondary | ICD-10-CM | POA: Diagnosis not present

## 2017-03-10 NOTE — Therapy (Signed)
[place], [place], [address] Phone: [phone]   Fax:  [phone]  Physical Therapy Treatment  Patient Details  Name: Lindsay Harrison MRN: [id number] Date of Birth: February 23, 1946 Referring Provider: Dr. Irene Limbo    Encounter Date: 03/10/2017  PT End of Session - 03/10/17 1732    Visit Number  15    Number of Visits  17    Date for PT Re-Evaluation  03/14/17    PT Start Time  1430    PT Stop Time  1515    PT Time Calculation (min)  45 min    Activity Tolerance  Patient tolerated treatment well    Behavior During Therapy  [facility] for tasks assessed/performed       Past Medical History:  Diagnosis Date  . Anemia    h/o iron deficiency secondary to heavy menses  . Arthritis    left hand index finger  . Diffuse large B cell lymphoma ([place])   . Grief reaction 01/27/2016  . H/O measles    as a child  . History of chicken pox    as a child  . History of PCOS   . Hyperlipidemia, mixed 01/27/2016  . Lymphadenopathy   . 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 Preventative health care 01/27/2016  . Vitamin D deficiency   . Wears glasses     Past Surgical History:  Procedure Laterality Date  . COLONOSCOPY    . IR GENERIC HISTORICAL  05/21/2016   IR FLUORO GUIDE PORT INSERTION RIGHT 05/21/2016 Arne Cleveland, MD [facility]  . IR GENERIC HISTORICAL  05/21/2016   IR US GUIDE VASC ACCESS RIGHT 05/21/2016 Arne Cleveland, MD [facility]  . IR REMOVAL TUN ACCESS W/ PORT W/O FL MOD SED  10/27/2016  . SKIN BIOPSY Left    face    There were no vitals filed for this visit.  Subjective Assessment - 03/10/17 1439    Subjective  Pt states she had chemo this week and was fatigued after that , but she is better today.  She said her shoulder if feeling a little better,     Pertinent History  lymphoma diagnosed in December 2017, she had removeal of growth under chin, had  chemotherapy with port inserted( Jan)  and removed (  July)  will have ongoing chemo every 60 day for 2 years     Currently in Pain?  Yes    Pain Score  5     Pain Location  Shoulder    Pain Orientation  Right    Pain Descriptors / Indicators  Sore    Pain Type  Chronic pain                      [facility] Adult PT Treatment/Exercise - 03/10/17 0001      Shoulder Exercises: Standing   Other Standing Exercises  added hands in looped yellow theraband today with elbow retraction, started with 5 and will add one per day til she gets to 10       Moist Heat Therapy   Number Minutes Moist Heat  15 Minutes    Moist Heat Location  Shoulder;Cervical      Iontophoresis   Type of Iontophoresis  Dexamethasone    Location  right supraspinatus tendon    Dose  1 ml.    Time  6 hour STAT patch      Manual Therapy   Soft tissue mobilization  with biotone, soft  tisse work to upper traps in left sidelying                 Short Term Clinic Goals - 03/04/17 1028      CC Short Term Goal  #1   Title  Pt will report the pain in her right shoulder has decreased by 50% so that she is able to perform her daily tasks easier    Baseline  25% as of 01/28/17; 35% as of 02/15/17; 40% as of 03/04/17    Status  [facility] Goals - 02/15/17 1642      CC Long Term Goal  #3   Title  Pt will increase gait velocity to 1.5 m/sec    Baseline  1.37 m/sec at eval; 3 m/sec on 02/07/17    Status  Achieved         Plan - 03/10/17 1733    Clinical Impression Statement  pt noted to have persistent increased tightness with trigger points in right upper traps that felt some better after soft tissue work.  She is doing exercise at home and added one more upgraded option Continued with iontophoreseis to right shoulder     Rehab Potential  Good    Clinical Impairments Affecting Rehab Potential  CIPN     PT Frequency  2x / week    PT Duration  8 weeks    PT Treatment/Interventions  ADLs/Self Care Home  Management;Patient/family education;Gait training;Functional mobility training;Manual techniques;Therapeutic activities;Electrical Stimulation;Therapeutic exercise;Balance training;Neuromuscular re-education;Passive range of motion;Scar mobilization;Iontophoresis 46m/ml Dexamethasone    PT Next Visit Plan  Recheck right upper outer chest tenderness. Continue rotator cuff strengthening; cross friction massage and ionto to right supraspinatus tendon if she tolerated that well; soft tissue work to right upper trap; stretching to neck.         Patient will benefit from skilled therapeutic intervention in order to improve the following deficits and impairments:  Decreased endurance, Abnormal gait, Impaired sensation, Decreased scar mobility, Decreased knowledge of precautions, Decreased activity tolerance, Decreased strength, Increased fascial restricitons, Impaired UE functional use, Pain, Increased muscle spasms, Difficulty walking, Decreased balance, Decreased range of motion, Impaired perceived functional ability, Postural dysfunction  Visit Diagnosis: Abnormal posture  Acute pain of right shoulder  Stiffness of right shoulder, not elsewhere classified  Unspecified disturbances of skin sensation     Problem List Patient Active Problem List   Diagnosis Date Noted  . 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 Follicular [place] of [place] 01/10/2017  . NHL (non[place]Hodgkin's lymphoma) [place] 10/18/2016  . Hypersensitivity reaction 05/24/2016  . Hyperlipidemia, mixed 01/27/2016  . Grief reaction 01/27/2016  . Preventative health care 01/27/2016  . Anemia 01/27/2016  . History of colonic polyps 01/27/2016  . Actinic keratoses 01/27/2016  . History of chicken pox   . Vitamin D deficiency   . Diff[place] B cell [place] ([place]   . History of PCOS    TDonato Heinz BOwens SharkPT  BNorwood Levo11/12/2016, 5:36 PM  [place] [address]Phone: [phone]  Fax:  [phone] Name: Lindsay BurguenoMRN: [id number] of Birth: 105-04-1946

## 2017-03-14 ENCOUNTER — Ambulatory Visit: Payer: Medicare Other | Admitting: Physical Therapy

## 2017-03-14 DIAGNOSIS — M25511 Pain in right shoulder: Secondary | ICD-10-CM

## 2017-03-14 DIAGNOSIS — R209 Unspecified disturbances of skin sensation: Secondary | ICD-10-CM | POA: Diagnosis not present

## 2017-03-14 DIAGNOSIS — M25611 Stiffness of right shoulder, not elsewhere classified: Secondary | ICD-10-CM | POA: Diagnosis not present

## 2017-03-14 DIAGNOSIS — M6281 Muscle weakness (generalized): Secondary | ICD-10-CM | POA: Diagnosis not present

## 2017-03-14 DIAGNOSIS — R293 Abnormal posture: Secondary | ICD-10-CM

## 2017-03-14 NOTE — Therapy (Addendum)
[place], [place], [address] Phone: [phone]   Fax:  [phone]  Physical Therapy Treatment  Patient Details  Name: Lindsay Harrison MRN: [id number] Date of Birth: [birth year] Referring Provider: Dr. Irene Limbo    Encounter Date: 03/14/2017  PT End of Session - 03/14/17 1708    Visit Number  16 kx    Number of Visits  18    Date for PT Re-Evaluation  04/08/17    PT Start Time  1435    PT Stop Time  1519    PT Time Calculation (min)  44 min    Activity Tolerance  Patient tolerated treatment well    Behavior During Therapy  [facility] for tasks assessed/performed       Past Medical History:  Diagnosis Date  . Anemia    h/o iron deficiency secondary to heavy menses  . Arthritis    left hand index finger  . Diffuse large B cell lymphoma ([place])   . Grief reaction 01/27/2016  . H/O measles    as a child  . History of chicken pox    as a child  . History of PCOS   . Hyperlipidemia, mixed 01/27/2016  . Lymphadenopathy   . 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 Preventative health care 01/27/2016  . Vitamin D deficiency   . Wears glasses     Past Surgical History:  Procedure Laterality Date  . COLONOSCOPY    . IR GENERIC HISTORICAL  05/21/2016   IR FLUORO GUIDE PORT INSERTION RIGHT 05/21/2016 Arne Cleveland, MD [facility]  . IR GENERIC HISTORICAL  05/21/2016   IR US GUIDE VASC ACCESS RIGHT 05/21/2016 Arne Cleveland, MD [facility]  . IR REMOVAL TUN ACCESS W/ PORT W/O FL MOD SED  10/27/2016  . SKIN BIOPSY Left    face    There were no vitals filed for this visit.  Subjective Assessment - 03/14/17 1437    Subjective  Has not been sleeping well--has been awake from 3 to 5:30 or so.  "It may be grief stuff," but she also wonders whether the iontophoresis can cause this.  Has been raking leaves and that has actually  made the shoulder feel better. Also has been doing the exercises.    Pertinent History   lymphoma diagnosed in December 2017, she had removeal of growth under chin, had  chemotherapy with port inserted( Jan)  and removed ( July)  will have ongoing chemo every 60 day for 2 years     Currently in Pain?  Yes    Pain Score  3     Pain Location  Shoulder    Pain Orientation  Anterior    Pain Relieving Factors  raking                      [facility] Adult PT Treatment/Exercise - 03/14/17 0001      Iontophoresis   Type of Iontophoresis  Dexamethasone    Location  right supraspinatus tendon    Dose  1 ml.    Time  6 hour STAT patch      Manual Therapy   Manual Therapy  Neural Stretch    Soft tissue mobilization  cross friction massage to right supraspinatus tendon insertion area and to right biceps tendon    Other Manual Therapy  soft tissue work in sitting to right upper trap and mid-trap areas using Biotone still with significant tight knot in right upper trap  PT Education - 03/14/17 1708    Education provided  Yes    Education Details  gave her blue Theraband and educated in progression from yellow to red to green to blue    Person(s) Educated  Patient    Methods  Explanation;Handout    Comprehension  Verbalized understanding        Short Term Clinic Goals - 03/14/17 1443      CC Short Term Goal  #1   Title  Pt will report the pain in her right shoulder has decreased by 50% so that she is able to perform her daily tasks easier    Baseline  25% as of 01/28/17; 35% as of 02/15/17; 40% as of 03/04/17; 60% on 03/14/17    Status  Achieved      CC Short Term Goal  #2   Title  Pt will be independent in home program for posture and shoulder strength and ROM     Status  Achieved          Long Term Clinic Goals - 03/14/17 1444      CC Long Term Goal  #1   Title  Pt will have increase in miniBEST score to 25/28     Status  On-going      CC Long Term Goal  #2   Title  Pt will be independent in home program for balance and general strength     Status  Achieved      CC Long Term Goal  #3   Title  Pt will increase gait velocity to 1.5 m/sec    Status  Achieved      CC Long Term Goal  #4   Title  Pt will report she is able to return to her community exercise activities     Status  On-going         Plan - 03/14/17 1710    Clinical Impression Statement  Pt.'s progress has been slower than we might have predicted, but she has been making gradual gains.  Today she met her right shoulder area pain reduction goal.  She continues with some of the other goals.  She is doing some high-level yoga poses for her balance.      Rehab Potential  Good    Clinical Impairments Affecting Rehab Potential  CIPN     PT Frequency  1x / week    PT Duration  3 weeks    PT Treatment/Interventions  ADLs/Self Care Home Management;Patient/family education;Gait training;Functional mobility training;Manual techniques;Therapeutic activities;Electrical Stimulation;Therapeutic exercise;Balance training;Neuromuscular re-education;Passive range of motion;Scar mobilization;Iontophoresis '4mg'$ /ml Dexamethasone    PT Next Visit Plan  Patient will have one more visit this week--redo miniBEST assessment and continue treatment such as today.  She will then be out of town for about 11 days for Thanksgiving.  Plan is for her to return for one visit following that to ensure that she continues to improve and hasn't had any increase in pain.    PT Home Exercise Plan  only exercises that do not cause pain; work on retracting right shoulder out of rounded shoulder position; progress Theraband [place] exercises as able with higher levels of Theraband    Consulted and Agree with Plan of Care  Patient       Patient will benefit from skilled therapeutic intervention in order to improve the following deficits and impairments:  Decreased endurance, Abnormal gait, Impaired sensation, Decreased scar mobility, Decreased knowledge of precautions, Decreased activity tolerance, Decreased  strength, Increased fascial  restricitons, Impaired UE functional use, Pain, Increased muscle spasms, Difficulty walking, Decreased balance, Decreased range of motion, Impaired perceived functional ability, Postural dysfunction  Visit Diagnosis: Abnormal posture - Plan: PT plan of care cert/re-cert  Acute pain of right shoulder - Plan: PT plan of care cert/re-cert  Stiffness of right shoulder, not elsewhere classified - Plan: PT plan of care cert/re-cert  Unspecified disturbances of skin sensation - Plan: PT plan of care cert/re-cert  Muscle weakness (generalized) - Plan: PT plan of care cert/re-cert     Problem List Patient Active Problem List   Diagnosis Date Noted  . 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 NHL (non-Hodgkin's lymphoma) ([place]) 10/18/2016  . Hypersensitivity reaction 05/24/2016  . Hyperlipidemia, mixed 01/27/2016  . Grief reaction 01/27/2016  . Preventative health care 01/27/2016  . Anemia 01/27/2016  . History of colonic polyps 01/27/2016  . Actinic keratoses 01/27/2016  . History of chicken pox   . Vitamin D deficiency   . Diffuse large B cell lymphoma ([place])   . History of PCOS     SALISBURY,DONNA 03/14/2017, 5:16 PM  [place], [place], [address] Phone: [phone]   Fax:  [phone]  Name: Lindsay Harrison MRN: [id number] Date of Birth: [birth year]  Serafina Royals, PT 03/14/17 5:16 PM  PHYSICAL THERAPY DISCHARGE SUMMARY  Visits from Start of Care: 16  Current functional level related to goals / functional outcomes: Goals partially met as noted above.   Remaining deficits: Unknown. Patient was to have returned for two final visits which were to include reassessment of status, but she did not come for those.   Education / Equipment: Home exercise program. Plan: Patient agrees to discharge.   Patient goals were partially met. Patient is being discharged due to being pleased with the current functional level.  ?????    Serafina Royals, PT 08/18/17 3:00 PM

## 2017-03-17 ENCOUNTER — Encounter: Payer: Medicare Other | Admitting: Physical Therapy

## 2017-03-23 ENCOUNTER — Encounter: Payer: Self-pay | Admitting: Family Medicine

## 2017-04-01 ENCOUNTER — Ambulatory Visit: Payer: Medicare Other | Admitting: Physical Therapy

## 2017-04-13 ENCOUNTER — Encounter: Payer: Medicare Other | Admitting: Physical Therapy

## 2017-05-05 NOTE — Progress Notes (Signed)
[name]     HEMATOLOGY/ONCOLOGY CLINIC NOTE  Date of Service: 05/09/17   Patient Care Team: [name], MD as PCP - General (Family Medicine) [name] M.D. (ENT)  CHIEF COMPLAINTS/PURPOSE OF CONSULTATION:  f/u Diffuse large B-cell lymphoma   HISTORY OF PRESENTING ILLNESS:   Please see previous note for details of initial presentation.  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 She has completed her 6 cycles of R CHOP and is currently in complete remission. She presents today for her fourth round of maintenance Rituxan.   She notes this was her first set of holidays without her husband but she was surrounded by family and friends. She has been talking to a counselor while she has been making this transition. She has been trying to be active even in the constant change in weather. She was advised to find an active routine and diet that works for her.  On review of symptoms, pt notes neuropathy in her feet has improved (R better then L). She denies any back or abdominal pain.  No fevers no chills no night sweats. No evidence of new lymphadenopathy.   MEDICAL HISTORY:  Past Medical History:  Diagnosis Date  . Anemia    h/o iron deficiency secondary to heavy menses  . Arthritis    left hand index finger  . Diffuse large B cell lymphoma ([place])   . Grief reaction 01/27/2016  . H/O measles    as a child  . History of chicken pox    as a child  . History of PCOS   . Hyperlipidemia, mixed 01/27/2016  . Lymphadenopathy   . 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 Preventative health care 01/27/2016  . Vitamin D deficiency   . Wears glasses     SURGICAL HISTORY: Past Surgical History:  Procedure Laterality Date  . COLONOSCOPY    . IR GENERIC HISTORICAL  05/21/2016   IR FLUORO GUIDE PORT INSERTION RIGHT 05/21/2016 Arne Cleveland, MD [facility]  . IR GENERIC HISTORICAL  05/21/2016   IR US GUIDE VASC ACCESS  RIGHT 05/21/2016 Arne Cleveland, MD [facility]  . IR REMOVAL TUN ACCESS W/ PORT W/O FL MOD SED  10/27/2016  . SKIN BIOPSY Left    face  . THYROGLOSSAL DUCT CYST N/A 04/16/2016   Procedure: THYROGLOSSAL DUCT CYST excision;  Surgeon: [name], MD;  Location: [place];  Service: ENT;  Laterality: N/A;    SOCIAL HISTORY: Social History   Socioeconomic History  . Marital status: Married    Spouse name: Not on file  . Number of children: Not on file  . Years of education: Not on file  . Highest education level: Not on file  Social Needs  . Financial resource strain: Not on file  . Food insecurity - worry: Not on file  . Food insecurity - inability: Not on file  . Transportation needs - medical: Not on file  . Transportation needs - non-medical: Not on file  Occupational History  . Occupation: Psychotherapist  Tobacco Use  . Smoking status: Never Smoker  . Smokeless tobacco: Never Used  Substance and Sexual Activity  . Alcohol use: Yes    Alcohol/week: 1.8 - 3.6 oz    Types: 3 - 6 Standard drinks or equivalent per week    Comment: 5 glasses of wine a week.  . Drug use: No  . Sexual activity: No    Partners: Male  Birth control/protection: None  Other Topics Concern  . Not on file  Social History Narrative   Married. Education: College. Exercise: walking, yoga, and bicycling daily for 1-2 hours. Lives alone, works as psychotherapist    FAMILY HISTORY: Family History  Problem Relation Age of Onset  . Arthritis Mother        rheumatoid  . Heart disease Father        CHF  . COPD Sister        Emphysema, h/o cigarettes  . Other Sister        pituatary tumor  . Arthritis Sister   . Obesity Sister   . Cancer Paternal Grandfather        cancer  . Stroke Maternal Aunt     ALLERGIES:  is allergic to erythromycin and other.  MEDICATIONS:  Current Outpatient Medications  Medication Sig Dispense Refill  . Calcium-Magnesium-Vitamin D (CALCIUM MAGNESIUM PO) Take 1  tablet by mouth daily.    . Cholecalciferol (VITAMIN D) 2000 units CAPS Take 2,000 Units by mouth daily.    . dexamethasone (DECADRON) 4 MG tablet TAKE 3 TABLETS BY MOUTH TWICE DAILY, TAKE 1 DOSE IN THE MORNING AND 1 DOSE IN THE EVENING THE DAY PRIOR TO Rituxan 12 tablet 6  . Omega-3 Fatty Acids (OMEGA-3 PO) Take 2 capsules by mouth daily. Omega 3 1280 mg     No current facility-administered medications for this visit.     REVIEW OF SYSTEMS:    10 Point review of Systems was done is negative except as noted above.  PHYSICAL EXAMINATION:  ECOG PERFORMANCE STATUS: 0 - Asymptomatic  Vitals:   05/09/17 0843  BP: 127/76  Pulse: 69  Resp: 18  Temp: 98.8 F (37.1 C)  SpO2: 100%   Filed Weights   05/09/17 0843  Weight: 168 lb 12.8 oz (76.6 kg)   .Body mass index is 25.22 kg/m.  GENERAL: alert, in no acute distress and comfortable SKIN: skin color, texture, turgor are normal, no rashes or significant lesions EYES: normal, conjunctiva are pink and non-injected, sclera clear OROPHARYNX: no exudate, no erythema and lips, buccal mucosa, and tongue normal  NECK: supple, no JVD, thyroid normal size, non-tender, without nodularity LYMPH:  no palpable lymphadenopathy in the cervical, axillary or inguinal LUNGS: clear to auscultation with normal respiratory effort HEART: regular rate & rhythm,  no murmurs and no lower extremity edema ABDOMEN: abdomen soft, non-tender, normoactive bowel sounds  Musculoskeletal: no cyanosis of digits and no clubbing  PSYCH: alert & oriented x 3 with fluent speech NEURO: no focal motor/sensory deficits  LABORATORY DATA:  I have reviewed the data as listed  . CBC Latest Ref Rng & Units 05/09/2017 03/07/2017 01/10/2017  WBC 3.9 - 10.3 K/uL 9.2 12.6(H) 8.4  Hemoglobin 11.6 - 15.9 g/dL 14.0 13.3 12.9  Hematocrit 34.8 - 46.6 % 40.5 39.1 38.2  Platelets 145 - 400 K/uL 256 283 245   . CBC    Component Value Date/Time   WBC 9.2 05/09/2017 0730   RBC 4.44  05/09/2017 0730   HGB 14.0 05/09/2017 0730   HGB 13.3 03/07/2017 0842   HCT 40.5 05/09/2017 0730   HCT 39.1 03/07/2017 0842   PLT 256 05/09/2017 0730   PLT 283 03/07/2017 0842   MCV 91.2 05/09/2017 0730   MCV 91.4 03/07/2017 0842   MCH 31.5 05/09/2017 0730   MCHC 34.6 05/09/2017 0730   RDW 12.7 05/09/2017 0730   RDW 13.3 03/07/2017 0842   LYMPHSABS 0.3 (L)   05/09/2017 0730   LYMPHSABS 0.4 (L) 03/07/2017 0842   MONOABS 0.2 05/09/2017 0730   MONOABS 0.7 03/07/2017 0842   EOSABS 0.0 05/09/2017 0730   EOSABS 0.0 03/07/2017 0842   BASOSABS 0.0 05/09/2017 0730   BASOSABS 0.0 03/07/2017 0842    . CMP Latest Ref Rng & Units 05/09/2017 03/07/2017 01/31/2017  Glucose 70 - 140 mg/dL 154(H) 123 94  BUN 7 - 26 mg/dL 15 14.0 12  Creatinine 0.60 - 1.10 mg/dL 0.84 0.7 0.54  Sodium 136 - 145 mmol/L 140 137 140  Potassium 3.3 - 4.7 mmol/L 3.9 4.3 4.3  Chloride 98 - 109 mmol/L 105 - 104  CO2 22 - 29 mmol/L 23 21(L) 29  Calcium 8.4 - 10.4 mg/dL 9.9 10.2 9.6  Total Protein 6.4 - 8.3 g/dL 7.1 7.2 6.4  Total Bilirubin 0.2 - 1.2 mg/dL 0.5 0.45 0.6  Alkaline Phos 40 - 150 U/L 144 131 117  AST 5 - 34 U/L _0 ALT 0 - 55 U/L _1 Component     Latest Ref Rng & Units 05/07/2016  Hepatitis B Surface Ag     Negative Negative  Hep B Core Ab, Tot     Negative Negative  Hep C Virus Ab     0.0 - 0.9 s/co ratio <0.1   . Lab Results  Component Value Date   LDH 185 05/09/2017         RADIOGRAPHIC STUDIES:   No results found.  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  Initial PET/CT showed Scattered small mildly hypermetabolic lymph nodes within the neck, left hilum, left axilla and left groin, potentially related the patient's lymphoma. Focal hypermetabolic activity within the left sternal manubrium and left iliac bone, also potentially  related to the patient's lymphoma. No evidence of solid visceral organ involvement.  CT guided bone marrow biopsy done -- no evidence of lymphoma involving Bone marrow. Normal LDH. No constitutional symptoms. Patient has no significant medical comorbidities at baseline.  ECHO with nl EF as expected.  Patient has completed 6 cycles of R CHOP  PET/CT s/p 6 cycles of R-CHOP shows complete metabolic response  . Lab Results  Component Value Date   LDH 185 05/09/2017   2) Rituxan hypersensitivity (mild grade 1 hives on the back  - resolved with solumedrol and antihistamines. No issues with dexamethasone pretreatment -Patient will continue dexamethasone premedication to reduce risk of Rituxan hypersensitivity.  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  -Improved overall.   PLAN  -Patient has no lab or clinical evidence of lymphoma progression at this time -Labs are stable, LDH levels wnl. - Patient has no other issues with infections or prohibitive toxicities from maintenance Rituxan. -She will proceed with her fourth round of maintenance Rituxan today -Counseled on infection prevention strategies. She is up to date on her vaccinations.  -She is continuing to work with [facility] for PT to work on endurance and stamina as well as to help with complementary management of chemotherapy-related neuropathy. Neuropathy has improved.  -Continue on schedule for maintenance Rituxan q 60 days with dexamethasone pre-medications for a total of 2 years as of now.  -Labs and clinic visit with Dr Irene Limbo q 60 days with each treatment.  -  Will do repeat CT CAP w Contrast before her next visit in 2 months -Discussed her recent increase in total cholesterol which can be repeated in 6 months. Given no history of cardiac issues this does not suggest a need to alter treatment. I advised her to  find a diet and active routine that works for her in the meantime.   Continue Maintenance Rituxan q60 days CT chest/abd/pelvis in 50 days RTC with Dr Irene Limbo in 60 days with labs with and next dose or Rituxan  . Orders Placed This Encounter  Procedures  . CBC & Diff and Retic    Standing Status:   Future    Standing Expiration Date:   05/09/2018  . Comprehensive metabolic panel    Standing Status:   Future    Standing Expiration Date:   05/09/2018  . Lactate dehydrogenase    Standing Status:   Future    Standing Expiration Date:   05/09/2018    All of the patients questions were answered with apparent satisfaction. The patient knows to call the clinic with any problems, questions or concerns.  I spent 20 minutes counseling the patient face to face. The total time spent in the appointment was 25 minutes and more than 50% was on counseling and direct patient cares.  Sullivan Lone MD [place] AAHIVMS [facility] Hematology/Oncology Physician [place]  (Office):       [phone] (Work cell):  [phone] (Fax):           [phone]  This document serves as a record of services personally performed by Sullivan Lone, MD. It was created on his behalf by Joslyn Devon, a trained medical scribe. The creation of this record is based on the scribe's personal observations and the provider's statements to them.    .I have reviewed the above documentation for accuracy and completeness, and I agree with the above. Brunetta Genera MD [place]

## 2017-05-09 ENCOUNTER — Inpatient Hospital Stay: Payer: Medicare Other

## 2017-05-09 ENCOUNTER — Inpatient Hospital Stay (HOSPITAL_BASED_OUTPATIENT_CLINIC_OR_DEPARTMENT_OTHER): Payer: Medicare Other | Admitting: Hematology

## 2017-05-09 ENCOUNTER — Encounter: Payer: Self-pay | Admitting: Hematology

## 2017-05-09 ENCOUNTER — Telehealth: Payer: Self-pay | Admitting: Hematology

## 2017-05-09 ENCOUNTER — Inpatient Hospital Stay: Payer: Medicare Other | Attending: Hematology

## 2017-05-09 VITALS — BP 102/51 | HR 45 | Temp 97.9°F | Resp 16

## 2017-05-09 VITALS — BP 127/76 | HR 69 | Temp 98.8°F | Resp 18 | Ht 68.6 in | Wt 168.8 lb

## 2017-05-09 DIAGNOSIS — G620 Drug-induced polyneuropathy: Secondary | ICD-10-CM | POA: Insufficient documentation

## 2017-05-09 DIAGNOSIS — C8298 Follicular lymphoma, unspecified, lymph nodes of multiple sites: Secondary | ICD-10-CM

## 2017-05-09 DIAGNOSIS — C8338 Diffuse large B-cell lymphoma, lymph nodes of multiple sites: Secondary | ICD-10-CM

## 2017-05-09 DIAGNOSIS — C8330 Diffuse large B-cell lymphoma, unspecified site: Secondary | ICD-10-CM | POA: Insufficient documentation

## 2017-05-09 DIAGNOSIS — G629 Polyneuropathy, unspecified: Secondary | ICD-10-CM

## 2017-05-09 LAB — CBC WITH DIFFERENTIAL/PLATELET
Abs Granulocyte: 8.7 10*3/uL — ABNORMAL HIGH (ref 1.5–6.5)
Basophils Absolute: 0 10*3/uL (ref 0.0–0.1)
Basophils Relative: 0 %
Eosinophils Absolute: 0 10*3/uL (ref 0.0–0.5)
Eosinophils Relative: 0 %
HCT: 40.5 % (ref 34.8–46.6)
Hemoglobin: 14 g/dL (ref 11.6–15.9)
Lymphocytes Relative: 3 %
Lymphs Abs: 0.3 10*3/uL — ABNORMAL LOW (ref 0.9–3.3)
MCH: 31.5 pg (ref 25.1–34.0)
MCHC: 34.6 g/dL (ref 31.5–36.0)
MCV: 91.2 fL (ref 79.5–101.0)
Monocytes Absolute: 0.2 10*3/uL (ref 0.1–0.9)
Monocytes Relative: 2 %
Neutro Abs: 8.7 10*3/uL — ABNORMAL HIGH (ref 1.5–6.5)
Neutrophils Relative %: 95 %
Platelets: 256 10*3/uL (ref 145–400)
RBC: 4.44 MIL/uL (ref 3.70–5.45)
RDW: 12.7 % (ref 11.2–16.1)
WBC: 9.2 10*3/uL (ref 3.9–10.3)

## 2017-05-09 LAB — COMPREHENSIVE METABOLIC PANEL
ALT: 24 U/L (ref 0–55)
AST: 13 U/L (ref 5–34)
Albumin: 4.3 g/dL (ref 3.5–5.0)
Alkaline Phosphatase: 144 U/L (ref 40–150)
Anion gap: 12 (ref 5–15)
BUN: 15 mg/dL (ref 7–26)
CO2: 23 mmol/L (ref 22–29)
Calcium: 9.9 mg/dL (ref 8.4–10.4)
Chloride: 105 mmol/L (ref 98–109)
Creatinine, Ser: 0.84 mg/dL (ref 0.60–1.10)
GFR calc Af Amer: >60 mL/min (ref >60)
GFR calc non Af Amer: >60 mL/min (ref >60)
Glucose, Bld: 154 mg/dL — ABNORMAL HIGH (ref 70–140)
Potassium: 3.9 mmol/L (ref 3.3–4.7)
Sodium: 140 mmol/L (ref 136–145)
Total Bilirubin: 0.5 mg/dL (ref 0.2–1.2)
Total Protein: 7.1 g/dL (ref 6.4–8.3)

## 2017-05-09 LAB — LACTATE DEHYDROGENASE: LDH: 185 U/L (ref 125–245)

## 2017-05-09 MED ORDER — DEXAMETHASONE SODIUM PHOSPHATE 100 MG/10ML IJ SOLN
12.0000 mg | Freq: Once | INTRAMUSCULAR | Status: AC
  Administered 2017-05-09: 12 mg via INTRAVENOUS
  Filled 2017-05-09: qty 1.2

## 2017-05-09 MED ORDER — DIPHENHYDRAMINE HCL 25 MG PO CAPS
50.0000 mg | ORAL_CAPSULE | Freq: Once | ORAL | Status: AC
  Administered 2017-05-09: 50 mg via ORAL

## 2017-05-09 MED ORDER — SODIUM CHLORIDE 0.9 % IV SOLN
375.0000 mg/m2 | Freq: Once | INTRAVENOUS | Status: AC
  Administered 2017-05-09: 700 mg via INTRAVENOUS
  Filled 2017-05-09: qty 50

## 2017-05-09 MED ORDER — ACETAMINOPHEN 325 MG PO TABS
650.0000 mg | ORAL_TABLET | Freq: Once | ORAL | Status: AC
  Administered 2017-05-09: 650 mg via ORAL

## 2017-05-09 MED ORDER — SODIUM CHLORIDE 0.9 % IV SOLN
Freq: Once | INTRAVENOUS | Status: AC
  Administered 2017-05-09: 11:00:00 via INTRAVENOUS

## 2017-05-09 NOTE — Telephone Encounter (Signed)
Scheduled apt per 1/7 los - Gave patient calender  Per los.

## 2017-05-09 NOTE — Patient Instructions (Signed)
Thank you for choosing [facility] to provide your oncology and hematology care.  To afford each patient quality time with our providers, please arrive 30 minutes before your scheduled appointment time.  If you arrive late for your appointment, you may be asked to reschedule.  We strive to give you quality time with our providers, and arriving late affects you and other patients whose appointments are after yours.   If you are a no show for multiple scheduled visits, you may be dismissed from the clinic at the providers discretion.    Again, thank you for choosing [facility], our hope is that these requests will decrease the amount of time that you wait before being seen by our physicians.  ______________________________________________________________________  Should you have questions after your visit to the [facility], please contact our office at [phone] between the hours of 8:30 and 4:30 p.m.    Voicemails left after 4:30p.m will not be returned until the following business day.    For prescription refill requests, please have your pharmacy contact us directly.  Please also try to allow 48 hours for prescription requests.    Please contact the scheduling department for questions regarding scheduling.  For scheduling of procedures such as PET scans, CT scans, MRI, Ultrasound, etc please contact central scheduling at [phone].    Resources For Cancer Patients and Caregivers:   Oncolink.org:  A wonderful resource for patients and healthcare providers for information regarding your disease, ways to tract your treatment, what to expect, etc.     American Cancer Society:  800-227-2345  Can help patients locate various types of support and financial assistance  Cancer Care: 1-800-813-HOPE (4673) Provides financial assistance, online support groups, medication/co-pay assistance.    [place] DSS:  336-641-3447 Where to apply for food  stamps, Medicaid, and utility assistance  Medicare Rights Center: 800-333-4114 Helps people with Medicare understand their rights and benefits, navigate the Medicare system, and secure the quality healthcare they deserve  SCAT: 336-333-6589 [employer]'s shared-ride transportation service for eligible riders who have a disability that prevents them from riding the fixed route bus.    For additional information on assistance programs please contact our social worker:   Grier Hock/Abigail Elmore:  [phone]            

## 2017-05-09 NOTE — Patient Instructions (Signed)
[facility] Discharge Instructions for Patients Receiving Chemotherapy  Today you received the following chemotherapy agents: Rituxan   To help prevent nausea and vomiting after your treatment, we encourage you to take your nausea medication as directed.    If you develop nausea and vomiting that is not controlled by your nausea medication, call the clinic.   BELOW ARE SYMPTOMS THAT SHOULD BE REPORTED IMMEDIATELY:  *FEVER GREATER THAN 100.5 F  *CHILLS WITH OR WITHOUT FEVER  NAUSEA AND VOMITING THAT IS NOT CONTROLLED WITH YOUR NAUSEA MEDICATION  *UNUSUAL SHORTNESS OF BREATH  *UNUSUAL BRUISING OR BLEEDING  TENDERNESS IN MOUTH AND THROAT WITH OR WITHOUT PRESENCE OF ULCERS  *URINARY PROBLEMS  *BOWEL PROBLEMS  UNUSUAL RASH Items with * indicate a potential emergency and should be followed up as soon as possible.  Feel free to call the clinic you have any questions or concerns. The clinic phone number is [phone].  Please show the CHEMO ALERT CARD at check-in to the Emergency Department and triage nurse.   

## 2017-05-12 ENCOUNTER — Ambulatory Visit: Payer: Medicare Other | Admitting: Family Medicine

## 2017-05-13 ENCOUNTER — Other Ambulatory Visit: Payer: Self-pay | Admitting: Hematology

## 2017-05-17 ENCOUNTER — Telehealth: Payer: Self-pay | Admitting: Hematology

## 2017-05-17 NOTE — Telephone Encounter (Signed)
Scheduled appt per 1/11 sch message - left message for patient with appt date and time.

## 2017-05-17 NOTE — Telephone Encounter (Signed)
Patient called to reschedule she had a prior appointment it was ok with Dr.

## 2017-06-07 ENCOUNTER — Ambulatory Visit: Payer: Medicare Other | Admitting: Family Medicine

## 2017-06-08 ENCOUNTER — Encounter (INDEPENDENT_AMBULATORY_CARE_PROVIDER_SITE_OTHER): Payer: Self-pay

## 2017-06-08 ENCOUNTER — Encounter: Payer: Self-pay | Admitting: Family Medicine

## 2017-06-09 ENCOUNTER — Encounter (INDEPENDENT_AMBULATORY_CARE_PROVIDER_SITE_OTHER): Payer: Self-pay

## 2017-06-09 ENCOUNTER — Encounter: Payer: Self-pay | Admitting: Family Medicine

## 2017-06-09 ENCOUNTER — Telehealth: Payer: Self-pay | Admitting: *Deleted

## 2017-06-09 NOTE — Telephone Encounter (Signed)
Received pt message with c/o fever up to 102, cold symptoms including aches, chills, congestion, cough.  SW pt this morning, pt states fever has come down since last night, aches are also subsiding.  Informed pt ok to take tylenol or ibuprofen.  Informed pt that counts are WNL, no concern for neutropenic fever at this point.  Pt has not been exposed to flu as far as she knows and has received flu shot this season. Offered pt symptom management clinic.  Pt stated she will continue to monitor symptoms and will call for symptom management if symptoms persist/worsen.  Pt thankful for call.

## 2017-06-09 NOTE — Telephone Encounter (Signed)
Patient now states her fever is going down from 102 to 100. Please advise.

## 2017-06-09 NOTE — Telephone Encounter (Signed)
She may need to see someone if she is still feeling bad. She can take Tylenol ES 500 mg tabs, 1 tab three times a day to manage aches and pains and fevers. Please check with her and if she is worsening encourage her to be seen here, at oncology or at urgent care.

## 2017-06-10 ENCOUNTER — Other Ambulatory Visit: Payer: Self-pay | Admitting: *Deleted

## 2017-06-10 ENCOUNTER — Telehealth: Payer: Self-pay | Admitting: *Deleted

## 2017-06-10 ENCOUNTER — Inpatient Hospital Stay: Payer: Medicare Other

## 2017-06-10 ENCOUNTER — Encounter (INDEPENDENT_AMBULATORY_CARE_PROVIDER_SITE_OTHER): Payer: Self-pay

## 2017-06-10 ENCOUNTER — Inpatient Hospital Stay: Payer: Medicare Other | Attending: Hematology | Admitting: Medical

## 2017-06-10 VITALS — BP 111/71 | HR 70 | Temp 98.8°F | Resp 18 | Ht 68.6 in | Wt 161.8 lb

## 2017-06-10 DIAGNOSIS — R509 Fever, unspecified: Secondary | ICD-10-CM

## 2017-06-10 DIAGNOSIS — R05 Cough: Secondary | ICD-10-CM

## 2017-06-10 DIAGNOSIS — J029 Acute pharyngitis, unspecified: Secondary | ICD-10-CM | POA: Diagnosis not present

## 2017-06-10 DIAGNOSIS — J069 Acute upper respiratory infection, unspecified: Secondary | ICD-10-CM | POA: Diagnosis not present

## 2017-06-10 DIAGNOSIS — C8330 Diffuse large B-cell lymphoma, unspecified site: Secondary | ICD-10-CM | POA: Diagnosis present

## 2017-06-10 DIAGNOSIS — R059 Cough, unspecified: Secondary | ICD-10-CM

## 2017-06-10 LAB — CMP (CANCER CENTER ONLY)
ALT: 21 U/L (ref 0–55)
AST: 19 U/L (ref 5–34)
Albumin: 3.8 g/dL (ref 3.5–5.0)
Alkaline Phosphatase: 84 U/L (ref 40–150)
Anion gap: 10 (ref 3–11)
BUN: 14 mg/dL (ref 7–26)
CO2: 26 mmol/L (ref 22–29)
Calcium: 9.1 mg/dL (ref 8.4–10.4)
Chloride: 101 mmol/L (ref 98–109)
Creatinine: 0.74 mg/dL (ref 0.60–1.10)
GFR, Est AFR Am: >60 mL/min (ref >60)
GFR, Estimated: >60 mL/min (ref >60)
Glucose, Bld: 93 mg/dL (ref 70–140)
Potassium: 4 mmol/L (ref 3.5–5.1)
Sodium: 137 mmol/L (ref 136–145)
Total Bilirubin: 0.5 mg/dL (ref 0.2–1.2)
Total Protein: 6.8 g/dL (ref 6.4–8.3)

## 2017-06-10 LAB — CBC WITH DIFFERENTIAL (CANCER CENTER ONLY)
Basophils Absolute: 0 10*3/uL (ref 0.0–0.1)
Basophils Relative: 0 %
Eosinophils Absolute: 0 10*3/uL (ref 0.0–0.5)
Eosinophils Relative: 1 %
HCT: 40.2 % (ref 34.8–46.6)
Hemoglobin: 13.9 g/dL (ref 11.6–15.9)
Lymphocytes Relative: 18 %
Lymphs Abs: 0.7 10*3/uL — ABNORMAL LOW (ref 0.9–3.3)
MCH: 31.4 pg (ref 25.1–34.0)
MCHC: 34.6 g/dL (ref 31.5–36.0)
MCV: 90.7 fL (ref 79.5–101.0)
Monocytes Absolute: 0.6 10*3/uL (ref 0.1–0.9)
Monocytes Relative: 16 %
Neutro Abs: 2.5 10*3/uL (ref 1.5–6.5)
Neutrophils Relative %: 65 %
Platelet Count: 187 10*3/uL (ref 145–400)
RBC: 4.43 MIL/uL (ref 3.70–5.45)
RDW: 12.9 % (ref 11.2–14.5)
WBC Count: 3.7 10*3/uL — ABNORMAL LOW (ref 3.9–10.3)

## 2017-06-10 LAB — RAPID STREP SCREEN (MED CTR MEBANE ONLY): Streptococcus, Group A Screen (Direct): NEGATIVE

## 2017-06-10 MED ORDER — AMOXICILLIN-POT CLAVULANATE 875-125 MG PO TABS: 1.0000 | ORAL_TABLET | Freq: Two times a day (BID) | ORAL | 0 refills | Status: DC

## 2017-06-10 MED ORDER — MAGIC MOUTHWASH W/LIDOCAINE: 5.0000 mL | Freq: Four times a day (QID) | ORAL | 0 refills | Status: DC | PRN

## 2017-06-10 MED ORDER — HYDROCODONE-HOMATROPINE 5-1.5 MG/5ML PO SYRP: 5.0000 mL | ORAL_SOLUTION | Freq: Four times a day (QID) | ORAL | 0 refills | Status: DC | PRN

## 2017-06-10 NOTE — Telephone Encounter (Signed)
Called patient to advise her of the message per Dr. Charlett Blake.  Patient did not answer left message for patient to call the office back.   Looked in patients chart and seen where she is currently at Oncology  .

## 2017-06-10 NOTE — Progress Notes (Signed)
[facility] Progress Note   Lindsay Harrison [id number] Dec 05, 1945 72 y.o.  Lindsay Harrison is managed by Dr. Sullivan Lone  Actively treated with chemotherapy: yes  Current Therapy: Rituximab  Last Treated: 05/09/2017 (cycle 4, day 1)  Assessment: Plan:    Sore throat - Plan: Culture, Group A Strep, magic mouthwash w/lidocaine SOLN  Upper respiratory tract infection, unspecified type - Plan: amoxicillin-clavulanate (AUGMENTIN) 875-125 MG tablet  Cough - Plan: HYDROcodone-homatropine (HYCODAN) 5-1.5 MG/5ML syrup   Sore throat: A throat swab was collected today for a rapid strep test which returned negative.  A throat culture is pending.  Patient was given a prescription for Magic mouthwash with lidocaine for her sore throat.  Upper respiratory tract infection: Patient was given a prescription for Augmentin 875-125 p.o. twice daily times 7 days.  A complete blood count was ordered with results returning showing a WBC of 3.7 and an ANC of 2.5.  Cough: Patient was given a prescription for Hycodan 5-1.5 mg / 5 ml, p.o. 4 times daily as needed cough.  Please see After Visit Summary for patient specific instructions.  Future Appointments  Date Time Provider [place]  07/07/2017 10:00 AM [facility] [place]  07/11/2017  8:00 AM [facility] 2 [facility] None  07/11/2017  8:40 AM Brunetta Genera, MD [facility] None  07/11/2017  9:30 AM [facility] G22 [facility] None  07/25/2017  8:30 AM [name], MD [facility]  09/12/2017  8:00 AM [facility] LAB 1 [facility] None  09/12/2017  8:40 AM Brunetta Genera, MD [facility] None  09/12/2017  9:45 AM [facility] A2 [facility] None  02/01/2018  9:00 AM Vevelyn Royals, Parthenia Ames, RN [facility]    Orders Placed This Encounter  Procedures  . Culture, Group A Strep       Subjective:   Patient ID:  Lindsay Harrison is a 72 y.o. (DOB [birth year]) female.  Chief Complaint:  Chief Complaint    Patient presents with  . Fever    HPI Lindsay Harrison is a 72 year old female with a history of a follicular lymphoma with large cell transformation.  She was treated with 6 cycles of R CHOP and is currently in remission.  She completed her fourth round of maintenance rituximab on 05/09/2017.  She presents to the office today with a 3-day history of fevers.  She states that she was working in her yard on Sunday and then began to feel poorly on Monday and Tuesday.  She thought it was simply something that she had been exposed to while working in her yard.  She has now developed a productive cough with yellowish green sputum, sore throat,  generalized achiness and fevers of up to 102.6.  She reports that she has been coughing all night.  Medications: I have reviewed the patient's current medications.  Allergies:  Allergies  Allergen Reactions  . Erythromycin Other (See Comments)    Patient states that she passed out while using this medication ? SYNCOPE ?  [name] Other Anaphylaxis    # # # CATS # # #    Past Medical History:  Diagnosis Date  . Anemia    h/o iron deficiency secondary to heavy menses  . Arthritis    left hand index finger  . Diffuse large B cell lymphoma ([place])   . Grief reaction 01/27/2016  . H/O measles    as a child  . History of chicken pox    as a child  . History of PCOS   .  Hyperlipidemia, mixed 01/27/2016  . Lymphadenopathy   . 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 Preventative health care 01/27/2016  . Vitamin D deficiency   . Wears glasses     Past Surgical History:  Procedure Laterality Date  . COLONOSCOPY    . IR GENERIC HISTORICAL  05/21/2016   IR FLUORO GUIDE PORT INSERTION RIGHT 05/21/2016 Arne Cleveland, MD [facility]  . IR GENERIC HISTORICAL  05/21/2016   IR US GUIDE VASC ACCESS RIGHT 05/21/2016 Arne Cleveland, MD [facility]  . IR REMOVAL TUN ACCESS W/ PORT W/O FL MOD SED  10/27/2016  . SKIN BIOPSY Left    face  . THYROGLOSSAL DUCT CYST N/A  04/16/2016   Procedure: THYROGLOSSAL DUCT CYST excision;  Surgeon: [name], MD;  Location: [facility] OR;  Service: ENT;  Laterality: N/A;    Family History  Problem Relation Age of Onset  . Arthritis Mother        rheumatoid  . Heart disease Father        CHF  . COPD Sister        Emphysema, h/o cigarettes  . Other Sister        pituatary tumor  . Arthritis Sister   . Obesity Sister   . Cancer Paternal Grandfather        cancer  . Stroke Maternal Aunt     Social History   Socioeconomic History  . Marital status: Married    Spouse name: Not on file  . Number of children: Not on file  . Years of education: Not on file  . Highest education level: Not on file  Social Needs  . Financial resource strain: Not on file  . Food insecurity - worry: Not on file  . Food insecurity - inability: Not on file  . Transportation needs - medical: Not on file  . Transportation needs - non-medical: Not on file  Occupational History  . Occupation: Psychotherapist  Tobacco Use  . Smoking status: Never Smoker  . Smokeless tobacco: Never Used  Substance and Sexual Activity  . Alcohol use: Yes    Alcohol/week: 1.8 - 3.6 oz    Types: 3 - 6 Standard drinks or equivalent per week    Comment: 5 glasses of wine a week.  . Drug use: No  . Sexual activity: No    Partners: Male    Birth control/protection: None  Other Topics Concern  . Not on file  Social History Narrative   Married. Education: [employer]. Exercise: walking, yoga, and bicycling daily for 1-2 hours. Lives alone, works as Management consultant    Past Medical History, Surgical history, Social history, and Family history were reviewed and updated as appropriate.   Please see review of systems for further details on the patient's review from today.   Review of Systems:  Review of Systems  Constitutional: Positive for fatigue and fever. Negative for chills and diaphoresis.  HENT: Positive for congestion and sore throat. Negative for  rhinorrhea and voice change.   Respiratory: Positive for cough. Negative for chest tightness, shortness of breath and wheezing.   Cardiovascular: Negative for chest pain, palpitations and leg swelling.  Musculoskeletal: Positive for myalgias.  Neurological: Negative for headaches.    Objective:   Physical Exam:  BP 111/71 (BP Location: Right Arm, Patient Position: Sitting)   Pulse 70   Temp 98.8 F (37.1 C) (Oral)   Resp 18   Ht 5' 8.6" (1.742 m)   Wt 161 lb 12.8 oz (73.4 kg)   SpO2  100%   BMI 24.17 kg/m  ECOG: 0  Physical Exam  Constitutional: No distress.  HENT:  Head: Normocephalic and atraumatic.  Right Ear: External ear normal.  Left Ear: External ear normal.  Mouth/Throat: Oropharynx is clear and moist. No oropharyngeal exudate.  Eyes: Right eye exhibits no discharge. Left eye exhibits no discharge. No scleral icterus.  Neck: Normal range of motion. Neck supple.  Cardiovascular: Normal rate, regular rhythm and normal heart sounds. Exam reveals no gallop and no friction rub.  No murmur heard. Pulmonary/Chest: Effort normal and breath sounds normal. No respiratory distress. She has no wheezes. She has no rales.  Musculoskeletal: She exhibits no edema.  Lymphadenopathy:    She has no cervical adenopathy.  Neurological: She is alert. Coordination normal.  Skin: Skin is warm and dry. No rash noted. She is not diaphoretic. No erythema.    Lab Review:     Component Value Date/Time   NA 137 06/10/2017 0922   NA 137 03/07/2017 0842   K 4.0 06/10/2017 0922   K 4.3 03/07/2017 0842   CL 101 06/10/2017 0922   CO2 26 06/10/2017 0922   CO2 21 (L) 03/07/2017 0842   GLUCOSE 93 06/10/2017 0922   GLUCOSE 123 03/07/2017 0842   BUN 14 06/10/2017 0922   BUN 14.0 03/07/2017 0842   CREATININE 0.74 06/10/2017 0922   CREATININE 0.7 03/07/2017 0842   CALCIUM 9.1 06/10/2017 0922   CALCIUM 10.2 03/07/2017 0842   PROT 6.8 06/10/2017 0922   PROT 7.2 03/07/2017 0842   ALBUMIN 3.8  06/10/2017 0922   ALBUMIN 4.3 03/07/2017 0842   AST 19 06/10/2017 0922   AST 15 03/07/2017 0842   ALT 21 06/10/2017 0922   ALT 25 03/07/2017 0842   ALKPHOS 84 06/10/2017 0922   ALKPHOS 131 03/07/2017 0842   BILITOT 0.5 06/10/2017 0922   BILITOT 0.45 03/07/2017 0842   GFRNONAA >60 06/10/2017 0922   GFRAA >60 06/10/2017 0922       Component Value Date/Time   WBC 3.7 (L) 06/10/2017 0922   WBC 9.2 05/09/2017 0730   RBC 4.43 06/10/2017 0922   HGB 14.0 05/09/2017 0730   HGB 13.3 03/07/2017 0842   HCT 40.2 06/10/2017 0922   HCT 39.1 03/07/2017 0842   PLT 187 06/10/2017 0922   PLT 283 03/07/2017 0842   MCV 90.7 06/10/2017 0922   MCV 91.4 03/07/2017 0842   MCH 31.4 06/10/2017 0922   MCHC 34.6 06/10/2017 0922   RDW 12.9 06/10/2017 0922   RDW 13.3 03/07/2017 0842   LYMPHSABS 0.7 (L) 06/10/2017 0922   LYMPHSABS 0.4 (L) 03/07/2017 0842   MONOABS 0.6 06/10/2017 0922   MONOABS 0.7 03/07/2017 0842   EOSABS 0.0 06/10/2017 0922   EOSABS 0.0 03/07/2017 0842   BASOSABS 0.0 06/10/2017 0922   BASOSABS 0.0 03/07/2017 0842   -------------------------------  Imaging from last 24 hours (if applicable):  Radiology interpretation: No results found.

## 2017-06-10 NOTE — Telephone Encounter (Signed)
TCT from patient this am stating that she has been experiencing fevers > 100 for the last 3 days. She c/o sore throat, worsening cough. Pt does sound hoarse on the phone.  Pt states that she spoke with Delle Reining, RN for Dr. Irene Limbo yesterday and was told to call back today if no better.  Advised pt to come on in for labs and for visit in the [facility] with Sandi Mealy, PA. Pt lives close and will be here soon.

## 2017-06-10 NOTE — Telephone Encounter (Signed)
Please advie

## 2017-06-12 LAB — CULTURE, GROUP A STREP (THRC)

## 2017-07-07 ENCOUNTER — Ambulatory Visit (HOSPITAL_COMMUNITY): Payer: Medicare Other

## 2017-07-07 ENCOUNTER — Ambulatory Visit (HOSPITAL_COMMUNITY)
Admission: RE | Admit: 2017-07-07 | Discharge: 2017-07-07 | Disposition: A | Discharge status: Home / Self Care | Payer: Medicare Other | Source: Ambulatory Visit | Attending: Hematology | Admitting: Hematology

## 2017-07-07 DIAGNOSIS — C8298 Follicular lymphoma, unspecified, lymph nodes of multiple sites: Secondary | ICD-10-CM | POA: Diagnosis not present

## 2017-07-07 DIAGNOSIS — C8512 Unspecified B-cell lymphoma, intrathoracic lymph nodes: Secondary | ICD-10-CM | POA: Diagnosis not present

## 2017-07-07 DIAGNOSIS — K449 Diaphragmatic hernia without obstruction or gangrene: Secondary | ICD-10-CM | POA: Diagnosis not present

## 2017-07-07 MED ORDER — IOPAMIDOL (ISOVUE-300) INJECTION 61%
INTRAVENOUS | Status: AC
  Filled 2017-07-07: qty 100

## 2017-07-07 MED ORDER — IOPAMIDOL (ISOVUE-300) INJECTION 61%
100.0000 mL | Freq: Once | INTRAVENOUS | Status: AC | PRN
  Administered 2017-07-07: 100 mL via INTRAVENOUS

## 2017-07-07 MED ORDER — SODIUM CHLORIDE 0.9 % IJ SOLN
INTRAMUSCULAR | Status: AC
  Filled 2017-07-07: qty 50

## 2017-07-07 NOTE — Progress Notes (Signed)
[name]     HEMATOLOGY/ONCOLOGY CLINIC NOTE  Date of Service: 07/11/17   Patient Care Team: [name], MD as PCP - General (Family Medicine) [name] M.D. (ENT)  CHIEF COMPLAINTS/PURPOSE OF CONSULTATION:   F/u for follicular lymphoma/DLBCL  HISTORY OF PRESENTING ILLNESS:   Please see previous note for details of initial presentation.  INTERVAL HISTORY   Ms Will is here for follow-up of her follicular lymphoma with large cell transformation. She has completed her 6 cycles of R CHOP and is currently in complete remission. She presents today for her fifth round of maintenance Rituxan. The patient's last visit with [place] was on 05/09/17. The pt reports that she is doing well overall. She notes that a couple weeks ago she came down with an Upper respiratory infection and received antibiotics that have helped her symptoms resolve.   Of note since the patient's last visit, pt has had a CT on C/A/P completed on 07/07/17 with results revealing No evidence for recurrent lymphoma within the chest, abdomen or pelvis.  Lab results today (07/11/17) of CBC, CMP, and Reticulocytes is as follows: all values are WNL except for Neutro Abs at 9.4k, Lymphs Abs at 0.5k, CO2 at 21, Glucose at 151. LDH 07/11/17 is WNL at 187.  On review of systems, pt denies fevers, chills, sore throat, back pains, abdominal pains, leg swelling, and any other symptoms.    MEDICAL HISTORY:  Past Medical History:  Diagnosis Date  . Anemia    h/o iron deficiency secondary to heavy menses  . Arthritis    left hand index finger  . Diffuse large B cell lymphoma ([place])   . Grief reaction 01/27/2016  . H/O measles    as a child  . History of chicken pox    as a child  . History of PCOS   . Hyperlipidemia, mixed 01/27/2016  . Lymphadenopathy   . 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 Preventative health care 01/27/2016  . Vitamin D deficiency   . Wears glasses     SURGICAL HISTORY: Past Surgical History:  Procedure  Laterality Date  . COLONOSCOPY    . IR GENERIC HISTORICAL  05/21/2016   IR FLUORO GUIDE PORT INSERTION RIGHT 05/21/2016 Arne Cleveland, MD [facility]  . IR GENERIC HISTORICAL  05/21/2016   IR US GUIDE VASC ACCESS RIGHT 05/21/2016 Arne Cleveland, MD [facility]  . IR REMOVAL TUN ACCESS W/ PORT W/O FL MOD SED  10/27/2016  . SKIN BIOPSY Left    face  . THYROGLOSSAL DUCT CYST N/A 04/16/2016   Procedure: THYROGLOSSAL DUCT CYST excision;  Surgeon: [name], MD;  Location: [facility];  Service: ENT;  Laterality: N/A;    SOCIAL HISTORY: Social History   Socioeconomic History  . Marital status: Married    Spouse name: Not on file  . Number of children: Not on file  . Years of education: Not on file  . Highest education level: Not on file  Social Needs  . Financial resource strain: Not on file  . Food insecurity - worry: Not on file  . Food insecurity - inability: Not on file  . Transportation needs - medical: Not on file  . Transportation needs - non-medical: Not on file  Occupational History  . Occupation: Psychotherapist  Tobacco Use  . Smoking status: Never Smoker  . Smokeless tobacco: Never Used  Substance and Sexual Activity  . Alcohol use: Yes    Alcohol/week: 1.8 - 3.6 oz    Types: 3 - 6  Standard drinks or equivalent per week    Comment: 5 glasses of wine a week.  . Drug use: No  . Sexual activity: No    Partners: Male    Birth control/protection: None  Other Topics Concern  . Not on file  Social History Narrative   Married. Education: [employer]. Exercise: walking, yoga, and bicycling daily for 1-2 hours. Lives alone, works as Management consultant    FAMILY HISTORY: Family History  Problem Relation Age of Onset  . Arthritis Mother        rheumatoid  . Heart disease Father        CHF  . COPD Sister        Emphysema, h/o cigarettes  . Other Sister        pituatary tumor  . Arthritis Sister   . Obesity Sister   . Cancer Paternal Grandfather        cancer  . Stroke  Maternal Aunt     ALLERGIES:  is allergic to erythromycin and other.  MEDICATIONS:  Current Outpatient Medications  Medication Sig Dispense Refill  . Calcium-Magnesium-Vitamin D (CALCIUM MAGNESIUM PO) Take 1 tablet by mouth daily.    . Cholecalciferol (VITAMIN D) 2000 units CAPS Take 2,000 Units by mouth daily.    [name] dexamethasone (DECADRON) 4 MG tablet TAKE 3 TABLETS BY MOUTH TWICE DAILY, TAKE 1 DOSE IN THE MORNING AND 1 DOSE IN THE EVENING THE DAY PRIOR TO Rituxan 12 tablet 6  . Omega-3 Fatty Acids (OMEGA-3 PO) Take 2 capsules by mouth daily. Omega 3 1280 mg    . HYDROcodone-homatropine (HYCODAN) 5-1.5 MG/5ML syrup Take 5 mLs by mouth every 6 (six) hours as needed for cough. (Patient not taking: Reported on 07/11/2017) 120 mL 0  . magic mouthwash w/lidocaine SOLN Take 5 mLs by mouth 4 (four) times daily as needed for mouth pain. (Patient not taking: Reported on 07/11/2017) 240 mL 0   No current facility-administered medications for this visit.     REVIEW OF SYSTEMS:    .10 Point review of Systems was done is negative except as noted above.   PHYSICAL EXAMINATION:  ECOG PERFORMANCE STATUS: 0 - Asymptomatic  Vitals:   07/11/17 0838  BP: 120/71  Pulse: 65  Resp: 20  Temp: 98.7 F (37.1 C)  SpO2: 100%   Filed Weights   07/11/17 0838  Weight: 166 lb 14.4 oz (75.7 kg)   .Body mass index is 25.01 kg/m.  [name] GENERAL:alert, in no acute distress and comfortable SKIN: no acute rashes, no significant lesions EYES: conjunctiva are pink and non-injected, sclera anicteric OROPHARYNX: MMM, no exudates, no oropharyngeal erythema or ulceration NECK: supple, no JVD LYMPH:  no palpable lymphadenopathy in the cervical, axillary or inguinal regions LUNGS: clear to auscultation b/l with normal respiratory effort HEART: regular rate & rhythm ABDOMEN:  normoactive bowel sounds , non tender, not distended. Extremity: no pedal edema PSYCH: alert & oriented x 3 with fluent speech NEURO: no  focal motor/sensory deficits    LABORATORY DATA:  I have reviewed the data as listed  . CBC Latest Ref Rng & Units 07/11/2017 06/10/2017 05/09/2017  WBC 3.9 - 10.3 K/uL 10.3 3.7(L) 9.2  Hemoglobin 11.6 - 15.9 g/dL - - 14.0  Hematocrit 34.8 - 46.6 % 38.8 40.2 40.5  Platelets 145 - 400 K/uL 291 187 256  HGB 13.2 . CBC    Component Value Date/Time   WBC 10.3 07/11/2017 0757   WBC 9.2 05/09/2017 0730   RBC 4.24 07/11/2017 0757  RBC 4.24 07/11/2017 0757   HGB 14.0 05/09/2017 0730   HGB 13.3 03/07/2017 0842   HCT 38.8 07/11/2017 0757   HCT 39.1 03/07/2017 0842   PLT 291 07/11/2017 0757   PLT 283 03/07/2017 0842   MCV 91.5 07/11/2017 0757   MCV 91.4 03/07/2017 0842   MCH 31.1 07/11/2017 0757   MCHC 34.0 07/11/2017 0757   RDW 13.5 07/11/2017 0757   RDW 13.3 03/07/2017 0842   LYMPHSABS 0.5 (L) 07/11/2017 0757   LYMPHSABS 0.4 (L) 03/07/2017 0842   MONOABS 0.4 07/11/2017 0757   MONOABS 0.7 03/07/2017 0842   EOSABS 0.0 07/11/2017 0757   EOSABS 0.0 03/07/2017 0842   BASOSABS 0.0 07/11/2017 0757   BASOSABS 0.0 03/07/2017 0842    . CMP Latest Ref Rng & Units 07/11/2017 06/10/2017 05/09/2017  Glucose 70 - 140 mg/dL 152(H) 93 154(H)  BUN 7 - 26 mg/dL 13 14 15   Creatinine 0.60 - 1.10 mg/dL 0.74 0.74 0.84  Sodium 136 - 145 mmol/L 138 137 140  Potassium 3.5 - 5.1 mmol/L 3.8 4.0 3.9  Chloride 98 - 109 mmol/L 105 101 105  CO2 22 - 29 mmol/L 21(L) 26 23  Calcium 8.4 - 10.4 mg/dL 10.3 9.1 9.9  Total Protein 6.4 - 8.3 g/dL 7.1 6.8 7.1  Total Bilirubin 0.2 - 1.2 mg/dL 0.6 0.5 0.5  Alkaline Phos 40 - 150 U/L 110 84 144  AST 5 - 34 U/L 16 19 13   ALT 0 - 55 U/L 22 21 24    Component     Latest Ref Rng & Units 05/07/2016  Hepatitis B Surface Ag     Negative Negative  Hep B Core Ab, Tot     Negative Negative  Hep C Virus Ab     0.0 - 0.9 s/co ratio <0.1   . Lab Results  Component Value Date   LDH 185 05/09/2017         RADIOGRAPHIC STUDIES:   Ct Chest W Contrast  Result  Date: 07/07/2017 CLINICAL DATA:  Patient with history of diffuse large B-cell lymphoma. EXAM: CT CHEST, ABDOMEN, AND PELVIS WITH CONTRAST TECHNIQUE: Multidetector CT imaging of the chest, abdomen and pelvis was performed following the standard protocol during bolus administration of intravenous contrast. CONTRAST:  180m ISOVUE-300 IOPAMIDOL (ISOVUE-300) INJECTION 61% COMPARISON:  PET/CT 10/11/2016. FINDINGS: CT CHEST FINDINGS Cardiovascular: Normal heart size. No pericardial effusion. Aorta and main pulmonary artery normal in caliber. Thoracic aortic vascular calcifications. Mediastinum/Nodes: No enlarged axillary, mediastinal or hilar lymphadenopathy. Small hiatal hernia. Lungs/Pleura: Central airways are patent. Dependent atelectasis within the bilateral lower lobes. No pleural effusion or pneumothorax. Musculoskeletal: Thoracic spine degenerative changes. No aggressive or acute appearing osseous lesions. CT ABDOMEN PELVIS FINDINGS Hepatobiliary: Stable 1.3 cm cyst left hepatic lobe. Gallbladder is unremarkable. No intrahepatic or extrahepatic biliary ductal dilatation. Pancreas: Unremarkable Spleen: Unremarkable Adrenals/Urinary Tract: Adrenal glands are normal. Kidneys enhance symmetrically with contrast. No hydronephrosis. Urinary bladder is unremarkable. Stomach/Bowel: No abnormal bowel wall thickening or evidence for bowel obstruction. No free fluid or free intraperitoneal air. Normal morphology of the stomach. Normal appendix. Vascular/Lymphatic: Normal caliber abdominal aorta. Peripheral calcified atherosclerotic plaque. No retroperitoneal lymphadenopathy. Reproductive: Unremarkable Other: None. Musculoskeletal: Lumbar spine degenerative changes. No aggressive or acute appearing osseous lesions. IMPRESSION: No evidence for recurrent lymphoma within the chest, abdomen or pelvis. Electronically Signed   By: DLovey NewcomerM.D.   On: 07/07/2017 14:16   Ct Abdomen Pelvis W Contrast  Result Date:  07/07/2017 CLINICAL DATA:  Patient with  history of diffuse large B-cell lymphoma. EXAM: CT CHEST, ABDOMEN, AND PELVIS WITH CONTRAST TECHNIQUE: Multidetector CT imaging of the chest, abdomen and pelvis was performed following the standard protocol during bolus administration of intravenous contrast. CONTRAST:  173m ISOVUE-300 IOPAMIDOL (ISOVUE-300) INJECTION 61% COMPARISON:  PET/CT 10/11/2016. FINDINGS: CT CHEST FINDINGS Cardiovascular: Normal heart size. No pericardial effusion. Aorta and main pulmonary artery normal in caliber. Thoracic aortic vascular calcifications. Mediastinum/Nodes: No enlarged axillary, mediastinal or hilar lymphadenopathy. Small hiatal hernia. Lungs/Pleura: Central airways are patent. Dependent atelectasis within the bilateral lower lobes. No pleural effusion or pneumothorax. Musculoskeletal: Thoracic spine degenerative changes. No aggressive or acute appearing osseous lesions. CT ABDOMEN PELVIS FINDINGS Hepatobiliary: Stable 1.3 cm cyst left hepatic lobe. Gallbladder is unremarkable. No intrahepatic or extrahepatic biliary ductal dilatation. Pancreas: Unremarkable Spleen: Unremarkable Adrenals/Urinary Tract: Adrenal glands are normal. Kidneys enhance symmetrically with contrast. No hydronephrosis. Urinary bladder is unremarkable. Stomach/Bowel: No abnormal bowel wall thickening or evidence for bowel obstruction. No free fluid or free intraperitoneal air. Normal morphology of the stomach. Normal appendix. Vascular/Lymphatic: Normal caliber abdominal aorta. Peripheral calcified atherosclerotic plaque. No retroperitoneal lymphadenopathy. Reproductive: Unremarkable Other: None. Musculoskeletal: Lumbar spine degenerative changes. No aggressive or acute appearing osseous lesions. IMPRESSION: No evidence for recurrent lymphoma within the chest, abdomen or pelvis. Electronically Signed   By: DLovey NewcomerM.D.   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  Initial PET/CT showed Scattered small mildly hypermetabolic lymph nodes within the neck, left hilum, left axilla and left groin, potentially related the patient's lymphoma. Focal hypermetabolic activity within the left sternal manubrium and left iliac bone, also potentially related to the patient's lymphoma. No evidence of solid visceral organ involvement.  CT guided bone marrow biopsy done -- no evidence of lymphoma involving Bone marrow. Normal LDH. No constitutional symptoms. Patient has no significant medical comorbidities at baseline.  ECHO with nl EF as expected.  Patient has completed 6 cycles of R CHOP  PET/CT s/p 6 cycles of R-CHOP shows complete metabolic response  . Lab Results  Component Value Date   LDH 185 05/09/2017   2) Rituxan hypersensitivity (mild grade 1 hives on the back  - resolved with solumedrol and antihistamines. No issues with dexamethasone pretreatment -Patient will continue dexamethasone premedication to reduce risk of Rituxan hypersensitivity.  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 -Counseled on infection prevention strategies. She is up to date on her vaccinations.  -Continue on schedule for maintenance Rituxan q 60 days with dexamethasone pre-medications for a total of 2 years as of now.  -Labs and clinic visit with Dr KIrene Limboq 662days with each treatment.  -Discussed pt labwork today; blood counts are all normal except borderline elevated neutrophils at 9.4k, which is expected given her recent infection.  -Discussed 07/07/17 CT revealing No evidence for recurrent lymphoma within C/A/P. -Will do repeat CT scans every 6  months, earlier if concerning symptoms present.  -No prohibitive toxicities from continuing next cycle of maintenance Rituxan.   Continue Rituxan q2 months RTC with Dr KIrene Limboin 2 months with labs with next dose of Rituxan  . No orders of the defined types were  placed in this encounter.   All of the patients questions were answered with apparent satisfaction. The patient knows to call the clinic with any problems, questions or concerns.  . The total time spent in the appointment was 25 minutes and more than 50% was on counseling and direct patient cares.    Sullivan Lone MD [place] AAHIVMS [facility] Hematology/Oncology Physician [place]  (Office):       [phone] (Work cell):  [phone] (Fax):           [phone]  This document serves as a record of services personally performed by Sullivan Lone, MD. It was created on his behalf by Baldwin Jamaica, a trained medical scribe. The creation of this record is based on the scribe's personal observations and the provider's statements to them.   .I have reviewed the above documentation for accuracy and completeness, and I agree with the above. Brunetta Genera MD MS

## 2017-07-08 ENCOUNTER — Other Ambulatory Visit: Payer: Self-pay | Admitting: *Deleted

## 2017-07-11 ENCOUNTER — Encounter: Payer: Self-pay | Admitting: Hematology

## 2017-07-11 ENCOUNTER — Inpatient Hospital Stay: Payer: Medicare Other | Attending: Hematology

## 2017-07-11 ENCOUNTER — Encounter: Payer: Medicare Other | Admitting: Nutrition

## 2017-07-11 ENCOUNTER — Inpatient Hospital Stay: Payer: Medicare Other

## 2017-07-11 ENCOUNTER — Inpatient Hospital Stay (HOSPITAL_BASED_OUTPATIENT_CLINIC_OR_DEPARTMENT_OTHER): Payer: Medicare Other | Admitting: Hematology

## 2017-07-11 VITALS — BP 120/71 | HR 65 | Temp 98.7°F | Resp 20 | Ht 68.5 in | Wt 166.9 lb

## 2017-07-11 VITALS — BP 129/55 | HR 64 | Temp 98.4°F | Resp 17

## 2017-07-11 DIAGNOSIS — Z5112 Encounter for antineoplastic immunotherapy: Secondary | ICD-10-CM | POA: Diagnosis not present

## 2017-07-11 DIAGNOSIS — C8330 Diffuse large B-cell lymphoma, unspecified site: Secondary | ICD-10-CM | POA: Insufficient documentation

## 2017-07-11 DIAGNOSIS — G620 Drug-induced polyneuropathy: Secondary | ICD-10-CM

## 2017-07-11 DIAGNOSIS — C8298 Follicular lymphoma, unspecified, lymph nodes of multiple sites: Secondary | ICD-10-CM

## 2017-07-11 DIAGNOSIS — G629 Polyneuropathy, unspecified: Secondary | ICD-10-CM

## 2017-07-11 LAB — COMPREHENSIVE METABOLIC PANEL
ALT: 22 U/L (ref 0–55)
AST: 16 U/L (ref 5–34)
Albumin: 4.2 g/dL (ref 3.5–5.0)
Alkaline Phosphatase: 110 U/L (ref 40–150)
Anion gap: 12 — ABNORMAL HIGH (ref 3–11)
BUN: 13 mg/dL (ref 7–26)
CO2: 21 mmol/L — ABNORMAL LOW (ref 22–29)
Calcium: 10.3 mg/dL (ref 8.4–10.4)
Chloride: 105 mmol/L (ref 98–109)
Creatinine, Ser: 0.74 mg/dL (ref 0.60–1.10)
GFR calc Af Amer: >60 mL/min (ref >60)
GFR calc non Af Amer: >60 mL/min (ref >60)
Glucose, Bld: 152 mg/dL — ABNORMAL HIGH (ref 70–140)
Potassium: 3.8 mmol/L (ref 3.5–5.1)
Sodium: 138 mmol/L (ref 136–145)
Total Bilirubin: 0.6 mg/dL (ref 0.2–1.2)
Total Protein: 7.1 g/dL (ref 6.4–8.3)

## 2017-07-11 LAB — CBC WITH DIFFERENTIAL (CANCER CENTER ONLY)
Basophils Absolute: 0 10*3/uL (ref 0.0–0.1)
Basophils Relative: 0 %
Eosinophils Absolute: 0 10*3/uL (ref 0.0–0.5)
Eosinophils Relative: 0 %
HCT: 38.8 % (ref 34.8–46.6)
Hemoglobin: 13.2 g/dL (ref 11.6–15.9)
Lymphocytes Relative: 4 %
Lymphs Abs: 0.5 10*3/uL — ABNORMAL LOW (ref 0.9–3.3)
MCH: 31.1 pg (ref 25.1–34.0)
MCHC: 34 g/dL (ref 31.5–36.0)
MCV: 91.5 fL (ref 79.5–101.0)
Monocytes Absolute: 0.4 10*3/uL (ref 0.1–0.9)
Monocytes Relative: 4 %
Neutro Abs: 9.4 10*3/uL — ABNORMAL HIGH (ref 1.5–6.5)
Neutrophils Relative %: 92 %
Platelet Count: 291 10*3/uL (ref 145–400)
RBC: 4.24 MIL/uL (ref 3.70–5.45)
RDW: 13.5 % (ref 11.2–14.5)
WBC Count: 10.3 10*3/uL (ref 3.9–10.3)

## 2017-07-11 LAB — RETICULOCYTES
RBC.: 4.24 MIL/uL (ref 3.70–5.45)
Retic Count, Absolute: 50.9 10*3/uL (ref 33.7–90.7)
Retic Ct Pct: 1.2 % (ref 0.7–2.1)

## 2017-07-11 LAB — LACTATE DEHYDROGENASE: LDH: 187 U/L (ref 125–245)

## 2017-07-11 MED ORDER — DIPHENHYDRAMINE HCL 25 MG PO CAPS
50.0000 mg | ORAL_CAPSULE | Freq: Once | ORAL | Status: AC
  Administered 2017-07-11: 50 mg via ORAL

## 2017-07-11 MED ORDER — RITUXIMAB CHEMO INJECTION 500 MG/50ML
375.0000 mg/m2 | Freq: Once | INTRAVENOUS | Status: AC
  Administered 2017-07-11: 700 mg via INTRAVENOUS
  Filled 2017-07-11: qty 70

## 2017-07-11 MED ORDER — SODIUM CHLORIDE 0.9 % IV SOLN
Freq: Once | INTRAVENOUS | Status: AC
  Administered 2017-07-11: 10:00:00 via INTRAVENOUS

## 2017-07-11 MED ORDER — ACETAMINOPHEN 325 MG PO TABS
650.0000 mg | ORAL_TABLET | Freq: Once | ORAL | Status: AC
  Administered 2017-07-11: 650 mg via ORAL

## 2017-07-11 MED ORDER — DEXAMETHASONE SODIUM PHOSPHATE 100 MG/10ML IJ SOLN
12.0000 mg | Freq: Once | INTRAMUSCULAR | Status: AC
  Administered 2017-07-11: 12 mg via INTRAVENOUS
  Filled 2017-07-11: qty 1.2

## 2017-07-11 MED ORDER — ACETAMINOPHEN 325 MG PO TABS
ORAL_TABLET | ORAL | Status: AC
  Filled 2017-07-11: qty 2

## 2017-07-11 MED ORDER — DIPHENHYDRAMINE HCL 25 MG PO CAPS
ORAL_CAPSULE | ORAL | Status: AC
  Filled 2017-07-11: qty 2

## 2017-07-11 NOTE — Patient Instructions (Signed)
[facility] Discharge Instructions for Patients Receiving Chemotherapy  Today you received the following chemotherapy agents:  Rituxan   To help prevent nausea and vomiting after your treatment, we encourage you to take your nausea medication as prescribed.   If you develop nausea and vomiting that is not controlled by your nausea medication, call the clinic.   BELOW ARE SYMPTOMS THAT SHOULD BE REPORTED IMMEDIATELY:  *FEVER GREATER THAN 100.5 F  *CHILLS WITH OR WITHOUT FEVER  NAUSEA AND VOMITING THAT IS NOT CONTROLLED WITH YOUR NAUSEA MEDICATION  *UNUSUAL SHORTNESS OF BREATH  *UNUSUAL BRUISING OR BLEEDING  TENDERNESS IN MOUTH AND THROAT WITH OR WITHOUT PRESENCE OF ULCERS  *URINARY PROBLEMS  *BOWEL PROBLEMS  UNUSUAL RASH Items with * indicate a potential emergency and should be followed up as soon as possible.  Feel free to call the clinic should you have any questions or concerns. The clinic phone number is [phone].  Please show the CHEMO ALERT CARD at check-in to the Emergency Department and triage nurse.   

## 2017-07-25 ENCOUNTER — Ambulatory Visit: Payer: Medicare Other | Admitting: Family Medicine

## 2017-09-05 ENCOUNTER — Ambulatory Visit: Payer: Medicare Other | Admitting: Family Medicine

## 2017-09-07 ENCOUNTER — Other Ambulatory Visit: Payer: Self-pay | Admitting: Hematology

## 2017-09-07 ENCOUNTER — Encounter: Payer: Self-pay | Admitting: Hematology

## 2017-09-08 ENCOUNTER — Other Ambulatory Visit: Payer: Self-pay | Admitting: *Deleted

## 2017-09-08 MED ORDER — DEXAMETHASONE 4 MG PO TABS: ORAL_TABLET | ORAL | 6 refills | Status: DC

## 2017-09-08 NOTE — Progress Notes (Signed)
[name]     HEMATOLOGY/ONCOLOGY CLINIC NOTE  Date of Service: 09/12/17   Patient Care Team: [name], MD as PCP - General (Family Medicine) [name] M.D. (ENT)  CHIEF COMPLAINTS/PURPOSE OF CONSULTATION:   F/u for follicular lymphoma/DLBCL  HISTORY OF PRESENTING ILLNESS:   Please see previous note for details of initial presentation.  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 She has completed her 6 cycles of R CHOP and is currently in complete remission. She presents today for her sixth round of maintenance Rituxan. The patient's last visit with [place] was on 07/11/17. The pt reports that she is doing well overall and enjoyed her recent trip to an art exhibit in [place].   The pt reports that she has been walking frequently and is hoping to begin going back to the gym and cycling. She notes that she has night sweats every now and then but reports no other concerns.  Lab results today (09/12/17) of CBC, CMP, and Reticulocytes is as follows: all values are WNL except for ANC at 8.2k, Lymphs Abs at 0.3k, Retic ct abs at 32.9.  On review of systems, pt reports good energy levels, and denies fevers, chills, chest pain, abdominal pains, shortness of breath, and any other symptoms.    MEDICAL HISTORY:  Past Medical History:  Diagnosis Date  . Anemia    h/o iron deficiency secondary to heavy menses  . Arthritis    left hand index finger  . Diffuse large B cell lymphoma ([place])   . Grief reaction 01/27/2016  . H/O measles    as a child  . History of chicken pox    as a child  . History of PCOS   . Hyperlipidemia, mixed 01/27/2016  . Lymphadenopathy   . 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 Preventative health care 01/27/2016  . Vitamin D deficiency   . Wears glasses     SURGICAL HISTORY: Past Surgical History:  Procedure Laterality Date  . COLONOSCOPY    . IR GENERIC HISTORICAL  05/21/2016   IR FLUORO GUIDE  PORT INSERTION RIGHT 05/21/2016 Arne Cleveland, MD [facility]  . IR GENERIC HISTORICAL  05/21/2016   IR US GUIDE VASC ACCESS RIGHT 05/21/2016 Arne Cleveland, MD [facility]  . IR REMOVAL TUN ACCESS W/ PORT W/O FL MOD SED  10/27/2016  . SKIN BIOPSY Left    face  . THYROGLOSSAL DUCT CYST N/A 04/16/2016   Procedure: THYROGLOSSAL DUCT CYST excision;  Surgeon: [name], MD;  Location: [facility];  Service: ENT;  Laterality: N/A;    SOCIAL HISTORY: Social History   Socioeconomic History  . Marital status: Married    Spouse name: Not on file  . Number of children: Not on file  . Years of education: Not on file  . Highest education level: Not on file  Occupational History  . Occupation: Psychotherapist  Social Needs  . Financial resource strain: Not on file  . Food insecurity:    Worry: Not on file    Inability: Not on file  . Transportation needs:    Medical: Not on file    Non-medical: Not on file  Tobacco Use  . Smoking status: Never Smoker  . Smokeless tobacco: Never Used  Substance and Sexual Activity  . Alcohol use: Yes    Alcohol/week: 1.8 - 3.6 oz    Types: 3 - 6 Standard drinks or equivalent per week    Comment: 5  glasses of wine a week.  . Drug use: No  . Sexual activity: Never    Partners: Male    Birth control/protection: None  Lifestyle  . Physical activity:    Days per week: Not on file    Minutes per session: Not on file  . Stress: Not on file  Relationships  . Social connections:    Talks on phone: Not on file    Gets together: Not on file    Attends religious service: Not on file    Active member of club or organization: Not on file    Attends meetings of clubs or organizations: Not on file    Relationship status: Not on file  . Intimate partner violence:    Fear of current or ex partner: Not on file    Emotionally abused: Not on file    Physically abused: Not on file    Forced sexual activity: Not on file  Other Topics Concern  . Not on file    Social History Narrative   Married. Education: [employer]. Exercise: walking, yoga, and bicycling daily for 1-2 hours. Lives alone, works as Management consultant    FAMILY HISTORY: Family History  Problem Relation Age of Onset  . Arthritis Mother        rheumatoid  . Heart disease Father        CHF  . COPD Sister        Emphysema, h/o cigarettes  . Other Sister        pituatary tumor  . Arthritis Sister   . Obesity Sister   . Cancer Paternal Grandfather        cancer  . Stroke Maternal Aunt     ALLERGIES:  is allergic to erythromycin and other.  MEDICATIONS:  Current Outpatient Medications  Medication Sig Dispense Refill  . Calcium-Magnesium-Vitamin D (CALCIUM MAGNESIUM PO) Take 1 tablet by mouth daily.    . Cholecalciferol (VITAMIN D) 2000 units CAPS Take 2,000 Units by mouth daily.    [name] dexamethasone (DECADRON) 4 MG tablet TAKE 3 TABLETS BY MOUTH TWICE DAILY, TAKE 1 DOSE IN THE MORNING AND 1 DOSE IN THE EVENING THE DAY PRIOR TO Rituxan 12 tablet 6  . HYDROcodone-homatropine (HYCODAN) 5-1.5 MG/5ML syrup Take 5 mLs by mouth every 6 (six) hours as needed for cough. (Patient not taking: Reported on 07/11/2017) 120 mL 0  . magic mouthwash w/lidocaine SOLN Take 5 mLs by mouth 4 (four) times daily as needed for mouth pain. (Patient not taking: Reported on 07/11/2017) 240 mL 0  . Omega-3 Fatty Acids (OMEGA-3 PO) Take 2 capsules by mouth daily. Omega 3 1280 mg     No current facility-administered medications for this visit.     REVIEW OF SYSTEMS:   A 10+ POINT REVIEW OF SYSTEMS WAS OBTAINED including neurology, dermatology, psychiatry, cardiac, respiratory, lymph, extremities, GI, GU, Musculoskeletal, constitutional, breasts, reproductive, HEENT.  All pertinent positives are noted in the HPI.  All others are negative.    PHYSICAL EXAMINATION:  ECOG PERFORMANCE STATUS: 0 - Asymptomatic  Vitals:   09/12/17 0839  BP: 134/76  Pulse: 60  Resp: 17  Temp: 98.2 F (36.8 C)  SpO2: 100%    Filed Weights   09/12/17 0839  Weight: 167 lb 3.2 oz (75.8 kg)   .Body mass index is 27.15 kg/m.  [name] GENERAL:alert, in no acute distress and comfortable SKIN: no acute rashes, no significant lesions EYES: conjunctiva are pink and non-injected, sclera anicteric OROPHARYNX: MMM, no exudates, no oropharyngeal  erythema or ulceration NECK: supple, no JVD LYMPH:  no palpable lymphadenopathy in the cervical, axillary or inguinal regions LUNGS: clear to auscultation b/l with normal respiratory effort HEART: regular rate & rhythm ABDOMEN:  normoactive bowel sounds , non tender, not distended. Extremity: no pedal edema PSYCH: alert & oriented x 3 with fluent speech NEURO: no focal motor/sensory deficits   LABORATORY DATA:  I have reviewed the data as listed  . CBC Latest Ref Rng & Units 09/12/2017 07/11/2017 06/10/2017  WBC 3.9 - 10.3 K/uL 8.9 10.3 3.7(L)  Hemoglobin 11.6 - 15.9 g/dL 13.0 13.2 13.9  Hematocrit 34.8 - 46.6 % 38.4 38.8 40.2  Platelets 145 - 400 K/uL 286 291 187   CBC    Component Value Date/Time   WBC 8.9 09/12/2017 0744   RBC 4.11 09/12/2017 0744   RBC 4.11 09/12/2017 0744   HGB 13.0 09/12/2017 0744   HGB 13.2 07/11/2017 0757   HGB 13.3 03/07/2017 0842   HCT 38.4 09/12/2017 0744   HCT 39.1 03/07/2017 0842   PLT 286 09/12/2017 0744   PLT 291 07/11/2017 0757   PLT 283 03/07/2017 0842   MCV 93.4 09/12/2017 0744   MCV 91.4 03/07/2017 0842   MCH 31.6 09/12/2017 0744   MCHC 33.9 09/12/2017 0744   RDW 13.3 09/12/2017 0744   RDW 13.3 03/07/2017 0842   LYMPHSABS 0.3 (L) 09/12/2017 0744   LYMPHSABS 0.4 (L) 03/07/2017 0842   MONOABS 0.4 09/12/2017 0744   MONOABS 0.7 03/07/2017 0842   EOSABS 0.0 09/12/2017 0744   EOSABS 0.0 03/07/2017 0842   BASOSABS 0.0 09/12/2017 0744   BASOSABS 0.0 03/07/2017 0842    . CMP Latest Ref Rng & Units 09/12/2017 07/11/2017 06/10/2017  Glucose 70 - 140 mg/dL 137 152(H) 93  BUN 7 - 26 mg/dL _0 Creatinine 0.60 - 1.10 mg/dL 0.74  0.74 0.74  Sodium 136 - 145 mmol/L 136 138 137  Potassium 3.5 - 5.1 mmol/L 4.2 3.8 4.0  Chloride 98 - 109 mmol/L 105 105 101  CO2 22 - 29 mmol/L 22 21(L) 26  Calcium 8.4 - 10.4 mg/dL 9.9 10.3 9.1  Total Protein 6.4 - 8.3 g/dL 6.9 7.1 6.8  Total Bilirubin 0.2 - 1.2 mg/dL 0.5 0.6 0.5  Alkaline Phos 40 - 150 U/L 116 110 84  AST 5 - 34 U/L _1 ALT 0 - 55 U/L _2 Component     Latest Ref Rng & Units 05/07/2016  Hepatitis B Surface Ag     Negative Negative  Hep B Core Ab, Tot     Negative Negative  Hep C Virus Ab     0.0 - 0.9 s/co ratio <0.1   . Lab Results  Component Value Date   LDH 187 09/12/2017         RADIOGRAPHIC STUDIES:   No results found.  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  Initial PET/CT showed Scattered small mildly hypermetabolic lymph nodes within the neck, left hilum, left axilla and left groin, potentially related the patient's lymphoma. Focal hypermetabolic activity within the left sternal manubrium and left iliac bone, also potentially related to the patient's lymphoma. No evidence of solid visceral organ involvement.  CT guided bone marrow biopsy done -- no evidence of lymphoma involving Bone marrow. Normal LDH. No constitutional symptoms. Patient has no  significant medical comorbidities at baseline.  ECHO with nl EF as expected.  Patient has completed 6 cycles of R CHOP  PET/CT s/p 6 cycles of R-CHOP shows complete metabolic response  .CT chest/abd/pelvis 07/07/2017 - showed no radipgraphic evidence of lymphoma progression at this time. Lab Results  Component Value Date   LDH 187 07/11/2017   2) Rituxan hypersensitivity (mild grade 1 hives on the back  - resolved with solumedrol and antihistamines. No issues with dexamethasone pretreatment -Patient will continue dexamethasone  premedication to reduce risk of Rituxan hypersensitivity.  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  -The pt has no prohibitive toxicities from continuing maintenance Rituxan at this time.  -Counseled on infection prevention strategies. She is up to date on her vaccinations.  -Continue on schedule for maintenance Rituxan q 60 days with dexamethasone pre-medications for a total of 2 years as of now.  -Labs and clinic visit with Dr Irene Limbo q 60 days with each treatment.  --Will do repeat CT scans every 6 months, earlier if concerning symptoms present.   Please schedule next 3 doses of maintenance Rituxan q60 days RTC with Dr Irene Limbo with each maintenance rituxan with labs q60 days   . Orders Placed This Encounter  Procedures  . CBC with Differential/Platelet    Standing Status:   Future    Standing Expiration Date:   10/17/2018  . CMP ([place] only)    Standing Status:   Future    Standing Expiration Date:   09/13/2018  . Lactate dehydrogenase    Standing Status:   Future    Standing Expiration Date:   09/13/2018    All of the patients questions were answered with apparent satisfaction. The patient knows to call the clinic with any problems, questions or concerns.  . The total time spent in the appointment was 20 minutes and more than 50% was on counseling and direct patient cares.    Sullivan Lone MD [place] AAHIVMS [facility] Hematology/Oncology Physician [place]  (Office):       [phone] (Work cell):  [phone] (Fax):           [phone]  This document serves as a record of services personally performed by Sullivan Lone, MD. It was created on his behalf by Baldwin [place], a trained medical scribe. The creation of this record is based on the scribe's personal observations  and the provider's statements to them.   .I have reviewed the above documentation for accuracy and completeness, and I agree with the above. Brunetta Genera MD [place]

## 2017-09-09 ENCOUNTER — Ambulatory Visit: Payer: Medicare Other | Admitting: Hematology

## 2017-09-09 ENCOUNTER — Other Ambulatory Visit: Payer: Medicare Other

## 2017-09-09 ENCOUNTER — Ambulatory Visit: Payer: Medicare Other

## 2017-09-12 ENCOUNTER — Inpatient Hospital Stay (HOSPITAL_BASED_OUTPATIENT_CLINIC_OR_DEPARTMENT_OTHER): Payer: Medicare Other | Admitting: Hematology

## 2017-09-12 ENCOUNTER — Inpatient Hospital Stay: Payer: Medicare Other | Attending: Hematology

## 2017-09-12 ENCOUNTER — Inpatient Hospital Stay: Payer: Medicare Other

## 2017-09-12 VITALS — BP 134/76 | HR 60 | Temp 98.2°F | Resp 17 | Ht 65.8 in | Wt 167.2 lb

## 2017-09-12 VITALS — BP 115/60 | HR 56 | Temp 98.0°F | Resp 18

## 2017-09-12 DIAGNOSIS — G620 Drug-induced polyneuropathy: Secondary | ICD-10-CM | POA: Diagnosis not present

## 2017-09-12 DIAGNOSIS — C8298 Follicular lymphoma, unspecified, lymph nodes of multiple sites: Secondary | ICD-10-CM

## 2017-09-12 DIAGNOSIS — Z79899 Other long term (current) drug therapy: Secondary | ICD-10-CM | POA: Insufficient documentation

## 2017-09-12 DIAGNOSIS — C8330 Diffuse large B-cell lymphoma, unspecified site: Secondary | ICD-10-CM

## 2017-09-12 DIAGNOSIS — Z5112 Encounter for antineoplastic immunotherapy: Secondary | ICD-10-CM | POA: Insufficient documentation

## 2017-09-12 LAB — CBC WITH DIFFERENTIAL/PLATELET
Basophils Absolute: 0 10*3/uL (ref 0.0–0.1)
Basophils Relative: 0 %
Eosinophils Absolute: 0 10*3/uL (ref 0.0–0.5)
Eosinophils Relative: 0 %
HCT: 38.4 % (ref 34.8–46.6)
Hemoglobin: 13 g/dL (ref 11.6–15.9)
Lymphocytes Relative: 4 %
Lymphs Abs: 0.3 10*3/uL — ABNORMAL LOW (ref 0.9–3.3)
MCH: 31.6 pg (ref 25.1–34.0)
MCHC: 33.9 g/dL (ref 31.5–36.0)
MCV: 93.4 fL (ref 79.5–101.0)
Monocytes Absolute: 0.4 10*3/uL (ref 0.1–0.9)
Monocytes Relative: 4 %
Neutro Abs: 8.2 10*3/uL — ABNORMAL HIGH (ref 1.5–6.5)
Neutrophils Relative %: 92 %
Platelets: 286 10*3/uL (ref 145–400)
RBC: 4.11 MIL/uL (ref 3.70–5.45)
RDW: 13.3 % (ref 11.2–14.5)
WBC: 8.9 10*3/uL (ref 3.9–10.3)

## 2017-09-12 LAB — CMP (CANCER CENTER ONLY)
ALT: 24 U/L (ref 0–55)
AST: 15 U/L (ref 5–34)
Albumin: 4.4 g/dL (ref 3.5–5.0)
Alkaline Phosphatase: 116 U/L (ref 40–150)
Anion gap: 9 (ref 3–11)
BUN: 18 mg/dL (ref 7–26)
CO2: 22 mmol/L (ref 22–29)
Calcium: 9.9 mg/dL (ref 8.4–10.4)
Chloride: 105 mmol/L (ref 98–109)
Creatinine: 0.74 mg/dL (ref 0.60–1.10)
GFR, Est AFR Am: >60 mL/min (ref >60)
GFR, Estimated: >60 mL/min (ref >60)
Glucose, Bld: 137 mg/dL (ref 70–140)
Potassium: 4.2 mmol/L (ref 3.5–5.1)
Sodium: 136 mmol/L (ref 136–145)
Total Bilirubin: 0.5 mg/dL (ref 0.2–1.2)
Total Protein: 6.9 g/dL (ref 6.4–8.3)

## 2017-09-12 LAB — RETICULOCYTES
RBC.: 4.11 MIL/uL (ref 3.70–5.45)
Retic Count, Absolute: 32.9 10*3/uL — ABNORMAL LOW (ref 33.7–90.7)
Retic Ct Pct: 0.8 % (ref 0.7–2.1)

## 2017-09-12 LAB — LACTATE DEHYDROGENASE: LDH: 187 U/L (ref 125–245)

## 2017-09-12 MED ORDER — SODIUM CHLORIDE 0.9 % IV SOLN
Freq: Once | INTRAVENOUS | Status: AC
  Administered 2017-09-12: 10:00:00 via INTRAVENOUS

## 2017-09-12 MED ORDER — ACETAMINOPHEN 325 MG PO TABS
650.0000 mg | ORAL_TABLET | Freq: Once | ORAL | Status: AC
  Administered 2017-09-12: 650 mg via ORAL

## 2017-09-12 MED ORDER — ACETAMINOPHEN 325 MG PO TABS
ORAL_TABLET | ORAL | Status: AC
  Filled 2017-09-12: qty 2

## 2017-09-12 MED ORDER — DEXAMETHASONE SODIUM PHOSPHATE 100 MG/10ML IJ SOLN
12.0000 mg | Freq: Once | INTRAMUSCULAR | Status: AC
  Administered 2017-09-12: 12 mg via INTRAVENOUS
  Filled 2017-09-12: qty 1.2

## 2017-09-12 MED ORDER — DIPHENHYDRAMINE HCL 25 MG PO CAPS
50.0000 mg | ORAL_CAPSULE | Freq: Once | ORAL | Status: AC
  Administered 2017-09-12: 50 mg via ORAL

## 2017-09-12 MED ORDER — DIPHENHYDRAMINE HCL 25 MG PO CAPS
ORAL_CAPSULE | ORAL | Status: AC
  Filled 2017-09-12: qty 2

## 2017-09-12 MED ORDER — RITUXIMAB CHEMO INJECTION 500 MG/50ML
375.0000 mg/m2 | Freq: Once | INTRAVENOUS | Status: AC
  Administered 2017-09-12: 700 mg via INTRAVENOUS
  Filled 2017-09-12: qty 70

## 2017-09-12 NOTE — Patient Instructions (Signed)
[facility] Discharge Instructions for Patients Receiving Chemotherapy  Today you received the following chemotherapy agents rituxan.   To help prevent nausea and vomiting after your treatment, we encourage you to take your nausea medication as directed.     If you develop nausea and vomiting that is not controlled by your nausea medication, call the clinic.   BELOW ARE SYMPTOMS THAT SHOULD BE REPORTED IMMEDIATELY:  *FEVER GREATER THAN 100.5 F  *CHILLS WITH OR WITHOUT FEVER  NAUSEA AND VOMITING THAT IS NOT CONTROLLED WITH YOUR NAUSEA MEDICATION  *UNUSUAL SHORTNESS OF BREATH  *UNUSUAL BRUISING OR BLEEDING  TENDERNESS IN MOUTH AND THROAT WITH OR WITHOUT PRESENCE OF ULCERS  *URINARY PROBLEMS  *BOWEL PROBLEMS  UNUSUAL RASH Items with * indicate a potential emergency and should be followed up as soon as possible.  Feel free to call the clinic you have any questions or concerns. The clinic phone number is [phone].  

## 2017-10-13 ENCOUNTER — Ambulatory Visit: Payer: Medicare Other | Admitting: Family Medicine

## 2017-11-04 NOTE — Progress Notes (Signed)
[name]     HEMATOLOGY/ONCOLOGY CLINIC NOTE  Date of Service:  11/07/17   Patient Care Team: [name], MD as PCP - General (Family Medicine) [name] M.D. (ENT)  CHIEF COMPLAINTS/PURPOSE OF CONSULTATION:   F/u for follicular lymphoma/DLBCL  HISTORY OF PRESENTING ILLNESS:   Please see previous note for details of initial presentation.  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 She has completed her 6 cycles of R CHOP and is currently in complete remission. She presents today for her seventh round of maintenance Rituxan. The patient's last visit with [place] was on 09/12/17. The pt reports that she is doing well overall.   The pt reports that she has been staying active, riding her bicycle frequently. She notes that her hands no longer have any neuropathy and the neuropathy in her feet continues to diminish.   Lab results today (11/07/17) of CBC w/diff, CMP is as follows: all values are WNL except for WBC at 11.6k, ANC at 10.5k, Lymphs abs at 500, Glucose at 128, Alk Phos at 137. LDH 7/819 is pending  On review of systems, pt reports good energy levels, diminishing neuropathy, and denies fevers, chills, night sweats, and any other symptoms.   MEDICAL HISTORY:  Past Medical History:  Diagnosis Date  . Anemia    h/o iron deficiency secondary to heavy menses  . Arthritis    left hand index finger  . Diffuse large B cell lymphoma ([place])   . Grief reaction 01/27/2016  . H/O measles    as a child  . History of chicken pox    as a child  . History of PCOS   . Hyperlipidemia, mixed 01/27/2016  . Lymphadenopathy   . 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 Preventative health care 01/27/2016  . Vitamin D deficiency   . Wears glasses     SURGICAL HISTORY: Past Surgical History:  Procedure Laterality Date  . COLONOSCOPY    . IR GENERIC HISTORICAL  05/21/2016   IR FLUORO GUIDE PORT INSERTION RIGHT 05/21/2016 Arne Cleveland, MD [facility]  . IR GENERIC HISTORICAL  05/21/2016   IR US GUIDE VASC ACCESS RIGHT 05/21/2016 Arne Cleveland, MD [facility]  . IR REMOVAL TUN ACCESS W/ PORT W/O FL MOD SED  10/27/2016  . SKIN BIOPSY Left    face  . THYROGLOSSAL DUCT CYST N/A 04/16/2016   Procedure: THYROGLOSSAL DUCT CYST excision;  Surgeon: [name], MD;  Location: [facility];  Service: ENT;  Laterality: N/A;    SOCIAL HISTORY: Social History   Socioeconomic History  . Marital status: Widowed    Spouse name: Not on file  . Number of children: Not on file  . Years of education: Not on file  . Highest education level: Not on file  Occupational History  . Occupation: Psychotherapist  Social Needs  . Financial resource strain: Not on file  . Food insecurity:    Worry: Not on file    Inability: Not on file  . Transportation needs:    Medical: Not on file    Non-medical: Not on file  Tobacco Use  . Smoking status: Never Smoker  . Smokeless tobacco: Never Used  Substance and Sexual Activity  . Alcohol use: Yes    Alcohol/week: 1.8 - 3.6 oz    Types: 3 - 6 Standard drinks or equivalent per week    Comment: 5 glasses of wine a week.  . Drug use: No  .  Sexual activity: Never    Partners: Male    Birth control/protection: None  Lifestyle  . Physical activity:    Days per week: Not on file    Minutes per session: Not on file  . Stress: Not on file  Relationships  . Social connections:    Talks on phone: Not on file    Gets together: Not on file    Attends religious service: Not on file    Active member of club or organization: Not on file    Attends meetings of clubs or organizations: Not on file    Relationship status: Not on file  . Intimate partner violence:    Fear of current or ex partner: Not on file    Emotionally abused: Not on file    Physically abused: Not on file    Forced sexual activity: Not on file  Other Topics Concern  . Not on file  Social History Narrative   Married.  Education: [employer]. Exercise: walking, yoga, and bicycling daily for 1-2 hours. Lives alone, works as Management consultant    FAMILY HISTORY: Family History  Problem Relation Age of Onset  . Arthritis Mother        rheumatoid  . Heart disease Father        CHF  . COPD Sister        Emphysema, h/o cigarettes  . Other Sister        pituatary tumor  . Arthritis Sister   . Obesity Sister   . Cancer Paternal Grandfather        cancer  . Stroke Maternal Aunt     ALLERGIES:  is allergic to erythromycin and other.  MEDICATIONS:  Current Outpatient Medications  Medication Sig Dispense Refill  . Calcium-Magnesium-Vitamin D (CALCIUM MAGNESIUM PO) Take 1 tablet by mouth daily.    . Cholecalciferol (VITAMIN D) 2000 units CAPS Take 2,000 Units by mouth daily.    [name] dexamethasone (DECADRON) 4 MG tablet TAKE 3 TABLETS BY MOUTH TWICE DAILY, TAKE 1 DOSE IN THE MORNING AND 1 DOSE IN THE EVENING THE DAY PRIOR TO Rituxan 12 tablet 6  . HYDROcodone-homatropine (HYCODAN) 5-1.5 MG/5ML syrup Take 5 mLs by mouth every 6 (six) hours as needed for cough. 120 mL 0  . magic mouthwash w/lidocaine SOLN Take 5 mLs by mouth 4 (four) times daily as needed for mouth pain. 240 mL 0  . Omega-3 Fatty Acids (OMEGA-3 PO) Take 2 capsules by mouth daily. Omega 3 1280 mg     No current facility-administered medications for this visit.     REVIEW OF SYSTEMS:   A 10+ POINT REVIEW OF SYSTEMS WAS OBTAINED including neurology, dermatology, psychiatry, cardiac, respiratory, lymph, extremities, GI, GU, Musculoskeletal, constitutional, breasts, reproductive, HEENT.  All pertinent positives are noted in the HPI.  All others are negative.    PHYSICAL EXAMINATION:  ECOG PERFORMANCE STATUS: 0 - Asymptomatic  Vitals:   11/07/17 0924  BP: 133/75  Pulse: (!) 57  Resp: 17  Temp: 98.6 F (37 C)  SpO2: 99%   Filed Weights   11/07/17 0924  Weight: 170 lb 4.8 oz (77.2 kg)   .Body mass index is 27.65 kg/m.  GENERAL:alert, in no  acute distress and comfortable SKIN: no acute rashes, no significant lesions EYES: conjunctiva are pink and non-injected, sclera anicteric OROPHARYNX: MMM, no exudates, no oropharyngeal erythema or ulceration NECK: supple, no JVD LYMPH:  no palpable lymphadenopathy in the cervical, axillary or inguinal regions LUNGS: clear to auscultation b/l with  normal respiratory effort HEART: regular rate & rhythm ABDOMEN:  normoactive bowel sounds , non tender, not distended. No palpable hepatosplenomegaly.  Extremity: no pedal edema PSYCH: alert & oriented x 3 with fluent speech NEURO: no focal motor/sensory deficits   LABORATORY DATA:  I have reviewed the data as listed  . CBC Latest Ref Rng & Units 11/07/2017 09/12/2017 07/11/2017  WBC 3.9 - 10.3 K/uL 11.6(H) 8.9 10.3  Hemoglobin 11.6 - 15.9 g/dL 13.0 13.0 13.2  Hematocrit 34.8 - 46.6 % 37.6 38.4 38.8  Platelets 145 - 400 K/uL 253 286 291   CBC    Component Value Date/Time   WBC 11.6 (H) 11/07/2017 0854   RBC 4.12 11/07/2017 0854   HGB 13.0 11/07/2017 0854   HGB 13.2 07/11/2017 0757   HGB 13.3 03/07/2017 0842   HCT 37.6 11/07/2017 0854   HCT 39.1 03/07/2017 0842   PLT 253 11/07/2017 0854   PLT 291 07/11/2017 0757   PLT 283 03/07/2017 0842   MCV 91.3 11/07/2017 0854   MCV 91.4 03/07/2017 0842   MCH 31.6 11/07/2017 0854   MCHC 34.6 11/07/2017 0854   RDW 12.8 11/07/2017 0854   RDW 13.3 03/07/2017 0842   LYMPHSABS 0.5 (L) 11/07/2017 0854   LYMPHSABS 0.4 (L) 03/07/2017 0842   MONOABS 0.6 11/07/2017 0854   MONOABS 0.7 03/07/2017 0842   EOSABS 0.0 11/07/2017 0854   EOSABS 0.0 03/07/2017 0842   BASOSABS 0.0 11/07/2017 0854   BASOSABS 0.0 03/07/2017 0842    . CMP Latest Ref Rng & Units 11/07/2017 09/12/2017 07/11/2017  Glucose 70 - 99 mg/dL 128(H) 137 152(H)  BUN 8 - 23 mg/dL 18 18 13   Creatinine 0.44 - 1.00 mg/dL 0.81 0.74 0.74  Sodium 135 - 145 mmol/L 138 136 138  Potassium 3.5 - 5.1 mmol/L 4.1 4.2 3.8  Chloride 98 - 111 mmol/L 104  105 105  CO2 22 - 32 mmol/L 24 22 21(L)  Calcium 8.9 - 10.3 mg/dL 10.0 9.9 10.3  Total Protein 6.5 - 8.1 g/dL 7.0 6.9 7.1  Total Bilirubin 0.3 - 1.2 mg/dL 0.5 0.5 0.6  Alkaline Phos 38 - 126 U/L 137(H) 116 110  AST 15 - 41 U/L 19 15 16   ALT 0 - 44 U/L 21 24 22    Component     Latest Ref Rng & Units 05/07/2016  Hepatitis B Surface Ag     Negative Negative  Hep B Core Ab, Tot     Negative Negative  Hep C Virus Ab     0.0 - 0.9 s/co ratio <0.1   . 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  Initial PET/CT showed Scattered small mildly hypermetabolic lymph nodes within the neck, left hilum, left axilla and left groin, potentially related the patient's lymphoma. Focal hypermetabolic activity within the left sternal manubrium and left iliac bone, also potentially related to the patient's lymphoma. No evidence of solid visceral organ involvement.  CT guided bone marrow biopsy done -- no evidence of lymphoma involving Bone marrow. Normal LDH. No constitutional symptoms. Patient has no significant medical comorbidities at baseline. ECHO with nl EF as expected.  Patient has completed 6 cycles of R CHOP PET/CT s/p 6 cycles of R-CHOP shows complete metabolic  response CT chest/abd/pelvis 07/07/2017 - showed no radipgraphic evidence of lymphoma progression at this time.  Lab Results  Component Value Date   LDH 201 (H) 11/07/2017   2) Rituxan hypersensitivity (mild grade 1 hives on the back  - resolved with solumedrol and antihistamines. No issues with dexamethasone pretreatment -Patient will continue dexamethasone premedication to reduce risk of Rituxan hypersensitivity.  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 She is up to date on her vaccinations.  -Will do repeat CT scans every 6 months, earlier if concerning symptoms present.  -Discussed pt labwork today, 11/07/17; ANC at 10.5k, HGB normal, PLT normal. Lymphs at 500.  -The pt has no prohibitive toxicities from continuing maintenance Rtiuxan q 60 days with dexamethasone pre-medication at this time.  -Will see pt back in 2 months    Continue Maintenance Rituxan q60 days- plz schedule next 3 treatments RTC with Dr Irene Limbo with labs [phone] with each maintenance Rituxan   . Orders Placed This Encounter  Procedures  . CBC with Differential/Platelet    Standing Status:   Standing    Number of Occurrences:   6    Standing Expiration Date:   11/08/2018  . CMP ([place] only)    Standing Status:   Standing    Number of Occurrences:   6    Standing Expiration Date:   11/08/2018  . Lactate dehydrogenase    Standing Status:   Standing    Number of Occurrences:   6    Standing Expiration Date:   11/08/2018    All of the patients questions were answered with apparent satisfaction. The patient knows to call the clinic with any problems, questions or concerns.  . The total time spent in the appointment was 25 minutes and more than 50% was on counseling and direct patient cares.      Sullivan Lone MD [place] AAHIVMS [facility] Hematology/Oncology Physician [facility]  (Office):       [phone] (Work cell):  [phone] (Fax):           [phone]  I, Baldwin Jamaica, am acting as a scribe for Dr Irene Limbo.   .I have reviewed the above documentation for accuracy and completeness, and I agree with the above. Brunetta Genera MD

## 2017-11-07 ENCOUNTER — Inpatient Hospital Stay: Payer: Medicare Other

## 2017-11-07 ENCOUNTER — Inpatient Hospital Stay: Payer: Medicare Other | Attending: Hematology | Admitting: Hematology

## 2017-11-07 ENCOUNTER — Encounter: Payer: Self-pay | Admitting: Hematology

## 2017-11-07 VITALS — BP 133/75 | HR 57 | Temp 98.6°F | Resp 17 | Ht 65.8 in | Wt 170.3 lb

## 2017-11-07 VITALS — BP 128/58 | HR 62 | Temp 98.5°F | Resp 16

## 2017-11-07 DIAGNOSIS — C8298 Follicular lymphoma, unspecified, lymph nodes of multiple sites: Secondary | ICD-10-CM

## 2017-11-07 DIAGNOSIS — G629 Polyneuropathy, unspecified: Secondary | ICD-10-CM | POA: Diagnosis not present

## 2017-11-07 DIAGNOSIS — Z5112 Encounter for antineoplastic immunotherapy: Secondary | ICD-10-CM | POA: Insufficient documentation

## 2017-11-07 DIAGNOSIS — C8330 Diffuse large B-cell lymphoma, unspecified site: Secondary | ICD-10-CM | POA: Diagnosis not present

## 2017-11-07 DIAGNOSIS — Z79899 Other long term (current) drug therapy: Secondary | ICD-10-CM | POA: Insufficient documentation

## 2017-11-07 DIAGNOSIS — C8338 Diffuse large B-cell lymphoma, lymph nodes of multiple sites: Secondary | ICD-10-CM

## 2017-11-07 LAB — CBC WITH DIFFERENTIAL/PLATELET
Basophils Absolute: 0 10*3/uL (ref 0.0–0.1)
Basophils Relative: 0 %
Eosinophils Absolute: 0 10*3/uL (ref 0.0–0.5)
Eosinophils Relative: 0 %
HCT: 37.6 % (ref 34.8–46.6)
Hemoglobin: 13 g/dL (ref 11.6–15.9)
Lymphocytes Relative: 4 %
Lymphs Abs: 0.5 10*3/uL — ABNORMAL LOW (ref 0.9–3.3)
MCH: 31.6 pg (ref 25.1–34.0)
MCHC: 34.6 g/dL (ref 31.5–36.0)
MCV: 91.3 fL (ref 79.5–101.0)
Monocytes Absolute: 0.6 10*3/uL (ref 0.1–0.9)
Monocytes Relative: 5 %
Neutro Abs: 10.5 10*3/uL — ABNORMAL HIGH (ref 1.5–6.5)
Neutrophils Relative %: 91 %
Platelets: 253 10*3/uL (ref 145–400)
RBC: 4.12 MIL/uL (ref 3.70–5.45)
RDW: 12.8 % (ref 11.2–14.5)
WBC: 11.6 10*3/uL — ABNORMAL HIGH (ref 3.9–10.3)

## 2017-11-07 LAB — CMP (CANCER CENTER ONLY)
ALT: 21 U/L (ref 0–44)
AST: 19 U/L (ref 15–41)
Albumin: 4.4 g/dL (ref 3.5–5.0)
Alkaline Phosphatase: 137 U/L — ABNORMAL HIGH (ref 38–126)
Anion gap: 10 (ref 5–15)
BUN: 18 mg/dL (ref 8–23)
CO2: 24 mmol/L (ref 22–32)
Calcium: 10 mg/dL (ref 8.9–10.3)
Chloride: 104 mmol/L (ref 98–111)
Creatinine: 0.81 mg/dL (ref 0.44–1.00)
GFR, Est AFR Am: >60 mL/min (ref >60)
GFR, Estimated: >60 mL/min (ref >60)
Glucose, Bld: 128 mg/dL — ABNORMAL HIGH (ref 70–99)
Potassium: 4.1 mmol/L (ref 3.5–5.1)
Sodium: 138 mmol/L (ref 135–145)
Total Bilirubin: 0.5 mg/dL (ref 0.3–1.2)
Total Protein: 7 g/dL (ref 6.5–8.1)

## 2017-11-07 LAB — LACTATE DEHYDROGENASE: LDH: 201 U/L — ABNORMAL HIGH (ref 98–192)

## 2017-11-07 MED ORDER — SODIUM CHLORIDE 0.9 % IV SOLN
375.0000 mg/m2 | Freq: Once | INTRAVENOUS | Status: AC
  Administered 2017-11-07: 700 mg via INTRAVENOUS
  Filled 2017-11-07: qty 50

## 2017-11-07 MED ORDER — ACETAMINOPHEN 325 MG PO TABS
650.0000 mg | ORAL_TABLET | Freq: Once | ORAL | Status: AC
  Administered 2017-11-07: 650 mg via ORAL

## 2017-11-07 MED ORDER — DEXAMETHASONE SODIUM PHOSPHATE 100 MG/10ML IJ SOLN
12.0000 mg | Freq: Once | INTRAMUSCULAR | Status: AC
  Administered 2017-11-07: 12 mg via INTRAVENOUS
  Filled 2017-11-07: qty 1.2

## 2017-11-07 MED ORDER — DIPHENHYDRAMINE HCL 25 MG PO CAPS
ORAL_CAPSULE | ORAL | Status: AC
  Filled 2017-11-07: qty 2

## 2017-11-07 MED ORDER — SODIUM CHLORIDE 0.9 % IV SOLN
Freq: Once | INTRAVENOUS | Status: AC
  Administered 2017-11-07: 11:00:00 via INTRAVENOUS

## 2017-11-07 MED ORDER — DIPHENHYDRAMINE HCL 25 MG PO CAPS
50.0000 mg | ORAL_CAPSULE | Freq: Once | ORAL | Status: AC
  Administered 2017-11-07: 50 mg via ORAL

## 2017-11-07 MED ORDER — ACETAMINOPHEN 325 MG PO TABS
ORAL_TABLET | ORAL | Status: AC
  Filled 2017-11-07: qty 2

## 2017-11-07 NOTE — Patient Instructions (Signed)
[facility] Discharge Instructions for Patients Receiving Chemotherapy  Today you received the following chemotherapy agents Rituxan  To help prevent nausea and vomiting after your treatment, we encourage you to take your nausea medication as directed.   If you develop nausea and vomiting that is not controlled by your nausea medication, call the clinic.   BELOW ARE SYMPTOMS THAT SHOULD BE REPORTED IMMEDIATELY:  *FEVER GREATER THAN 100.5 F  *CHILLS WITH OR WITHOUT FEVER  NAUSEA AND VOMITING THAT IS NOT CONTROLLED WITH YOUR NAUSEA MEDICATION  *UNUSUAL SHORTNESS OF BREATH  *UNUSUAL BRUISING OR BLEEDING  TENDERNESS IN MOUTH AND THROAT WITH OR WITHOUT PRESENCE OF ULCERS  *URINARY PROBLEMS  *BOWEL PROBLEMS  UNUSUAL RASH Items with * indicate a potential emergency and should be followed up as soon as possible.  Feel free to call the clinic you have any questions or concerns. The clinic phone number is [phone].    

## 2017-11-11 ENCOUNTER — Ambulatory Visit (HOSPITAL_BASED_OUTPATIENT_CLINIC_OR_DEPARTMENT_OTHER)
Admission: RE | Admit: 2017-11-11 | Discharge: 2017-11-11 | Disposition: A | Discharge status: Home / Self Care | Payer: Medicare Other | Source: Ambulatory Visit | Attending: Medical | Admitting: Medical

## 2017-11-11 ENCOUNTER — Encounter: Payer: Self-pay | Admitting: Medical

## 2017-11-11 ENCOUNTER — Ambulatory Visit (INDEPENDENT_AMBULATORY_CARE_PROVIDER_SITE_OTHER): Payer: Medicare Other | Admitting: Medical

## 2017-11-11 VITALS — BP 147/83 | HR 63 | Temp 98.6°F | Resp 16 | Ht 65.0 in | Wt 166.2 lb

## 2017-11-11 DIAGNOSIS — M79601 Pain in right arm: Secondary | ICD-10-CM | POA: Diagnosis not present

## 2017-11-11 DIAGNOSIS — M25531 Pain in right wrist: Secondary | ICD-10-CM | POA: Insufficient documentation

## 2017-11-11 DIAGNOSIS — S46811A Strain of other muscles, fascia and tendons at shoulder and upper arm level, right arm, initial encounter: Secondary | ICD-10-CM

## 2017-11-11 NOTE — Patient Instructions (Addendum)
By exam and history you do appear to have distal forearm muscle strain and tenosynovitis of the wrist.  I do think it would be beneficial to get x-ray and recommend that you use wrist cock-up splint daily.  For pain inflammation recommend using over-the-counter ibuprofen 400 to 800 mg every 8 hours.   Regarding your milder right bicep region pain, I do think this is also a strain injury.  If bicep pain worsens or you notice any swelling of the upper extremity then would recommend getting ultrasound.  This would evaluate deep veins.  This scenario would be very unlikely but brought it up for caution sake.  Your right trapezius is tender to palpation and this does go along with your mechanism of injury.  I do think ibuprofen would help as well.  Note you do report some occasional radiating pain.  But no associated neck pain.  If radiating pain worsens or if any mid spinal pain then would need to get cervical spine x-ray.  Use cock-up splint and ibuprofen as directed.  If you could remove splint Tuesday afternoon briefly and assess if you have any pain with range of motion.  If pain has not subsided significantly then I could refer you to sports medicine to expedite your healing.  Follow-up in 7 to 10 days or as needed.

## 2017-11-11 NOTE — Progress Notes (Signed)
Subjective:    [name], female    DOB: [birth year], 72 y.o.   MRN: [id number]  HPI  Pt in states on Wednesday. She was in a rush to cut grass and her lawn more would not crank. She had to pull cord various times. Also broke branches cleaning up yard. Then later in day when driving noted throbbing pain. Pt is right handed.   At one point on first day had pain shooting up toward upper arm and neck. But now pain mostly just in wrist. When she rest she only has wrist/foream pain. But when uses her wrist some pain that radiates up toward bicep.     Review of Systems  Constitutional: Negative for diaphoresis, fatigue and fever.  Respiratory: Negative for cough, chest tightness, shortness of breath and wheezing.   Cardiovascular: Negative for chest pain and palpitations.  Musculoskeletal:       See hpi and exam.  Skin: Negative for rash.  Neurological: Negative for dizziness and headaches.  Hematological: Negative for adenopathy. Does not bruise/bleed easily.   Past Medical History:  Diagnosis Date  . Anemia    h/o iron deficiency secondary to heavy menses  . Arthritis    left hand index finger  . Diffuse large B cell lymphoma ([place])   . Grief reaction 01/27/2016  . H/O measles    as a child  . History of chicken pox    as a child  . History of PCOS   . Hyperlipidemia, mixed 01/27/2016  . Lymphadenopathy   . 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 Preventative health care 01/27/2016  . Vitamin D deficiency   . Wears glasses      Social History   Socioeconomic History  . Marital status: Widowed    Spouse name: Not on file  . Number of children: Not on file  . Years of education: Not on file  . Highest education level: Not on file  Occupational History  . Occupation: Psychotherapist  Social Needs  . Financial resource strain: Not on file  . Food insecurity:    Worry: Not on file    Inability: Not on file  . Transportation needs:    Medical: Not on file      Non-medical: Not on file  Tobacco Use  . Smoking status: Never Smoker  . Smokeless tobacco: Never Used  Substance and Sexual Activity  . Alcohol use: Yes    Alcohol/week: 1.8 - 3.6 oz    Types: 3 - 6 Standard drinks or equivalent per week    Comment: 5 glasses of wine a week.  . Drug use: No  . Sexual activity: Never    Partners: Male    Birth control/protection: None  Lifestyle  . Physical activity:    Days per week: Not on file    Minutes per session: Not on file  . Stress: Not on file  Relationships  . Social connections:    Talks on phone: Not on file    Gets together: Not on file    Attends religious service: Not on file    Active member of club or organization: Not on file    Attends meetings of clubs or organizations: Not on file    Relationship status: Not on file  . Intimate partner violence:    Fear of current or ex partner: Not on file    Emotionally abused: Not on file    Physically abused: Not on file    Forced sexual activity:  Not on file  Other Topics Concern  . Not on file  Social History Narrative   Married. Education: [employer]. Exercise: walking, yoga, and bicycling daily for 1-2 hours. Lives alone, works as Management consultant    Past Surgical History:  Procedure Laterality Date  . COLONOSCOPY    . IR GENERIC HISTORICAL  05/21/2016   IR FLUORO GUIDE PORT INSERTION RIGHT 05/21/2016 Arne Cleveland, MD [facility]  . IR GENERIC HISTORICAL  05/21/2016   IR US GUIDE VASC ACCESS RIGHT 05/21/2016 Arne Cleveland, MD [facility]  . IR REMOVAL TUN ACCESS W/ PORT W/O FL MOD SED  10/27/2016  . SKIN BIOPSY Left    face  . THYROGLOSSAL DUCT CYST N/A 04/16/2016   Procedure: THYROGLOSSAL DUCT CYST excision;  Surgeon: [name], MD;  Location: [facility] OR;  Service: ENT;  Laterality: N/A;    Family History  Problem Relation Age of Onset  . Arthritis Mother        rheumatoid  . Heart disease Father        CHF  . COPD Sister        Emphysema, h/o cigarettes  .  Other Sister        pituatary tumor  . Arthritis Sister   . Obesity Sister   . Cancer Paternal Grandfather        cancer  . Stroke Maternal Aunt     Allergies  Allergen Reactions  . Erythromycin Other (See Comments)    Patient states that she passed out while using this medication ? SYNCOPE ?  [name] Other Anaphylaxis    # # # CATS # # #    Current Outpatient Medications on File Prior to Visit  Medication Sig Dispense Refill  . Calcium-Magnesium-Vitamin D (CALCIUM MAGNESIUM PO) Take 1 tablet by mouth daily.    . Cholecalciferol (VITAMIN D) 2000 units CAPS Take 2,000 Units by mouth daily.    [name] dexamethasone (DECADRON) 4 MG tablet TAKE 3 TABLETS BY MOUTH TWICE DAILY, TAKE 1 DOSE IN THE MORNING AND 1 DOSE IN THE EVENING THE DAY PRIOR TO Rituxan 12 tablet 6  . Omega-3 Fatty Acids (OMEGA-3 PO) Take 2 capsules by mouth daily. Omega 3 1280 mg     No current facility-administered medications on file prior to visit.     BP (!) 147/83   Pulse 63   Temp 98.6 F (37 C) (Oral)   Resp 16   Ht 5\' 5"  (1.651 m)   Wt 166 lb 3.2 oz (75.4 kg)   SpO2 99%   BMI 27.66 kg/m       Objective:   Physical Exam  General- No acute distress. Pleasant patient. Neck- Full range of motion, no jvd. No pain mid cervical spine on palpation.(no radiating pain on turning head to right or left. Lungs- Clear, even and unlabored. Heart- regular rate and rhythm. Neurologic- CNII- XII grossly intact. Rt trapezius tenderness to palpation.  Rt upper ext- bicep not swollen. No bruit on auscultation medial upper ext.  Rt elbow and forearm- no tender.  Rt wrist mild pain on pronation, supination, flexion and extenion. Pain on palpation both ventral and dorsal aspect. Rt hand- no pain on palpation.       Assessment & Plan:  By exam and history you do appear to have distal forearm muscle strain and tenosynovitis of the wrist.  I do think it would be beneficial to get x-ray and recommend that you use wrist cock-up  splint daily.  For pain inflammation recommend  using over-the-counter ibuprofen 400 to 800 mg every 8 hours.   Regarding your milder right bicep region pain, I do think this is also a strain injury.  If bicep pain worsens or you notice any swelling of the upper extremity then would recommend getting ultrasound.  This would evaluate deep veins.  This scenario would be very unlikely but brought it up for caution sake.  Your right trapezius is tender to palpation and this does go along with your mechanism of injury.  I do think ibuprofen would help as well.  Note you do report some occasional radiating pain.  But no associated neck pain.  If radiating pain worsens or if any mid spinal pain then would need to get cervical spine x-ray.  Use cock-up splint and ibuprofen as directed.  If you could remove splint Tuesday afternoon briefly and assess if you have any pain with range of motion.  If pain has not subsided significantly then I could refer you to sports medicine to expedite your healing.  Follow-up in 7 to 10 days or as needed.  Mackie Pai, PA-C

## 2017-11-14 ENCOUNTER — Encounter: Payer: Self-pay | Admitting: Family Medicine

## 2017-11-18 ENCOUNTER — Telehealth: Payer: Self-pay | Admitting: Family Medicine

## 2017-11-18 NOTE — Telephone Encounter (Signed)
Called patient to confirm appt for Monday and she was concerned about her height in her chart being wrong. She states she is not 5'5 and someone needs to fix this. I explained that she will need to notify the nurse but I will send a message so this can be looked at on Monday. Thanks.

## 2017-11-18 NOTE — Telephone Encounter (Signed)
Will check height at her visit

## 2017-11-21 ENCOUNTER — Encounter: Payer: Self-pay | Admitting: Family Medicine

## 2017-11-21 ENCOUNTER — Ambulatory Visit (INDEPENDENT_AMBULATORY_CARE_PROVIDER_SITE_OTHER): Payer: Medicare Other | Admitting: Family Medicine

## 2017-11-21 VITALS — BP 120/78 | HR 65 | Temp 97.8°F | Resp 18 | Ht 68.5 in | Wt 166.0 lb

## 2017-11-21 DIAGNOSIS — M25511 Pain in right shoulder: Secondary | ICD-10-CM | POA: Diagnosis not present

## 2017-11-21 DIAGNOSIS — E559 Vitamin D deficiency, unspecified: Secondary | ICD-10-CM | POA: Diagnosis not present

## 2017-11-21 DIAGNOSIS — E782 Mixed hyperlipidemia: Secondary | ICD-10-CM | POA: Diagnosis not present

## 2017-11-21 DIAGNOSIS — C8330 Diffuse large B-cell lymphoma, unspecified site: Secondary | ICD-10-CM | POA: Diagnosis not present

## 2017-11-21 DIAGNOSIS — R739 Hyperglycemia, unspecified: Secondary | ICD-10-CM | POA: Diagnosis not present

## 2017-11-21 DIAGNOSIS — M25531 Pain in right wrist: Secondary | ICD-10-CM

## 2017-11-21 NOTE — Assessment & Plan Note (Signed)
hgba1c acceptable, minimize simple carbs. Increase exercise as tolerated.  

## 2017-11-21 NOTE — Assessment & Plan Note (Addendum)
In remission and following with Oncology. Gets Rotuxin treatments every 60 days and has 5 more treatments. This disrupts her sleep after the treatments for 4-5 days. The Neulasta was worse but now this is completed. Is able to walk and does Yoga at home.

## 2017-11-21 NOTE — Assessment & Plan Note (Addendum)
Vitamin D supplements about 1/3 of days os the month. 2000 IU will check levle

## 2017-11-21 NOTE — Assessment & Plan Note (Signed)
Xray showed a chronic dislocation and pain persists in wrist and shoulder so she is referred to ortho for further consideration

## 2017-11-21 NOTE — Patient Instructions (Signed)
The new shingles shot called Shingrix is 2 shots over 2-6 months, it is not a live virus. If Oncology agrees it can be administered at the pharmacy or a Cholesterol Cholesterol is a white, waxy, fat-like substance that is needed by the human body in small amounts. The liver makes all the cholesterol we need. Cholesterol is carried from the liver by the blood through the blood vessels. Deposits of cholesterol (plaques) may build up on blood vessel (artery) walls. Plaques make the arteries narrower and stiffer. Cholesterol plaques increase the risk for heart attack and stroke. You cannot feel your cholesterol level even if it is very high. The only way to know that it is high is to have a blood test. Once you know your cholesterol levels, you should keep a record of the test results. Work with your health care provider to keep your levels in the desired range. What do the results mean?  Total cholesterol is a rough measure of all the cholesterol in your blood.  LDL (low-density lipoprotein) is the "bad" cholesterol. This is the type that causes plaque to build up on the artery walls. You want this level to be low.  HDL (high-density lipoprotein) is the "good" cholesterol because it cleans the arteries and carries the LDL away. You want this level to be high.  Triglycerides are fat that the body can either burn for energy or store. High levels are closely linked to heart disease. What are the desired levels of cholesterol?  Total cholesterol below 200.  LDL below 100 for people who are at risk, below 70 for people at very high risk.  HDL above 40 is good. A level of 60 or higher is considered to be protective against heart disease.  Triglycerides below 150. How can I lower my cholesterol? Diet Follow your diet program as told by your health care provider.  Choose fish or white meat chicken and [place], roasted or baked. Limit fatty cuts of red meat, fried foods, and processed meats, such as  sausage and lunch meats.  Eat lots of fresh fruits and vegetables.  Choose whole grains, beans, pasta, potatoes, and cereals.  Choose olive oil, corn oil, or canola oil, and use only small amounts.  Avoid butter, mayonnaise, shortening, or palm kernel oils.  Avoid foods with trans fats.  Drink skim or nonfat milk and eat low-fat or nonfat yogurt and cheeses. Avoid whole milk, cream, ice cream, egg yolks, and full-fat cheeses.  Healthier desserts include angel food cake, ginger snaps, animal crackers, hard candy, popsicles, and low-fat or nonfat frozen yogurt. Avoid pastries, cakes, pies, and cookies.  Exercise  Follow your exercise program as told by your health care provider. A regular program: ? Helps to decrease LDL and raise HDL. ? Helps with weight control.  Do things that increase your activity level, such as gardening, walking, and taking the stairs.  Ask your health care provider about ways that you can be more active in your daily life.  Medicine  Take over-the-counter and prescription medicines only as told by your health care provider. ? Medicine may be prescribed by your health care provider to help lower cholesterol and decrease the risk for heart disease. This is usually done if diet and exercise have failed to bring down cholesterol levels. ? If you have several risk factors, you may need medicine even if your levels are normal.  This information is not intended to replace advice given to you by your health care provider. Make sure  you discuss any questions you have with your health care provider. Document Released: 01/12/2001 Document Revised: 11/15/2015 Document Reviewed: 10/18/2015 Elsevier Interactive Patient Education  2018 [employer]. t office for unclear price

## 2017-11-21 NOTE — Progress Notes (Signed)
Subjective:  I acted as a Education administrator for Dr. Charlett Blake. [place], [place]  [name], female    DOB: February 17, 1946, 72 y.o.   MRN: [id number]  No chief complaint on file.   HPI  Patient is in today for a follow up visit and overall she is doing well. She continues to follow closely with oncology for her B cell lymphoma and is tolerating the Rotuxin every 60 days although it does leave her feeling very worn out for a couple of days. No recent febrile illness or hospitalizations. Denies CP/palp/SOB/HA/congestion/fevers/GI or GU c/o. Taking meds as prescribed  Patient Care Team: [name], MD as PCP - General (Family Medicine)   Past Medical History:  Diagnosis Date  . Anemia    h/o iron deficiency secondary to heavy menses  . Arthritis    left hand index finger  . Diffuse large B cell lymphoma ([place])   . Grief reaction 01/27/2016  . H/O measles    as a child  . History of chicken pox    as a child  . History of PCOS   . Hyperlipidemia, mixed 01/27/2016  . Lymphadenopathy   . 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 Preventative health care 01/27/2016  . Vitamin D deficiency   . Wears glasses     Past Surgical History:  Procedure Laterality Date  . COLONOSCOPY    . IR GENERIC HISTORICAL  05/21/2016   IR FLUORO GUIDE PORT INSERTION RIGHT 05/21/2016 Arne Cleveland, MD [facility]  . IR GENERIC HISTORICAL  05/21/2016   IR US GUIDE VASC ACCESS RIGHT 05/21/2016 Arne Cleveland, MD [facility]  . IR REMOVAL TUN ACCESS W/ PORT W/O FL MOD SED  10/27/2016  . SKIN BIOPSY Left    face  . THYROGLOSSAL DUCT CYST N/A 04/16/2016   Procedure: THYROGLOSSAL DUCT CYST excision;  Surgeon: [name], MD;  Location: [facility] OR;  Service: ENT;  Laterality: N/A;    Family History  Problem Relation Age of Onset  . Arthritis Mother        rheumatoid  . Heart disease Father        CHF  . COPD Sister        Emphysema, h/o cigarettes  . Other Sister        pituatary tumor  . Arthritis  Sister   . Obesity Sister   . Cancer Paternal Grandfather        cancer  . Stroke Maternal Aunt     Social History   Socioeconomic History  . Marital status: Widowed    Spouse name: Not on file  . Number of children: Not on file  . Years of education: Not on file  . Highest education level: Not on file  Occupational History  . Occupation: Psychotherapist  Social Needs  . Financial resource strain: Not on file  . Food insecurity:    Worry: Not on file    Inability: Not on file  . Transportation needs:    Medical: Not on file    Non-medical: Not on file  Tobacco Use  . Smoking status: Never Smoker  . Smokeless tobacco: Never Used  Substance and Sexual Activity  . Alcohol use: Yes    Alcohol/week: 1.8 - 3.6 oz    Types: 3 - 6 Standard drinks or equivalent per week    Comment: 5 glasses of wine a week.  . Drug use: No  . Sexual activity: Never    Partners: Male    Birth control/protection:  None  Lifestyle  . Physical activity:    Days per week: Not on file    Minutes per session: Not on file  . Stress: Not on file  Relationships  . Social connections:    Talks on phone: Not on file    Gets together: Not on file    Attends religious service: Not on file    Active member of club or organization: Not on file    Attends meetings of clubs or organizations: Not on file    Relationship status: Not on file  . Intimate partner violence:    Fear of current or ex partner: Not on file    Emotionally abused: Not on file    Physically abused: Not on file    Forced sexual activity: Not on file  Other Topics Concern  . Not on file  Social History Narrative   Married. Education: [employer]. Exercise: walking, yoga, and bicycling daily for 1-2 hours. Lives alone, works as psychotherapist    Outpatient Medications Prior to Visit  Medication Sig Dispense Refill  . Calcium-Magnesium-Vitamin D (CALCIUM MAGNESIUM PO) Take 1 tablet by mouth daily.    . Cholecalciferol (VITAMIN D)  2000 units CAPS Take 2,000 Units by mouth daily.    [name] dexamethasone (DECADRON) 4 MG tablet TAKE 3 TABLETS BY MOUTH TWICE DAILY, TAKE 1 DOSE IN THE MORNING AND 1 DOSE IN THE EVENING THE DAY PRIOR TO Rituxan 12 tablet 6  . Omega-3 Fatty Acids (OMEGA-3 PO) Take 2 capsules by mouth daily. Omega 3 1280 mg     No facility-administered medications prior to visit.     Allergies  Allergen Reactions  . Erythromycin Other (See Comments)    Patient states that she passed out while using this medication ? SYNCOPE ?  [name] Other Anaphylaxis    # # # CATS # # #    Review of Systems  Constitutional: Positive for malaise/fatigue. Negative for fever.  HENT: Negative for congestion.   Eyes: Negative for blurred vision.  Respiratory: Negative for shortness of breath.   Cardiovascular: Negative for chest pain, palpitations and leg swelling.  Gastrointestinal: Negative for abdominal pain, blood in stool and nausea.  Genitourinary: Negative for dysuria and frequency.  Musculoskeletal: Positive for joint pain. Negative for falls.  Skin: Negative for rash.  Neurological: Negative for dizziness, loss of consciousness and headaches.  Endo/Heme/Allergies: Negative for environmental allergies.  Psychiatric/Behavioral: Negative for depression. The patient is not nervous/anxious.        Objective:    Physical Exam  Constitutional: She is oriented to person, place, and time. She appears well-developed and well-nourished. No distress.  HENT:  Head: Normocephalic and atraumatic.  Nose: Nose normal.  Eyes: Right eye exhibits no discharge. Left eye exhibits no discharge.  Neck: Normal range of motion. Neck supple.  Cardiovascular: Normal rate and regular rhythm.  No murmur heard. Pulmonary/Chest: Effort normal and breath sounds normal.  Abdominal: Soft. Bowel sounds are normal. There is no tenderness.  Musculoskeletal: She exhibits no edema.  Neurological: She is alert and oriented to person, place, and time.    Skin: Skin is warm and dry.  Psychiatric: She has a normal mood and affect.  Nursing note and vitals reviewed.   BP 120/78 (BP Location: Left Arm, Patient Position: Sitting, Cuff Size: Normal)   Pulse 65   Temp 97.8 F (36.6 C) (Oral)   Resp 18   Ht 5' 8.5" (1.74 m)   Wt 166 lb (75.3 kg)   SpO2  96%   BMI 24.87 kg/m  Wt Readings from Last 3 Encounters:  11/21/17 166 lb (75.3 kg)  11/11/17 166 lb 3.2 oz (75.4 kg)  11/07/17 170 lb 4.8 oz (77.2 kg)   BP Readings from Last 3 Encounters:  11/21/17 120/78  11/11/17 (!) 147/83  11/07/17 (!) 128/58     Immunization History  Administered Date(s) Administered  . Hepatitis A 07/17/2007, 04/04/2008  . Hepatitis B 07/17/2007, 09/11/2007, 02/15/2008  . Influenza Split 02/24/2013  . Influenza, High Dose Seasonal PF 01/27/2016  . Influenza,inj,Quad PF,6+ Mos 01/16/2014, 02/05/2015, 01/10/2017  . Pneumococcal Conjugate-13 01/16/2014  . Pneumococcal Polysaccharide-23 10/18/2016  . Pneumococcal-Unspecified 02/12/2009  . Td 10/27/2005  . Tdap 02/05/2015  . Typhoid Inactivated 07/17/2007  . Zoster 06/26/2007    Health Maintenance  Topic Date Due  . INFLUENZA VACCINE  12/01/2017  . MAMMOGRAM  12/28/2018  . COLONOSCOPY  03/14/2021  . TETANUS/TDAP  02/04/2025  . DEXA SCAN  Completed  . Hepatitis C Screening  Completed  . PNA vac Low Risk Adult  Completed    Lab Results  Component Value Date   WBC 11.6 (H) 11/07/2017   HGB 13.0 11/07/2017   HCT 37.6 11/07/2017   PLT 253 11/07/2017   GLUCOSE 128 (H) 11/07/2017   CHOL 241 (H) 01/31/2017   TRIG 63.0 01/31/2017   HDL 75.30 01/31/2017   LDLCALC 153 (H) 01/31/2017   ALT 21 11/07/2017   AST 19 11/07/2017   NA 138 11/07/2017   K 4.1 11/07/2017   CL 104 11/07/2017   CREATININE 0.81 11/07/2017   BUN 18 11/07/2017   CO2 24 11/07/2017   TSH 1.52 01/27/2016   INR 1.00 10/27/2016   HGBA1C 5.6 01/31/2017    Lab Results  Component Value Date   TSH 1.52 01/27/2016   Lab Results   Component Value Date   WBC 11.6 (H) 11/07/2017   HGB 13.0 11/07/2017   HCT 37.6 11/07/2017   MCV 91.3 11/07/2017   PLT 253 11/07/2017   Lab Results  Component Value Date   NA 138 11/07/2017   K 4.1 11/07/2017   CHLORIDE 106 03/07/2017   CO2 24 11/07/2017   GLUCOSE 128 (H) 11/07/2017   BUN 18 11/07/2017   CREATININE 0.81 11/07/2017   BILITOT 0.5 11/07/2017   ALKPHOS 137 (H) 11/07/2017   AST 19 11/07/2017   ALT 21 11/07/2017   PROT 7.0 11/07/2017   ALBUMIN 4.4 11/07/2017   CALCIUM 10.0 11/07/2017   ANIONGAP 10 11/07/2017   EGFR >60 03/07/2017   GFR 118.33 01/31/2017   Lab Results  Component Value Date   CHOL 241 (H) 01/31/2017   Lab Results  Component Value Date   HDL 75.30 01/31/2017   Lab Results  Component Value Date   LDLCALC 153 (H) 01/31/2017   Lab Results  Component Value Date   TRIG 63.0 01/31/2017   Lab Results  Component Value Date   CHOLHDL 3 01/31/2017   Lab Results  Component Value Date   HGBA1C 5.6 01/31/2017         Assessment & Plan:   Problem List Items Addressed This Visit    Hyperlipidemia, mixed    Encouraged heart healthy diet, increase exercise, avoid trans fats, consider a krill oil cap daily      Relevant Orders   Lipid panel   TSH   Vitamin D deficiency    Vitamin D supplements about 1/3 of days os the month. 2000 IU will check levle  Relevant Orders   VITAMIN D 25 Hydroxy (Vit-D Deficiency, Fractures)   Diffuse large B cell lymphoma ([place])    In remission and following with Oncology. Gets Rotuxin treatments every 60 days and has 5 more treatments. This disrupts her sleep after the treatments for 4-5 days. The Neulasta was worse but now this is completed. Is able to walk and does Yoga at home.       Hyperglycemia    hgba1c acceptable, minimize simple carbs. Increase exercise as tolerated.       Relevant Orders   Hemoglobin A1c   TSH   Right wrist pain    Xray showed a chronic dislocation and pain persists  in wrist and shoulder so she is referred to ortho for further consideration      Relevant Orders   Ambulatory referral to Orthopedic Surgery   Right shoulder pain - Primary   Relevant Orders   Ambulatory referral to Orthopedic Surgery      I am having Lindsay Harrison "Fraser Din" maintain her Omega-3 Fatty Acids (OMEGA-3 PO), Calcium-Magnesium-Vitamin D (CALCIUM MAGNESIUM PO), Vitamin D, and dexamethasone.  No orders of the defined types were placed in this encounter.   CMA served as Education administrator during this visit. History, Physical and Plan performed by medical provider. Documentation and orders reviewed and attested to.  Penni Homans, MD

## 2017-11-21 NOTE — Progress Notes (Signed)
Subjective:    [name], female    DOB: Nov 07, 1945, 72 y.o.   MRN: [id number]  No chief complaint on file.  HPI Patient is in today for a follow up. Since her last visit she was diagnosed with diffuse large B cell lymphoma, and is undergoing treatment with R CHOP, although the vincristine was discontinued for peripheral neuropathy. She reports that she still has some neuropathy in her toes, but it has improved since discontinuation of the vincristine. Patient also reports some changes in her vision for a couple of days after her treatments. She says that she finds herself having to wear her glasses more frequently. She does not know if it is in one eye, or both eyes.   She was seen last week for right wrist pain which occurred after working in the yard. 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 She also reports some chronic right shoulder pain, that wakes her up at night if she falls asleep on her right side. She believes that it may be injury related to having to help pick her husband up off of the floor when he was ill.  She denies any chest pain, shortness of breath, diarrhea/constipation, or fatigue.   Past Medical History:  Diagnosis Date  . Anemia    h/o iron deficiency secondary to heavy menses  . Arthritis    left hand index finger  . Diffuse large B cell lymphoma ([place])   . Grief reaction 01/27/2016  . H/O measles    as a child  . History of chicken pox    as a child  . History of PCOS   . Hyperlipidemia, mixed 01/27/2016  . Lymphadenopathy   . 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 Preventative health care 01/27/2016  . Vitamin D deficiency   . Wears glasses     Past Surgical History:  Procedure Laterality Date  . COLONOSCOPY    . IR GENERIC HISTORICAL  05/21/2016   IR FLUORO GUIDE PORT INSERTION RIGHT 05/21/2016 Arne Cleveland, MD [facility]  . IR GENERIC HISTORICAL  05/21/2016   IR US GUIDE VASC ACCESS RIGHT 05/21/2016  Arne Cleveland, MD [facility]  . IR REMOVAL TUN ACCESS W/ PORT W/O FL MOD SED  10/27/2016  . SKIN BIOPSY Left    face  . THYROGLOSSAL DUCT CYST N/A 04/16/2016   Procedure: THYROGLOSSAL DUCT CYST excision;  Surgeon: [name], MD;  Location: [facility] OR;  Service: ENT;  Laterality: N/A;    Family History  Problem Relation Age of Onset  . Arthritis Mother        rheumatoid  . Heart disease Father        CHF  . COPD Sister        Emphysema, h/o cigarettes  . Other Sister        pituatary tumor  . Arthritis Sister   . Obesity Sister   . Cancer Paternal Grandfather        cancer  . Stroke Maternal Aunt     Social History   Socioeconomic History  . Marital status: Widowed    Spouse name: Not on file  . Number of children: Not on file  . Years of education: Not on file  . Highest education level: Not on file  Occupational History  . Occupation: Psychotherapist  Social Needs  . Financial resource strain: Not on file  . Food insecurity:    Worry: Not on file    Inability: Not on  file  . Transportation needs:    Medical: Not on file    Non-medical: Not on file  Tobacco Use  . Smoking status: Never Smoker  . Smokeless tobacco: Never Used  Substance and Sexual Activity  . Alcohol use: Yes    Alcohol/week: 1.8 - 3.6 oz    Types: 3 - 6 Standard drinks or equivalent per week    Comment: 5 glasses of wine a week.  . Drug use: No  . Sexual activity: Never    Partners: Male    Birth control/protection: None  Lifestyle  . Physical activity:    Days per week: Not on file    Minutes per session: Not on file  . Stress: Not on file  Relationships  . Social connections:    Talks on phone: Not on file    Gets together: Not on file    Attends religious service: Not on file    Active member of club or organization: Not on file    Attends meetings of clubs or organizations: Not on file    Relationship status: Not on file  . Intimate partner violence:    Fear of current or  ex partner: Not on file    Emotionally abused: Not on file    Physically abused: Not on file    Forced sexual activity: Not on file  Other Topics Concern  . Not on file  Social History Narrative   Married. Education: [employer]. Exercise: walking, yoga, and bicycling daily for 1-2 hours. Lives alone, works as psychotherapist    Outpatient Medications Prior to Visit  Medication Sig Dispense Refill  . Calcium-Magnesium-Vitamin D (CALCIUM MAGNESIUM PO) Take 1 tablet by mouth daily.    . Cholecalciferol (VITAMIN D) 2000 units CAPS Take 2,000 Units by mouth daily.    [name] dexamethasone (DECADRON) 4 MG tablet TAKE 3 TABLETS BY MOUTH TWICE DAILY, TAKE 1 DOSE IN THE MORNING AND 1 DOSE IN THE EVENING THE DAY PRIOR TO Rituxan 12 tablet 6  . Omega-3 Fatty Acids (OMEGA-3 PO) Take 2 capsules by mouth daily. Omega 3 1280 mg     No facility-administered medications prior to visit.     Allergies  Allergen Reactions  . Erythromycin Other (See Comments)    Patient states that she passed out while using this medication ? SYNCOPE ?  [name] Other Anaphylaxis    # # # CATS # # #    ROS Constitutional: Negative for fever and malaise/fatigue.  HENT: Negative for congestion.   Eyes: Negative for blurred vision.  Respiratory: Negative for shortness of breath.   Cardiovascular: Negative for chest pain, palpitations and leg swelling.  Gastrointestinal: Negative for abdominal pain, blood in stool and nausea.  Genitourinary: Negative for dysuria and frequency.  Musculoskeletal: Negative for falls.  Skin: Negative for rash.  Neurological: Positive for neuropathy in toes. Negative for dizziness, loss of consciousness and headaches.  Endo/Heme/Allergies: Negative for environmental allergies.  Psychiatric/Behavioral: Negative for depression. The patient is not nervous/anxious.      Objective:    Physical Exam Constitutional: She is oriented to person, place, and time. She appears well-developed and well-nourished.  No distress.  HENT:  Head: Normocephalic and atraumatic.  Nose: Nose normal.  Eyes: Right eye exhibits no discharge. Left eye exhibits no discharge.  Neck: Normal range of motion. Neck supple.  Cardiovascular: Normal rate and regular rhythm.   No murmur heard. Pulmonary/Chest: Effort normal and breath sounds normal.  Abdominal: Soft. Bowel sounds are normal. There is  no tenderness.  Musculoskeletal: She exhibits no edema.  Neurological: She is alert and oriented to person, place, and time.  Skin: Skin is warm and dry.  Psychiatric: She has a normal mood and affect.    BP 120/78 (BP Location: Left Arm, Patient Position: Sitting, Cuff Size: Normal)   Pulse 65   Temp 97.8 F (36.6 C) (Oral)   Resp 18   Ht 5' 8.5" (1.74 m)   Wt 166 lb (75.3 kg)   SpO2 96%   BMI 24.87 kg/m  Wt Readings from Last 3 Encounters:  11/21/17 166 lb (75.3 kg)  11/11/17 166 lb 3.2 oz (75.4 kg)  11/07/17 170 lb 4.8 oz (77.2 kg)     Lab Results  Component Value Date   WBC 11.6 (H) 11/07/2017   HGB 13.0 11/07/2017   HCT 37.6 11/07/2017   PLT 253 11/07/2017   GLUCOSE 128 (H) 11/07/2017   CHOL 241 (H) 01/31/2017   TRIG 63.0 01/31/2017   HDL 75.30 01/31/2017   LDLCALC 153 (H) 01/31/2017   ALT 21 11/07/2017   AST 19 11/07/2017   NA 138 11/07/2017   K 4.1 11/07/2017   CL 104 11/07/2017   CREATININE 0.81 11/07/2017   BUN 18 11/07/2017   CO2 24 11/07/2017   TSH 1.52 01/27/2016   INR 1.00 10/27/2016   HGBA1C 5.6 01/31/2017    Lab Results  Component Value Date   TSH 1.52 01/27/2016   Lab Results  Component Value Date   WBC 11.6 (H) 11/07/2017   HGB 13.0 11/07/2017   HCT 37.6 11/07/2017   MCV 91.3 11/07/2017   PLT 253 11/07/2017   Lab Results  Component Value Date   NA 138 11/07/2017   K 4.1 11/07/2017   CHLORIDE 106 03/07/2017   CO2 24 11/07/2017   GLUCOSE 128 (H) 11/07/2017   BUN 18 11/07/2017   CREATININE 0.81 11/07/2017   BILITOT 0.5 11/07/2017   ALKPHOS 137 (H) 11/07/2017    AST 19 11/07/2017   ALT 21 11/07/2017   PROT 7.0 11/07/2017   ALBUMIN 4.4 11/07/2017   CALCIUM 10.0 11/07/2017   ANIONGAP 10 11/07/2017   EGFR >60 03/07/2017   GFR 118.33 01/31/2017   Lab Results  Component Value Date   CHOL 241 (H) 01/31/2017   Lab Results  Component Value Date   HDL 75.30 01/31/2017   Lab Results  Component Value Date   LDLCALC 153 (H) 01/31/2017   Lab Results  Component Value Date   TRIG 63.0 01/31/2017   Lab Results  Component Value Date   CHOLHDL 3 01/31/2017   Lab Results  Component Value Date   HGBA1C 5.6 01/31/2017       Assessment & Plan:   Hyperlipidemia: Last cholesterol was 241, with LDL of 153. Will recheck today. Patient continues to decline statin therapy.  --Reiterated the benefits of statins in preventing stroke and CAD.  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  --referral to [facility]  Visual Acuity: Discussed with patient the possibility that an increase in glucose in her blood caused by the glucocorticoids portion of her lymphoma treatment can cause edema that can affect visual acuity. Encouraged her to discuss the issue with her oncologist and her ophthalmologist.  Problem List Items Addressed This Visit    None      I am having Lindsay Soda "  Harrison" maintain her Omega-3 Fatty Acids (OMEGA-3 PO), Calcium-Magnesium-Vitamin D (CALCIUM MAGNESIUM PO), Vitamin D, and dexamethasone.  No orders of the defined types were placed in this encounter.    Verdis Frederickson, Medical Student  Patient seen with and examined with student.  Agree with documentation See separate note for further documentation

## 2017-11-21 NOTE — Assessment & Plan Note (Signed)
Encouraged heart healthy diet, increase exercise, avoid trans fats, consider a krill oil cap daily 

## 2017-11-22 LAB — LIPID PANEL
Cholesterol: 260 mg/dL — ABNORMAL HIGH (ref 0–200)
HDL: 73.9 mg/dL (ref >39.00)
LDL Cholesterol: 167 mg/dL — ABNORMAL HIGH (ref 0–99)
NonHDL: 185.79
Total CHOL/HDL Ratio: 4
Triglycerides: 93 mg/dL (ref 0.0–149.0)
VLDL: 18.6 mg/dL (ref 0.0–40.0)

## 2017-11-22 LAB — VITAMIN D 25 HYDROXY (VIT D DEFICIENCY, FRACTURES): VITD: 22.44 ng/mL — ABNORMAL LOW (ref 30.00–100.00)

## 2017-11-22 LAB — HEMOGLOBIN A1C: Hgb A1c MFr Bld: 5.8 % (ref 4.6–6.5)

## 2017-11-22 LAB — TSH: TSH: 1.36 u[IU]/mL (ref 0.35–4.50)

## 2017-11-23 DIAGNOSIS — M25511 Pain in right shoulder: Secondary | ICD-10-CM | POA: Diagnosis not present

## 2017-11-23 DIAGNOSIS — M25531 Pain in right wrist: Secondary | ICD-10-CM | POA: Diagnosis not present

## 2017-11-23 DIAGNOSIS — M7531 Calcific tendinitis of right shoulder: Secondary | ICD-10-CM | POA: Diagnosis not present

## 2017-11-23 DIAGNOSIS — M542 Cervicalgia: Secondary | ICD-10-CM | POA: Diagnosis not present

## 2017-11-23 MED ORDER — VITAMIN D (ERGOCALCIFEROL) 1.25 MG (50000 UNIT) PO CAPS: 50000.0000 [IU] | ORAL_CAPSULE | ORAL | 4 refills | Status: DC

## 2017-11-23 NOTE — Addendum Note (Signed)
Addended by: Magdalene Molly A on: 11/23/2017 09:24 AM   Modules accepted: Orders

## 2017-12-08 DIAGNOSIS — H524 Presbyopia: Secondary | ICD-10-CM | POA: Diagnosis not present

## 2017-12-08 DIAGNOSIS — H2513 Age-related nuclear cataract, bilateral: Secondary | ICD-10-CM | POA: Diagnosis not present

## 2017-12-08 DIAGNOSIS — H52203 Unspecified astigmatism, bilateral: Secondary | ICD-10-CM | POA: Diagnosis not present

## 2018-01-06 NOTE — Progress Notes (Signed)
[name]     HEMATOLOGY/ONCOLOGY CLINIC NOTE  Date of Service:  01/09/18     Patient Care Team: [name], MD as PCP - General (Family Medicine) [name] M.D. (ENT)  CHIEF COMPLAINTS/PURPOSE OF CONSULTATION:   F/u for follicular lymphoma/DLBCL  HISTORY OF PRESENTING ILLNESS:   Please see previous note for details of initial presentation.  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 She has completed her 6 cycles of R CHOP and is currently in complete remission. She presents today for her eighth round of maintenance Rituxan. The patient's last visit with [place] was on 11/07/17. The pt reports that she is doing well overall and enjoyed her recent trip to [place].   The pt reports that she has had no new constitutional symptoms in the interim. She notes that she has had some recent leg cramps and has begun taking magnesium replacement. She also notes that her energy levels have been very good, being able to hike many miles at a time and high elevations.   She is taking 50k units of Vitamin D as well under the management of her PCP.    Lab results today (01/09/18) of CBC w/diff, CMP is as follows: all values are WNL except for ANC at 8.8k, Lymphs abs at 200, Glucose at 121, AST at 11. 01/09/18 LDH at 201  On review of systems, pt reports good energy levels, staying active, and denies noticing any new lumps or bumps, fevers, chills, night sweats, unexpected weight loss, and any other symptoms.   MEDICAL HISTORY:  Past Medical History:  Diagnosis Date  . Anemia    h/o iron deficiency secondary to heavy menses  . Arthritis    left hand index finger  . Diffuse large B cell lymphoma ([place])   . Grief reaction 01/27/2016  . H/O measles    as a child  . History of chicken pox    as a child  . History of PCOS   . Hyperlipidemia, mixed 01/27/2016  . Lymphadenopathy   . 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 Preventative  health care 01/27/2016  . Vitamin D deficiency   . Wears glasses     SURGICAL HISTORY: Past Surgical History:  Procedure Laterality Date  . COLONOSCOPY    . IR GENERIC HISTORICAL  05/21/2016   IR FLUORO GUIDE PORT INSERTION RIGHT 05/21/2016 Arne Cleveland, MD [facility]  . IR GENERIC HISTORICAL  05/21/2016   IR US GUIDE VASC ACCESS RIGHT 05/21/2016 Arne Cleveland, MD [facility]  . IR REMOVAL TUN ACCESS W/ PORT W/O FL MOD SED  10/27/2016  . SKIN BIOPSY Left    face  . THYROGLOSSAL DUCT CYST N/A 04/16/2016   Procedure: THYROGLOSSAL DUCT CYST excision;  Surgeon: [name], MD;  Location: [facility];  Service: ENT;  Laterality: N/A;    SOCIAL HISTORY: Social History   Socioeconomic History  . Marital status: Widowed    Spouse name: Not on file  . Number of children: Not on file  . Years of education: Not on file  . Highest education level: Not on file  Occupational History  . Occupation: Psychotherapist  Social Needs  . Financial resource strain: Not on file  . Food insecurity:    Worry: Not on file    Inability: Not on file  . Transportation needs:    Medical: Not on file    Non-medical: Not on file  Tobacco Use  . Smoking status:  Never Smoker  . Smokeless tobacco: Never Used  Substance and Sexual Activity  . Alcohol use: Yes    Alcohol/week: 3.0 - 6.0 standard drinks    Types: 3 - 6 Standard drinks or equivalent per week    Comment: 5 glasses of wine a week.  . Drug use: No  . Sexual activity: Never    Partners: Male    Birth control/protection: None  Lifestyle  . Physical activity:    Days per week: Not on file    Minutes per session: Not on file  . Stress: Not on file  Relationships  . Social connections:    Talks on phone: Not on file    Gets together: Not on file    Attends religious service: Not on file    Active member of club or organization: Not on file    Attends meetings of clubs or organizations: Not on file    Relationship status: Not on file    . Intimate partner violence:    Fear of current or ex partner: Not on file    Emotionally abused: Not on file    Physically abused: Not on file    Forced sexual activity: Not on file  Other Topics Concern  . Not on file  Social History Narrative   Married. Education: [employer]. Exercise: walking, yoga, and bicycling daily for 1-2 hours. Lives alone, works as Management consultant    FAMILY HISTORY: Family History  Problem Relation Age of Onset  . Arthritis Mother        rheumatoid  . Heart disease Father        CHF  . COPD Sister        Emphysema, h/o cigarettes  . Other Sister        pituatary tumor  . Arthritis Sister   . Obesity Sister   . Cancer Paternal Grandfather        cancer  . Stroke Maternal Aunt     ALLERGIES:  is allergic to erythromycin and other.  MEDICATIONS:  Current Outpatient Medications  Medication Sig Dispense Refill  . Calcium-Magnesium-Vitamin D (CALCIUM MAGNESIUM PO) Take 1 tablet by mouth daily.    . Cholecalciferol (VITAMIN D) 2000 units CAPS Take 2,000 Units by mouth daily.    [name] dexamethasone (DECADRON) 4 MG tablet TAKE 3 TABLETS BY MOUTH TWICE DAILY, TAKE 1 DOSE IN THE MORNING AND 1 DOSE IN THE EVENING THE DAY PRIOR TO Rituxan 12 tablet 6  . Omega-3 Fatty Acids (OMEGA-3 PO) Take 2 capsules by mouth daily. Omega 3 1280 mg    . Vitamin D, Ergocalciferol, (DRISDOL) 50000 units CAPS capsule Take 1 capsule (50,000 Units total) by mouth every 7 (seven) days. 4 capsule 4   No current facility-administered medications for this visit.     REVIEW OF SYSTEMS:    A 10+ POINT REVIEW OF SYSTEMS WAS OBTAINED including neurology, dermatology, psychiatry, cardiac, respiratory, lymph, extremities, GI, GU, Musculoskeletal, constitutional, breasts, reproductive, HEENT.  All pertinent positives are noted in the HPI.  All others are negative.   PHYSICAL EXAMINATION:  ECOG PERFORMANCE STATUS: 0 - Asymptomatic  Vitals:   01/09/18 0859  BP: 128/61  Pulse: 63  Resp:  18  Temp: 98.3 F (36.8 C)  SpO2: 100%   Filed Weights   01/09/18 0859  Weight: 168 lb 8 oz (76.4 kg)   .Body mass index is 25.25 kg/m.  GENERAL:alert, in no acute distress and comfortable SKIN: no acute rashes, no significant lesions EYES: conjunctiva are  pink and non-injected, sclera anicteric OROPHARYNX: MMM, no exudates, no oropharyngeal erythema or ulceration NECK: supple, no JVD LYMPH:  no palpable lymphadenopathy in the cervical, axillary or inguinal regions LUNGS: clear to auscultation b/l with normal respiratory effort HEART: regular rate & rhythm ABDOMEN:  normoactive bowel sounds , non tender, not distended. No palpable hepatosplenomegaly.  Extremity: no pedal edema PSYCH: alert & oriented x 3 with fluent speech NEURO: no focal motor/sensory deficits   LABORATORY DATA:  I have reviewed the data as listed  . CBC Latest Ref Rng & Units 01/09/2018 11/07/2017 09/12/2017  WBC 3.9 - 10.3 K/uL 9.6 11.6(H) 8.9  Hemoglobin 11.6 - 15.9 g/dL 13.2 13.0 13.0  Hematocrit 34.8 - 46.6 % 39.0 37.6 38.4  Platelets 145 - 400 K/uL 276 253 286   CBC    Component Value Date/Time   WBC 9.6 01/09/2018 0816   RBC 4.14 01/09/2018 0816   HGB 13.2 01/09/2018 0816   HGB 13.2 07/11/2017 0757   HGB 13.3 03/07/2017 0842   HCT 39.0 01/09/2018 0816   HCT 39.1 03/07/2017 0842   PLT 276 01/09/2018 0816   PLT 291 07/11/2017 0757   PLT 283 03/07/2017 0842   MCV 94.1 01/09/2018 0816   MCV 91.4 03/07/2017 0842   MCH 32.0 01/09/2018 0816   MCHC 34.0 01/09/2018 0816   RDW 14.1 01/09/2018 0816   RDW 13.3 03/07/2017 0842   LYMPHSABS 0.2 (L) 01/09/2018 0816   LYMPHSABS 0.4 (L) 03/07/2017 0842   MONOABS 0.6 01/09/2018 0816   MONOABS 0.7 03/07/2017 0842   EOSABS 0.0 01/09/2018 0816   EOSABS 0.0 03/07/2017 0842   BASOSABS 0.0 01/09/2018 0816   BASOSABS 0.0 03/07/2017 0842    . CMP Latest Ref Rng & Units 01/09/2018 11/07/2017 09/12/2017  Glucose 70 - 99 mg/dL 121(H) 128(H) 137  BUN 8 - 23 mg/dL 18  18 18   Creatinine 0.44 - 1.00 mg/dL 0.75 0.81 0.74  Sodium 135 - 145 mmol/L 141 138 136  Potassium 3.5 - 5.1 mmol/L 4.0 4.1 4.2  Chloride 98 - 111 mmol/L 107 104 105  CO2 22 - 32 mmol/L 23 24 22   Calcium 8.9 - 10.3 mg/dL 10.1 10.0 9.9  Total Protein 6.5 - 8.1 g/dL 6.6 7.0 6.9  Total Bilirubin 0.3 - 1.2 mg/dL 0.6 0.5 0.5  Alkaline Phos 38 - 126 U/L 106 137(H) 116  AST 15 - 41 U/L 11(L) 19 15  ALT 0 - 44 U/L 16 21 24    Component     Latest Ref Rng & Units 05/07/2016  Hepatitis B Surface Ag     Negative Negative  Hep B Core Ab, Tot     Negative Negative  Hep C Virus Ab     0.0 - 0.9 s/co ratio <0.1   . 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  Initial PET/CT showed Scattered small mildly hypermetabolic lymph nodes within the neck, left hilum, left axilla and left groin, potentially related the patient's lymphoma. Focal hypermetabolic activity within the left sternal manubrium and left iliac bone, also potentially related to the patient's lymphoma. No evidence of solid visceral organ involvement.  CT guided bone marrow biopsy done -- no evidence of lymphoma involving Bone marrow. Normal LDH.  No constitutional symptoms. Patient has no significant medical comorbidities at baseline. ECHO with nl EF as expected.  Patient has completed 6 cycles of R CHOP PET/CT s/p 6 cycles of R-CHOP shows complete metabolic response CT chest/abd/pelvis 07/07/2017 - showed no radipgraphic evidence of lymphoma progression at this time.  Lab Results  Component Value Date   LDH 201 (H) 01/09/2018   2) Rituxan hypersensitivity (mild grade 1 hives on the back  - resolved with solumedrol and antihistamines. No issues with dexamethasone pretreatment -Patient  will continue dexamethasone premedication to reduce risk of Rituxan hypersensitivity.  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 -No live attenuated virus vaccines -Continue age appropriate cancer screening and mammograms with PCP  -Counseled on infection prevention strategies. She is up to date on her vaccinations.  -Will do repeat CT scans in march 2020, earlier if concerning symptoms present.   Please schedule injection appointment for flu shot in 2 weeks RTC for next dose of maintenance Rituxan in 60days with labs and MD visit    . Orders Placed This Encounter  Procedures  . CBC with Differential/Platelet    Standing Status:   Future    Standing Expiration Date:   02/19/2019  . CMP ([place] only)    Standing Status:   Future    Standing Expiration Date:   01/16/2019  . Lactate dehydrogenase    Standing Status:   Future    Standing Expiration Date:   01/16/2019    All of the patients questions were answered with apparent satisfaction. The patient knows to call the clinic with any problems, questions or concerns.  The total time spent in the appt was 25 minutes and more than 50% was on counseling and direct patient cares.   Sullivan Lone MD MS AAHIVMS [facility] Hematology/Oncology Physician [facility]  (Office):       [phone] (Work cell):  [phone] (Fax):           [phone]  I, Baldwin Jamaica, am acting as a scribe for Dr. Irene Limbo  .I have reviewed the above documentation for accuracy and completeness, and I agree with the above. Brunetta Genera MD

## 2018-01-09 ENCOUNTER — Telehealth: Payer: Self-pay | Admitting: Hematology

## 2018-01-09 ENCOUNTER — Encounter: Payer: Self-pay | Admitting: Hematology

## 2018-01-09 ENCOUNTER — Inpatient Hospital Stay: Payer: Medicare Other | Attending: Hematology

## 2018-01-09 ENCOUNTER — Inpatient Hospital Stay (HOSPITAL_BASED_OUTPATIENT_CLINIC_OR_DEPARTMENT_OTHER): Payer: Medicare Other | Admitting: Hematology

## 2018-01-09 ENCOUNTER — Inpatient Hospital Stay: Payer: Medicare Other

## 2018-01-09 VITALS — BP 120/67 | HR 54 | Temp 98.6°F | Resp 16

## 2018-01-09 VITALS — BP 128/61 | HR 63 | Temp 98.3°F | Resp 18 | Ht 68.5 in | Wt 168.5 lb

## 2018-01-09 DIAGNOSIS — C8330 Diffuse large B-cell lymphoma, unspecified site: Secondary | ICD-10-CM | POA: Diagnosis not present

## 2018-01-09 DIAGNOSIS — E559 Vitamin D deficiency, unspecified: Secondary | ICD-10-CM

## 2018-01-09 DIAGNOSIS — Z23 Encounter for immunization: Secondary | ICD-10-CM | POA: Insufficient documentation

## 2018-01-09 DIAGNOSIS — Z8249 Family history of ischemic heart disease and other diseases of the circulatory system: Secondary | ICD-10-CM

## 2018-01-09 DIAGNOSIS — C8298 Follicular lymphoma, unspecified, lymph nodes of multiple sites: Secondary | ICD-10-CM

## 2018-01-09 DIAGNOSIS — Z79899 Other long term (current) drug therapy: Secondary | ICD-10-CM | POA: Diagnosis not present

## 2018-01-09 DIAGNOSIS — Z5112 Encounter for antineoplastic immunotherapy: Secondary | ICD-10-CM

## 2018-01-09 DIAGNOSIS — G620 Drug-induced polyneuropathy: Secondary | ICD-10-CM | POA: Insufficient documentation

## 2018-01-09 LAB — CBC WITH DIFFERENTIAL/PLATELET
Basophils Absolute: 0 10*3/uL (ref 0.0–0.1)
Basophils Relative: 0 %
Eosinophils Absolute: 0 10*3/uL (ref 0.0–0.5)
Eosinophils Relative: 0 %
HCT: 39 % (ref 34.8–46.6)
Hemoglobin: 13.2 g/dL (ref 11.6–15.9)
Lymphocytes Relative: 2 %
Lymphs Abs: 0.2 10*3/uL — ABNORMAL LOW (ref 0.9–3.3)
MCH: 32 pg (ref 25.1–34.0)
MCHC: 34 g/dL (ref 31.5–36.0)
MCV: 94.1 fL (ref 79.5–101.0)
Monocytes Absolute: 0.6 10*3/uL (ref 0.1–0.9)
Monocytes Relative: 6 %
Neutro Abs: 8.8 10*3/uL — ABNORMAL HIGH (ref 1.5–6.5)
Neutrophils Relative %: 92 %
Platelets: 276 10*3/uL (ref 145–400)
RBC: 4.14 MIL/uL (ref 3.70–5.45)
RDW: 14.1 % (ref 11.2–14.5)
WBC: 9.6 10*3/uL (ref 3.9–10.3)

## 2018-01-09 LAB — CMP (CANCER CENTER ONLY)
ALT: 16 U/L (ref 0–44)
AST: 11 U/L — ABNORMAL LOW (ref 15–41)
Albumin: 4 g/dL (ref 3.5–5.0)
Alkaline Phosphatase: 106 U/L (ref 38–126)
Anion gap: 11 (ref 5–15)
BUN: 18 mg/dL (ref 8–23)
CO2: 23 mmol/L (ref 22–32)
Calcium: 10.1 mg/dL (ref 8.9–10.3)
Chloride: 107 mmol/L (ref 98–111)
Creatinine: 0.75 mg/dL (ref 0.44–1.00)
GFR, Est AFR Am: >60 mL/min (ref >60)
GFR, Estimated: >60 mL/min (ref >60)
Glucose, Bld: 121 mg/dL — ABNORMAL HIGH (ref 70–99)
Potassium: 4 mmol/L (ref 3.5–5.1)
Sodium: 141 mmol/L (ref 135–145)
Total Bilirubin: 0.6 mg/dL (ref 0.3–1.2)
Total Protein: 6.6 g/dL (ref 6.5–8.1)

## 2018-01-09 LAB — LACTATE DEHYDROGENASE: LDH: 201 U/L — ABNORMAL HIGH (ref 98–192)

## 2018-01-09 MED ORDER — SODIUM CHLORIDE 0.9 % IV SOLN
Freq: Once | INTRAVENOUS | Status: AC
  Administered 2018-01-09: 10:00:00 via INTRAVENOUS
  Filled 2018-01-09: qty 250

## 2018-01-09 MED ORDER — SODIUM CHLORIDE 0.9 % IV SOLN
375.0000 mg/m2 | Freq: Once | INTRAVENOUS | Status: AC
  Administered 2018-01-09: 700 mg via INTRAVENOUS
  Filled 2018-01-09: qty 50

## 2018-01-09 MED ORDER — DIPHENHYDRAMINE HCL 25 MG PO CAPS
ORAL_CAPSULE | ORAL | Status: AC
  Filled 2018-01-09: qty 1

## 2018-01-09 MED ORDER — ACETAMINOPHEN 325 MG PO TABS
650.0000 mg | ORAL_TABLET | Freq: Once | ORAL | Status: AC
  Administered 2018-01-09: 650 mg via ORAL

## 2018-01-09 MED ORDER — DIPHENHYDRAMINE HCL 25 MG PO CAPS
50.0000 mg | ORAL_CAPSULE | Freq: Once | ORAL | Status: AC
  Administered 2018-01-09: 50 mg via ORAL

## 2018-01-09 MED ORDER — SODIUM CHLORIDE 0.9 % IV SOLN
12.0000 mg | Freq: Once | INTRAVENOUS | Status: AC
  Administered 2018-01-09: 12 mg via INTRAVENOUS
  Filled 2018-01-09: qty 1.2

## 2018-01-09 MED ORDER — ACETAMINOPHEN 325 MG PO TABS
ORAL_TABLET | ORAL | Status: AC
  Filled 2018-01-09: qty 2

## 2018-01-09 NOTE — Patient Instructions (Signed)
[facility] Discharge Instructions for Patients Receiving Chemotherapy  Today you received the following chemotherapy agents rituxan  To help prevent nausea and vomiting after your treatment, we encourage you to take your nausea medication as directed.   If you develop nausea and vomiting that is not controlled by your nausea medication, call the clinic.   BELOW ARE SYMPTOMS THAT SHOULD BE REPORTED IMMEDIATELY:  *FEVER GREATER THAN 100.5 F  *CHILLS WITH OR WITHOUT FEVER  NAUSEA AND VOMITING THAT IS NOT CONTROLLED WITH YOUR NAUSEA MEDICATION  *UNUSUAL SHORTNESS OF BREATH  *UNUSUAL BRUISING OR BLEEDING  TENDERNESS IN MOUTH AND THROAT WITH OR WITHOUT PRESENCE OF ULCERS  *URINARY PROBLEMS  *BOWEL PROBLEMS  UNUSUAL RASH Items with * indicate a potential emergency and should be followed up as soon as possible.  Feel free to call the clinic should you have any questions or concerns. The clinic phone number is [phone].  Please show the CHEMO ALERT CARD at check-in to the Emergency Department and triage nurse.   

## 2018-01-09 NOTE — Telephone Encounter (Signed)
Gave pt avs and calendar  °

## 2018-01-23 ENCOUNTER — Inpatient Hospital Stay: Payer: Medicare Other

## 2018-01-23 VITALS — BP 113/81 | HR 63 | Temp 98.7°F | Resp 16

## 2018-01-23 DIAGNOSIS — C8330 Diffuse large B-cell lymphoma, unspecified site: Secondary | ICD-10-CM | POA: Diagnosis not present

## 2018-01-23 DIAGNOSIS — G620 Drug-induced polyneuropathy: Secondary | ICD-10-CM | POA: Diagnosis not present

## 2018-01-23 DIAGNOSIS — E559 Vitamin D deficiency, unspecified: Secondary | ICD-10-CM | POA: Diagnosis not present

## 2018-01-23 DIAGNOSIS — Z5112 Encounter for antineoplastic immunotherapy: Secondary | ICD-10-CM | POA: Diagnosis not present

## 2018-01-23 DIAGNOSIS — Z79899 Other long term (current) drug therapy: Secondary | ICD-10-CM | POA: Diagnosis not present

## 2018-01-23 DIAGNOSIS — Z23 Encounter for immunization: Secondary | ICD-10-CM | POA: Diagnosis not present

## 2018-01-23 MED ORDER — INFLUENZA VAC SPLIT QUAD 0.5 ML IM SUSY
0.5000 mL | PREFILLED_SYRINGE | Freq: Once | INTRAMUSCULAR | Status: AC
  Administered 2018-01-23: 0.5 mL via INTRAMUSCULAR

## 2018-01-23 MED ORDER — INFLUENZA VAC SPLIT QUAD 0.5 ML IM SUSY
PREFILLED_SYRINGE | INTRAMUSCULAR | Status: AC
  Filled 2018-01-23: qty 0.5

## 2018-01-31 ENCOUNTER — Encounter: Payer: Self-pay | Admitting: Hematology

## 2018-01-31 DIAGNOSIS — Z7189 Other specified counseling: Secondary | ICD-10-CM | POA: Insufficient documentation

## 2018-02-01 ENCOUNTER — Ambulatory Visit: Payer: Medicare Other | Admitting: *Deleted

## 2018-02-01 NOTE — Progress Notes (Addendum)
Subjective:   Lindsay Harrison is a 72 y.o. female who presents for Medicare Annual (Subsequent) preventive examination.  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  Review of Systems: No ROS.  Medicare Wellness Visit. Additional risk factors are reflected in the social history. Cardiac Risk Factors include: advanced age (>79men, >31 women);dyslipidemia  Home Safety/Smoke Alarms: Feels safe in home. Smoke alarms in place. Lives alone in 1 story home.  Female:      Mammo- will discuss with PCP at next visit       Dexa scan- will discuss with PCP at next visit         CCS- next due 03/2021 per pt.     Objective:     Vitals: BP 120/78 (BP Location: Left Arm, Patient Position: Sitting, Cuff Size: Normal)   Pulse 63   Ht 5\' 9"  (1.753 m)   Wt 169 lb (76.7 kg)   SpO2 97%   BMI 24.96 kg/m   Body mass index is 24.96 kg/m.  Advanced Directives 02/02/2018 06/10/2017 05/09/2017 01/31/2017 01/12/2017 10/27/2016 09/06/2016  Does Patient Have a Medical Advance Directive? Yes Yes Yes Yes Yes Yes Yes  Type of Paramedic of [place];Living will [place];Living will Healthcare Power of [place];Living will - [place];Living will [place]  Does patient want to make changes to medical advance directive? - - - - - - -  Copy of [place] in Chart? Yes - Yes - - Yes Yes    Tobacco Social History   Tobacco Use  Smoking Status Never Smoker  Smokeless Tobacco Never Used     Counseling given: Not Answered   Clinical Intake:     Pain : No/denies pain                 Past Medical History:  Diagnosis Date  . Anemia    h/o iron deficiency secondary to heavy menses  . Arthritis    left hand index finger  . Diffuse large B cell lymphoma ([place])   . Grief reaction 01/27/2016  . H/O measles    as a child  . History of chicken pox    as a child  . History of PCOS    . Hyperlipidemia, mixed 01/27/2016  . Lymphadenopathy   . 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 Preventative health care 01/27/2016  . Vitamin D deficiency   . Wears glasses    Past Surgical History:  Procedure Laterality Date  . COLONOSCOPY    . IR GENERIC HISTORICAL  05/21/2016   IR FLUORO GUIDE PORT INSERTION RIGHT 05/21/2016 Arne Cleveland, MD [facility]  . IR GENERIC HISTORICAL  05/21/2016   IR US GUIDE VASC ACCESS RIGHT 05/21/2016 Arne Cleveland, MD [facility]  . IR REMOVAL TUN ACCESS W/ PORT W/O FL MOD SED  10/27/2016  . SKIN BIOPSY Left    face  . THYROGLOSSAL DUCT CYST N/A 04/16/2016   Procedure: THYROGLOSSAL DUCT CYST excision;  Surgeon: [name], MD;  Location: [facility] OR;  Service: ENT;  Laterality: N/A;   Family History  Problem Relation Age of Onset  . Arthritis Mother        rheumatoid  . Heart disease Father        CHF  . COPD Sister        Emphysema, h/o cigarettes  . Other Sister        pituatary tumor  . Arthritis  Sister   . Obesity Sister   . Cancer Paternal Grandfather        cancer  . Stroke Maternal Aunt    Social History   Socioeconomic History  . Marital status: Widowed    Spouse name: Not on file  . Number of children: Not on file  . Years of education: Not on file  . Highest education level: Not on file  Occupational History  . Occupation: Psychotherapist  Social Needs  . Financial resource strain: Not on file  . Food insecurity:    Worry: Not on file    Inability: Not on file  . Transportation needs:    Medical: Not on file    Non-medical: Not on file  Tobacco Use  . Smoking status: Never Smoker  . Smokeless tobacco: Never Used  Substance and Sexual Activity  . Alcohol use: Yes    Alcohol/week: 3.0 - 6.0 standard drinks    Types: 3 - 6 Standard drinks or equivalent per week    Comment: 5 glasses of wine a week.  . Drug use: No  . Sexual activity: Never    Partners: Male    Birth control/protection: None  Lifestyle  .  Physical activity:    Days per week: Not on file    Minutes per session: Not on file  . Stress: Not on file  Relationships  . Social connections:    Talks on phone: Not on file    Gets together: Not on file    Attends religious service: Not on file    Active member of club or organization: Not on file    Attends meetings of clubs or organizations: Not on file    Relationship status: Not on file  Other Topics Concern  . Not on file  Social History Narrative   Married. Education: [employer]. Exercise: walking, yoga, and bicycling daily for 1-2 hours. Lives alone, works as psychotherapist    Outpatient Encounter Medications as of 02/02/2018  Medication Sig  . Calcium-Magnesium-Vitamin D (CALCIUM MAGNESIUM PO) Take 1 tablet by mouth daily.  . Cholecalciferol (VITAMIN D) 2000 units CAPS Take 2,000 Units by mouth daily.  [name] dexamethasone (DECADRON) 4 MG tablet TAKE 3 TABLETS BY MOUTH TWICE DAILY, TAKE 1 DOSE IN THE MORNING AND 1 DOSE IN THE EVENING THE DAY PRIOR TO Rituxan  . Omega-3 Fatty Acids (OMEGA-3 PO) Take 2 capsules by mouth daily. Omega 3 1280 mg  . Vitamin D, Ergocalciferol, (DRISDOL) 50000 units CAPS capsule Take 1 capsule (50,000 Units total) by mouth every 7 (seven) days.   No facility-administered encounter medications on file as of 02/02/2018.     Activities of Daily Living In your present state of health, do you have any difficulty performing the following activities: 02/02/2018  Hearing? N  Vision? N  Difficulty concentrating or making decisions? N  Walking or climbing stairs? N  Dressing or bathing? N  Doing errands, shopping? N  Preparing Food and eating ? N  Using the Toilet? N  In the past six months, have you accidently leaked urine? N  Do you have problems with loss of bowel control? N  Managing your Medications? N  Managing your Finances? N  Housekeeping or managing your Housekeeping? N  Some recent data might be hidden    Patient Care Team: [name],  MD as PCP - General (Family Medicine)    Assessment:   This is a routine wellness examination for Tonie. Physical assessment deferred to PCP.  Exercise  Activities and Dietary recommendations Current Exercise Habits: Home exercise routine, Type of exercise: walking, Frequency (Times/Week): 5, Intensity: Mild Diet (meal preparation, eat out, water intake, caffeinated beverages, dairy products, fruits and vegetables): in general, a "healthy" diet  , well balanced     Goals    . continue being socially and physically active (pt-stated)       Fall Risk Fall Risk  02/02/2018 01/31/2017 01/31/2017 11/22/2016 08/06/2015  Falls in the past year? No No No No No  Comment - - - Emmi Telephone Survey: data to providers prior to load -   Depression Screen PHQ 2/9 Scores 02/02/2018 01/31/2017 01/31/2017 08/06/2015  PHQ - 2 Score 0 0 0 0     Cognitive Function Ad8 score reviewed for issues:  Issues making decisions:no  Less interest in hobbies / activities:no  Repeats questions, stories (family complaining):no  Trouble using ordinary gadgets (microwave, computer, phone):no  Forgets the month or year: no  Mismanaging finances: no  Remembering appts:no  Daily problems with thinking and/or memory:no Ad8 score is=0       Immunization History  Administered Date(s) Administered  . Hepatitis A 07/17/2007, 04/04/2008  . Hepatitis B 07/17/2007, 09/11/2007, 02/15/2008  . Influenza Split 02/24/2013  . Influenza, High Dose Seasonal PF 01/27/2016  . Influenza,inj,Quad PF,6+ Mos 01/16/2014, 02/05/2015, 01/10/2017, 01/23/2018  . Pneumococcal Conjugate-13 01/16/2014  . Pneumococcal Polysaccharide-23 10/18/2016  . Pneumococcal-Unspecified 02/12/2009  . Td 10/27/2005  . Tdap 02/05/2015  . Typhoid Inactivated 07/17/2007  . Zoster 06/26/2007   Screening Tests Health Maintenance  Topic Date Due  . MAMMOGRAM  12/28/2018  . COLONOSCOPY  03/14/2021  . TETANUS/TDAP  02/04/2025  . INFLUENZA VACCINE   Completed  . DEXA SCAN  Completed  . Hepatitis C Screening  Completed  . PNA vac Low Risk Adult  Completed      Plan:    Please schedule your next medicare wellness visit with me in 1 yr.  Continue to eat heart healthy diet (full of fruits, vegetables, whole grains, lean protein, water--limit salt, fat, and sugar intake) and increase physical activity as tolerated.    I have personally reviewed and noted the following in the patient's chart:   . Medical and social history . Use of alcohol, tobacco or illicit drugs  . Current medications and supplements . Functional ability and status . Nutritional status . Physical activity . Advanced directives . List of other physicians . Hospitalizations, surgeries, and ER visits in previous 12 months . Vitals . Screenings to include cognitive, depression, and falls . Referrals and appointments  In addition, I have reviewed and discussed with patient certain preventive protocols, quality metrics, and best practice recommendations. A written personalized care plan for preventive services as well as general preventive health recommendations were provided to patient.     Shela Nevin, RN  02/02/2018   I have reviewed the above MWE by Ms. Vevelyn Royals and agree with her documentationDenny Peon MD

## 2018-02-02 ENCOUNTER — Encounter: Payer: Self-pay | Admitting: *Deleted

## 2018-02-02 ENCOUNTER — Ambulatory Visit (INDEPENDENT_AMBULATORY_CARE_PROVIDER_SITE_OTHER): Payer: Medicare Other | Admitting: *Deleted

## 2018-02-02 VITALS — BP 120/78 | HR 63 | Ht 69.0 in | Wt 169.0 lb

## 2018-02-02 DIAGNOSIS — Z0000 Encounter for general adult medical examination without abnormal findings: Secondary | ICD-10-CM | POA: Diagnosis not present

## 2018-02-02 NOTE — Patient Instructions (Signed)
Please schedule your next medicare wellness visit with me in 1 yr.  Continue to eat heart healthy diet (full of fruits, vegetables, whole grains, lean protein, water--limit salt, fat, and sugar intake) and increase physical activity as tolerated.   Lindsay Harrison , Thank you for taking time to come for your Medicare Wellness Visit. I appreciate your ongoing commitment to your health goals. Please review the following plan we discussed and let me know if I can assist you in the future.   These are the goals we discussed: Goals    . continue being socially and physically active (pt-stated)       This is a list of the screening recommended for you and due dates:  Health Maintenance  Topic Date Due  . Mammogram  12/28/2018  . Colon Cancer Screening  03/14/2021  . Tetanus Vaccine  02/04/2025  . Flu Shot  Completed  . DEXA scan (bone density measurement)  Completed  .  Hepatitis C: One time screening is recommended by Center for Disease Control  (CDC) for  adults born from [birth year] through 1965.   Completed  . Pneumonia vaccines  Completed    Health Maintenance for Postmenopausal Women Menopause is a normal process in which your reproductive ability comes to an end. This process happens gradually over a span of months to years, usually between the ages of 33 and 9. Menopause is complete when you have missed 12 consecutive menstrual periods. It is important to talk with your health care provider about some of the most common conditions that affect postmenopausal women, such as heart disease, cancer, and bone loss (osteoporosis). Adopting a healthy lifestyle and getting preventive care can help to promote your health and wellness. Those actions can also lower your chances of developing some of these common conditions. What should I know about menopause? During menopause, you may experience a number of symptoms, such as:  Moderate-to-severe hot flashes.  Night sweats.  Decrease in sex  drive.  Mood swings.  Headaches.  Tiredness.  Irritability.  Memory problems.  Insomnia.  Choosing to treat or not to treat menopausal changes is an individual decision that you make with your health care provider. What should I know about hormone replacement therapy and supplements? Hormone therapy products are effective for treating symptoms that are associated with menopause, such as hot flashes and night sweats. Hormone replacement carries certain risks, especially as you become older. If you are thinking about using estrogen or estrogen with progestin treatments, discuss the benefits and risks with your health care provider. What should I know about heart disease and stroke? Heart disease, heart attack, and stroke become more likely as you age. This may be due, in part, to the hormonal changes that your body experiences during menopause. These can affect how your body processes dietary fats, triglycerides, and cholesterol. Heart attack and stroke are both medical emergencies. There are many things that you can do to help prevent heart disease and stroke:  Have your blood pressure checked at least every 1-2 years. High blood pressure causes heart disease and increases the risk of stroke.  If you are 71-42 years old, ask your health care provider if you should take aspirin to prevent a heart attack or a stroke.  Do not use any tobacco products, including cigarettes, chewing tobacco, or electronic cigarettes. If you need help quitting, ask your health care provider.  It is important to eat a healthy diet and maintain a healthy weight. ? Be sure to include  plenty of vegetables, fruits, low-fat dairy products, and lean protein. ? Avoid eating foods that are high in solid fats, added sugars, or salt (sodium).  Get regular exercise. This is one of the most important things that you can do for your health. ? Try to exercise for at least 150 minutes each week. The type of exercise that  you do should increase your heart rate and make you sweat. This is known as moderate-intensity exercise. ? Try to do strengthening exercises at least twice each week. Do these in addition to the moderate-intensity exercise.  Know your numbers.Ask your health care provider to check your cholesterol and your blood glucose. Continue to have your blood tested as directed by your health care provider.  What should I know about cancer screening? There are several types of cancer. Take the following steps to reduce your risk and to catch any cancer development as early as possible. Breast Cancer  Practice breast self-awareness. ? This means understanding how your breasts normally appear and feel. ? It also means doing regular breast self-exams. Let your health care provider know about any changes, no matter how small.  If you are 23 or older, have a clinician do a breast exam (clinical breast exam or CBE) every year. Depending on your age, family history, and medical history, it may be recommended that you also have a yearly breast X-ray (mammogram).  If you have a family history of breast cancer, talk with your health care provider about genetic screening.  If you are at high risk for breast cancer, talk with your health care provider about having an MRI and a mammogram every year.  Breast cancer (BRCA) gene test is recommended for women who have family members with BRCA-related cancers. Results of the assessment will determine the need for genetic counseling and BRCA1 and for BRCA2 testing. BRCA-related cancers include these types: ? Breast. This occurs in males or females. ? Ovarian. ? Tubal. This may also be called fallopian tube cancer. ? Cancer of the abdominal or pelvic lining (peritoneal cancer). ? Prostate. ? Pancreatic.  Cervical, Uterine, and Ovarian Cancer Your health care provider may recommend that you be screened regularly for cancer of the pelvic organs. These include your  ovaries, uterus, and vagina. This screening involves a pelvic exam, which includes checking for microscopic changes to the surface of your cervix (Pap test).  For women ages 21-65, health care providers may recommend a pelvic exam and a Pap test every three years. For women ages 64-65, they may recommend the Pap test and pelvic exam, combined with testing for human papilloma virus (HPV), every five years. Some types of HPV increase your risk of cervical cancer. Testing for HPV may also be done on women of any age who have unclear Pap test results.  Other health care providers may not recommend any screening for nonpregnant women who are considered low risk for pelvic cancer and have no symptoms. Ask your health care provider if a screening pelvic exam is right for you.  If you have had past treatment for cervical cancer or a condition that could lead to cancer, you need Pap tests and screening for cancer for at least 20 years after your treatment. If Pap tests have been discontinued for you, your risk factors (such as having a new sexual partner) need to be reassessed to determine if you should start having screenings again. Some women have medical problems that increase the chance of getting cervical cancer. In these cases, your  health care provider may recommend that you have screening and Pap tests more often.  If you have a family history of uterine cancer or ovarian cancer, talk with your health care provider about genetic screening.  If you have vaginal bleeding after reaching menopause, tell your health care provider.  There are currently no reliable tests available to screen for ovarian cancer.  Lung Cancer Lung cancer screening is recommended for adults 59-70 years old who are at high risk for lung cancer because of a history of smoking. A yearly low-dose CT scan of the lungs is recommended if you:  Currently smoke.  Have a history of at least 30 pack-years of smoking and you currently  smoke or have quit within the past 15 years. A pack-year is smoking an average of one pack of cigarettes per day for one year.  Yearly screening should:  Continue until it has been 15 years since you quit.  Stop if you develop a health problem that would prevent you from having lung cancer treatment.  Colorectal Cancer  This type of cancer can be detected and can often be prevented.  Routine colorectal cancer screening usually begins at age 27 and continues through age 41.  If you have risk factors for colon cancer, your health care provider may recommend that you be screened at an earlier age.  If you have a family history of colorectal cancer, talk with your health care provider about genetic screening.  Your health care provider may also recommend using home test kits to check for hidden blood in your stool.  A small camera at the end of a tube can be used to examine your colon directly (sigmoidoscopy or colonoscopy). This is done to check for the earliest forms of colorectal cancer.  Direct examination of the colon should be repeated every 5-10 years until age 26. However, if early forms of precancerous polyps or small growths are found or if you have a family history or genetic risk for colorectal cancer, you may need to be screened more often.  Skin Cancer  Check your skin from head to toe regularly.  Monitor any moles. Be sure to tell your health care provider: ? About any new moles or changes in moles, especially if there is a change in a mole's shape or color. ? If you have a mole that is larger than the size of a pencil eraser.  If any of your family members has a history of skin cancer, especially at a young age, talk with your health care provider about genetic screening.  Always use sunscreen. Apply sunscreen liberally and repeatedly throughout the day.  Whenever you are outside, protect yourself by wearing long sleeves, pants, a wide-brimmed hat, and  sunglasses.  What should I know about osteoporosis? Osteoporosis is a condition in which bone destruction happens more quickly than new bone creation. After menopause, you may be at an increased risk for osteoporosis. To help prevent osteoporosis or the bone fractures that can happen because of osteoporosis, the following is recommended:  If you are 44-75 years old, get at least 1,000 mg of calcium and at least 600 mg of vitamin D per day.  If you are older than age 25 but younger than age 71, get at least 1,200 mg of calcium and at least 600 mg of vitamin D per day.  If you are older than age 62, get at least 1,200 mg of calcium and at least 800 mg of vitamin D per day.  Smoking and excessive alcohol intake increase the risk of osteoporosis. Eat foods that are rich in calcium and vitamin D, and do weight-bearing exercises several times each week as directed by your health care provider. What should I know about how menopause affects my mental health? Depression may occur at any age, but it is more common as you become older. Common symptoms of depression include:  Low or sad mood.  Changes in sleep patterns.  Changes in appetite or eating patterns.  Feeling an overall lack of motivation or enjoyment of activities that you previously enjoyed.  Frequent crying spells.  Talk with your health care provider if you think that you are experiencing depression. What should I know about immunizations? It is important that you get and maintain your immunizations. These include:  Tetanus, diphtheria, and pertussis (Tdap) booster vaccine.  Influenza every year before the flu season begins.  Pneumonia vaccine.  Shingles vaccine.  Your health care provider may also recommend other immunizations. This information is not intended to replace advice given to you by your health care provider. Make sure you discuss any questions you have with your health care provider. Document Released:  06/11/2005 Document Revised: 11/07/2015 Document Reviewed: 01/21/2015 Elsevier Interactive Patient Education  2018 Elsevier Inc.  

## 2018-02-14 DIAGNOSIS — D225 Melanocytic nevi of trunk: Secondary | ICD-10-CM | POA: Diagnosis not present

## 2018-02-14 DIAGNOSIS — Z23 Encounter for immunization: Secondary | ICD-10-CM | POA: Diagnosis not present

## 2018-02-14 DIAGNOSIS — Z808 Family history of malignant neoplasm of other organs or systems: Secondary | ICD-10-CM | POA: Diagnosis not present

## 2018-02-14 DIAGNOSIS — Z86018 Personal history of other benign neoplasm: Secondary | ICD-10-CM | POA: Diagnosis not present

## 2018-02-14 DIAGNOSIS — L814 Other melanin hyperpigmentation: Secondary | ICD-10-CM | POA: Diagnosis not present

## 2018-02-14 DIAGNOSIS — L821 Other seborrheic keratosis: Secondary | ICD-10-CM | POA: Diagnosis not present

## 2018-02-17 DIAGNOSIS — Z1231 Encounter for screening mammogram for malignant neoplasm of breast: Secondary | ICD-10-CM | POA: Diagnosis not present

## 2018-03-07 ENCOUNTER — Encounter: Payer: Self-pay | Admitting: Hematology

## 2018-03-08 ENCOUNTER — Telehealth: Payer: Self-pay | Admitting: Hematology

## 2018-03-08 NOTE — Telephone Encounter (Signed)
GK out 11/8 - per Prentice moved appointments out one week. Left message for patient re change and new appointments for 11/22. Schedule mailed,

## 2018-03-09 NOTE — Telephone Encounter (Signed)
Spoke with Irene Limbo desk nurse re call back message patient left about 11/22 appointments. Per desk nurse patient cried as [birth year] is her birthday and she does not want to come in on her birthday. Per Patient friends [place] and she would like to be able to go. Appointments moved from 11/22 to 11/21. Left message for patient and mailed updated schedule.

## 2018-03-13 ENCOUNTER — Ambulatory Visit: Payer: Medicare Other | Admitting: Hematology

## 2018-03-13 ENCOUNTER — Other Ambulatory Visit: Payer: Medicare Other

## 2018-03-13 ENCOUNTER — Other Ambulatory Visit: Payer: Self-pay | Admitting: Family Medicine

## 2018-03-13 ENCOUNTER — Ambulatory Visit: Payer: Medicare Other

## 2018-03-22 ENCOUNTER — Other Ambulatory Visit: Payer: Self-pay

## 2018-03-22 NOTE — Progress Notes (Signed)
[name]     HEMATOLOGY/ONCOLOGY CLINIC NOTE  Date of Service:  03/23/18     Patient Care Team: [name], MD as PCP - General (Family Medicine) [name] M.D. (ENT)  CHIEF COMPLAINTS/PURPOSE OF CONSULTATION:   F/u for follicular lymphoma/DLBCL  HISTORY OF PRESENTING ILLNESS:   Please see previous note for details of initial presentation.  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 She has completed 6 cycles of R CHOP and is currently in complete remission. She presents today for her ninth round of maintenance Rituxan. The patient's last visit with [place] was on 01/09/18. The pt reports that she is doing well overall.   The pt reports that she has not developed any new concerns in the interim. She has continued to enjoy stable energy levels and continues keeping active. She denies noticing any new lumps or bumps, and denies any fevers, chills, or night sweats.   Lab results today (03/23/18) of CBC w/diff and CMP is as follows: all values are WNL except for ANC at 8.6k, Lymphs abs at 400, Glucose at 135.  On review of systems, pt reports good energy levels, eating well, staying active, moving her bowels well, staying well hydrated, and denies fevers, chills, night sweats, leg cramps, noticing any new lumps or bumps, pain along the spine, problems passing urine, leg swelling, abdominal pains, and any other symptoms.  MEDICAL HISTORY:  Past Medical History:  Diagnosis Date  . Anemia    h/o iron deficiency secondary to heavy menses  . Arthritis    left hand index finger  . Diffuse large B cell lymphoma ([place])   . Grief reaction 01/27/2016  . H/O measles    as a child  . History of chicken pox    as a child  . History of PCOS   . Hyperlipidemia, mixed 01/27/2016  . Lymphadenopathy   . 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 Preventative health care 01/27/2016  . Vitamin D deficiency   . Wears glasses     SURGICAL  HISTORY: Past Surgical History:  Procedure Laterality Date  . COLONOSCOPY    . IR GENERIC HISTORICAL  05/21/2016   IR FLUORO GUIDE PORT INSERTION RIGHT 05/21/2016 Arne Cleveland, MD [facility]  . IR GENERIC HISTORICAL  05/21/2016   IR US GUIDE VASC ACCESS RIGHT 05/21/2016 Arne Cleveland, MD [facility]  . IR REMOVAL TUN ACCESS W/ PORT W/O FL MOD SED  10/27/2016  . SKIN BIOPSY Left    face  . THYROGLOSSAL DUCT CYST N/A 04/16/2016   Procedure: THYROGLOSSAL DUCT CYST excision;  Surgeon: [name], MD;  Location: [facility];  Service: ENT;  Laterality: N/A;    SOCIAL HISTORY: Social History   Socioeconomic History  . Marital status: Widowed    Spouse name: Not on file  . Number of children: Not on file  . Years of education: Not on file  . Highest education level: Not on file  Occupational History  . Occupation: Psychotherapist  Social Needs  . Financial resource strain: Not on file  . Food insecurity:    Worry: Not on file    Inability: Not on file  . Transportation needs:    Medical: Not on file    Non-medical: Not on file  Tobacco Use  . Smoking status: Never Smoker  . Smokeless tobacco: Never Used  Substance and Sexual Activity  . Alcohol use: Yes    Alcohol/week: 3.0 - 6.0 standard  drinks    Types: 3 - 6 Standard drinks or equivalent per week    Comment: 5 glasses of wine a week.  . Drug use: No  . Sexual activity: Never    Partners: Male    Birth control/protection: None  Lifestyle  . Physical activity:    Days per week: Not on file    Minutes per session: Not on file  . Stress: Not on file  Relationships  . Social connections:    Talks on phone: Not on file    Gets together: Not on file    Attends religious service: Not on file    Active member of club or organization: Not on file    Attends meetings of clubs or organizations: Not on file    Relationship status: Not on file  . Intimate partner violence:    Fear of current or ex partner: Not on file     Emotionally abused: Not on file    Physically abused: Not on file    Forced sexual activity: Not on file  Other Topics Concern  . Not on file  Social History Narrative   Married. Education: [employer]. Exercise: walking, yoga, and bicycling daily for 1-2 hours. Lives alone, works as Management consultant    FAMILY HISTORY: Family History  Problem Relation Age of Onset  . Arthritis Mother        rheumatoid  . Heart disease Father        CHF  . COPD Sister        Emphysema, h/o cigarettes  . Other Sister        pituatary tumor  . Arthritis Sister   . Obesity Sister   . Cancer Paternal Grandfather        cancer  . Stroke Maternal Aunt     ALLERGIES:  is allergic to erythromycin and other.  MEDICATIONS:  Current Outpatient Medications  Medication Sig Dispense Refill  . Calcium-Magnesium-Vitamin D (CALCIUM MAGNESIUM PO) Take 1 tablet by mouth daily.    . Cholecalciferol (VITAMIN D) 2000 units CAPS Take 2,000 Units by mouth daily.    [name] dexamethasone (DECADRON) 4 MG tablet TAKE 3 TABLETS BY MOUTH TWICE DAILY, TAKE 1 DOSE IN THE MORNING AND 1 DOSE IN THE EVENING THE DAY PRIOR TO Rituxan 12 tablet 6  . Omega-3 Fatty Acids (OMEGA-3 PO) Take 2 capsules by mouth daily. Omega 3 1280 mg    . Vitamin D, Ergocalciferol, (DRISDOL) 1.25 MG (50000 UT) CAPS capsule TAKE 1 CAPSULE BY MOUTH EVERY 7 DAYS 4 capsule 0   No current facility-administered medications for this visit.     REVIEW OF SYSTEMS:    A 10+ POINT REVIEW OF SYSTEMS WAS OBTAINED including neurology, dermatology, psychiatry, cardiac, respiratory, lymph, extremities, GI, GU, Musculoskeletal, constitutional, breasts, reproductive, HEENT.  All pertinent positives are noted in the HPI.  All others are negative.   PHYSICAL EXAMINATION:  ECOG PERFORMANCE STATUS: 0 - Asymptomatic  Vitals:   03/23/18 1142  BP: (!) 131/58  Pulse: 65  Resp: 18  Temp: 98.1 F (36.7 C)  SpO2: 100%   Filed Weights   03/23/18 1142  Weight: 168 lb 3.2 oz  (76.3 kg)   .Body mass index is 24.84 kg/m.  GENERAL:alert, in no acute distress and comfortable SKIN: no acute rashes, no significant lesions EYES: conjunctiva are pink and non-injected, sclera anicteric OROPHARYNX: MMM, no exudates, no oropharyngeal erythema or ulceration NECK: supple, no JVD LYMPH:  no palpable lymphadenopathy in the cervical, axillary or  inguinal regions LUNGS: clear to auscultation b/l with normal respiratory effort HEART: regular rate & rhythm ABDOMEN:  normoactive bowel sounds , non tender, not distended. No palpable hepatosplenomegaly.  Extremity: no pedal edema PSYCH: alert & oriented x 3 with fluent speech NEURO: no focal motor/sensory deficits   LABORATORY DATA:  I have reviewed the data as listed  . CBC Latest Ref Rng & Units 03/23/2018 01/09/2018 11/07/2017  WBC 4.0 - 10.5 K/uL 9.5 9.6 11.6(H)  Hemoglobin 12.0 - 15.0 g/dL 13.0 13.2 13.0  Hematocrit 36.0 - 46.0 % 38.2 39.0 37.6  Platelets 150 - 400 K/uL 292 276 253   CBC    Component Value Date/Time   WBC 9.5 03/23/2018 1002   RBC 4.17 03/23/2018 1002   HGB 13.0 03/23/2018 1002   HGB 13.2 07/11/2017 0757   HGB 13.3 03/07/2017 0842   HCT 38.2 03/23/2018 1002   HCT 39.1 03/07/2017 0842   PLT 292 03/23/2018 1002   PLT 291 07/11/2017 0757   PLT 283 03/07/2017 0842   MCV 91.6 03/23/2018 1002   MCV 91.4 03/07/2017 0842   MCH 31.2 03/23/2018 1002   MCHC 34.0 03/23/2018 1002   RDW 12.2 03/23/2018 1002   RDW 13.3 03/07/2017 0842   LYMPHSABS 0.4 (L) 03/23/2018 1002   LYMPHSABS 0.4 (L) 03/07/2017 0842   MONOABS 0.5 03/23/2018 1002   MONOABS 0.7 03/07/2017 0842   EOSABS 0.0 03/23/2018 1002   EOSABS 0.0 03/07/2017 0842   BASOSABS 0.0 03/23/2018 1002   BASOSABS 0.0 03/07/2017 0842    . CMP Latest Ref Rng & Units 03/23/2018 01/09/2018 11/07/2017  Glucose 70 - 99 mg/dL 135(H) 121(H) 128(H)  BUN 8 - 23 mg/dL _0 Creatinine 0.44 - 1.00 mg/dL 0.79 0.75 0.81  Sodium 135 - 145 mmol/L 140 141 138    Potassium 3.5 - 5.1 mmol/L 4.1 4.0 4.1  Chloride 98 - 111 mmol/L 106 107 104  CO2 22 - 32 mmol/L _1 Calcium 8.9 - 10.3 mg/dL 9.9 10.1 10.0  Total Protein 6.5 - 8.1 g/dL 6.9 6.6 7.0  Total Bilirubin 0.3 - 1.2 mg/dL 0.8 0.6 0.5  Alkaline Phos 38 - 126 U/L 111 106 137(H)  AST 15 - 41 U/L 16 11(L) 19  ALT 0 - 44 U/L _2 Component     Latest Ref Rng & Units 05/07/2016  Hepatitis B Surface Ag     Negative Negative  Hep B Core Ab, Tot     Negative Negative  Hep C Virus Ab     0.0 - 0.9 s/co ratio <0.1   . Lab Results  Component Value Date   LDH 203 (H) 03/23/2018         RADIOGRAPHIC STUDIES:   ASSESSMENT & PLAN:   72 y.o. 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  Initial PET/CT showed Scattered small mildly hypermetabolic lymph nodes within the neck, left hilum, left axilla and left groin, potentially related the patient's lymphoma. Focal hypermetabolic activity within the left sternal manubrium and left iliac bone, also potentially related to the patient's lymphoma. No evidence of solid visceral organ involvement.  CT guided bone marrow biopsy done -- no evidence of lymphoma involving Bone marrow. Normal LDH. No constitutional symptoms. Patient has no significant medical comorbidities at baseline. ECHO with nl EF as expected.  Patient has completed 6 cycles of R CHOP  PET/CT s/p 6 cycles of R-CHOP shows complete metabolic response CT chest/abd/pelvis 07/07/2017 - showed no radipgraphic evidence of lymphoma progression at this time.  Lab Results  Component Value Date   LDH 203 (H) 03/23/2018   2) Rituxan hypersensitivity (mild grade 1 hives on the back ) - resolved with solumedrol and antihistamines. No issues with dexamethasone pretreatment -Patient will continue dexamethasone premedication to reduce risk of Rituxan hypersensitivity.  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 She is up to date on her vaccinations.  -Will do repeat CT scans in march 2020, earlier if concerning symptoms present.  -Discussed pt labwork today, 03/23/18; blood counts and chemistries are stable  -Recommend 50k units Vitamin D three times a week -The pt has no prohibitive toxicities from continuing maintenance Rituxan with dexamethasone pre-medication every 2 months at this time.   -Will see the pt back in 2 months    Please schedule next 3 cycles of Rituxan maintenance q60days as per orders with labs and MD visit with each cycle. Dates for Rituxan will need to be adjustment based on current orders since current Rituxan was delayed.  . Orders Placed This Encounter  Procedures  . CBC with Differential/Platelet    Standing Status:   Standing    Number of Occurrences:   3    Standing Expiration Date:   03/24/2019  . CMP ([place] only)    Standing Status:   Standing    Number of Occurrences:   3    Standing Expiration Date:   03/24/2019  . Lactate dehydrogenase    Standing Status:   Standing    Number of Occurrences:   3    Standing Expiration Date:   03/24/2019    All of the patients questions were answered with apparent satisfaction. The patient knows to call the clinic with any problems, questions or concerns.  The total time spent in the appt was 25 minutes and more than 50% was on counseling and direct patient cares.   Sullivan Lone MD [place] AAHIVMS [facility] Hematology/Oncology Physician [facility]  (Office):       [phone] (Work cell):  [phone] (Fax):           [phone]  I, Baldwin [place], am acting as a scribe for Dr. Sullivan Lone.   .I have reviewed the above documentation for accuracy and completeness, and I agree  with the above. Brunetta Genera MD

## 2018-03-23 ENCOUNTER — Encounter: Payer: Self-pay | Admitting: Hematology

## 2018-03-23 ENCOUNTER — Telehealth: Payer: Self-pay | Admitting: Hematology

## 2018-03-23 ENCOUNTER — Inpatient Hospital Stay: Payer: Medicare Other | Attending: Hematology

## 2018-03-23 ENCOUNTER — Inpatient Hospital Stay (HOSPITAL_BASED_OUTPATIENT_CLINIC_OR_DEPARTMENT_OTHER): Payer: Medicare Other | Admitting: Hematology

## 2018-03-23 ENCOUNTER — Inpatient Hospital Stay: Payer: Medicare Other

## 2018-03-23 VITALS — BP 124/50 | HR 57 | Temp 98.9°F | Resp 17

## 2018-03-23 VITALS — BP 131/58 | HR 65 | Temp 98.1°F | Resp 18 | Ht 69.0 in | Wt 168.2 lb

## 2018-03-23 DIAGNOSIS — Z5112 Encounter for antineoplastic immunotherapy: Secondary | ICD-10-CM | POA: Insufficient documentation

## 2018-03-23 DIAGNOSIS — C8298 Follicular lymphoma, unspecified, lymph nodes of multiple sites: Secondary | ICD-10-CM

## 2018-03-23 DIAGNOSIS — G620 Drug-induced polyneuropathy: Secondary | ICD-10-CM | POA: Diagnosis not present

## 2018-03-23 DIAGNOSIS — C8330 Diffuse large B-cell lymphoma, unspecified site: Secondary | ICD-10-CM

## 2018-03-23 DIAGNOSIS — Z8249 Family history of ischemic heart disease and other diseases of the circulatory system: Secondary | ICD-10-CM

## 2018-03-23 DIAGNOSIS — Z79899 Other long term (current) drug therapy: Secondary | ICD-10-CM

## 2018-03-23 DIAGNOSIS — Z7189 Other specified counseling: Secondary | ICD-10-CM

## 2018-03-23 LAB — CBC WITH DIFFERENTIAL/PLATELET
Abs Immature Granulocytes: 0.01 10*3/uL (ref 0.00–0.07)
Basophils Absolute: 0 10*3/uL (ref 0.0–0.1)
Basophils Relative: 0 %
Eosinophils Absolute: 0 10*3/uL (ref 0.0–0.5)
Eosinophils Relative: 0 %
HCT: 38.2 % (ref 36.0–46.0)
Hemoglobin: 13 g/dL (ref 12.0–15.0)
Immature Granulocytes: 0 %
Lymphocytes Relative: 4 %
Lymphs Abs: 0.4 10*3/uL — ABNORMAL LOW (ref 0.7–4.0)
MCH: 31.2 pg (ref 26.0–34.0)
MCHC: 34 g/dL (ref 30.0–36.0)
MCV: 91.6 fL (ref 80.0–100.0)
Monocytes Absolute: 0.5 10*3/uL (ref 0.1–1.0)
Monocytes Relative: 5 %
Neutro Abs: 8.6 10*3/uL — ABNORMAL HIGH (ref 1.7–7.7)
Neutrophils Relative %: 91 %
Platelets: 292 10*3/uL (ref 150–400)
RBC: 4.17 MIL/uL (ref 3.87–5.11)
RDW: 12.2 % (ref 11.5–15.5)
WBC: 9.5 10*3/uL (ref 4.0–10.5)
nRBC: 0 % (ref 0.0–0.2)

## 2018-03-23 LAB — CMP (CANCER CENTER ONLY)
ALT: 20 U/L (ref 0–44)
AST: 16 U/L (ref 15–41)
Albumin: 4.2 g/dL (ref 3.5–5.0)
Alkaline Phosphatase: 111 U/L (ref 38–126)
Anion gap: 12 (ref 5–15)
BUN: 17 mg/dL (ref 8–23)
CO2: 22 mmol/L (ref 22–32)
Calcium: 9.9 mg/dL (ref 8.9–10.3)
Chloride: 106 mmol/L (ref 98–111)
Creatinine: 0.79 mg/dL (ref 0.44–1.00)
GFR, Est AFR Am: >60 mL/min (ref >60)
GFR, Estimated: >60 mL/min (ref >60)
Glucose, Bld: 135 mg/dL — ABNORMAL HIGH (ref 70–99)
Potassium: 4.1 mmol/L (ref 3.5–5.1)
Sodium: 140 mmol/L (ref 135–145)
Total Bilirubin: 0.8 mg/dL (ref 0.3–1.2)
Total Protein: 6.9 g/dL (ref 6.5–8.1)

## 2018-03-23 LAB — LACTATE DEHYDROGENASE: LDH: 203 U/L — ABNORMAL HIGH (ref 98–192)

## 2018-03-23 MED ORDER — ACETAMINOPHEN 325 MG PO TABS
650.0000 mg | ORAL_TABLET | Freq: Once | ORAL | Status: AC
  Administered 2018-03-23: 650 mg via ORAL

## 2018-03-23 MED ORDER — SODIUM CHLORIDE 0.9 % IV SOLN
12.0000 mg | Freq: Once | INTRAVENOUS | Status: AC
  Administered 2018-03-23: 12 mg via INTRAVENOUS
  Filled 2018-03-23: qty 1.2

## 2018-03-23 MED ORDER — DIPHENHYDRAMINE HCL 25 MG PO CAPS
ORAL_CAPSULE | ORAL | Status: AC
  Filled 2018-03-23: qty 2

## 2018-03-23 MED ORDER — SODIUM CHLORIDE 0.9 % IV SOLN
375.0000 mg/m2 | Freq: Once | INTRAVENOUS | Status: AC
  Administered 2018-03-23: 700 mg via INTRAVENOUS
  Filled 2018-03-23: qty 50

## 2018-03-23 MED ORDER — SODIUM CHLORIDE 0.9 % IV SOLN
Freq: Once | INTRAVENOUS | Status: AC
  Administered 2018-03-23: 13:00:00 via INTRAVENOUS
  Filled 2018-03-23: qty 250

## 2018-03-23 MED ORDER — DIPHENHYDRAMINE HCL 25 MG PO CAPS
50.0000 mg | ORAL_CAPSULE | Freq: Once | ORAL | Status: AC
  Administered 2018-03-23: 50 mg via ORAL

## 2018-03-23 MED ORDER — ACETAMINOPHEN 325 MG PO TABS
ORAL_TABLET | ORAL | Status: AC
  Filled 2018-03-23: qty 2

## 2018-03-23 NOTE — Telephone Encounter (Signed)
Gave pt avs and calendar  °

## 2018-03-23 NOTE — Patient Instructions (Signed)
[facility] Discharge Instructions for Patients Receiving Chemotherapy  Today you received the following chemotherapy agents Rituximab (RITUXAN).  To help prevent nausea and vomiting after your treatment, we encourage you to take your nausea medication as prescribed.   If you develop nausea and vomiting that is not controlled by your nausea medication, call the clinic.   BELOW ARE SYMPTOMS THAT SHOULD BE REPORTED IMMEDIATELY:  *FEVER GREATER THAN 100.5 F  *CHILLS WITH OR WITHOUT FEVER  NAUSEA AND VOMITING THAT IS NOT CONTROLLED WITH YOUR NAUSEA MEDICATION  *UNUSUAL SHORTNESS OF BREATH  *UNUSUAL BRUISING OR BLEEDING  TENDERNESS IN MOUTH AND THROAT WITH OR WITHOUT PRESENCE OF ULCERS  *URINARY PROBLEMS  *BOWEL PROBLEMS  UNUSUAL RASH Items with * indicate a potential emergency and should be followed up as soon as possible.  Feel free to call the clinic should you have any questions or concerns. The clinic phone number is [phone].  Please show the CHEMO ALERT CARD at check-in to the Emergency Department and triage nurse.   

## 2018-03-24 ENCOUNTER — Ambulatory Visit: Payer: Medicare Other | Admitting: Hematology

## 2018-03-24 ENCOUNTER — Ambulatory Visit: Payer: Medicare Other

## 2018-03-24 ENCOUNTER — Other Ambulatory Visit: Payer: Medicare Other

## 2018-04-23 IMAGING — CT CT MAXILLOFACIAL W/O CM
3 series · 15 of 47 positions shown, 18 images · non-contrast
Comparison: None.

CLINICAL DATA: Assess for salivary gland stone. Hard knot in the
midline under the chin For 3-5 weeks.

EXAM:
CT MAXILLOFACIAL WITHOUT CONTRAST
TECHNIQUE: Multidetector CT imaging of the maxillofacial structures was
performed. Multiplanar CT image reconstructions were also generated.
A small metallic BB was placed on the right temple in order to
reliably differentiate right from left.

[Series 2: max soft · axial · 0.41mm/px · z∈[-224,-76]mm · 9 of 86 slices shown, 12 images]
[im 6/86  brain]
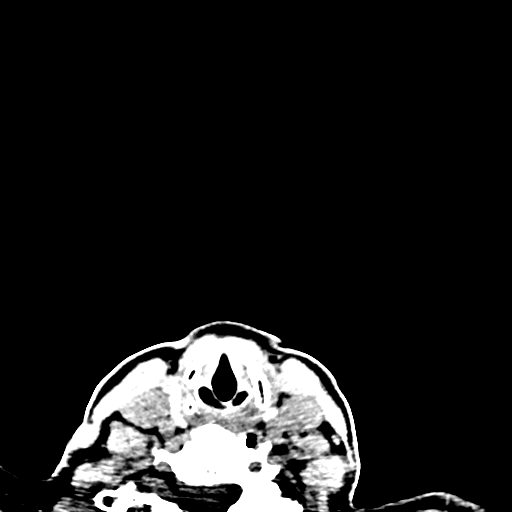
[im 6/86  bone]
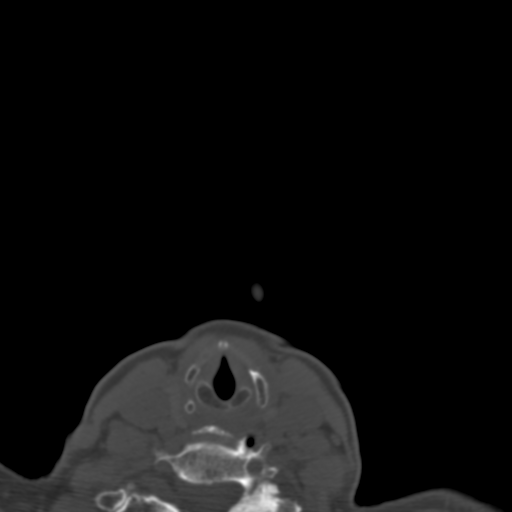
[im 15/86  bone]
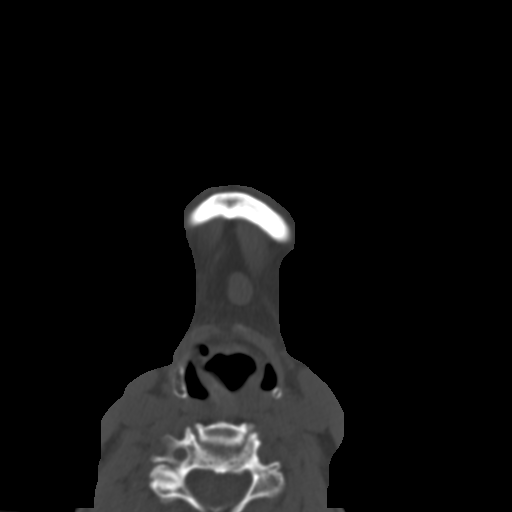
[im 24/86  bone]
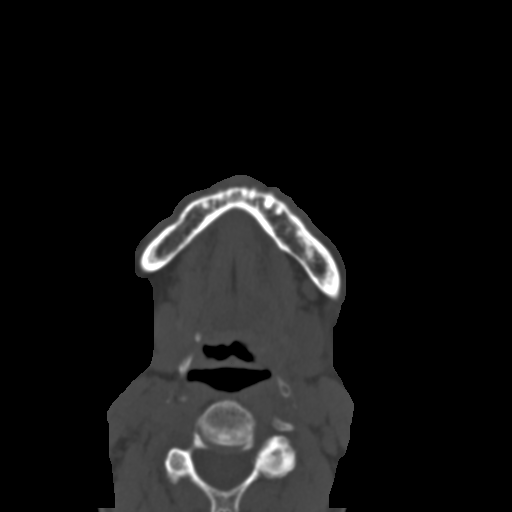
[im 33/86  bone]
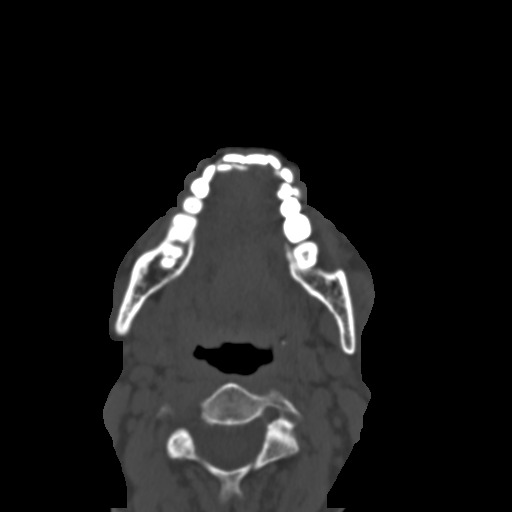
[im 44/86  brain]
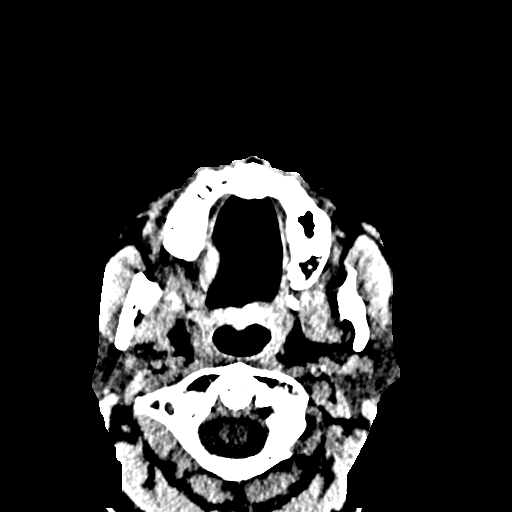
[im 44/86  bone]
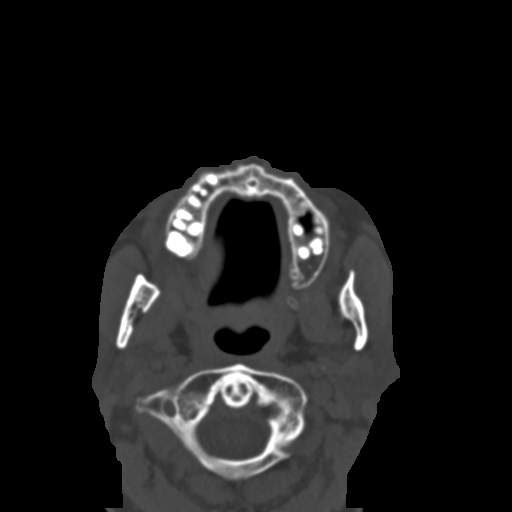
[im 53/86  bone]
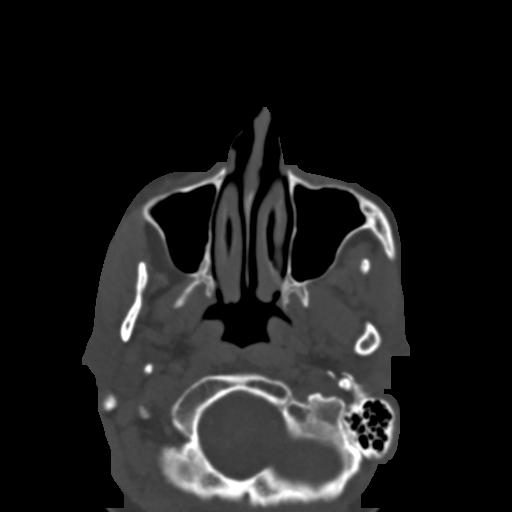
[im 62/86  bone]
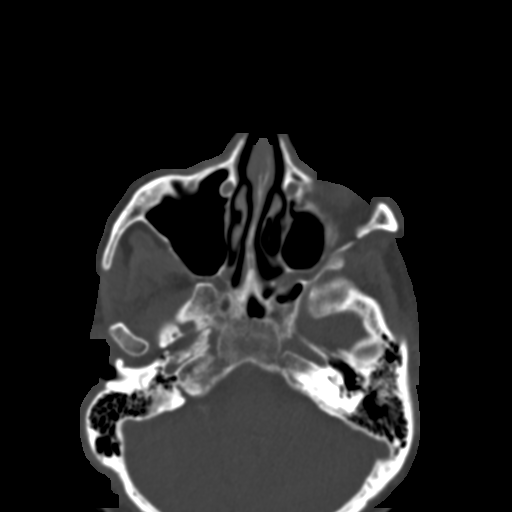
[im 71/86  bone]
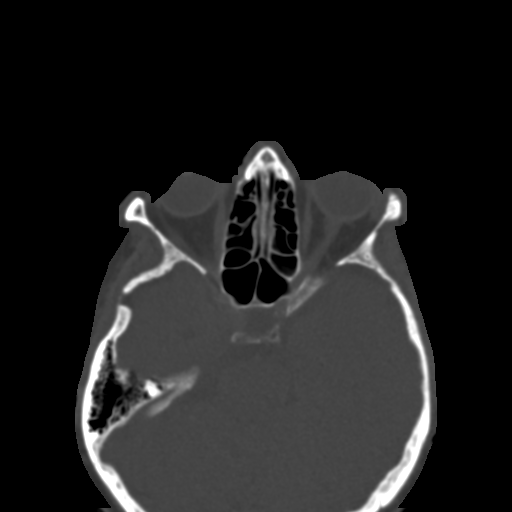
[im 80/86  brain]
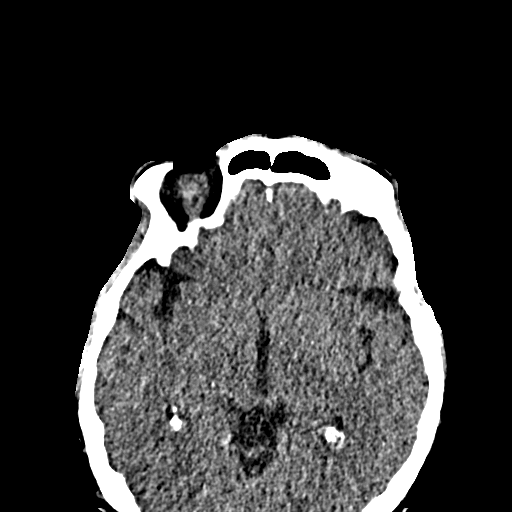
[im 80/86  bone]
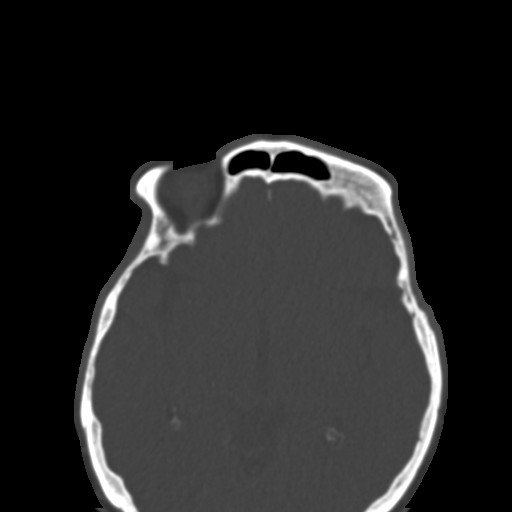

[Series 6: coronal soft · coronal · 0.39mm/px · 3 of 76 slices shown]
[im 34/76  bone]
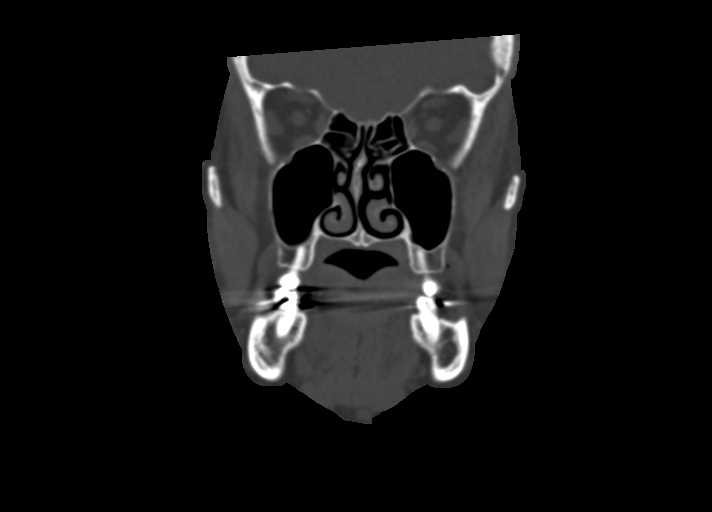
[im 42/76  bone]
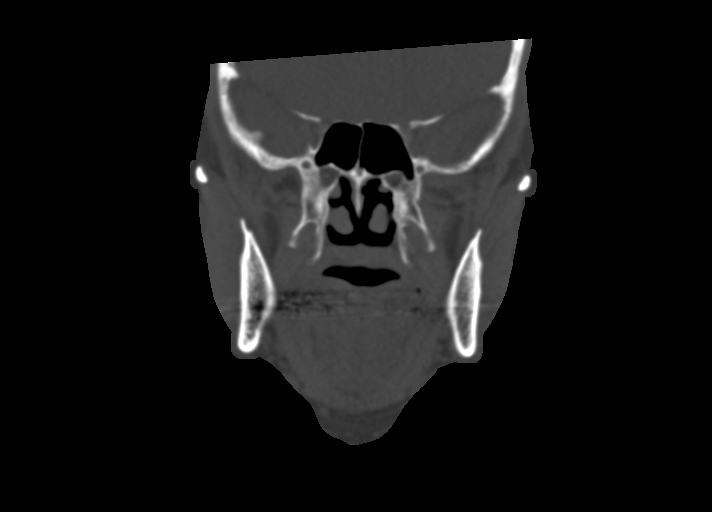
[im 51/76  bone]
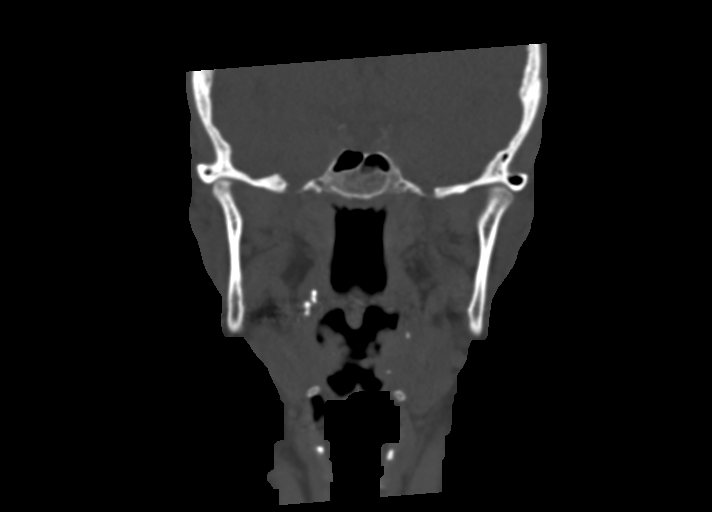

[Series 8: sagittal soft · sagittal · 0.36mm/px · 3 of 83 slices shown]
[im 28/83  bone]
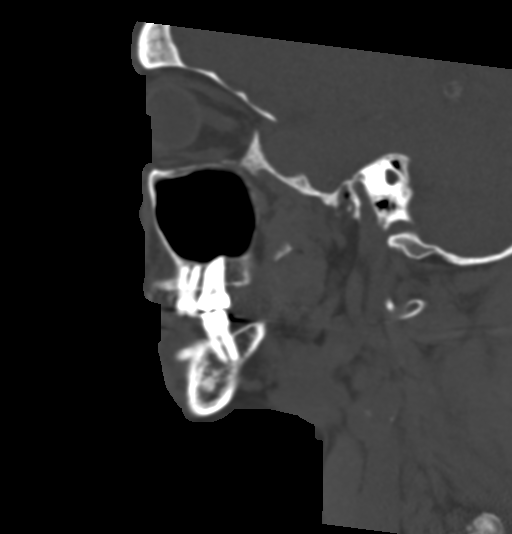
[im 42/83  bone]
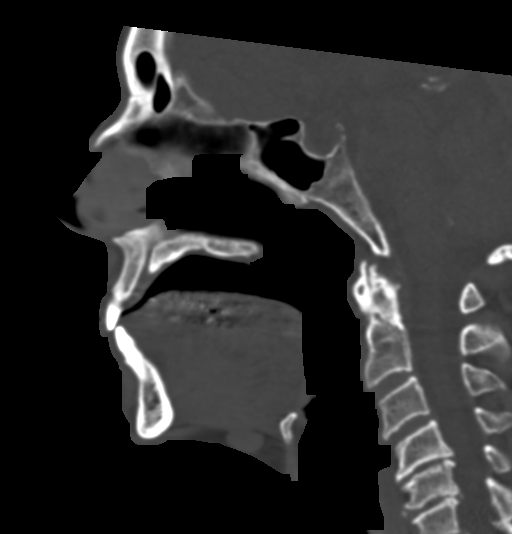
[im 55/83  bone]
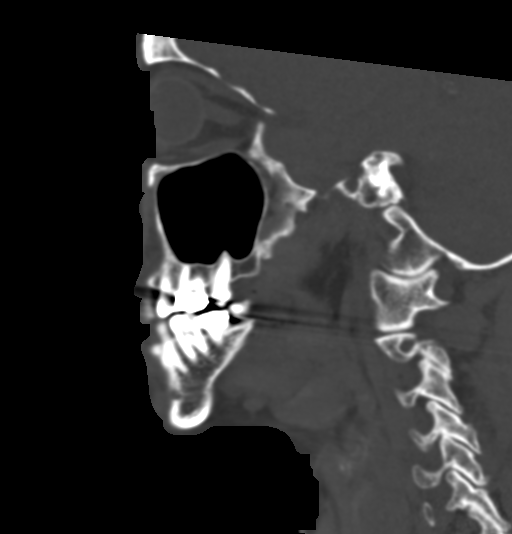

[15 of 47 positions shown; findings below may reference images not displayed]

FINDINGS: The study was not performed as a CT neck and no contrast was
administered.

The area of clinical concern appears to correlate with a 9 mm LEFT
paramedian level 1 lymph node. Both submandibular glands appear
normal, and there is no salivary duct stone. BILATERAL tonsillar
calcifications are evident. No parotid masses. Within limits for
assessment on noncontrast CT maxillofacial, no visible mucosal,
oropharyngeal, or tongue base lesion. No osseous findings. Carotid
atherosclerosis. No layering sinus fluid. Negative orbits. Negative
visualized intracranial compartment. TMJs located. No obvious
mandibular or maxillary dental pathology.
IMPRESSION: 9 mm LEFT paramedian level 1 lymph node, uncertain significance. If
the node persists or increases, tissue sampling may be warranted.

No evidence for submandibular gland mass or stone.

## 2018-05-10 ENCOUNTER — Inpatient Hospital Stay (HOSPITAL_BASED_OUTPATIENT_CLINIC_OR_DEPARTMENT_OTHER): Payer: Medicare Other | Admitting: Medical

## 2018-05-10 ENCOUNTER — Inpatient Hospital Stay: Payer: Medicare Other | Attending: Hematology | Admitting: Medical

## 2018-05-10 ENCOUNTER — Telehealth: Payer: Self-pay

## 2018-05-10 ENCOUNTER — Other Ambulatory Visit: Payer: Self-pay

## 2018-05-10 ENCOUNTER — Encounter: Payer: Self-pay | Admitting: Medical

## 2018-05-10 VITALS — BP 133/80 | HR 71 | Temp 98.9°F | Resp 16 | Wt 169.1 lb

## 2018-05-10 DIAGNOSIS — N390 Urinary tract infection, site not specified: Secondary | ICD-10-CM

## 2018-05-10 DIAGNOSIS — C8330 Diffuse large B-cell lymphoma, unspecified site: Secondary | ICD-10-CM | POA: Diagnosis not present

## 2018-05-10 DIAGNOSIS — N39498 Other specified urinary incontinence: Secondary | ICD-10-CM

## 2018-05-10 DIAGNOSIS — Z8249 Family history of ischemic heart disease and other diseases of the circulatory system: Secondary | ICD-10-CM | POA: Insufficient documentation

## 2018-05-10 DIAGNOSIS — R32 Unspecified urinary incontinence: Secondary | ICD-10-CM | POA: Insufficient documentation

## 2018-05-10 DIAGNOSIS — E559 Vitamin D deficiency, unspecified: Secondary | ICD-10-CM | POA: Diagnosis not present

## 2018-05-10 DIAGNOSIS — Z5112 Encounter for antineoplastic immunotherapy: Secondary | ICD-10-CM | POA: Insufficient documentation

## 2018-05-10 DIAGNOSIS — C8298 Follicular lymphoma, unspecified, lymph nodes of multiple sites: Secondary | ICD-10-CM

## 2018-05-10 DIAGNOSIS — G620 Drug-induced polyneuropathy: Secondary | ICD-10-CM | POA: Insufficient documentation

## 2018-05-10 LAB — URINALYSIS, COMPLETE (UACMP) WITH MICROSCOPIC
Bacteria, UA: NONE SEEN
Bilirubin Urine: NEGATIVE
Glucose, UA: NEGATIVE mg/dL
Hgb urine dipstick: NEGATIVE
Ketones, ur: NEGATIVE mg/dL
Leukocytes, UA: NEGATIVE
Nitrite: NEGATIVE
Protein, ur: NEGATIVE mg/dL
Specific Gravity, Urine: 1.005 (ref 1.005–1.030)
pH: 6 (ref 5.0–8.0)

## 2018-05-10 LAB — CBC WITH DIFFERENTIAL (CANCER CENTER ONLY)
Abs Immature Granulocytes: 0.01 10*3/uL (ref 0.00–0.07)
Basophils Absolute: 0 10*3/uL (ref 0.0–0.1)
Basophils Relative: 1 %
Eosinophils Absolute: 0.2 10*3/uL (ref 0.0–0.5)
Eosinophils Relative: 4 %
HCT: 41.5 % (ref 36.0–46.0)
Hemoglobin: 13.9 g/dL (ref 12.0–15.0)
Immature Granulocytes: 0 %
Lymphocytes Relative: 26 %
Lymphs Abs: 1 10*3/uL (ref 0.7–4.0)
MCH: 30.8 pg (ref 26.0–34.0)
MCHC: 33.5 g/dL (ref 30.0–36.0)
MCV: 91.8 fL (ref 80.0–100.0)
Monocytes Absolute: 0.5 10*3/uL (ref 0.1–1.0)
Monocytes Relative: 13 %
Neutro Abs: 2.3 10*3/uL (ref 1.7–7.7)
Neutrophils Relative %: 56 %
Platelet Count: 272 10*3/uL (ref 150–400)
RBC: 4.52 MIL/uL (ref 3.87–5.11)
RDW: 12.4 % (ref 11.5–15.5)
WBC Count: 4 10*3/uL (ref 4.0–10.5)
nRBC: 0 % (ref 0.0–0.2)

## 2018-05-10 LAB — CMP (CANCER CENTER ONLY)
ALT: 15 U/L (ref 0–44)
AST: 14 U/L — ABNORMAL LOW (ref 15–41)
Albumin: 4 g/dL (ref 3.5–5.0)
Alkaline Phosphatase: 101 U/L (ref 38–126)
Anion gap: 9 (ref 5–15)
BUN: 15 mg/dL (ref 8–23)
CO2: 26 mmol/L (ref 22–32)
Calcium: 9.5 mg/dL (ref 8.9–10.3)
Chloride: 104 mmol/L (ref 98–111)
Creatinine: 0.76 mg/dL (ref 0.44–1.00)
GFR, Est AFR Am: >60 mL/min (ref >60)
GFR, Estimated: >60 mL/min (ref >60)
Glucose, Bld: 98 mg/dL (ref 70–99)
Potassium: 4.3 mmol/L (ref 3.5–5.1)
Sodium: 139 mmol/L (ref 135–145)
Total Bilirubin: 0.6 mg/dL (ref 0.3–1.2)
Total Protein: 6.5 g/dL (ref 6.5–8.1)

## 2018-05-10 MED ORDER — ESTROGENS, CONJUGATED 0.625 MG/GM VA CREA: 1.0000 | TOPICAL_CREAM | Freq: Every day | VAGINAL | 3 refills | Status: DC

## 2018-05-10 MED FILL — PREMARIN VAGINAL CREAM-APPL: 0.625 | 15 days supply | Qty: 30 | Fill #0

## 2018-05-10 MED FILL — DEXAMETHASONE 4 MG TABLET: 4 | 2 days supply | Qty: 12 | Fill #0

## 2018-05-10 NOTE — Telephone Encounter (Signed)
Pt. Called today stated she has been having urgency and frequent urination with burning feeling on outer area of her vagina for a few weeks. Pt. Stated she thought symptoms would go away but they haven't appointment made for lab and Sandi Mealy PA for today.

## 2018-05-11 LAB — URINE CULTURE: Culture: NO GROWTH

## 2018-05-11 NOTE — Progress Notes (Signed)
These results were called to [name] and were reviewed with her . Her were answered. She expressed understanding.

## 2018-05-12 NOTE — Progress Notes (Signed)
Symptoms Management Clinic Progress Note   Lindsay Harrison [id number] [birth year] 73 y.o.  Lindsay Harrison is managed by Dr. 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  She is scheduled for a follow up on 05/22/18.  2) Urinary leakage: A urinalysis returned negative.  The pt was instructed on Kegel exercises.  She was also given a prescription for Premarin vaginal cream as there is likely some component of vaginal atrophy.    Please see After Visit Summary for patient specific instructions.  Future Appointments  Date Time Provider [place]  05/22/2018  8:30 AM [facility] ONLY [facility] None  05/22/2018  9:00 AM Brunetta Genera, MD [facility] None  05/22/2018 10:00 AM [facility] INFUSION [facility] None  05/29/2018  8:00 AM [name], MD [facility]  07/24/2018  8:30 AM [facility] ONLY [facility] None  07/24/2018  9:00 AM Brunetta Genera, MD [facility] None  07/24/2018 10:00 AM [facility] INFUSION [facility] None  09/18/2018  8:30 AM [facility] ONLY [facility] None  09/18/2018  9:00 AM Brunetta Genera, MD [facility] None  09/18/2018 10:00 AM [facility] INFUSION [facility] None  02/15/2019  1:00 PM Britt, [place] A, RN [facility]    No orders of the defined types were placed in this encounter.      Subjective:   Patient ID:  Lindsay Harrison is a 73 y.o. (DOB August 15, 1945) female.  Chief Complaint: No chief complaint on file.   HPI Lindsay Harrison is a 73 y.o. female with a diagnosis of follicular lymphoma who is managed by Dr. Irene Limbo.  She was last  treated with maintenance Rituxan on 03/04/18.  She presents today with a repeat of urinary leakage and vaginal dryness and mild irritation.  She has no discharge.  She has leakage when active.  She plans to participate in a 200 mile walk in [place] in the spring.  Denies fever, chills, or sweats.  Medications: I have reviewed the patient's current medications.  Allergies:  Allergies  Allergen Reactions  . Erythromycin Other (See Comments)    Patient states that she passed out while using this medication ? SYNCOPE ?  [name] Other Anaphylaxis    # # # CATS # # #    Past Medical History:  Diagnosis Date  . Anemia    h/o iron deficiency secondary to heavy menses  . Arthritis    left hand index finger  . Diffuse large B cell lymphoma ([place])   . Grief reaction 01/27/2016  . H/O measles    as a child  . History of chicken pox    as a child  . History of PCOS   . Hyperlipidemia, mixed 01/27/2016  . Lymphadenopathy   . 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 Preventative health [place] 01/27/2016  . Vitamin D deficiency   . Wears glasses     Past Surgical History:  Procedure Laterality Date  . COLONOSCOPY    . IR GENERIC HISTORICAL  05/21/2016   IR FLUORO GUIDE PORT INSERTION RIGHT 05/21/2016 Arne Cleveland, MD [facility]  . IR GENERIC HISTORICAL  05/21/2016   IR US GUIDE VASC ACCESS RIGHT 05/21/2016 Arne Cleveland, MD [facility]  .  IR REMOVAL TUN ACCESS W/ PORT W/O FL MOD SED  10/27/2016  . SKIN BIOPSY Left    face  . THYROGLOSSAL DUCT CYST N/A 04/16/2016   Procedure: THYROGLOSSAL DUCT CYST excision;  Surgeon: [name], MD;  Location[facility] OR;  Service: ENT;  Laterality: N/A;    Family History  Problem Relation Age of Onset  . Arthritis Mother        rheumatoid  . Heart disease Father        CHF  . COPD Sister        Emphysema, h/o cigarettes  . Other Sister        pituatary tumor  . Arthritis Sister   . Obesity Sister   . Cancer Paternal Grandfather        cancer  .  Stroke Maternal Aunt     Social History   Socioeconomic History  . Marital status: Widowed    Spouse name: Not on file  . Number of children: Not on file  . Years of education: Not on file  . Highest education level: Not on file  Occupational History  . Occupation: Psychotherapist  Social Needs  . Financial resource strain: Not on file  . Food insecurity:    Worry: Not on file    Inability: Not on file  . Transportation needs:    Medical: Not on file    Non-medical: Not on file  Tobacco Use  . Smoking status: Never Smoker  . Smokeless tobacco: Never Used  Substance and Sexual Activity  . Alcohol use: Yes    Alcohol/week: 3.0 - 6.0 standard drinks    Types: 3 - 6 Standard drinks or equivalent per week    Comment: 5 glasses of wine a week.  . Drug use: No  . Sexual activity: Never    Partners: Male    Birth control/protection: None  Lifestyle  . Physical activity:    Days per week: Not on file    Minutes per session: Not on file  . Stress: Not on file  Relationships  . Social connections:    Talks on phone: Not on file    Gets together: Not on file    Attends religious service: Not on file    Active member of club or organization: Not on file    Attends meetings of clubs or organizations: Not on file    Relationship status: Not on file  . Intimate partner violence:    Fear of current or ex partner: Not on file    Emotionally abused: Not on file    Physically abused: Not on file    Forced sexual activity: Not on file  Other Topics Concern  . Not on file  Social History Narrative   Married. Education: [employer]. Exercise: walking, yoga, and bicycling daily for 1-2 hours. Lives alone, works as Management consultant    Past Medical History, Surgical history, Social history, and Family history were reviewed and updated as appropriate.   Please see review of systems for further details on the patient's review from today.   Review of Systems:  Review of Systems    Constitutional: Negative for chills, diaphoresis and fever.  Respiratory: Negative for cough and shortness of breath.   Cardiovascular: Negative for chest pain, palpitations and leg swelling.  Gastrointestinal: Negative for abdominal pain, constipation, diarrhea, nausea and vomiting.  Genitourinary: Positive for vaginal pain. Negative for difficulty urinating, dysuria, flank pain, frequency, hematuria and urgency.       Leakage of urine  Skin: Negative for rash.  Neurological: Negative for dizziness and headaches.    Objective:   Physical Exam:  BP 133/80 (BP Location: Left Arm, Patient Position: Sitting)   Pulse 71   Temp 98.9 F (37.2 C) (Oral)   Resp 16   Wt 169 lb 2 oz (76.7 kg)   SpO2 100%   BMI 24.98 kg/m  ECOG: 0  Physical Exam Constitutional:      General: She is not in acute distress.    Appearance: Normal appearance. She is not ill-appearing.  HENT:     Head: Normocephalic and atraumatic.  Neurological:     General: No focal deficit present.     Mental Status: She is alert.     Gait: Gait normal.  Psychiatric:        Mood and Affect: Mood normal.        Behavior: Behavior normal.        Thought Content: Thought content normal.        Judgment: Judgment normal.     Lab Review:     Component Value Date/Time   NA 139 05/10/2018 0946   NA 137 03/07/2017 0842   K 4.3 05/10/2018 0946   K 4.3 03/07/2017 0842   CL 104 05/10/2018 0946   CO2 26 05/10/2018 0946   CO2 21 (L) 03/07/2017 0842   GLUCOSE 98 05/10/2018 0946   GLUCOSE 123 03/07/2017 0842   BUN 15 05/10/2018 0946   BUN 14.0 03/07/2017 0842   CREATININE 0.76 05/10/2018 0946   CREATININE 0.7 03/07/2017 0842   CALCIUM 9.5 05/10/2018 0946   CALCIUM 10.2 03/07/2017 0842   PROT 6.5 05/10/2018 0946   PROT 7.2 03/07/2017 0842   ALBUMIN 4.0 05/10/2018 0946   ALBUMIN 4.3 03/07/2017 0842   AST 14 (L) 05/10/2018 0946   AST 15 03/07/2017 0842   ALT 15 05/10/2018 0946   ALT 25 03/07/2017 0842   ALKPHOS  101 05/10/2018 0946   ALKPHOS 131 03/07/2017 0842   BILITOT 0.6 05/10/2018 0946   BILITOT 0.45 03/07/2017 0842   GFRNONAA >60 05/10/2018 0946   GFRAA >60 05/10/2018 0946       Component Value Date/Time   WBC 4.0 05/10/2018 0946   WBC 9.5 03/23/2018 1002   RBC 4.52 05/10/2018 0946   HGB 13.9 05/10/2018 0946   HGB 13.3 03/07/2017 0842   HCT 41.5 05/10/2018 0946   HCT 39.1 03/07/2017 0842   PLT 272 05/10/2018 0946   PLT 283 03/07/2017 0842   MCV 91.8 05/10/2018 0946   MCV 91.4 03/07/2017 0842   MCH 30.8 05/10/2018 0946   MCHC 33.5 05/10/2018 0946   RDW 12.4 05/10/2018 0946   RDW 13.3 03/07/2017 0842   LYMPHSABS 1.0 05/10/2018 0946   LYMPHSABS 0.4 (L) 03/07/2017 0842   MONOABS 0.5 05/10/2018 0946   MONOABS 0.7 03/07/2017 0842   EOSABS 0.2 05/10/2018 0946   EOSABS 0.0 03/07/2017 0842   BASOSABS 0.0 05/10/2018 0946   BASOSABS 0.0 03/07/2017 0842   -------------------------------  Imaging from last 24 hours (if applicable):  Radiology interpretation: No results found.

## 2018-05-15 ENCOUNTER — Other Ambulatory Visit: Payer: Medicare Other

## 2018-05-15 ENCOUNTER — Ambulatory Visit: Payer: Medicare Other

## 2018-05-15 ENCOUNTER — Ambulatory Visit: Payer: Medicare Other | Admitting: Hematology

## 2018-05-19 NOTE — Progress Notes (Signed)
[name]     HEMATOLOGY/ONCOLOGY CLINIC NOTE  Date of Service:  05/22/18     Patient Care Team: [name], MD as PCP - General (Family Medicine) [name] M.D. (ENT)  CHIEF COMPLAINTS/PURPOSE OF CONSULTATION:   F/u for follicular lymphoma/DLBCL  HISTORY OF PRESENTING ILLNESS:   Please see previous note for details of initial presentation.  INTERVAL HISTORY   [name] is here for follow-up of her follicular lymphoma with large cell transformation. She has completed 6 cycles of R CHOP and is currently in complete remission. She presents today for her tenth round of maintenance Rituxan. The patient's last visit with [place] was on 03/23/18. The pt reports that she is doing well overall.   The pt reports that she is eagerly anticipating walking the [facility] trail beginning in mid May. She has begun steadily increasing her mild walked per week at this time, as part of her training. She endorses good energy levels overall as well. The pt denies any fevers, chills, or night sweats at this time.   Lab results today (05/22/18) of CBC w/diff and CMP is as follows: all values are WNL except for WBC at 11.4k, ANC at 10.3k, Lymphs abs at 400,, Glucose at 110, AST at 12. 05/22/18 LDH is at 190  On review of systems, pt reports good energy levels, increasing activity, and denies mouth sores, abdominal pains, fevers, chills, night sweats, lower abdominal pains, abdominal pains, leg swelling, and any other symptoms.   MEDICAL HISTORY:  Past Medical History:  Diagnosis Date  . Anemia    h/o iron deficiency secondary to heavy menses  . Arthritis    left hand index finger  . Diffuse large B cell lymphoma ([place])   . Grief reaction 01/27/2016  . H/O measles    as a child  . History of chicken pox    as a child  . History of PCOS   . Hyperlipidemia, mixed 01/27/2016  . Lymphadenopathy   . 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 Preventative health care 01/27/2016  . Vitamin D deficiency   . Wears  glasses     SURGICAL HISTORY: Past Surgical History:  Procedure Laterality Date  . COLONOSCOPY    . IR GENERIC HISTORICAL  05/21/2016   IR FLUORO GUIDE PORT INSERTION RIGHT 05/21/2016 Arne Cleveland, MD [facility]  . IR GENERIC HISTORICAL  05/21/2016   IR US GUIDE VASC ACCESS RIGHT 05/21/2016 Arne Cleveland, MD [facility]  . IR REMOVAL TUN ACCESS W/ PORT W/O FL MOD SED  10/27/2016  . SKIN BIOPSY Left    face  . THYROGLOSSAL DUCT CYST N/A 04/16/2016   Procedure: THYROGLOSSAL DUCT CYST excision;  Surgeon: [name], MD;  Location: [place];  Service: ENT;  Laterality: N/A;    SOCIAL HISTORY: Social History   Socioeconomic History  . Marital status: Widowed    Spouse name: Not on file  . Number of children: Not on file  . Years of education: Not on file  . Highest education level: Not on file  Occupational History  . Occupation: Psychotherapist  Social Needs  . Financial resource strain: Not on file  . Food insecurity:    Worry: Not on file    Inability: Not on file  . Transportation needs:    Medical: Not on file    Non-medical: Not on file  Tobacco Use  . Smoking status: Never Smoker  . Smokeless tobacco: Never Used  Substance and Sexual Activity  . Alcohol use: Yes  Alcohol/week: 3.0 - 6.0 standard drinks    Types: 3 - 6 Standard drinks or equivalent per week    Comment: 5 glasses of wine a week.  . Drug use: No  . Sexual activity: Never    Partners: Male    Birth control/protection: None  Lifestyle  . Physical activity:    Days per week: Not on file    Minutes per session: Not on file  . Stress: Not on file  Relationships  . Social connections:    Talks on phone: Not on file    Gets together: Not on file    Attends religious service: Not on file    Active member of club or organization: Not on file    Attends meetings of clubs or organizations: Not on file    Relationship status: Not on file  . Intimate partner violence:    Fear of current or ex  partner: Not on file    Emotionally abused: Not on file    Physically abused: Not on file    Forced sexual activity: Not on file  Other Topics Concern  . Not on file  Social History Narrative   Married. Education: [employer]. Exercise: walking, yoga, and bicycling daily for 1-2 hours. Lives alone, works as Management consultant    FAMILY HISTORY: Family History  Problem Relation Age of Onset  . Arthritis Mother        rheumatoid  . Heart disease Father        CHF  . COPD Sister        Emphysema, h/o cigarettes  . Other Sister        pituatary tumor  . Arthritis Sister   . Obesity Sister   . Cancer Paternal Grandfather        cancer  . Stroke Maternal Aunt     ALLERGIES:  is allergic to erythromycin and other.  MEDICATIONS:  Current Outpatient Medications  Medication Sig Dispense Refill  . Calcium-Magnesium-Vitamin D (CALCIUM MAGNESIUM PO) Take 1 tablet by mouth daily.    . Cholecalciferol (VITAMIN D) 2000 units CAPS Take 2,000 Units by mouth daily.    [name] conjugated estrogens (PREMARIN) vaginal cream Place 1 Applicatorful vaginally daily. 42.5 g 3  . dexamethasone (DECADRON) 4 MG tablet TAKE 3 TABLETS BY MOUTH TWICE DAILY, TAKE 1 DOSE IN THE MORNING AND 1 DOSE IN THE EVENING THE DAY PRIOR TO Rituxan 12 tablet 6  . Omega-3 Fatty Acids (OMEGA-3 PO) Take 2 capsules by mouth daily. Omega 3 1280 mg    . Vitamin D, Ergocalciferol, (DRISDOL) 1.25 MG (50000 UT) CAPS capsule TAKE 1 CAPSULE BY MOUTH EVERY 7 DAYS 4 capsule 0   No current facility-administered medications for this visit.     REVIEW OF SYSTEMS:    A 10+ POINT REVIEW OF SYSTEMS WAS OBTAINED including neurology, dermatology, psychiatry, cardiac, respiratory, lymph, extremities, GI, GU, Musculoskeletal, constitutional, breasts, reproductive, HEENT.  All pertinent positives are noted in the HPI.  All others are negative.   PHYSICAL EXAMINATION:  ECOG PERFORMANCE STATUS: 0 - Asymptomatic  Vitals:   05/22/18 0854  BP: 140/78    Pulse: (!) 59  Resp: 18  Temp: 98.3 F (36.8 C)  SpO2: 100%   Filed Weights   05/22/18 0854  Weight: 170 lb 9.6 oz (77.4 kg)   .Body mass index is 25.19 kg/m.  GENERAL:alert, in no acute distress and comfortable SKIN: no acute rashes, no significant lesions EYES: conjunctiva are pink and non-injected, sclera anicteric OROPHARYNX: MMM,  no exudates, no oropharyngeal erythema or ulceration NECK: supple, no JVD LYMPH:  no palpable lymphadenopathy in the cervical, axillary or inguinal regions LUNGS: clear to auscultation b/l with normal respiratory effort HEART: regular rate & rhythm ABDOMEN:  normoactive bowel sounds , non tender, not distended. No palpable hepatosplenomegaly.  Extremity: no pedal edema PSYCH: alert & oriented x 3 with fluent speech NEURO: no focal motor/sensory deficits   LABORATORY DATA:  I have reviewed the data as listed  . CBC Latest Ref Rng & Units 05/22/2018 05/10/2018 03/23/2018  WBC 4.0 - 10.5 K/uL 11.4(H) 4.0 9.5  Hemoglobin 12.0 - 15.0 g/dL 13.4 13.9 13.0  Hematocrit 36.0 - 46.0 % 39.3 41.5 38.2  Platelets 150 - 400 K/uL 286 272 292   CBC    Component Value Date/Time   WBC 11.4 (H) 05/22/2018 0822   RBC 4.32 05/22/2018 0822   HGB 13.4 05/22/2018 0822   HGB 13.9 05/10/2018 0946   HGB 13.3 03/07/2017 0842   HCT 39.3 05/22/2018 0822   HCT 39.1 03/07/2017 0842   PLT 286 05/22/2018 0822   PLT 272 05/10/2018 0946   PLT 283 03/07/2017 0842   MCV 91.0 05/22/2018 0822   MCV 91.4 03/07/2017 0842   MCH 31.0 05/22/2018 0822   MCHC 34.1 05/22/2018 0822   RDW 12.5 05/22/2018 0822   RDW 13.3 03/07/2017 0842   LYMPHSABS 0.4 (L) 05/22/2018 0822   LYMPHSABS 0.4 (L) 03/07/2017 0842   MONOABS 0.5 05/22/2018 0822   MONOABS 0.7 03/07/2017 0842   EOSABS 0.3 05/22/2018 0822   EOSABS 0.0 03/07/2017 0842   BASOSABS 0.0 05/22/2018 0822   BASOSABS 0.0 03/07/2017 0842    . CMP Latest Ref Rng & Units 05/22/2018 05/10/2018 03/23/2018  Glucose 70 - 99 mg/dL  110(H) 98 135(H)  BUN 8 - 23 mg/dL _0 Creatinine 0.44 - 1.00 mg/dL 0.70 0.76 0.79  Sodium 135 - 145 mmol/L 138 139 140  Potassium 3.5 - 5.1 mmol/L 4.4 4.3 4.1  Chloride 98 - 111 mmol/L 106 104 106  CO2 22 - 32 mmol/L _1 Calcium 8.9 - 10.3 mg/dL 10.2 9.5 9.9  Total Protein 6.5 - 8.1 g/dL 7.0 6.5 6.9  Total Bilirubin 0.3 - 1.2 mg/dL 0.5 0.6 0.8  Alkaline Phos 38 - 126 U/L 119 101 111  AST 15 - 41 U/L 12(L) 14(L) 16  ALT 0 - 44 U/L _2 Component     Latest Ref Rng & Units 05/07/2016  Hepatitis B Surface Ag     Negative Negative  Hep B Core Ab, Tot     Negative Negative  Hep C Virus Ab     0.0 - 0.9 s/co ratio <0.1   . Lab Results  Component Value Date   LDH 190 05/22/2018         RADIOGRAPHIC STUDIES:   ASSESSMENT & PLAN:   73 y.o. 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  Initial PET/CT showed Scattered small mildly hypermetabolic lymph nodes within the neck, left hilum, left axilla and left groin, potentially related the patient's lymphoma. Focal hypermetabolic activity within the left sternal manubrium and left iliac bone, also potentially related to the patient's lymphoma. No evidence of solid visceral organ involvement.  CT guided bone marrow biopsy done -- no evidence of lymphoma involving Bone marrow. Normal LDH. No constitutional symptoms. Patient has no  significant medical comorbidities at baseline. ECHO with nl EF as expected.  Patient has completed 6 cycles of R CHOP PET/CT s/p 6 cycles of R-CHOP shows complete metabolic response CT chest/abd/pelvis 07/07/2017 - showed no radipgraphic evidence of lymphoma progression at this time.  Lab Results  Component Value Date   LDH 190 05/22/2018   2) Rituxan hypersensitivity (mild grade 1 hives on the back ) - resolved with solumedrol and antihistamines. No issues with dexamethasone  pretreatment -Patient will continue dexamethasone premedication to reduce risk of Rituxan hypersensitivity.  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 -The pt has no prohibitive toxicities from continuing her tenth cycle of maintenance Rituxan, with dexamethasone prior to infusion, at this time.   -No live attenuated virus vaccines -Continue age appropriate cancer screening and mammograms with PCP  -Counseled on infection prevention strategies. She is up to date on her vaccinations.  -Will do repeat CT scans after completion of 12 doses of maintenance Rituxan, earlier if concerning symptoms present.  -Continue maintenance Vitamin D three times a week -Will see the pt back in 2 months    RTC with Dr Irene Limbo on 3/23 with labs for next dose of maintenance Rituxan   Orders Placed This Encounter  Procedures  . CBC with Differential/Platelet    Standing Status:   Future    Standing Expiration Date:   06/26/2019  . CMP ([place] only)    Standing Status:   Future    Standing Expiration Date:   05/23/2019  . Lactate dehydrogenase    Standing Status:   Future    Standing Expiration Date:   05/23/2019    All of the patients questions were answered with apparent satisfaction. The patient knows to call the clinic with any problems, questions or concerns.  The total time spent in the appt was 25 minutes and more than 50% was on counseling and direct patient cares.  Sullivan Lone MD [place] AAHIVMS [facility] Hematology/Oncology Physician [facility]  (Office):       [phone] (Work cell):  [phone] (Fax):           [phone]  I, Baldwin [place], am acting as a scribe for Dr. Sullivan Lone.   .I have reviewed the above documentation for accuracy and completeness, and I  agree with the above. Brunetta Genera MD

## 2018-05-22 ENCOUNTER — Inpatient Hospital Stay: Payer: Medicare Other

## 2018-05-22 ENCOUNTER — Inpatient Hospital Stay (HOSPITAL_BASED_OUTPATIENT_CLINIC_OR_DEPARTMENT_OTHER): Payer: Medicare Other | Admitting: Hematology

## 2018-05-22 VITALS — BP 140/78 | HR 59 | Temp 98.3°F | Resp 18 | Ht 69.0 in | Wt 170.6 lb

## 2018-05-22 VITALS — BP 131/60 | HR 61 | Temp 97.9°F | Resp 18

## 2018-05-22 DIAGNOSIS — Z8249 Family history of ischemic heart disease and other diseases of the circulatory system: Secondary | ICD-10-CM | POA: Diagnosis not present

## 2018-05-22 DIAGNOSIS — C8298 Follicular lymphoma, unspecified, lymph nodes of multiple sites: Secondary | ICD-10-CM

## 2018-05-22 DIAGNOSIS — C8330 Diffuse large B-cell lymphoma, unspecified site: Secondary | ICD-10-CM

## 2018-05-22 DIAGNOSIS — E559 Vitamin D deficiency, unspecified: Secondary | ICD-10-CM

## 2018-05-22 DIAGNOSIS — Z5112 Encounter for antineoplastic immunotherapy: Secondary | ICD-10-CM | POA: Diagnosis not present

## 2018-05-22 DIAGNOSIS — G620 Drug-induced polyneuropathy: Secondary | ICD-10-CM

## 2018-05-22 DIAGNOSIS — R32 Unspecified urinary incontinence: Secondary | ICD-10-CM | POA: Diagnosis not present

## 2018-05-22 DIAGNOSIS — Z7189 Other specified counseling: Secondary | ICD-10-CM

## 2018-05-22 LAB — CMP (CANCER CENTER ONLY)
ALT: 17 U/L (ref 0–44)
AST: 12 U/L — ABNORMAL LOW (ref 15–41)
Albumin: 4.4 g/dL (ref 3.5–5.0)
Alkaline Phosphatase: 119 U/L (ref 38–126)
Anion gap: 10 (ref 5–15)
BUN: 14 mg/dL (ref 8–23)
CO2: 22 mmol/L (ref 22–32)
Calcium: 10.2 mg/dL (ref 8.9–10.3)
Chloride: 106 mmol/L (ref 98–111)
Creatinine: 0.7 mg/dL (ref 0.44–1.00)
GFR, Est AFR Am: >60 mL/min (ref >60)
GFR, Estimated: >60 mL/min (ref >60)
Glucose, Bld: 110 mg/dL — ABNORMAL HIGH (ref 70–99)
Potassium: 4.4 mmol/L (ref 3.5–5.1)
Sodium: 138 mmol/L (ref 135–145)
Total Bilirubin: 0.5 mg/dL (ref 0.3–1.2)
Total Protein: 7 g/dL (ref 6.5–8.1)

## 2018-05-22 LAB — CBC WITH DIFFERENTIAL/PLATELET
Abs Immature Granulocytes: 0.03 10*3/uL (ref 0.00–0.07)
Basophils Absolute: 0 10*3/uL (ref 0.0–0.1)
Basophils Relative: 0 %
Eosinophils Absolute: 0.3 10*3/uL (ref 0.0–0.5)
Eosinophils Relative: 2 %
HCT: 39.3 % (ref 36.0–46.0)
Hemoglobin: 13.4 g/dL (ref 12.0–15.0)
Immature Granulocytes: 0 %
Lymphocytes Relative: 3 %
Lymphs Abs: 0.4 10*3/uL — ABNORMAL LOW (ref 0.7–4.0)
MCH: 31 pg (ref 26.0–34.0)
MCHC: 34.1 g/dL (ref 30.0–36.0)
MCV: 91 fL (ref 80.0–100.0)
Monocytes Absolute: 0.5 10*3/uL (ref 0.1–1.0)
Monocytes Relative: 4 %
Neutro Abs: 10.3 10*3/uL — ABNORMAL HIGH (ref 1.7–7.7)
Neutrophils Relative %: 91 %
Platelets: 286 10*3/uL (ref 150–400)
RBC: 4.32 MIL/uL (ref 3.87–5.11)
RDW: 12.5 % (ref 11.5–15.5)
WBC: 11.4 10*3/uL — ABNORMAL HIGH (ref 4.0–10.5)
nRBC: 0 % (ref 0.0–0.2)

## 2018-05-22 LAB — LACTATE DEHYDROGENASE: LDH: 190 U/L (ref 98–192)

## 2018-05-22 MED ORDER — DIPHENHYDRAMINE HCL 25 MG PO CAPS
50.0000 mg | ORAL_CAPSULE | Freq: Once | ORAL | Status: AC
  Administered 2018-05-22: 50 mg via ORAL

## 2018-05-22 MED ORDER — SODIUM CHLORIDE 0.9 % IV SOLN
Freq: Once | INTRAVENOUS | Status: AC
  Administered 2018-05-22: 10:00:00 via INTRAVENOUS
  Filled 2018-05-22: qty 250

## 2018-05-22 MED ORDER — ACETAMINOPHEN 325 MG PO TABS
ORAL_TABLET | ORAL | Status: AC
  Filled 2018-05-22: qty 2

## 2018-05-22 MED ORDER — SODIUM CHLORIDE 0.9 % IV SOLN
375.0000 mg/m2 | Freq: Once | INTRAVENOUS | Status: AC
  Administered 2018-05-22: 700 mg via INTRAVENOUS
  Filled 2018-05-22: qty 20

## 2018-05-22 MED ORDER — ACETAMINOPHEN 325 MG PO TABS
650.0000 mg | ORAL_TABLET | Freq: Once | ORAL | Status: AC
  Administered 2018-05-22: 650 mg via ORAL

## 2018-05-22 MED ORDER — DIPHENHYDRAMINE HCL 25 MG PO CAPS
ORAL_CAPSULE | ORAL | Status: AC
  Filled 2018-05-22: qty 2

## 2018-05-22 MED ORDER — SODIUM CHLORIDE 0.9 % IV SOLN
12.0000 mg | Freq: Once | INTRAVENOUS | Status: AC
  Administered 2018-05-22: 12 mg via INTRAVENOUS
  Filled 2018-05-22: qty 1.2

## 2018-05-22 NOTE — Patient Instructions (Signed)
[facility] Discharge Instructions for Patients Receiving Chemotherapy  Today you received the following chemotherapy agents Rituximab (RITUXAN).  To help prevent nausea and vomiting after your treatment, we encourage you to take your nausea medication as prescribed.   If you develop nausea and vomiting that is not controlled by your nausea medication, call the clinic.   BELOW ARE SYMPTOMS THAT SHOULD BE REPORTED IMMEDIATELY:  *FEVER GREATER THAN 100.5 F  *CHILLS WITH OR WITHOUT FEVER  NAUSEA AND VOMITING THAT IS NOT CONTROLLED WITH YOUR NAUSEA MEDICATION  *UNUSUAL SHORTNESS OF BREATH  *UNUSUAL BRUISING OR BLEEDING  TENDERNESS IN MOUTH AND THROAT WITH OR WITHOUT PRESENCE OF ULCERS  *URINARY PROBLEMS  *BOWEL PROBLEMS  UNUSUAL RASH Items with * indicate a potential emergency and should be followed up as soon as possible.  Feel free to call the clinic should you have any questions or concerns. The clinic phone number is [phone].  Please show the CHEMO ALERT CARD at check-in to the Emergency Department and triage nurse.   

## 2018-05-22 NOTE — Patient Instructions (Signed)
Thank you for choosing [facility] to provide your oncology and hematology care.  To afford each patient quality time with our providers, please arrive 30 minutes before your scheduled appointment time.  If you arrive late for your appointment, you may be asked to reschedule.  We strive to give you quality time with our providers, and arriving late affects you and other patients whose appointments are after yours.    If you are a no show for multiple scheduled visits, you may be dismissed from the clinic at the providers discretion.     Again, thank you for choosing [facility], our hope is that these requests will decrease the amount of time that you wait before being seen by our physicians.  ______________________________________________________________________   Should you have questions after your visit to the [facility], please contact our office at [phone] between the hours of 8:30 and 4:30 p.m.    Voicemails left after 4:30p.m will not be returned until the following business day.     For prescription refill requests, please have your pharmacy contact us directly.  Please also try to allow 48 hours for prescription requests.     Please contact the scheduling department for questions regarding scheduling.  For scheduling of procedures such as PET scans, CT scans, MRI, Ultrasound, etc please contact central scheduling at [phone].     Resources For Cancer Patients and Caregivers:    Oncolink.org:  A wonderful resource for patients and healthcare providers for information regarding your disease, ways to tract your treatment, what to expect, etc.      American Cancer Society:  800-227-2345  Can help patients locate various types of support and financial assistance   Cancer Care: 1-800-813-HOPE (4673) Provides financial assistance, online support groups, medication/co-pay assistance.     [place] DSS:  336-641-3447 Where to apply  for food stamps, Medicaid, and utility assistance   Medicare Rights Center: 800-333-4114 Helps people with Medicare understand their rights and benefits, navigate the Medicare system, and secure the quality healthcare they deserve   SCAT: 336-333-6589 [place] [employer]'s shared-ride transportation service for eligible riders who have a disability that prevents them from riding the fixed route bus.     For additional information on assistance programs please contact our social worker:   Abigail Elmore:  [phone]  

## 2018-05-23 ENCOUNTER — Telehealth: Payer: Self-pay | Admitting: Hematology

## 2018-05-23 NOTE — Telephone Encounter (Signed)
Per 01/20 los RTC with Dr Irene Limbo on 3/23 with labs for next dose of maintenance Rituxan.  Appointment was already scheduled.

## 2018-05-29 ENCOUNTER — Encounter: Payer: Medicare Other | Admitting: Family Medicine

## 2018-06-16 DIAGNOSIS — M7541 Impingement syndrome of right shoulder: Secondary | ICD-10-CM | POA: Diagnosis not present

## 2018-06-16 DIAGNOSIS — M25511 Pain in right shoulder: Secondary | ICD-10-CM | POA: Diagnosis not present

## 2018-06-16 MED FILL — traMADol HCL 50 MG TABS: 50 | 5 days supply | Qty: 20 | Fill #0

## 2018-06-22 DIAGNOSIS — M7501 Adhesive capsulitis of right shoulder: Secondary | ICD-10-CM | POA: Diagnosis not present

## 2018-06-22 DIAGNOSIS — M25511 Pain in right shoulder: Secondary | ICD-10-CM | POA: Diagnosis not present

## 2018-06-25 ENCOUNTER — Encounter: Payer: Self-pay | Admitting: Medical

## 2018-06-26 ENCOUNTER — Telehealth: Payer: Self-pay | Admitting: *Deleted

## 2018-06-26 ENCOUNTER — Inpatient Hospital Stay: Payer: Medicare Other | Attending: Hematology | Admitting: Medical

## 2018-06-26 VITALS — BP 143/80 | HR 68 | Temp 98.7°F | Resp 18 | Ht 69.0 in | Wt 163.8 lb

## 2018-06-26 DIAGNOSIS — J040 Acute laryngitis: Secondary | ICD-10-CM

## 2018-06-26 DIAGNOSIS — C8298 Follicular lymphoma, unspecified, lymph nodes of multiple sites: Secondary | ICD-10-CM | POA: Diagnosis not present

## 2018-06-26 MED ORDER — LEVOFLOXACIN 500 MG PO TABS: 500.0000 mg | ORAL_TABLET | Freq: Every day | ORAL | 0 refills | Status: DC

## 2018-06-26 MED ORDER — BENZONATATE 100 MG PO CAPS: 100.0000 mg | ORAL_CAPSULE | Freq: Three times a day (TID) | ORAL | 0 refills | Status: DC | PRN

## 2018-06-26 MED ORDER — HYDROCODONE-HOMATROPINE 5-1.5 MG/5ML PO SYRP: 5.0000 mL | ORAL_SOLUTION | Freq: Four times a day (QID) | ORAL | 0 refills | Status: DC | PRN

## 2018-06-26 MED FILL — levoFLOXacin 500 MG TABS: 500 | 7 days supply | Qty: 7 | Fill #0

## 2018-06-26 MED FILL — HYDROCODONE-HOMATROPINE SYR: 5-1.5 | 6 days supply | Qty: 120 | Fill #0

## 2018-06-26 MED FILL — BENZONATATE 100 MG CAP: 100 | 7 days supply | Qty: 21 | Fill #0

## 2018-06-26 NOTE — Telephone Encounter (Signed)
Received walk-in form from front registration desk. Spoke with pt in the lobby. Pt states she has laryngitis since Saturday evening.  States it has gotten worse.  Denies fever, but states she had chills last evening. No headache, ear pain but does have a 'runny nose'. Discussed with Sandi Mealy, PA in [facility]. He will see her today. No labs needed  Pt aware.  High priority scheduling message sent.

## 2018-06-29 NOTE — Progress Notes (Signed)
[facility] Progress Note   Lindsay Harrison [id number] 04/13/1946 73 y.o.  Lindsay Harrison is managed by Dr. 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  Follicular lymphoma: The patient continues to be managed by Dr. Sullivan Lone.  She is status post cycle 10, day 1 of rituximab which was given on 05/22/2018.  She is scheduled to see Dr. Irene Limbo on 07/24/2018.  Please see After Visit Summary for patient specific instructions.  Future Appointments  Date Time Provider Cache  07/13/2018  1:45 PM [name], MD [facility]  07/24/2018  8:30 AM [facility] ONLY [facility] None  07/24/2018  9:00 AM Brunetta Genera, MD [facility] None  07/24/2018 10:00 AM [facility] INFUSION [facility] None  09/18/2018  8:30 AM [facility] None  09/18/2018  9:00 AM Brunetta Genera, MD [facility] None  09/18/2018 10:00 AM [facility] INFUSION [facility] None  02/15/2019  1:00 PM Britt, [place] A, RN [facility]    No orders of the defined types were placed in this encounter.      Subjective:   Patient ID:  Lindsay Harrison is a 73 y.o. (DOB 28-Feb-1946) female.  Chief Complaint: No chief complaint on file.   HPI Lindsay Harrison is a 73 year old female with a history of follicular lymphoma who is managed by Dr. Sullivan Lone.  She is status post  cycle 10, day 1 of rituximab which was given on 05/22/2018.  She presents to clinic today as a walk-in patient.  She reports that she had chills on Saturday evening and again last night.  She had no fever.  She has rhinorrhea a cough.  She has lost her voice.  She denies constipation, diarrhea, nausea, or vomiting.  She has had no sick contacts of which she is aware.  Medications: I have reviewed the patient's current medications.  Allergies:  Allergies  Allergen Reactions  . Erythromycin Other (See Comments)    Patient states that she passed out while using this medication ? SYNCOPE ?  [name] Other Anaphylaxis    # # # CATS # # #    Past Medical History:  Diagnosis Date  . Anemia    h/o iron deficiency secondary to heavy menses  . Arthritis    left hand index finger  . Diffuse large B cell lymphoma ([place])   . Grief reaction 01/27/2016  . H/O measles    as a child  . History of chicken pox    as a child  . History of PCOS   . Hyperlipidemia, mixed 01/27/2016  . Lymphadenopathy   . 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 Preventative health care 01/27/2016  . Vitamin D deficiency   . Wears glasses     Past Surgical History:  Procedure Laterality Date  . COLONOSCOPY    . IR GENERIC HISTORICAL  05/21/2016   IR FLUORO GUIDE PORT INSERTION RIGHT 05/21/2016 Arne Cleveland, MD [facility]  . IR GENERIC HISTORICAL  05/21/2016   IR US GUIDE  VASC ACCESS RIGHT 05/21/2016 Arne Cleveland, MD [facility]  . IR REMOVAL TUN ACCESS W/ PORT W/O FL MOD SED  10/27/2016  . SKIN BIOPSY Left    face  . THYROGLOSSAL DUCT CYST N/A 04/16/2016   Procedure: THYROGLOSSAL DUCT CYST excision;  Surgeon: [name], MD;  Location: [facility] OR;  Service: ENT;  Laterality: N/A;    Family History  Problem Relation Age of Onset  . Arthritis Mother        rheumatoid  . Heart disease Father        CHF  . COPD Sister        Emphysema, h/o cigarettes  . Other Sister        pituatary tumor  . Arthritis Sister   .  Obesity Sister   . Cancer Paternal Grandfather        cancer  . Stroke Maternal Aunt     Social History   Socioeconomic History  . Marital status: Widowed    Spouse name: Not on file  . Number of children: Not on file  . Years of education: Not on file  . Highest education level: Not on file  Occupational History  . Occupation: Psychotherapist  Social Needs  . Financial resource strain: Not on file  . Food insecurity:    Worry: Not on file    Inability: Not on file  . Transportation needs:    Medical: Not on file    Non-medical: Not on file  Tobacco Use  . Smoking status: Never Smoker  . Smokeless tobacco: Never Used  Substance and Sexual Activity  . Alcohol use: Yes    Alcohol/week: 3.0 - 6.0 standard drinks    Types: 3 - 6 Standard drinks or equivalent per week    Comment: 5 glasses of wine a week.  . Drug use: No  . Sexual activity: Never    Partners: Male    Birth control/protection: None  Lifestyle  . Physical activity:    Days per week: Not on file    Minutes per session: Not on file  . Stress: Not on file  Relationships  . Social connections:    Talks on phone: Not on file    Gets together: Not on file    Attends religious service: Not on file    Active member of club or organization: Not on file    Attends meetings of clubs or organizations: Not on file    Relationship status: Not on file  . Intimate partner violence:    Fear of current or ex partner: Not on file    Emotionally abused: Not on file    Physically abused: Not on file    Forced sexual activity: Not on file  Other Topics Concern  . Not on file  Social History Narrative   Married. Education: [employer]. Exercise: walking, yoga, and bicycling daily for 1-2 hours. Lives alone, works as Management consultant    Past Medical History, Surgical history, Social history, and Family history were reviewed and updated as appropriate.   Please see review of systems for further details on the patient's  review from today.   Review of Systems:  Review of Systems  Constitutional: Negative for chills, diaphoresis, fatigue and fever.  HENT: Positive for sore throat and voice change. Negative for congestion, postnasal drip, rhinorrhea and trouble swallowing.   Respiratory: Positive for cough. Negative for shortness of breath and wheezing.   Cardiovascular: Negative for palpitations.  Gastrointestinal: Negative for constipation, diarrhea, nausea and vomiting.  Genitourinary: Negative for difficulty urinating.  Musculoskeletal: Negative for arthralgias and myalgias.  Neurological: Negative for headaches.    Objective:   Physical Exam:  BP (!) 143/80 (BP Location: Left Arm, Patient Position: Sitting)   Pulse 68   Temp 98.7 F (37.1 C) (Oral)   Resp 18   Ht 5\' 9"  (1.753 m)   Wt 163 lb 12.8 oz (74.3 kg)   SpO2 100%   BMI 24.19 kg/m  ECOG: 0  Physical Exam Constitutional:      General: She is not in acute distress.    Appearance: She is not diaphoretic.  HENT:     Head: Normocephalic and atraumatic.     Right Ear: Ear canal and external ear normal.     Left Ear: Tympanic membrane, ear canal and external ear normal.     Ears:     Comments: The right tympanic membrane is hazy.    Mouth/Throat:     Mouth: Mucous membranes are moist.     Pharynx: Posterior oropharyngeal erythema present. No oropharyngeal exudate.  Eyes:     General: No scleral icterus.       Right eye: No discharge.        Left eye: No discharge.  Cardiovascular:     Rate and Rhythm: Normal rate and regular rhythm.     Heart sounds: Normal heart sounds. No murmur. No friction rub. No gallop.   Pulmonary:     Effort: Pulmonary effort is normal. No respiratory distress.     Breath sounds: Normal breath sounds. No wheezing or rales.  Abdominal:     General: Bowel sounds are normal. There is no distension.     Tenderness: There is no abdominal tenderness. There is no guarding.  Skin:    General: Skin is warm and  dry.     Findings: No erythema or rash.  Neurological:     Mental Status: She is alert.     Coordination: Coordination normal.     Gait: Gait normal.     Lab Review:     Component Value Date/Time   NA 138 05/22/2018 0822   NA 137 03/07/2017 0842   K 4.4 05/22/2018 0822   K 4.3 03/07/2017 0842   CL 106 05/22/2018 0822   CO2 22 05/22/2018 0822   CO2 21 (L) 03/07/2017 0842   GLUCOSE 110 (H) 05/22/2018 0822   GLUCOSE 123 03/07/2017 0842   BUN 14 05/22/2018 0822   BUN 14.0 03/07/2017 0842   CREATININE 0.70 05/22/2018 0822   CREATININE 0.7 03/07/2017 0842   CALCIUM 10.2 05/22/2018 0822   CALCIUM 10.2 03/07/2017 0842   PROT 7.0 05/22/2018 0822   PROT 7.2 03/07/2017 0842   ALBUMIN 4.4 05/22/2018 0822   ALBUMIN 4.3 03/07/2017 0842   AST 12 (L) 05/22/2018 0822   AST 15 03/07/2017 0842   ALT 17 05/22/2018 0822   ALT 25 03/07/2017 0842   ALKPHOS 119 05/22/2018 0822   ALKPHOS 131 03/07/2017 0842   BILITOT 0.5 05/22/2018 0822   BILITOT 0.45 03/07/2017 0842   GFRNONAA >60 05/22/2018 0822   GFRAA >60 05/22/2018 0822       Component Value Date/Time   WBC 11.4 (H) 05/22/2018 0822   RBC 4.32 05/22/2018 0822   HGB 13.4 05/22/2018 0822   HGB 13.9 05/10/2018 0946   HGB 13.3 03/07/2017 0842   HCT 39.3 05/22/2018 0822   HCT 39.1 03/07/2017 0842   PLT 286 05/22/2018 0822   PLT 272 05/10/2018 0946  PLT 283 03/07/2017 0842   MCV 91.0 05/22/2018 0822   MCV 91.4 03/07/2017 0842   MCH 31.0 05/22/2018 0822   MCHC 34.1 05/22/2018 0822   RDW 12.5 05/22/2018 0822   RDW 13.3 03/07/2017 0842   LYMPHSABS 0.4 (L) 05/22/2018 0822   LYMPHSABS 0.4 (L) 03/07/2017 0842   MONOABS 0.5 05/22/2018 0822   MONOABS 0.7 03/07/2017 0842   EOSABS 0.3 05/22/2018 0822   EOSABS 0.0 03/07/2017 0842   BASOSABS 0.0 05/22/2018 0822   BASOSABS 0.0 03/07/2017 0842   -------------------------------  Imaging from last 24 hours (if applicable):  Radiology interpretation: No results found.

## 2018-06-30 DIAGNOSIS — M25511 Pain in right shoulder: Secondary | ICD-10-CM | POA: Diagnosis not present

## 2018-07-06 DIAGNOSIS — M25511 Pain in right shoulder: Secondary | ICD-10-CM | POA: Diagnosis not present

## 2018-07-06 DIAGNOSIS — M7501 Adhesive capsulitis of right shoulder: Secondary | ICD-10-CM | POA: Diagnosis not present

## 2018-07-10 ENCOUNTER — Ambulatory Visit: Payer: Medicare Other

## 2018-07-10 ENCOUNTER — Ambulatory Visit: Payer: Medicare Other | Admitting: Hematology

## 2018-07-10 ENCOUNTER — Other Ambulatory Visit: Payer: Medicare Other

## 2018-07-10 MED FILL — DEXAMETHASONE 4 MG TABLET: 4 | 2 days supply | Qty: 12 | Fill #1

## 2018-07-13 ENCOUNTER — Encounter: Payer: Self-pay | Admitting: Family Medicine

## 2018-07-13 ENCOUNTER — Ambulatory Visit (INDEPENDENT_AMBULATORY_CARE_PROVIDER_SITE_OTHER): Payer: Medicare Other | Admitting: Family Medicine

## 2018-07-13 ENCOUNTER — Other Ambulatory Visit: Payer: Self-pay

## 2018-07-13 DIAGNOSIS — R739 Hyperglycemia, unspecified: Secondary | ICD-10-CM

## 2018-07-13 DIAGNOSIS — D649 Anemia, unspecified: Secondary | ICD-10-CM | POA: Diagnosis not present

## 2018-07-13 DIAGNOSIS — Z0000 Encounter for general adult medical examination without abnormal findings: Secondary | ICD-10-CM | POA: Diagnosis not present

## 2018-07-13 DIAGNOSIS — C8298 Follicular lymphoma, unspecified, lymph nodes of multiple sites: Secondary | ICD-10-CM

## 2018-07-13 DIAGNOSIS — E782 Mixed hyperlipidemia: Secondary | ICD-10-CM | POA: Diagnosis not present

## 2018-07-13 DIAGNOSIS — N952 Postmenopausal atrophic vaginitis: Secondary | ICD-10-CM | POA: Diagnosis not present

## 2018-07-13 DIAGNOSIS — E559 Vitamin D deficiency, unspecified: Secondary | ICD-10-CM

## 2018-07-13 NOTE — Assessment & Plan Note (Signed)
Is doing well and continuing to follow wit oncology

## 2018-07-13 NOTE — Patient Instructions (Addendum)
Shingrix is the new shingles shot,n 2 shots over 2-6 months at the pharmacy   Preventive Care 65 Years and Older, Female Preventive care refers to lifestyle choices and visits with your health care provider that can promote health and wellness. What does preventive care include?  A yearly physical exam. This is also called an annual well check.  Dental exams once or twice a year.  Routine eye exams. Ask your health care provider how often you should have your eyes checked.  Personal lifestyle choices, including: ? Daily care of your teeth and gums. ? Regular physical activity. ? Eating a healthy diet. ? Avoiding tobacco and drug use. ? Limiting alcohol use. ? Practicing safe sex. ? Taking low-dose aspirin every day. ? Taking vitamin and mineral supplements as recommended by your health care provider. What happens during an annual well check? The services and screenings done by your health care provider during your annual well check will depend on your age, overall health, lifestyle risk factors, and family history of disease. Counseling Your health care provider may ask you questions about your:  Alcohol use.  Tobacco use.  Drug use.  Emotional well-being.  Home and relationship well-being.  Sexual activity.  Eating habits.  History of falls.  Memory and ability to understand (cognition).  Work and work Statistician.  Reproductive health.  Screening You may have the following tests or measurements:  Height, weight, and BMI.  Blood pressure.  Lipid and cholesterol levels. These may be checked every 5 years, or more frequently if you are over 62 years old.  Skin check.  Lung cancer screening. You may have this screening every year starting at age 71 if you have a 30-pack-year history of smoking and currently smoke or have quit within the past 15 years.  Colorectal cancer screening. All adults should have this screening starting at age 35 and continuing until  age 63. You will have tests every 1-10 years, depending on your results and the type of screening test. People at increased risk should start screening at an earlier age. Screening tests may include: ? Guaiac-based fecal occult blood testing. ? Fecal immunochemical test (FIT). ? Stool DNA test. ? Virtual colonoscopy. ? Sigmoidoscopy. During this test, a flexible tube with a tiny camera (sigmoidoscope) is used to examine your rectum and lower colon. The sigmoidoscope is inserted through your anus into your rectum and lower colon. ? Colonoscopy. During this test, a long, thin, flexible tube with a tiny camera (colonoscope) is used to examine your entire colon and rectum.  Hepatitis C blood test.  Hepatitis B blood test.  Sexually transmitted disease (STD) testing.  Diabetes screening. This is done by checking your blood sugar (glucose) after you have not eaten for a while (fasting). You may have this done every 1-3 years.  Bone density scan. This is done to screen for osteoporosis. You may have this done starting at age 72.  Mammogram. This may be done every 1-2 years. Talk to your health care provider about how often you should have regular mammograms. Talk with your health care provider about your test results, treatment options, and if necessary, the need for more tests. Vaccines Your health care provider may recommend certain vaccines, such as:  Influenza vaccine. This is recommended every year.  Tetanus, diphtheria, and acellular pertussis (Tdap, Td) vaccine. You may need a Td booster every 10 years.  Varicella vaccine. You may need this if you have not been vaccinated.  Zoster vaccine. You may need  this after age 88.  Measles, mumps, and rubella (MMR) vaccine. You may need at least one dose of MMR if you were born in 1957 or later. You may also need a second dose.  Pneumococcal 13-valent conjugate (PCV13) vaccine. One dose is recommended after age 3.  Pneumococcal  polysaccharide (PPSV23) vaccine. One dose is recommended after age 53.  Meningococcal vaccine. You may need this if you have certain conditions.  Hepatitis A vaccine. You may need this if you have certain conditions or if you travel or work in places where you may be exposed to hepatitis A.  Hepatitis B vaccine. You may need this if you have certain conditions or if you travel or work in places where you may be exposed to hepatitis B.  Haemophilus influenzae type b (Hib) vaccine. You may need this if you have certain conditions. Talk to your health care provider about which screenings and vaccines you need and how often you need them. This information is not intended to replace advice given to you by your health care provider. Make sure you discuss any questions you have with your health care provider. Document Released: 05/16/2015 Document Revised: 06/09/2017 Document Reviewed: 02/18/2015 Elsevier Interactive Patient Education  2019 [employer].

## 2018-07-13 NOTE — Assessment & Plan Note (Addendum)
Patient encouraged to maintain heart healthy diet, regular exercise, adequate sleep. Consider daily probiotics. Take medications as prescribed. Is hoping to go to [place] in April but may have to postpone to the fall

## 2018-07-13 NOTE — Assessment & Plan Note (Signed)
Encouraged heart healthy diet, increase exercise, avoid trans fats, consider a krill oil cap daily 

## 2018-07-13 NOTE — Assessment & Plan Note (Signed)
As premarin cream to use but has not used it yet. May use prn

## 2018-07-13 NOTE — Assessment & Plan Note (Signed)
monitor

## 2018-07-13 NOTE — Assessment & Plan Note (Signed)
hgba1c acceptable, minimize simple carbs. Increase exercise as tolerated.  

## 2018-07-13 NOTE — Assessment & Plan Note (Signed)
Supplement and monitor 

## 2018-07-14 LAB — COMPREHENSIVE METABOLIC PANEL
ALT: 17 U/L (ref 0–35)
AST: 16 U/L (ref 0–37)
Albumin: 4.5 g/dL (ref 3.5–5.2)
Alkaline Phosphatase: 96 U/L (ref 39–117)
BUN: 14 mg/dL (ref 6–23)
CO2: 29 mEq/L (ref 19–32)
Calcium: 9.5 mg/dL (ref 8.4–10.5)
Chloride: 102 mEq/L (ref 96–112)
Creatinine, Ser: 0.65 mg/dL (ref 0.40–1.20)
GFR: 89.52 mL/min (ref >60.00)
Glucose, Bld: 86 mg/dL (ref 70–99)
Potassium: 4.1 mEq/L (ref 3.5–5.1)
Sodium: 139 mEq/L (ref 135–145)
Total Bilirubin: 0.6 mg/dL (ref 0.2–1.2)
Total Protein: 6.4 g/dL (ref 6.0–8.3)

## 2018-07-14 LAB — CBC
HCT: 40.6 % (ref 36.0–46.0)
Hemoglobin: 13.9 g/dL (ref 12.0–15.0)
MCHC: 34.2 g/dL (ref 30.0–36.0)
MCV: 92.2 fl (ref 78.0–100.0)
Platelets: 281 10*3/uL (ref 150.0–400.0)
RBC: 4.41 Mil/uL (ref 3.87–5.11)
RDW: 13.3 % (ref 11.5–15.5)
WBC: 5.6 10*3/uL (ref 4.0–10.5)

## 2018-07-14 LAB — LIPID PANEL
Cholesterol: 222 mg/dL — ABNORMAL HIGH (ref 0–200)
HDL: 85.2 mg/dL (ref >39.00)
LDL Cholesterol: 123 mg/dL — ABNORMAL HIGH (ref 0–99)
NonHDL: 136.38
Total CHOL/HDL Ratio: 3
Triglycerides: 69 mg/dL (ref 0.0–149.0)
VLDL: 13.8 mg/dL (ref 0.0–40.0)

## 2018-07-14 LAB — HEMOGLOBIN A1C: Hgb A1c MFr Bld: 5.6 % (ref 4.6–6.5)

## 2018-07-14 LAB — VITAMIN D 25 HYDROXY (VIT D DEFICIENCY, FRACTURES): VITD: 27.38 ng/mL — ABNORMAL LOW (ref 30.00–100.00)

## 2018-07-14 LAB — TSH: TSH: 1.23 u[IU]/mL (ref 0.35–4.50)

## 2018-07-16 NOTE — Progress Notes (Signed)
Subjective:    [name], female    DOB: 26-Aug-1945, 73 y.o.   MRN: [id number]  Chief Complaint  Patient presents with  . Annual Exam    Pt states no concerns.    HPI Patient is in today for annual preventative exam. No recent febrile illness or hospitalizations. No polyuria or polydipsia. Is doing well with activities of daily living. Is trying to stay active and maintain a heart healthy diet. Denies CP/palp/SOB/HA/congestion/fevers/GI or GU c/o. Taking meds as prescribed. She has noted some trouble with vaginal irritation and has been given some premarin cream but has not used it thus far.   Past Medical History:  Diagnosis Date  . Anemia    h/o iron deficiency secondary to heavy menses  . Arthritis    left hand index finger  . Diffuse large B cell lymphoma ([place])   . Grief reaction 01/27/2016  . H/O measles    as a child  . History of chicken pox    as a child  . History of PCOS   . Hyperlipidemia, mixed 01/27/2016  . Lymphadenopathy   . 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 Preventative health care 01/27/2016  . Vitamin D deficiency   . Wears glasses     Past Surgical History:  Procedure Laterality Date  . COLONOSCOPY    . IR GENERIC HISTORICAL  05/21/2016   IR FLUORO GUIDE PORT INSERTION RIGHT 05/21/2016 Arne Cleveland, MD [facility]  . IR GENERIC HISTORICAL  05/21/2016   IR US GUIDE VASC ACCESS RIGHT 05/21/2016 Arne Cleveland, MD [facility]  . IR REMOVAL TUN ACCESS W/ PORT W/O FL MOD SED  10/27/2016  . SKIN BIOPSY Left    face  . THYROGLOSSAL DUCT CYST N/A 04/16/2016   Procedure: THYROGLOSSAL DUCT CYST excision;  Surgeon: [name], MD;  Location: [facility] OR;  Service: ENT;  Laterality: N/A;    Family History  Problem Relation Age of Onset  . Arthritis Mother        rheumatoid  . Heart disease Father        CHF  . COPD Sister        Emphysema, h/o cigarettes  . Other Sister        pituatary tumor  . Arthritis Sister   . Obesity Sister    . Cancer Paternal Grandfather        cancer  . Stroke Maternal Aunt     Social History   Socioeconomic History  . Marital status: Widowed    Spouse name: Not on file  . Number of children: Not on file  . Years of education: Not on file  . Highest education level: Not on file  Occupational History  . Occupation: Psychotherapist  Social Needs  . Financial resource strain: Not on file  . Food insecurity:    Worry: Not on file    Inability: Not on file  . Transportation needs:    Medical: Not on file    Non-medical: Not on file  Tobacco Use  . Smoking status: Never Smoker  . Smokeless tobacco: Never Used  Substance and Sexual Activity  . Alcohol use: Yes    Alcohol/week: 3.0 - 6.0 standard drinks    Types: 3 - 6 Standard drinks or equivalent per week    Comment: 5 glasses of wine a week.  . Drug use: No  . Sexual activity: Never    Partners: Male    Birth control/protection: None  Lifestyle  . Physical  activity:    Days per week: Not on file    Minutes per session: Not on file  . Stress: Not on file  Relationships  . Social connections:    Talks on phone: Not on file    Gets together: Not on file    Attends religious service: Not on file    Active member of club or organization: Not on file    Attends meetings of clubs or organizations: Not on file    Relationship status: Not on file  . Intimate partner violence:    Fear of current or ex partner: Not on file    Emotionally abused: Not on file    Physically abused: Not on file    Forced sexual activity: Not on file  Other Topics Concern  . Not on file  Social History Narrative   Married. Education: [employer]. Exercise: walking, yoga, and bicycling daily for 1-2 hours. Lives alone, works as psychotherapist    Outpatient Medications Prior to Visit  Medication Sig Dispense Refill  . Calcium-Magnesium-Vitamin D (CALCIUM MAGNESIUM PO) Take 1 tablet by mouth daily.    . Cholecalciferol (VITAMIN D) 2000 units CAPS  Take 2,000 Units by mouth daily.    [name] dexamethasone (DECADRON) 4 MG tablet TAKE 3 TABLETS BY MOUTH TWICE DAILY, TAKE 1 DOSE IN THE MORNING AND 1 DOSE IN THE EVENING THE DAY PRIOR TO Rituxan 12 tablet 6  . Omega-3 Fatty Acids (OMEGA-3 PO) Take 2 capsules by mouth daily. Omega 3 1280 mg    . Vitamin D, Ergocalciferol, (DRISDOL) 1.25 MG (50000 UT) CAPS capsule TAKE 1 CAPSULE BY MOUTH EVERY 7 DAYS 4 capsule 0  . benzonatate (TESSALON) 100 MG capsule Take 1 capsule (100 mg total) by mouth 3 (three) times daily as needed for cough. 21 capsule 0  . conjugated estrogens (PREMARIN) vaginal cream Place 1 Applicatorful vaginally daily. 42.5 g 3  . HYDROcodone-homatropine (HYCODAN) 5-1.5 MG/5ML syrup Take 5 mLs by mouth every 6 (six) hours as needed for cough. 120 mL 0  . levofloxacin (LEVAQUIN) 500 MG tablet Take 1 tablet (500 mg total) by mouth daily. 7 tablet 0   No facility-administered medications prior to visit.     Allergies  Allergen Reactions  . Erythromycin Other (See Comments)    Patient states that she passed out while using this medication ? SYNCOPE ?  [name] Other Anaphylaxis    # # # CATS # # #    Review of Systems  Constitutional: Negative for chills, fever and malaise/fatigue.  HENT: Negative for congestion and hearing loss.   Eyes: Negative for discharge.  Respiratory: Negative for cough, sputum production and shortness of breath.   Cardiovascular: Negative for chest pain, palpitations and leg swelling.  Gastrointestinal: Negative for abdominal pain, blood in stool, constipation, diarrhea, heartburn, nausea and vomiting.  Genitourinary: Negative for dysuria, frequency, hematuria and urgency.  Musculoskeletal: Negative for back pain, falls and myalgias.  Skin: Negative for rash.  Neurological: Negative for dizziness, sensory change, loss of consciousness, weakness and headaches.  Endo/Heme/Allergies: Negative for environmental allergies. Does not bruise/bleed easily.   Psychiatric/Behavioral: Negative for depression and suicidal ideas. The patient is not nervous/anxious and does not have insomnia.        Objective:    Physical Exam Constitutional:      General: She is not in acute distress.    Appearance: She is well-developed.  HENT:     Head: Normocephalic and atraumatic.  Eyes:     Conjunctiva/sclera: Conjunctivae  normal.  Neck:     Musculoskeletal: Neck supple.     Thyroid: No thyromegaly.  Cardiovascular:     Rate and Rhythm: Normal rate and regular rhythm.     Heart sounds: Normal heart sounds. No murmur.  Pulmonary:     Effort: Pulmonary effort is normal. No respiratory distress.     Breath sounds: Normal breath sounds.  Abdominal:     General: Bowel sounds are normal. There is no distension.     Palpations: Abdomen is soft. There is no mass.     Tenderness: There is no abdominal tenderness.  Lymphadenopathy:     Cervical: No cervical adenopathy.  Skin:    General: Skin is warm and dry.  Neurological:     Mental Status: She is alert and oriented to person, place, and time.  Psychiatric:        Behavior: Behavior normal.     BP 134/74 (BP Location: Left Arm, Patient Position: Sitting, Cuff Size: Normal)   Pulse 67   Temp 98.6 F (37 C) (Oral)   Resp 18   Ht '5\' 9"'$  (1.753 m)   Wt 159 lb 12.8 oz (72.5 kg)   SpO2 100%   BMI 23.60 kg/m  Wt Readings from Last 3 Encounters:  07/13/18 159 lb 12.8 oz (72.5 kg)  06/26/18 163 lb 12.8 oz (74.3 kg)  05/22/18 170 lb 9.6 oz (77.4 kg)     Lab Results  Component Value Date   WBC 5.6 07/13/2018   HGB 13.9 07/13/2018   HCT 40.6 07/13/2018   PLT 281.0 07/13/2018   GLUCOSE 86 07/13/2018   CHOL 222 (H) 07/13/2018   TRIG 69.0 07/13/2018   HDL 85.20 07/13/2018   LDLCALC 123 (H) 07/13/2018   ALT 17 07/13/2018   AST 16 07/13/2018   NA 139 07/13/2018   K 4.1 07/13/2018   CL 102 07/13/2018   CREATININE 0.65 07/13/2018   BUN 14 07/13/2018   CO2 29 07/13/2018   TSH 1.23 07/13/2018    INR 1.00 10/27/2016   HGBA1C 5.6 07/13/2018    Lab Results  Component Value Date   TSH 1.23 07/13/2018   Lab Results  Component Value Date   WBC 5.6 07/13/2018   HGB 13.9 07/13/2018   HCT 40.6 07/13/2018   MCV 92.2 07/13/2018   PLT 281.0 07/13/2018   Lab Results  Component Value Date   NA 139 07/13/2018   K 4.1 07/13/2018   CHLORIDE 106 03/07/2017   CO2 29 07/13/2018   GLUCOSE 86 07/13/2018   BUN 14 07/13/2018   CREATININE 0.65 07/13/2018   BILITOT 0.6 07/13/2018   ALKPHOS 96 07/13/2018   AST 16 07/13/2018   ALT 17 07/13/2018   PROT 6.4 07/13/2018   ALBUMIN 4.5 07/13/2018   CALCIUM 9.5 07/13/2018   ANIONGAP 10 05/22/2018   EGFR >60 03/07/2017   GFR 89.52 07/13/2018   Lab Results  Component Value Date   CHOL 222 (H) 07/13/2018   Lab Results  Component Value Date   HDL 85.20 07/13/2018   Lab Results  Component Value Date   LDLCALC 123 (H) 07/13/2018   Lab Results  Component Value Date   TRIG 69.0 07/13/2018   Lab Results  Component Value Date   CHOLHDL 3 07/13/2018   Lab Results  Component Value Date   HGBA1C 5.6 07/13/2018       Assessment & Plan:   Problem List Items Addressed This Visit    Hyperlipidemia, mixed    Encouraged  heart healthy diet, increase exercise, avoid trans fats, consider a krill oil cap daily      Relevant Orders   Lipid panel (Completed)   TSH (Completed)   Vitamin D deficiency    Supplement and monitor      Relevant Orders   Vitamin D (25 hydroxy) (Completed)   Preventative health care    Patient encouraged to maintain heart healthy diet, regular exercise, adequate sleep. Consider daily probiotics. Take medications as prescribed. Is hoping to go to [place] in April but may have to postpone to the fall      Anemia    monitor      Relevant Orders   CBC (Completed)   NHL (non-Hodgkin's lymphoma) ([place])    Is doing well and continuing to follow wit oncology      Hyperglycemia    hgba1c acceptable,  minimize simple carbs. Increase exercise as tolerated.      Relevant Orders   Hemoglobin A1c (Completed)   Comprehensive metabolic panel (Completed)   TSH (Completed)   Atrophic vaginitis    As premarin cream to use but has not used it yet. May use prn         I have discontinued [name] "Pat"'s Vitamin D (Ergocalciferol), conjugated estrogens, benzonatate, HYDROcodone-homatropine, and levofloxacin. I am also having her maintain her Omega-3 Fatty Acids (OMEGA-3 PO), Calcium-Magnesium-Vitamin D (CALCIUM MAGNESIUM PO), Vitamin D, and dexamethasone.  No orders of the defined types were placed in this encounter.    Penni Homans, MD

## 2018-07-17 MED ORDER — VITAMIN D (ERGOCALCIFEROL) 1.25 MG (50000 UNIT) PO CAPS: 50000.0000 [IU] | ORAL_CAPSULE | ORAL | 4 refills | Status: DC

## 2018-07-17 NOTE — Addendum Note (Signed)
Addended byDamita Dunnings D on: 07/17/2018 09:37 AM   Modules accepted: Orders

## 2018-07-19 ENCOUNTER — Telehealth: Payer: Self-pay | Admitting: *Deleted

## 2018-07-19 NOTE — Telephone Encounter (Signed)
Received Medical records from [facility]; forwarded to provider/SLS 03/18

## 2018-07-21 ENCOUNTER — Telehealth: Payer: Self-pay | Admitting: *Deleted

## 2018-07-21 NOTE — Progress Notes (Signed)
[name]     HEMATOLOGY/ONCOLOGY CLINIC NOTE  Date of Service:  07/24/18     Patient Care Team: [name], MD as PCP - General (Family Medicine) [name] M.D. (ENT)  CHIEF COMPLAINTS/PURPOSE OF CONSULTATION:   F/u for follicular lymphoma/DLBCL  HISTORY OF PRESENTING ILLNESS:   Please see previous note for details of initial presentation.  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 She has completed 6 cycles of R CHOP and is currently in complete remission. She presents today for her eleventh round of maintenance Rituxan. The patient's last visit with [place] was on 05/22/18. The pt reports that she is doing well overall.   The pt reports that she has not developed any new concerns in the interim. She has been walking 8 miles a day and denies any concerns for infections. She endorses good energy levels. The pt denies noticing any new lumps or bumps and denies abdominal pains.   The pt notes that her vision is not as "crisp," in the week following her infusions. She notes that this resolves by itself.  Lab results today (07/24/18) of CBC w/diff and CMP is as follows: all values are WNL except for WBC at 11.1k, ANC at 10.2k, Lymphs abs at 300, CO2 at 21, Glucose at 131. 07/24/18 LDH at 217  On review of systems, pt reports good energy levels, staying active, and denies concerns for infections, noticing any new lumps or bumps, pain along the spine, abdominal pains, leg swelling, and any other symptoms.  MEDICAL HISTORY:  Past Medical History:  Diagnosis Date  . Anemia    h/o iron deficiency secondary to heavy menses  . Arthritis    left hand index finger  . Diffuse large B cell lymphoma ([place])   . Grief reaction 01/27/2016  . H/O measles    as a child  . History of chicken pox    as a child  . History of PCOS   . Hyperlipidemia, mixed 01/27/2016  . Lymphadenopathy   . 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  Preventative health care 01/27/2016  . Vitamin D deficiency   . Wears glasses     SURGICAL HISTORY: Past Surgical History:  Procedure Laterality Date  . COLONOSCOPY    . IR GENERIC HISTORICAL  05/21/2016   IR FLUORO GUIDE PORT INSERTION RIGHT 05/21/2016 Arne Cleveland, MD [facility]  . IR GENERIC HISTORICAL  05/21/2016   IR US GUIDE VASC ACCESS RIGHT 05/21/2016 Arne Cleveland, MD [facility]  . IR REMOVAL TUN ACCESS W/ PORT W/O FL MOD SED  10/27/2016  . SKIN BIOPSY Left    face  . THYROGLOSSAL DUCT CYST N/A 04/16/2016   Procedure: THYROGLOSSAL DUCT CYST excision;  Surgeon: [name], MD;  Location: [place];  Service: ENT;  Laterality: N/A;    SOCIAL HISTORY: Social History   Socioeconomic History  . Marital status: Widowed    Spouse name: Not on file  . Number of children: Not on file  . Years of education: Not on file  . Highest education level: Not on file  Occupational History  . Occupation: Psychotherapist  Social Needs  . Financial resource strain: Not on file  . Food insecurity:    Worry: Not on file    Inability: Not on file  . Transportation needs:    Medical: Not on file    Non-medical: Not on file  Tobacco Use  . Smoking status: Never Smoker  .  Smokeless tobacco: Never Used  Substance and Sexual Activity  . Alcohol use: Yes    Alcohol/week: 3.0 - 6.0 standard drinks    Types: 3 - 6 Standard drinks or equivalent per week    Comment: 5 glasses of wine a week.  . Drug use: No  . Sexual activity: Never    Partners: Male    Birth control/protection: None  Lifestyle  . Physical activity:    Days per week: Not on file    Minutes per session: Not on file  . Stress: Not on file  Relationships  . Social connections:    Talks on phone: Not on file    Gets together: Not on file    Attends religious service: Not on file    Active member of club or organization: Not on file    Attends meetings of clubs or organizations: Not on file    Relationship status:  Not on file  . Intimate partner violence:    Fear of current or ex partner: Not on file    Emotionally abused: Not on file    Physically abused: Not on file    Forced sexual activity: Not on file  Other Topics Concern  . Not on file  Social History Narrative   Married. Education: [employer]. Exercise: walking, yoga, and bicycling daily for 1-2 hours. Lives alone, works as Management consultant    FAMILY HISTORY: Family History  Problem Relation Age of Onset  . Arthritis Mother        rheumatoid  . Heart disease Father        CHF  . COPD Sister        Emphysema, h/o cigarettes  . Other Sister        pituatary tumor  . Arthritis Sister   . Obesity Sister   . Cancer Paternal Grandfather        cancer  . Stroke Maternal Aunt     ALLERGIES:  is allergic to erythromycin and other.  MEDICATIONS:  Current Outpatient Medications  Medication Sig Dispense Refill  . Calcium-Magnesium-Vitamin D (CALCIUM MAGNESIUM PO) Take 1 tablet by mouth daily.    . Cholecalciferol (VITAMIN D) 2000 units CAPS Take 2,000 Units by mouth daily.    [name] dexamethasone (DECADRON) 4 MG tablet TAKE 3 TABLETS BY MOUTH TWICE DAILY, TAKE 1 DOSE IN THE MORNING AND 1 DOSE IN THE EVENING THE DAY PRIOR TO Rituxan 12 tablet 6  . Omega-3 Fatty Acids (OMEGA-3 PO) Take 2 capsules by mouth daily. Omega 3 1280 mg    . Vitamin D, Ergocalciferol, (DRISDOL) 1.25 MG (50000 UT) CAPS capsule Take 1 capsule (50,000 Units total) by mouth every 7 (seven) days. 4 capsule 4   No current facility-administered medications for this visit.     REVIEW OF SYSTEMS:    A 10+ POINT REVIEW OF SYSTEMS WAS OBTAINED including neurology, dermatology, psychiatry, cardiac, respiratory, lymph, extremities, GI, GU, Musculoskeletal, constitutional, breasts, reproductive, HEENT.  All pertinent positives are noted in the HPI.  All others are negative.   PHYSICAL EXAMINATION:  ECOG PERFORMANCE STATUS: 0 - Asymptomatic  Vitals:   07/24/18 0854  BP: (!)  142/63  Pulse: 68  Resp: 18  Temp: 98.2 F (36.8 C)  SpO2: 100%   Filed Weights   07/24/18 0854  Weight: 161 lb 1.6 oz (73.1 kg)   .Body mass index is 23.79 kg/m.  GENERAL:alert, in no acute distress and comfortable SKIN: no acute rashes, no significant lesions EYES: conjunctiva are pink and  non-injected, sclera anicteric OROPHARYNX: MMM, no exudates, no oropharyngeal erythema or ulceration NECK: supple, no JVD LYMPH:  no palpable lymphadenopathy in the cervical, axillary or inguinal regions LUNGS: clear to auscultation b/l with normal respiratory effort HEART: regular rate & rhythm ABDOMEN:  normoactive bowel sounds , non tender, not distended. No palpable hepatosplenomegaly.  Extremity: no pedal edema PSYCH: alert & oriented x 3 with fluent speech NEURO: no focal motor/sensory deficits    LABORATORY DATA:  I have reviewed the data as listed  . CBC Latest Ref Rng & Units 07/24/2018 07/13/2018 05/22/2018  WBC 4.0 - 10.5 K/uL 11.1(H) 5.6 11.4(H)  Hemoglobin 12.0 - 15.0 g/dL 13.8 13.9 13.4  Hematocrit 36.0 - 46.0 % 40.3 40.6 39.3  Platelets 150 - 400 K/uL 281 281.0 286   CBC    Component Value Date/Time   WBC 11.1 (H) 07/24/2018 0815   RBC 4.49 07/24/2018 0815   HGB 13.8 07/24/2018 0815   HGB 13.9 05/10/2018 0946   HGB 13.3 03/07/2017 0842   HCT 40.3 07/24/2018 0815   HCT 39.1 03/07/2017 0842   PLT 281 07/24/2018 0815   PLT 272 05/10/2018 0946   PLT 283 03/07/2017 0842   MCV 89.8 07/24/2018 0815   MCV 91.4 03/07/2017 0842   MCH 30.7 07/24/2018 0815   MCHC 34.2 07/24/2018 0815   RDW 12.6 07/24/2018 0815   RDW 13.3 03/07/2017 0842   LYMPHSABS 0.3 (L) 07/24/2018 0815   LYMPHSABS 0.4 (L) 03/07/2017 0842   MONOABS 0.6 07/24/2018 0815   MONOABS 0.7 03/07/2017 0842   EOSABS 0.0 07/24/2018 0815   EOSABS 0.0 03/07/2017 0842   BASOSABS 0.0 07/24/2018 0815   BASOSABS 0.0 03/07/2017 0842    . CMP Latest Ref Rng & Units 07/24/2018 07/13/2018 05/22/2018  Glucose 70 - 99  mg/dL 131(H) 86 110(H)  BUN 8 - 23 mg/dL 16 14 14   Creatinine 0.44 - 1.00 mg/dL 0.78 0.65 0.70  Sodium 135 - 145 mmol/L 139 139 138  Potassium 3.5 - 5.1 mmol/L 4.2 4.1 4.4  Chloride 98 - 111 mmol/L 104 102 106  CO2 22 - 32 mmol/L 21(L) 29 22  Calcium 8.9 - 10.3 mg/dL 9.8 9.5 10.2  Total Protein 6.5 - 8.1 g/dL 7.0 6.4 7.0  Total Bilirubin 0.3 - 1.2 mg/dL 0.5 0.6 0.5  Alkaline Phos 38 - 126 U/L 111 96 119  AST 15 - 41 U/L 15 16 12(L)  ALT 0 - 44 U/L 21 17 17    Component     Latest Ref Rng & Units 05/07/2016  Hepatitis B Surface Ag     Negative Negative  Hep B Core Ab, Tot     Negative Negative  Hep C Virus Ab     0.0 - 0.9 s/co ratio <0.1   . Lab Results  Component Value Date   LDH 217 (H) 07/24/2018         RADIOGRAPHIC STUDIES:   ASSESSMENT & PLAN:   73 y.o. 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  Initial PET/CT showed Scattered small mildly hypermetabolic lymph nodes within the neck, left hilum, left axilla and left groin, potentially related the patient's lymphoma. Focal hypermetabolic activity within the left sternal manubrium and left iliac bone, also potentially related to the patient's lymphoma. No evidence of solid visceral organ involvement.  CT guided bone marrow biopsy done -- no evidence of lymphoma involving Bone marrow. Normal  LDH. No constitutional symptoms. Patient has no significant medical comorbidities at baseline. ECHO with nl EF as expected.  Patient has completed 6 cycles of R CHOP PET/CT s/p 6 cycles of R-CHOP shows complete metabolic response CT chest/abd/pelvis 07/07/2017 - showed no radipgraphic evidence of lymphoma progression at this time.  Lab Results  Component Value Date   LDH 217 (H) 07/24/2018   2) Rituxan hypersensitivity (mild grade 1 hives on the back ) - resolved with solumedrol and antihistamines. No issues with  dexamethasone pretreatment -Patient will continue dexamethasone premedication to reduce risk of Rituxan hypersensitivity.  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 LDH at 217. -Discussed that the pt has the option to hold maintenance Rituxan at this time, given the [place] pandemic and the associated immunosuppression. She would like to continue with treatment. -The pt has no prohibitive toxicities from continuing her eleventh cycle of maintenance Rituxan, with Dexamethasone prior to infusion, at this time. -Continue maintenance Vitamin D three times a week, last count from 3/12/0 at 27.38 -No live attenuated virus vaccines -Advised crowd avoidance, and recommended infection prevention precautions. She is up to date on her vaccinations. -Will do repeat CT scans after completion of 12 doses of maintenance Rituxan, earlier if concerning symptoms present.  -Continue age appropriate cancer screening and mammograms with PCP -Will see the pt back in May 2020   F/u as per scheduled appointments for next dose of Maintenance Rituxan, labs and MD visit in may 2020   Orders Placed This Encounter  Procedures  . CBC with Differential/Platelet    Standing Status:   Future    Standing Expiration Date:   08/29/2019  . CMP ([place] only)    Standing Status:   Future    Standing Expiration Date:   07/25/2019  . Lactate dehydrogenase    Standing Status:   Future    Standing Expiration Date:   07/25/2019    All of the patients questions were answered with apparent satisfaction. The patient knows to call the clinic with any problems, questions or concerns.  The total time spent in the appt was 25 minutes and more than 50% was on counseling and direct patient cares.   Sullivan Lone MD [place] AAHIVMS [facility] Hematology/Oncology Physician [facility]  (Office):        [phone] (Work cell):  [phone] (Fax):           [phone]  I, Baldwin [place], am acting as a scribe for Dr. Sullivan Lone.   .I have reviewed the above documentation for accuracy and completeness, and I agree with the above. Brunetta Genera MD

## 2018-07-21 NOTE — Telephone Encounter (Signed)
Contacted patient for Monday appointment COVID -19 pre-screening call. Patient answered no to all screening questions and is asymptomatic. Advised of screening on arrival to [facility]. Patient verbalized understanding.

## 2018-07-24 ENCOUNTER — Inpatient Hospital Stay: Payer: Medicare Other

## 2018-07-24 ENCOUNTER — Inpatient Hospital Stay (HOSPITAL_BASED_OUTPATIENT_CLINIC_OR_DEPARTMENT_OTHER): Payer: Medicare Other | Admitting: Hematology

## 2018-07-24 ENCOUNTER — Other Ambulatory Visit: Payer: Self-pay

## 2018-07-24 ENCOUNTER — Telehealth: Payer: Self-pay | Admitting: Hematology

## 2018-07-24 ENCOUNTER — Inpatient Hospital Stay: Payer: Medicare Other | Attending: Hematology

## 2018-07-24 VITALS — BP 112/60 | HR 61 | Temp 98.6°F | Resp 17

## 2018-07-24 VITALS — BP 142/63 | HR 68 | Temp 98.2°F | Resp 18 | Ht 69.0 in | Wt 161.1 lb

## 2018-07-24 DIAGNOSIS — Z7189 Other specified counseling: Secondary | ICD-10-CM

## 2018-07-24 DIAGNOSIS — Z5112 Encounter for antineoplastic immunotherapy: Secondary | ICD-10-CM

## 2018-07-24 DIAGNOSIS — G620 Drug-induced polyneuropathy: Secondary | ICD-10-CM | POA: Diagnosis not present

## 2018-07-24 DIAGNOSIS — C8330 Diffuse large B-cell lymphoma, unspecified site: Secondary | ICD-10-CM

## 2018-07-24 DIAGNOSIS — C8298 Follicular lymphoma, unspecified, lymph nodes of multiple sites: Secondary | ICD-10-CM

## 2018-07-24 LAB — CMP (CANCER CENTER ONLY)
ALT: 21 U/L (ref 0–44)
AST: 15 U/L (ref 15–41)
Albumin: 4.2 g/dL (ref 3.5–5.0)
Alkaline Phosphatase: 111 U/L (ref 38–126)
Anion gap: 14 (ref 5–15)
BUN: 16 mg/dL (ref 8–23)
CO2: 21 mmol/L — ABNORMAL LOW (ref 22–32)
Calcium: 9.8 mg/dL (ref 8.9–10.3)
Chloride: 104 mmol/L (ref 98–111)
Creatinine: 0.78 mg/dL (ref 0.44–1.00)
GFR, Est AFR Am: >60 mL/min (ref >60)
GFR, Estimated: >60 mL/min (ref >60)
Glucose, Bld: 131 mg/dL — ABNORMAL HIGH (ref 70–99)
Potassium: 4.2 mmol/L (ref 3.5–5.1)
Sodium: 139 mmol/L (ref 135–145)
Total Bilirubin: 0.5 mg/dL (ref 0.3–1.2)
Total Protein: 7 g/dL (ref 6.5–8.1)

## 2018-07-24 LAB — CBC WITH DIFFERENTIAL/PLATELET
Abs Immature Granulocytes: 0.03 10*3/uL (ref 0.00–0.07)
Basophils Absolute: 0 10*3/uL (ref 0.0–0.1)
Basophils Relative: 0 %
Eosinophils Absolute: 0 10*3/uL (ref 0.0–0.5)
Eosinophils Relative: 0 %
HCT: 40.3 % (ref 36.0–46.0)
Hemoglobin: 13.8 g/dL (ref 12.0–15.0)
Immature Granulocytes: 0 %
Lymphocytes Relative: 3 %
Lymphs Abs: 0.3 10*3/uL — ABNORMAL LOW (ref 0.7–4.0)
MCH: 30.7 pg (ref 26.0–34.0)
MCHC: 34.2 g/dL (ref 30.0–36.0)
MCV: 89.8 fL (ref 80.0–100.0)
Monocytes Absolute: 0.6 10*3/uL (ref 0.1–1.0)
Monocytes Relative: 5 %
Neutro Abs: 10.2 10*3/uL — ABNORMAL HIGH (ref 1.7–7.7)
Neutrophils Relative %: 92 %
Platelets: 281 10*3/uL (ref 150–400)
RBC: 4.49 MIL/uL (ref 3.87–5.11)
RDW: 12.6 % (ref 11.5–15.5)
WBC: 11.1 10*3/uL — ABNORMAL HIGH (ref 4.0–10.5)
nRBC: 0 % (ref 0.0–0.2)

## 2018-07-24 LAB — LACTATE DEHYDROGENASE: LDH: 217 U/L — ABNORMAL HIGH (ref 98–192)

## 2018-07-24 MED ORDER — ACETAMINOPHEN 325 MG PO TABS
650.0000 mg | ORAL_TABLET | Freq: Once | ORAL | Status: AC
  Administered 2018-07-24: 650 mg via ORAL

## 2018-07-24 MED ORDER — SODIUM CHLORIDE 0.9 % IV SOLN
12.0000 mg | Freq: Once | INTRAVENOUS | Status: AC
  Administered 2018-07-24: 12 mg via INTRAVENOUS
  Filled 2018-07-24: qty 1.2

## 2018-07-24 MED ORDER — DIPHENHYDRAMINE HCL 25 MG PO CAPS
50.0000 mg | ORAL_CAPSULE | Freq: Once | ORAL | Status: AC
  Administered 2018-07-24: 50 mg via ORAL

## 2018-07-24 MED ORDER — SODIUM CHLORIDE 0.9 % IV SOLN
375.0000 mg/m2 | Freq: Once | INTRAVENOUS | Status: AC
  Administered 2018-07-24: 700 mg via INTRAVENOUS
  Filled 2018-07-24: qty 50

## 2018-07-24 MED ORDER — SODIUM CHLORIDE 0.9 % IV SOLN
Freq: Once | INTRAVENOUS | Status: AC
  Administered 2018-07-24: 11:00:00 via INTRAVENOUS
  Filled 2018-07-24: qty 250

## 2018-07-24 MED ORDER — DIPHENHYDRAMINE HCL 25 MG PO CAPS
ORAL_CAPSULE | ORAL | Status: AC
  Filled 2018-07-24: qty 2

## 2018-07-24 MED ORDER — ACETAMINOPHEN 325 MG PO TABS
ORAL_TABLET | ORAL | Status: AC
  Filled 2018-07-24: qty 2

## 2018-07-24 NOTE — Telephone Encounter (Signed)
Per 3/23 los, appt already scheduled.

## 2018-07-24 NOTE — Patient Instructions (Signed)
Coronavirus (COVID-19) Are you at risk?  Are you at risk for the Coronavirus (COVID-19)?  To be considered HIGH RISK for Coronavirus (COVID-19), you have to meet the following criteria:  . Traveled to [place], [place], [place], [place] or [place]; or in the [place] to [place], [place], [place], or [place]; and have fever, cough, and shortness of breath within the last 2 weeks of travel OR . Been in close contact with a person diagnosed with COVID-19 within the last 2 weeks and have fever, cough, and shortness of breath . IF YOU DO NOT MEET THESE CRITERIA, YOU ARE CONSIDERED LOW RISK FOR COVID-19.  What to do if you are HIGH RISK for COVID-19?  . If you are having a medical emergency, call 911. . Seek medical care right away. Before you go to a doctor's office, urgent care or emergency department, call ahead and tell them about your recent travel, contact with someone diagnosed with COVID-19, and your symptoms. You should receive instructions from your physician's office regarding next steps of care.  . When you arrive at healthcare provider, tell the healthcare staff immediately you have returned from visiting [place], [place], [place], [place] or [place]; or traveled in the [place] to [place], [place], [place], or [place]; in the last two weeks or you have been in close contact with a person diagnosed with COVID-19 in the last 2 weeks.   . Tell the health care staff about your symptoms: fever, cough and shortness of breath. . After you have been seen by a medical provider, you will be either: o Tested for (COVID-19) and discharged home on quarantine except to seek medical care if symptoms worsen, and asked to  - Stay home and avoid contact with others until you get your results (4-5 days)  - Avoid travel on public transportation if possible (such as bus, train, or airplane) or o Sent to the Emergency Department by EMS for evaluation, COVID-19 testing, and possible  admission depending on your condition and test results.  What to do if you are LOW RISK for COVID-19?  Reduce your risk of any infection by using the same precautions used for avoiding the common cold or flu:  . Wash your hands often with soap and warm water for at least 20 seconds.  If soap and water are not readily available, use an alcohol-based hand sanitizer with at least 60% alcohol.  . If coughing or sneezing, cover your mouth and nose by coughing or sneezing into the elbow areas of your shirt or coat, into a tissue or into your sleeve (not your hands). . Avoid shaking hands with others and consider head nods or verbal greetings only. . Avoid touching your eyes, nose, or mouth with unwashed hands.  . Avoid close contact with people who are sick. . Avoid places or events with large numbers of people in one location, like concerts or sporting events. . Carefully consider travel plans you have or are making. . If you are planning any travel outside or inside the [place], visit the CDC's Travelers' Health webpage for the latest health notices. . If you have some symptoms but not all symptoms, continue to monitor at home and seek medical attention if your symptoms worsen. . If you are having a medical emergency, call 911.  ADDITIONAL HEALTHCARE OPTIONS FOR PATIENTS  [facility] / e-Visit: [email or web address]         [facility]: [phone]  [facility]: [phone]                   [facility]: [phone]   [facility] Discharge Instructions for Patients Receiving Chemotherapy  Today you received the following chemotherapy agents: Rituximab (Rituxan)  To help prevent nausea and vomiting after your treatment, we encourage you to take your nausea medication as directed.    If you develop nausea and vomiting that is not controlled by your nausea medication, call the clinic.    BELOW ARE SYMPTOMS THAT SHOULD BE REPORTED IMMEDIATELY:  *FEVER GREATER THAN 100.5 F  *CHILLS WITH OR WITHOUT FEVER  NAUSEA AND VOMITING THAT IS NOT CONTROLLED WITH YOUR NAUSEA MEDICATION  *UNUSUAL SHORTNESS OF BREATH  *UNUSUAL BRUISING OR BLEEDING  TENDERNESS IN MOUTH AND THROAT WITH OR WITHOUT PRESENCE OF ULCERS  *URINARY PROBLEMS  *BOWEL PROBLEMS  UNUSUAL RASH Items with * indicate a potential emergency and should be followed up as soon as possible.  Feel free to call the clinic should you have any questions or concerns. The clinic phone number is [phone].  Please show the CHEMO ALERT CARD at check-in to the Emergency Department and triage nurse.   

## 2018-09-10 IMAGING — XA IR US GUIDE VASC ACCESS RIGHT
1 series · 1 of 1 positions shown · non-contrast
Comparison: none

CLINICAL DATA: Diffuse large B-cell lymphoma, needs durable venous
access for chemotherapy regimen
TECHNIQUE: The procedure, risks, benefits, and alternatives were explained to
the patient. Questions regarding the procedure were encouraged and
answered. The patient understands and consents to the procedure. As
antibiotic prophylaxis, cefazolin 2 g was ordered pre-procedure and
administered intravenously within one hour of incision. Patency of
the right IJ vein was confirmed with ultrasound with image
documentation. An appropriate skin site was determined. Skin site
was marked. Region was prepped using maximum barrier technique
including cap and mask, sterile gown, sterile gloves, large sterile
sheet, and Chlorhexidine as cutaneous antisepsis. The region was
infiltrated locally with 1% lidocaine. Under real-time ultrasound
guidance, the right IJ vein was accessed with a 21 gauge
micropuncture needle; the needle tip within the vein was confirmed
with ultrasound image documentation. Needle was exchanged over a 018
guidewire for transitional dilator which allowed passage of the
Benson wire into the IVC. Over this, the transitional dilator was
exchanged for a 5 French MPA catheter. A small incision was made on
the right anterior chest wall and a subcutaneous pocket fashioned.
The power-injectable port was positioned and its catheter tunneled
to the right IJ dermatotomy site. The MPA catheter was exchanged
over an Amplatz wire for a peel-away sheath, through which the port
catheter, which had been trimmed to the appropriate length, was
advanced and positioned under fluoroscopy with its tip at the
cavoatrial junction. Spot chest radiograph confirms good catheter
position and no pneumothorax. The pocket was closed with deep
interrupted and subcuticular continuous 3-0 Monocryl sutures. The
port was flushed per protocol. The incisions were covered with
Dermabond then covered with a sterile dressing.

COMPLICATIONS:
COMPLICATIONS
None immediate

[Series 300: line placements · 1 of 1 slices shown]
[im 1/1]
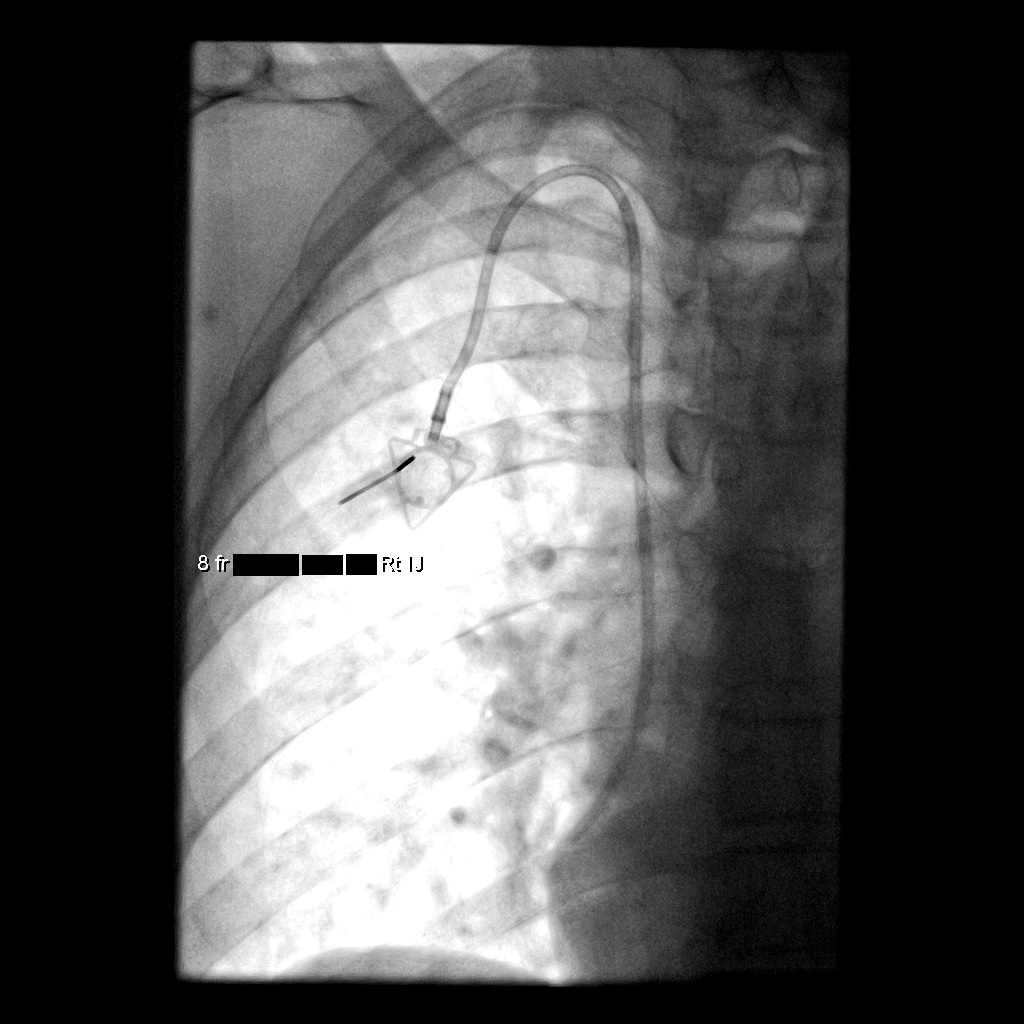

[1 of 1 positions shown; findings below may reference images not displayed]

EXAM:
TUNNELED PORT CATHETER PLACEMENT WITH ULTRASOUND AND FLUOROSCOPIC
GUIDANCE

FLUOROSCOPY TIME:  0.1 minute (15 u6ym6 DAP)

ANESTHESIA/SEDATION:
Intravenous Fentanyl and Versed were administered as conscious
sedation during continuous monitoring of the patient's level of
consciousness and physiological / cardiorespiratory status by the
radiology RN, with a total moderate sedation time of 15 minutes.
IMPRESSION: Technically successful right IJ power-injectable port catheter
placement. Ready for routine use.

## 2018-09-11 ENCOUNTER — Ambulatory Visit: Payer: Medicare Other

## 2018-09-11 ENCOUNTER — Encounter: Payer: Self-pay | Admitting: Hematology

## 2018-09-11 ENCOUNTER — Ambulatory Visit: Payer: Medicare Other | Admitting: Hematology

## 2018-09-11 ENCOUNTER — Other Ambulatory Visit: Payer: Medicare Other

## 2018-09-14 NOTE — Progress Notes (Signed)
[name]     HEMATOLOGY/ONCOLOGY CLINIC NOTE  Date of Service:  09/18/18     Patient Care Team: [name], MD as PCP - General (Family Medicine) [name] M.D. (ENT)  CHIEF COMPLAINTS/PURPOSE OF CONSULTATION:   F/u for follicular lymphoma/DLBCL  HISTORY OF PRESENTING ILLNESS:   Please see previous note for details of initial presentation.  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 She has completed 6 cycles of R CHOP and is currently in complete remission. She presents today for her twelfth round of maintenance Rituxan. The patient's last visit with [place] was on 07/24/18. The pt reports that she is doing well overall.  The pt reports that she has continued walking at least 6 miles every day. She endorses good energy levels and is eating well. She notes that she has been able to avoid public spaces and denies any concerns for infections. The pt denies noticing any new lumps or bumps and denies fevers, chills, or night sweats. She denies mouth sores. She notes that she has occasional, infrequent light tingling in her toes when she goes to sleep and denies this being new.  Lab results today (09/18/18) of CBC w/diff and CMP is as follows: all values are WNL except for WBC at 10.9k, ANC at 10.0k, Lymphs abs at 300, Glucose at 126, AST at 13. 09/18/18 LDH is normal at 182  On review of systems, pt reports good energy levels, staying active, eating well, and denies concerns for infections, mouth sores, new lumps or bumps, fevers, chills, night sweats, and any other symptoms.   MEDICAL HISTORY:  Past Medical History:  Diagnosis Date  . Anemia    h/o iron deficiency secondary to heavy menses  . Arthritis    left hand index finger  . Diffuse large B cell lymphoma ([place])   . Grief reaction 01/27/2016  . H/O measles    as a child  . History of chicken pox    as a child  . History of PCOS   . Hyperlipidemia, mixed  01/27/2016  . Lymphadenopathy   . 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 Preventative health care 01/27/2016  . Vitamin D deficiency   . Wears glasses     SURGICAL HISTORY: Past Surgical History:  Procedure Laterality Date  . COLONOSCOPY    . IR GENERIC HISTORICAL  05/21/2016   IR FLUORO GUIDE PORT INSERTION RIGHT 05/21/2016 Arne Cleveland, MD [facility]INTER[facility]  . IR GENERIC HISTORICAL  05/21/2016   IR US GUIDE VASC ACCESS RIGHT 05/21/2016 Arne Cleveland, MD [facility]  . IR REMOVAL TUN ACCESS W/ PORT W/O FL MOD SED  10/27/2016  . SKIN BIOPSY Left    face  . THYROGLOSSAL DUCT CYST N/A 04/16/2016   Procedure: THYROGLOSSAL DUCT CYST excision;  Surgeon: [name], MD;  Location: [facility];  Service: ENT;  Laterality: N/A;    SOCIAL HISTORY: Social History   Socioeconomic History  . Marital status: Widowed    Spouse name: Not on file  . Number of children: Not on file  . Years of education: Not on file  . Highest education level: Not on file  Occupational History  . Occupation: Psychotherapist  Social Needs  . Financial resource strain: Not on file  . Food insecurity:    Worry: Not on file    Inability: Not on file  . Transportation needs:    Medical: Not on file    Non-medical: Not on  file  Tobacco Use  . Smoking status: Never Smoker  . Smokeless tobacco: Never Used  Substance and Sexual Activity  . Alcohol use: Yes    Alcohol/week: 3.0 - 6.0 standard drinks    Types: 3 - 6 Standard drinks or equivalent per week    Comment: 5 glasses of wine a week.  . Drug use: No  . Sexual activity: Never    Partners: Male    Birth control/protection: None  Lifestyle  . Physical activity:    Days per week: Not on file    Minutes per session: Not on file  . Stress: Not on file  Relationships  . Social connections:    Talks on phone: Not on file    Gets together: Not on file    Attends religious service: Not on file    Active member of club or organization: Not on file    Attends  meetings of clubs or organizations: Not on file    Relationship status: Not on file  . Intimate partner violence:    Fear of current or ex partner: Not on file    Emotionally abused: Not on file    Physically abused: Not on file    Forced sexual activity: Not on file  Other Topics Concern  . Not on file  Social History Narrative   Married. Education: [employer]. Exercise: walking, yoga, and bicycling daily for 1-2 hours. Lives alone, works as Management consultant    FAMILY HISTORY: Family History  Problem Relation Age of Onset  . Arthritis Mother        rheumatoid  . Heart disease Father        CHF  . COPD Sister        Emphysema, h/o cigarettes  . Other Sister        pituatary tumor  . Arthritis Sister   . Obesity Sister   . Cancer Paternal Grandfather        cancer  . Stroke Maternal Aunt     ALLERGIES:  is allergic to erythromycin and other.  MEDICATIONS:  Current Outpatient Medications  Medication Sig Dispense Refill  . Calcium-Magnesium-Vitamin D (CALCIUM MAGNESIUM PO) Take 1 tablet by mouth daily.    . Cholecalciferol (VITAMIN D) 2000 units CAPS Take 2,000 Units by mouth daily.    [name] dexamethasone (DECADRON) 4 MG tablet TAKE 3 TABLETS BY MOUTH TWICE DAILY, TAKE 1 DOSE IN THE MORNING AND 1 DOSE IN THE EVENING THE DAY PRIOR TO Rituxan 12 tablet 6  . Omega-3 Fatty Acids (OMEGA-3 PO) Take 2 capsules by mouth daily. Omega 3 1280 mg    . Vitamin D, Ergocalciferol, (DRISDOL) 1.25 MG (50000 UT) CAPS capsule Take 1 capsule (50,000 Units total) by mouth every 7 (seven) days. 4 capsule 4   No current facility-administered medications for this visit.     REVIEW OF SYSTEMS:    A 10+ POINT REVIEW OF SYSTEMS WAS OBTAINED including neurology, dermatology, psychiatry, cardiac, respiratory, lymph, extremities, GI, GU, Musculoskeletal, constitutional, breasts, reproductive, HEENT.  All pertinent positives are noted in the HPI.  All others are negative.   PHYSICAL EXAMINATION:  ECOG  PERFORMANCE STATUS: 0 - Asymptomatic  Vitals:   09/18/18 0904  BP: 108/85  Pulse: 64  Resp: 17  Temp: 97.8 F (36.6 C)  SpO2: 98%   Filed Weights   09/18/18 0904  Weight: 163 lb (73.9 kg)   .Body mass index is 24.07 kg/m.  GENERAL:alert, in no acute distress and comfortable SKIN: no acute  rashes, no significant lesions EYES: conjunctiva are pink and non-injected, sclera anicteric OROPHARYNX: MMM, no exudates, no oropharyngeal erythema or ulceration NECK: supple, no JVD LYMPH:  no palpable lymphadenopathy in the cervical, axillary or inguinal regions LUNGS: clear to auscultation b/l with normal respiratory effort HEART: regular rate & rhythm ABDOMEN:  normoactive bowel sounds , non tender, not distended. No palpable hepatosplenomegaly.  Extremity: no pedal edema PSYCH: alert & oriented x 3 with fluent speech NEURO: no focal motor/sensory deficits   LABORATORY DATA:  I have reviewed the data as listed  . CBC Latest Ref Rng & Units 09/18/2018 07/24/2018 07/13/2018  WBC 4.0 - 10.5 K/uL 10.9(H) 11.1(H) 5.6  Hemoglobin 12.0 - 15.0 g/dL 12.9 13.8 13.9  Hematocrit 36.0 - 46.0 % 38.2 40.3 40.6  Platelets 150 - 400 K/uL 271 281 281.0   CBC    Component Value Date/Time   WBC 10.9 (H) 09/18/2018 0826   RBC 4.20 09/18/2018 0826   HGB 12.9 09/18/2018 0826   HGB 13.9 05/10/2018 0946   HGB 13.3 03/07/2017 0842   HCT 38.2 09/18/2018 0826   HCT 39.1 03/07/2017 0842   PLT 271 09/18/2018 0826   PLT 272 05/10/2018 0946   PLT 283 03/07/2017 0842   MCV 91.0 09/18/2018 0826   MCV 91.4 03/07/2017 0842   MCH 30.7 09/18/2018 0826   MCHC 33.8 09/18/2018 0826   RDW 13.0 09/18/2018 0826   RDW 13.3 03/07/2017 0842   LYMPHSABS 0.3 (L) 09/18/2018 0826   LYMPHSABS 0.4 (L) 03/07/2017 0842   MONOABS 0.5 09/18/2018 0826   MONOABS 0.7 03/07/2017 0842   EOSABS 0.0 09/18/2018 0826   EOSABS 0.0 03/07/2017 0842   BASOSABS 0.0 09/18/2018 0826   BASOSABS 0.0 03/07/2017 0842    . CMP Latest  Ref Rng & Units 09/18/2018 07/24/2018 07/13/2018  Glucose 70 - 99 mg/dL 126(H) 131(H) 86  BUN 8 - 23 mg/dL 15 16 14   Creatinine 0.44 - 1.00 mg/dL 0.68 0.78 0.65  Sodium 135 - 145 mmol/L 139 139 139  Potassium 3.5 - 5.1 mmol/L 3.8 4.2 4.1  Chloride 98 - 111 mmol/L 107 104 102  CO2 22 - 32 mmol/L 22 21(L) 29  Calcium 8.9 - 10.3 mg/dL 9.6 9.8 9.5  Total Protein 6.5 - 8.1 g/dL 6.5 7.0 6.4  Total Bilirubin 0.3 - 1.2 mg/dL 0.5 0.5 0.6  Alkaline Phos 38 - 126 U/L 116 111 96  AST 15 - 41 U/L 13(L) 15 16  ALT 0 - 44 U/L 21 21 17    Component     Latest Ref Rng & Units 05/07/2016  Hepatitis B Surface Ag     Negative Negative  Hep B Core Ab, Tot     Negative Negative  Hep C Virus Ab     0.0 - 0.9 s/co ratio <0.1   . Lab Results  Component Value Date   LDH 182 09/18/2018         RADIOGRAPHIC STUDIES:   ASSESSMENT & PLAN:   73 y.o. 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  Initial PET/CT showed Scattered small mildly hypermetabolic lymph nodes within the neck, left hilum, left axilla and left groin, potentially related the patient's lymphoma. Focal hypermetabolic activity within the left sternal manubrium and left iliac bone, also potentially related to the patient's lymphoma. No evidence of solid visceral organ involvement.  CT guided bone marrow biopsy done -- no  evidence of lymphoma involving Bone marrow. Normal LDH. No constitutional symptoms. Patient has no significant medical comorbidities at baseline. ECHO with nl EF as expected.  Patient has completed 6 cycles of R CHOP PET/CT s/p 6 cycles of R-CHOP shows complete metabolic response CT chest/abd/pelvis 07/07/2017 - showed no radipgraphic evidence of lymphoma progression at this time.  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 No issues with dexamethasone pretreatment -Patient will continue dexamethasone premedication to reduce risk of Rituxan hypersensitivity.  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 Other blood counts and chemistries are stable. -09/18/18 LDH is normal at 182 -The pt has no prohibitive toxicities from continuing her twelfth cycle of maintenance Rituxan, with dexamethasone prior to infusion, at this time. -Discussed that as the patient has now completed 2 years of maintenance Rituxan, there is an option to continue treatment for an additional year or until progression. Discussed risks vs benefits of developing resistance to Rituxan over time and risk of infections with continuing maintenance Rituxan -Discussed that the pt has the option to hold maintenance Rituxan at this time, given the [place] pandemic and the associated immunosuppression. She would like to continue with treatment. -Continue maintenance Vitamin D three times a week, last count from 3/12/0 at 27.38 -No live attenuated virus vaccines -Advised crowd avoidance, and recommended infection prevention precautions. She is up to date on her vaccinations. -Continue age appropriate cancer screening and mammograms with PCP -Will complete repeat CT scan prior to next visit as pt has now completed 2 years of maintenance Rituxan and to evaluate for new baseline -Will see the pt back in 2 months   CT Chest, Abdomen and Pelvis in 7 weeks RTC with Dr Irene Limbo in 8 weeks with labs and next maintenance Rituxan appointment   No orders of the defined types were placed in this encounter.   All of the patients questions were answered with apparent satisfaction. The patient knows to call the clinic with any problems, questions or concerns.  The total time spent in the appt was  25 minutes and more than 50% was on counseling and direct patient cares.   Sullivan Lone MD Baldwin AAHIVMS [facility] Hematology/Oncology Physician [facility]  (Office):       [phone] (Work cell):  [phone] (Fax):           [phone]  I, Baldwin [place], am acting as a scribe for Dr. Sullivan Lone.   .I have reviewed the above documentation for accuracy and completeness, and I agree with the above. Brunetta Genera MD

## 2018-09-18 ENCOUNTER — Telehealth: Payer: Self-pay | Admitting: Hematology

## 2018-09-18 ENCOUNTER — Inpatient Hospital Stay (HOSPITAL_BASED_OUTPATIENT_CLINIC_OR_DEPARTMENT_OTHER): Payer: Medicare Other | Admitting: Hematology

## 2018-09-18 ENCOUNTER — Other Ambulatory Visit: Payer: Self-pay

## 2018-09-18 ENCOUNTER — Inpatient Hospital Stay: Payer: Medicare Other

## 2018-09-18 ENCOUNTER — Inpatient Hospital Stay: Payer: Medicare Other | Attending: Hematology

## 2018-09-18 VITALS — BP 108/85 | HR 64 | Temp 97.8°F | Resp 17 | Ht 69.0 in | Wt 163.0 lb

## 2018-09-18 VITALS — BP 106/63 | HR 57 | Temp 98.2°F | Resp 16

## 2018-09-18 DIAGNOSIS — Z5112 Encounter for antineoplastic immunotherapy: Secondary | ICD-10-CM | POA: Insufficient documentation

## 2018-09-18 DIAGNOSIS — G620 Drug-induced polyneuropathy: Secondary | ICD-10-CM

## 2018-09-18 DIAGNOSIS — C8298 Follicular lymphoma, unspecified, lymph nodes of multiple sites: Secondary | ICD-10-CM

## 2018-09-18 DIAGNOSIS — Z7189 Other specified counseling: Secondary | ICD-10-CM

## 2018-09-18 LAB — CBC WITH DIFFERENTIAL/PLATELET
Abs Immature Granulocytes: 0.03 10*3/uL (ref 0.00–0.07)
Basophils Absolute: 0 10*3/uL (ref 0.0–0.1)
Basophils Relative: 0 %
Eosinophils Absolute: 0 10*3/uL (ref 0.0–0.5)
Eosinophils Relative: 0 %
HCT: 38.2 % (ref 36.0–46.0)
Hemoglobin: 12.9 g/dL (ref 12.0–15.0)
Immature Granulocytes: 0 %
Lymphocytes Relative: 3 %
Lymphs Abs: 0.3 10*3/uL — ABNORMAL LOW (ref 0.7–4.0)
MCH: 30.7 pg (ref 26.0–34.0)
MCHC: 33.8 g/dL (ref 30.0–36.0)
MCV: 91 fL (ref 80.0–100.0)
Monocytes Absolute: 0.5 10*3/uL (ref 0.1–1.0)
Monocytes Relative: 5 %
Neutro Abs: 10 10*3/uL — ABNORMAL HIGH (ref 1.7–7.7)
Neutrophils Relative %: 92 %
Platelets: 271 10*3/uL (ref 150–400)
RBC: 4.2 MIL/uL (ref 3.87–5.11)
RDW: 13 % (ref 11.5–15.5)
WBC: 10.9 10*3/uL — ABNORMAL HIGH (ref 4.0–10.5)
nRBC: 0 % (ref 0.0–0.2)

## 2018-09-18 LAB — CMP (CANCER CENTER ONLY)
ALT: 21 U/L (ref 0–44)
AST: 13 U/L — ABNORMAL LOW (ref 15–41)
Albumin: 4 g/dL (ref 3.5–5.0)
Alkaline Phosphatase: 116 U/L (ref 38–126)
Anion gap: 10 (ref 5–15)
BUN: 15 mg/dL (ref 8–23)
CO2: 22 mmol/L (ref 22–32)
Calcium: 9.6 mg/dL (ref 8.9–10.3)
Chloride: 107 mmol/L (ref 98–111)
Creatinine: 0.68 mg/dL (ref 0.44–1.00)
GFR, Est AFR Am: >60 mL/min (ref >60)
GFR, Estimated: >60 mL/min (ref >60)
Glucose, Bld: 126 mg/dL — ABNORMAL HIGH (ref 70–99)
Potassium: 3.8 mmol/L (ref 3.5–5.1)
Sodium: 139 mmol/L (ref 135–145)
Total Bilirubin: 0.5 mg/dL (ref 0.3–1.2)
Total Protein: 6.5 g/dL (ref 6.5–8.1)

## 2018-09-18 LAB — LACTATE DEHYDROGENASE: LDH: 182 U/L (ref 98–192)

## 2018-09-18 MED ORDER — SODIUM CHLORIDE 0.9 % IV SOLN
12.0000 mg | Freq: Once | INTRAVENOUS | Status: AC
  Administered 2018-09-18: 12 mg via INTRAVENOUS
  Filled 2018-09-18: qty 1.2

## 2018-09-18 MED ORDER — SODIUM CHLORIDE 0.9 % IV SOLN
375.0000 mg/m2 | Freq: Once | INTRAVENOUS | Status: AC
  Administered 2018-09-18: 700 mg via INTRAVENOUS
  Filled 2018-09-18: qty 50

## 2018-09-18 MED ORDER — SODIUM CHLORIDE 0.9 % IV SOLN
Freq: Once | INTRAVENOUS | Status: AC
  Administered 2018-09-18: 11:00:00 via INTRAVENOUS
  Filled 2018-09-18: qty 250

## 2018-09-18 MED ORDER — ACETAMINOPHEN 325 MG PO TABS
ORAL_TABLET | ORAL | Status: AC
  Filled 2018-09-18: qty 2

## 2018-09-18 MED ORDER — DIPHENHYDRAMINE HCL 25 MG PO CAPS
50.0000 mg | ORAL_CAPSULE | Freq: Once | ORAL | Status: AC
  Administered 2018-09-18: 50 mg via ORAL

## 2018-09-18 MED ORDER — DIPHENHYDRAMINE HCL 25 MG PO CAPS
ORAL_CAPSULE | ORAL | Status: AC
  Filled 2018-09-18: qty 2

## 2018-09-18 MED ORDER — DEXAMETHASONE SODIUM PHOSPHATE 10 MG/ML IJ SOLN
INTRAMUSCULAR | Status: AC
  Filled 2018-09-18: qty 2

## 2018-09-18 MED ORDER — ACETAMINOPHEN 325 MG PO TABS
650.0000 mg | ORAL_TABLET | Freq: Once | ORAL | Status: AC
  Administered 2018-09-18: 650 mg via ORAL

## 2018-09-18 NOTE — Patient Instructions (Signed)
Rituximab injection  What is this medicine?  RITUXIMAB (ri TUX i mab) is a monoclonal antibody. It is used to treat certain types of cancer like non-Hodgkin lymphoma and chronic lymphocytic leukemia. It is also used to treat rheumatoid arthritis, granulomatosis with polyangiitis (or Wegener's granulomatosis), microscopic polyangiitis, and pemphigus vulgaris.  This medicine may be used for other purposes; ask your health care provider or pharmacist if you have questions.  COMMON BRAND NAME(S): Rituxan  What should I tell my health care provider before I take this medicine?  They need to know if you have any of these conditions:  -heart disease  -infection (especially a virus infection such as hepatitis B, chickenpox, cold sores, or herpes)  -immune system problems  -irregular heartbeat  -kidney disease  -low blood counts, like low white cell, platelet, or red cell counts  -lung or breathing disease, like asthma  -recently received or scheduled to receive a vaccine  -an unusual or allergic reaction to rituximab, other medicines, foods, dyes, or preservatives  -pregnant or trying to get pregnant  -breast-feeding  How should I use this medicine?  This medicine is for infusion into a vein. It is administered in a hospital or clinic by a specially trained health care professional.  A special MedGuide will be given to you by the pharmacist with each prescription and refill. Be sure to read this information carefully each time.  Talk to your pediatrician regarding the use of this medicine in children. This medicine is not approved for use in children.  Overdosage: If you think you have taken too much of this medicine contact a poison control center or emergency room at once.  NOTE: This medicine is only for you. Do not share this medicine with others.  What if I miss a dose?  It is important not to miss a dose. Call your doctor or health care professional if you are unable to keep an appointment.  What may interact with  this medicine?  -cisplatin  -live virus vaccines  This list may not describe all possible interactions. Give your health care provider a list of all the medicines, herbs, non-prescription drugs, or dietary supplements you use. Also tell them if you smoke, drink alcohol, or use illegal drugs. Some items may interact with your medicine.  What should I watch for while using this medicine?  Your condition will be monitored carefully while you are receiving this medicine. You may need blood work done while you are taking this medicine.  This medicine can cause serious allergic reactions. To reduce your risk you may need to take medicine before treatment with this medicine. Take your medicine as directed.  In some patients, this medicine may cause a serious brain infection that may cause death. If you have any problems seeing, thinking, speaking, walking, or standing, tell your healthcare professional right away. If you cannot reach your healthcare professional, urgently seek other source of medical care.  Call your doctor or health care professional for advice if you get a fever, chills or sore throat, or other symptoms of a cold or flu. Do not treat yourself. This drug decreases your body's ability to fight infections. Try to avoid being around people who are sick.  Do not become pregnant while taking this medicine or for 12 months after stopping it. Women should inform their doctor if they wish to become pregnant or think they might be pregnant. There is a potential for serious side effects to an unborn child. Talk to your health   care professional or pharmacist for more information. Do not breast-feed an infant while taking this medicine or for 6 months after stopping it.  What side effects may I notice from receiving this medicine?  Side effects that you should report to your doctor or health care professional as soon as possible:  -allergic reactions like skin rash, itching or hives; swelling of the face, lips, or  tongue  -breathing problems  -chest pain  -changes in vision  -diarrhea  -headache with fever, neck stiffness, sensitivity to light, nausea, or confusion  -fast, irregular heartbeat  -loss of memory  -low blood counts - this medicine may decrease the number of white blood cells, red blood cells and platelets. You may be at increased risk for infections and bleeding.  -mouth sores  -problems with balance, talking, or walking  -redness, blistering, peeling or loosening of the skin, including inside the mouth  -signs of infection - fever or chills, cough, sore throat, pain or difficulty passing urine  -signs and symptoms of kidney injury like trouble passing urine or change in the amount of urine  -signs and symptoms of liver injury like dark yellow or brown urine; general ill feeling or flu-like symptoms; light-colored stools; loss of appetite; nausea; right upper belly pain; unusually weak or tired; yellowing of the eyes or skin  -signs and symptoms of low blood pressure like dizziness; feeling faint or lightheaded, falls; unusually weak or tired  -stomach pain  -swelling of the ankles, feet, hands  -unusual bleeding or bruising  -vomiting  Side effects that usually do not require medical attention (report to your doctor or health care professional if they continue or are bothersome):  -headache  -joint pain  -muscle cramps or muscle pain  -nausea  -tiredness  This list may not describe all possible side effects. Call your doctor for medical advice about side effects. You may report side effects to FDA at 1-800-FDA-1088.  Where should I keep my medicine?  This drug is given in a hospital or clinic and will not be stored at home.  NOTE: This sheet is a summary. It may not cover all possible information. If you have questions about this medicine, talk to your doctor, pharmacist, or health care provider.   2019 Elsevier/Gold Standard (2017-04-01 13:04:32)

## 2018-09-18 NOTE — Telephone Encounter (Signed)
Scheduled appt per 5/18 los. ° °A calendar will be mailed out. °

## 2018-11-06 ENCOUNTER — Ambulatory Visit (HOSPITAL_COMMUNITY)
Admission: RE | Admit: 2018-11-06 | Discharge: 2018-11-06 | Disposition: A | Discharge status: Home / Self Care | Payer: Medicare Other | Source: Ambulatory Visit | Attending: Hematology | Admitting: Hematology

## 2018-11-06 ENCOUNTER — Encounter (HOSPITAL_COMMUNITY): Payer: Self-pay

## 2018-11-06 ENCOUNTER — Other Ambulatory Visit: Payer: Self-pay

## 2018-11-06 ENCOUNTER — Inpatient Hospital Stay: Payer: Medicare Other | Attending: Hematology

## 2018-11-06 DIAGNOSIS — R911 Solitary pulmonary nodule: Secondary | ICD-10-CM | POA: Diagnosis not present

## 2018-11-06 DIAGNOSIS — C8298 Follicular lymphoma, unspecified, lymph nodes of multiple sites: Secondary | ICD-10-CM

## 2018-11-06 DIAGNOSIS — Z5112 Encounter for antineoplastic immunotherapy: Secondary | ICD-10-CM | POA: Insufficient documentation

## 2018-11-06 DIAGNOSIS — K7689 Other specified diseases of liver: Secondary | ICD-10-CM | POA: Diagnosis not present

## 2018-11-06 LAB — CBC WITH DIFFERENTIAL/PLATELET
Abs Immature Granulocytes: 0.01 10*3/uL (ref 0.00–0.07)
Basophils Absolute: 0 10*3/uL (ref 0.0–0.1)
Basophils Relative: 1 %
Eosinophils Absolute: 0.2 10*3/uL (ref 0.0–0.5)
Eosinophils Relative: 4 %
HCT: 41.9 % (ref 36.0–46.0)
Hemoglobin: 14.3 g/dL (ref 12.0–15.0)
Immature Granulocytes: 0 %
Lymphocytes Relative: 22 %
Lymphs Abs: 1.1 10*3/uL (ref 0.7–4.0)
MCH: 31.4 pg (ref 26.0–34.0)
MCHC: 34.1 g/dL (ref 30.0–36.0)
MCV: 92.1 fL (ref 80.0–100.0)
Monocytes Absolute: 0.5 10*3/uL (ref 0.1–1.0)
Monocytes Relative: 11 %
Neutro Abs: 3.1 10*3/uL (ref 1.7–7.7)
Neutrophils Relative %: 62 %
Platelets: 254 10*3/uL (ref 150–400)
RBC: 4.55 MIL/uL (ref 3.87–5.11)
RDW: 12.5 % (ref 11.5–15.5)
WBC: 5 10*3/uL (ref 4.0–10.5)
nRBC: 0 % (ref 0.0–0.2)

## 2018-11-06 LAB — CMP (CANCER CENTER ONLY)
ALT: 23 U/L (ref 0–44)
AST: 18 U/L (ref 15–41)
Albumin: 4.1 g/dL (ref 3.5–5.0)
Alkaline Phosphatase: 127 U/L — ABNORMAL HIGH (ref 38–126)
Anion gap: 11 (ref 5–15)
BUN: 14 mg/dL (ref 8–23)
CO2: 22 mmol/L (ref 22–32)
Calcium: 9.3 mg/dL (ref 8.9–10.3)
Chloride: 106 mmol/L (ref 98–111)
Creatinine: 0.75 mg/dL (ref 0.44–1.00)
GFR, Est AFR Am: >60 mL/min (ref >60)
GFR, Estimated: >60 mL/min (ref >60)
Glucose, Bld: 93 mg/dL (ref 70–99)
Potassium: 4.1 mmol/L (ref 3.5–5.1)
Sodium: 139 mmol/L (ref 135–145)
Total Bilirubin: 0.5 mg/dL (ref 0.3–1.2)
Total Protein: 6.7 g/dL (ref 6.5–8.1)

## 2018-11-06 LAB — LACTATE DEHYDROGENASE: LDH: 189 U/L (ref 98–192)

## 2018-11-06 MED ORDER — SODIUM CHLORIDE (PF) 0.9 % IJ SOLN
INTRAMUSCULAR | Status: AC
  Filled 2018-11-06: qty 50

## 2018-11-06 MED ORDER — IOHEXOL 300 MG/ML  SOLN
100.0000 mL | Freq: Once | INTRAMUSCULAR | Status: AC | PRN
  Administered 2018-11-06: 100 mL via INTRAVENOUS

## 2018-11-09 NOTE — Progress Notes (Signed)
[name]     HEMATOLOGY/ONCOLOGY CLINIC NOTE  Date of Service:  11/09/18     Patient Care Team: Lindsay Lukes, MD as PCP - General (Family Medicine) [name] M.D. (ENT)  CHIEF COMPLAINTS/PURPOSE OF CONSULTATION:   F/u for follicular lymphoma/DLBCL  HISTORY OF PRESENTING ILLNESS:   Please see previous note for details of initial presentation.  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 She is here for next dose of maintenance Rituxan. The patient's last visit with [place] was on 09/18/2018. The pt rehas comports that she is doing well overall.  The patient reports walking 7 miles/day on average.  Of note since the patient's last visit, pt has had CT chest, abdomen, and pelvis w contrast completed on 11/06/2018 with results revealing "no evidence of recurrent lymphoma, aortic atherosclerosis."  Most recent lab results from (11/06/2018) of CBC w/diff and CMP is as follows: all values are WNL except for Alkaline Phosphatase at 127. Lactate Dehydrogenase form 11/06/2018 at 189  On review of systems, pt reports that she walking 7 miles per day and denies concerns of infections and any other symptoms.   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   Grief reaction 01/27/2016   H/O measles    as a child   History of chicken pox    as a child   History of PCOS    Hyperlipidemia, mixed 01/27/2016   Lymphadenopathy    Peripheral neuropathy 01/31/2017   Preventative health care 01/27/2016   Vitamin D deficiency    Wears glasses     SURGICAL HISTORY: Past Surgical History:  Procedure Laterality Date   COLONOSCOPY     IR GENERIC HISTORICAL  05/21/2016   IR FLUORO GUIDE PORT INSERTION RIGHT 05/21/2016 Arne Cleveland, MD [facility]   IR  GENERIC HISTORICAL  05/21/2016   IR US GUIDE VASC ACCESS RIGHT 05/21/2016 Arne Cleveland, MD [facility]   IR REMOVAL TUN ACCESS W/ PORT W/O FL MOD SED  10/27/2016   SKIN BIOPSY Left    face   THYROGLOSSAL DUCT CYST N/A 04/16/2016   Procedure: THYROGLOSSAL DUCT CYST excision;  Surgeon: [name], MD;  Location[facility];  Service: ENT;  Laterality: N/A;    SOCIAL HISTORY: Social History   Socioeconomic History   Marital status: Widowed    Spouse name: Not on file   Number of children: Not on file   Years of education: Not on file   Highest education level: Not on file  Occupational History   Occupation: Psychotherapist  Social Needs   Financial resource strain: Not on file   Food insecurity    Worry: Not on file    Inability: Not on file   Transportation needs    Medical: Not on file    Non-medical: Not on file  Tobacco Use   Smoking status: Never Smoker   Smokeless tobacco: Never Used  Substance and Sexual Activity   Alcohol use: Yes    Alcohol/week: 3.0 - 6.0 standard drinks    Types: 3 - 6 Standard drinks or equivalent per week    Comment: 5 glasses of wine a week.   Drug use: No   Sexual activity: Never    Partners: Male  Birth control/protection: None  Lifestyle   Physical activity    Days per week: Not on file    Minutes per session: Not on file   Stress: Not on file  Relationships   Social connections    Talks on phone: Not on file    Gets together: Not on file    Attends religious service: Not on file    Active member of club or organization: Not on file    Attends meetings of clubs or organizations: Not on file    Relationship status: Not on file   Intimate partner violence    Fear of current or ex partner: Not on file    Emotionally abused: Not on file    Physically abused: Not on file    Forced sexual activity: Not on file  Other Topics Concern   Not on file  Social History Narrative   Married. Education: [employer].  Exercise: walking, yoga, and bicycling daily for 1-2 hours. Lives alone, works as Management consultant    FAMILY HISTORY: Family History  Problem Relation Age of Onset   Arthritis Mother        rheumatoid   Heart disease Father        CHF   COPD Sister        Emphysema, h/o cigarettes   Other Sister        pituatary tumor   Arthritis Sister    Obesity Sister    Cancer Paternal Grandfather        cancer   Stroke Maternal Aunt     ALLERGIES:  is allergic to erythromycin and other.  MEDICATIONS:  Current Outpatient Medications  Medication Sig Dispense Refill   Calcium-Magnesium-Vitamin D (CALCIUM MAGNESIUM PO) Take 1 tablet by mouth daily.     Cholecalciferol (VITAMIN D) 2000 units CAPS Take 2,000 Units by mouth daily.     dexamethasone (DECADRON) 4 MG tablet TAKE 3 TABLETS BY MOUTH TWICE DAILY, TAKE 1 DOSE IN THE MORNING AND 1 DOSE IN THE EVENING THE DAY PRIOR TO Rituxan 12 tablet 6   Omega-3 Fatty Acids (OMEGA-3 PO) Take 2 capsules by mouth daily. Omega 3 1280 mg     Vitamin D, Ergocalciferol, (DRISDOL) 1.25 MG (50000 UT) CAPS capsule Take 1 capsule (50,000 Units total) by mouth every 7 (seven) days. 4 capsule 4   No current facility-administered medications for this visit.     REVIEW OF SYSTEMS:    A 10+ POINT REVIEW OF SYSTEMS WAS OBTAINED including neurology, dermatology, psychiatry, cardiac, respiratory, lymph, extremities, GI, GU, Musculoskeletal, constitutional, breasts, reproductive, HEENT.  All pertinent positives are noted in the HPI.  All others are negative.   PHYSICAL EXAMINATION:  ECOG PERFORMANCE STATUS: 0 - Asymptomatic  Vitals:   11/14/18 0900  BP: 123/66  Pulse: 63  Resp: 18  Temp: 98.5 F (36.9 C)  SpO2: 97%   Filed Weights   11/14/18 0900  Weight: 159 lb 4.8 oz (72.3 kg)   .There is no height or weight on file to calculate BMI.  GENERAL:alert, in no acute distress and comfortable SKIN: no acute rashes, no significant lesions EYES:  conjunctiva are pink and non-injected, sclera anicteric OROPHARYNX: MMM, no exudates, no oropharyngeal erythema or ulceration NECK: supple, no JVD LYMPH:  no palpable lymphadenopathy in the cervical, axillary or inguinal regions LUNGS: clear to auscultation b/l with normal respiratory effort HEART: regular rate & rhythm ABDOMEN:  normoactive bowel sounds , non tender, not distended. No palpable hepatosplenomegaly.  Extremity: no pedal edema  PSYCH: alert & oriented x 3 with fluent speech NEURO: no focal motor/sensory deficits   LABORATORY DATA:  I have reviewed the data as listed  . CBC Latest Ref Rng & Units 11/06/2018 09/18/2018 07/24/2018  WBC 4.0 - 10.5 K/uL 5.0 10.9(H) 11.1(H)  Hemoglobin 12.0 - 15.0 g/dL 14.3 12.9 13.8  Hematocrit 36.0 - 46.0 % 41.9 38.2 40.3  Platelets 150 - 400 K/uL 254 271 281   CBC    Component Value Date/Time   WBC 5.0 11/06/2018 0804   RBC 4.55 11/06/2018 0804   HGB 14.3 11/06/2018 0804   HGB 13.9 05/10/2018 0946   HGB 13.3 03/07/2017 0842   HCT 41.9 11/06/2018 0804   HCT 39.1 03/07/2017 0842   PLT 254 11/06/2018 0804   PLT 272 05/10/2018 0946   PLT 283 03/07/2017 0842   MCV 92.1 11/06/2018 0804   MCV 91.4 03/07/2017 0842   MCH 31.4 11/06/2018 0804   MCHC 34.1 11/06/2018 0804   RDW 12.5 11/06/2018 0804   RDW 13.3 03/07/2017 0842   LYMPHSABS 1.1 11/06/2018 0804   LYMPHSABS 0.4 (L) 03/07/2017 0842   MONOABS 0.5 11/06/2018 0804   MONOABS 0.7 03/07/2017 0842   EOSABS 0.2 11/06/2018 0804   EOSABS 0.0 03/07/2017 0842   BASOSABS 0.0 11/06/2018 0804   BASOSABS 0.0 03/07/2017 0842    . CMP Latest Ref Rng & Units 11/06/2018 09/18/2018 07/24/2018  Glucose 70 - 99 mg/dL 93 126(H) 131(H)  BUN 8 - 23 mg/dL _0 Creatinine 0.44 - 1.00 mg/dL 0.75 0.68 0.78  Sodium 135 - 145 mmol/L 139 139 139  Potassium 3.5 - 5.1 mmol/L 4.1 3.8 4.2  Chloride 98 - 111 mmol/L 106 107 104  CO2 22 - 32 mmol/L 22 22 21(L)  Calcium 8.9 - 10.3 mg/dL 9.3 9.6 9.8  Total  Protein 6.5 - 8.1 g/dL 6.7 6.5 7.0  Total Bilirubin 0.3 - 1.2 mg/dL 0.5 0.5 0.5  Alkaline Phos 38 - 126 U/L 127(H) 116 111  AST 15 - 41 U/L 18 13(L) 15  ALT 0 - 44 U/L _1 Component     Latest Ref Rng & Units 05/07/2016  Hepatitis B Surface Ag     Negative Negative  Hep B Core Ab, Tot     Negative Negative  Hep C Virus Ab     0.0 - 0.9 s/co ratio <0.1   . Lab Results  Component Value Date   LDH 189 11/06/2018         RADIOGRAPHIC STUDIES:  CT chest, abdomen, and pelvis w contrast 11/06/2018 CLINICAL DATA:  Non-Hodgkin lymphoma, chemotherapy complete. Immunotherapy in progress.  EXAM: CT CHEST, ABDOMEN, AND PELVIS WITH CONTRAST  TECHNIQUE: Multidetector CT imaging of the chest, abdomen and pelvis was performed following the standard protocol during bolus administration of intravenous contrast.  CONTRAST:  140m OMNIPAQUE IOHEXOL 300 MG/ML  SOLN  COMPARISON:  07/07/2017.  FINDINGS: CT CHEST FINDINGS  Cardiovascular: Atherosclerotic calcification of the aorta. Mild stenosis of the proximal right pulmonary artery with poststenotic dilatation. Heart size normal. No pericardial effusion.  Mediastinum/Nodes: No pathologically enlarged mediastinal, hilar or axillary lymph nodes. Esophagus is grossly unremarkable.  Lungs/Pleura: 3 mm right lower lobe nodule (series 6, image 112), unchanged and considered benign. Lungs are otherwise clear. No pleural fluid. Airway is unremarkable.  Musculoskeletal: Degenerative changes in the spine. No worrisome lytic or sclerotic lesions.  CT ABDOMEN PELVIS FINDINGS  Hepatobiliary: Well-circumscribed low-attenuation lesions in the left hepatic lobe measure  up to 1.0 cm, stable and likely cysts. Liver and gallbladder are otherwise unremarkable. No biliary ductal dilatation.  Pancreas: Negative.  Spleen: Negative.  Adrenals/Urinary Tract: Adrenal glands and kidneys are unremarkable. Ureters are  decompressed. Bladder is grossly unremarkable.  Stomach/Bowel: Stomach, small bowel, appendix and colon are unremarkable.  Vascular/Lymphatic: Atherosclerotic calcification of the aorta without aneurysm. Circumaortic left renal vein. No pathologically enlarged lymph nodes.  Reproductive: Uterus is visualized.  No adnexal mass.  Other: No free fluid.  Mesenteries and peritoneum are unremarkable.  Musculoskeletal: Degenerative changes in the spine. No worrisome lytic or sclerotic lesions.  IMPRESSION: 1. No evidence of recurrent lymphoma. 2.  Aortic atherosclerosis (ICD10-170.0). Electronically Signed   By: Lorin Picket M.D.   On: 11/06/2018 11:10  ASSESSMENT & PLAN:   73 y.o. 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  Initial PET/CT showed Scattered small mildly hypermetabolic lymph nodes within the neck, left hilum, left axilla and left groin, potentially related the patient's lymphoma. Focal hypermetabolic activity within the left sternal manubrium and left iliac bone, also potentially related to the patient's lymphoma. No evidence of solid visceral organ involvement.  CT guided bone marrow biopsy done -- no evidence of lymphoma involving Bone marrow. Normal LDH. No constitutional symptoms. Patient has no significant medical comorbidities at baseline. ECHO with nl EF as expected.  Patient has completed 6 cycles of R CHOP PET/CT s/p 6 cycles of R-CHOP shows complete metabolic response CT chest/abd/pelvis 07/07/2017 - showed no radipgraphic evidence of lymphoma progression at this time.  Lab Results  Component Value Date   LDH 189 11/06/2018   2) Rituxan hypersensitivity (mild grade 1 hives on the back ) - resolved with solumedrol and antihistamines. No issues with dexamethasone pretreatment -Patient will continue dexamethasone premedication to reduce  risk of Rituxan hypersensitivity.  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 LDH normal. -Discussed CT scan from 11/06/2018 that suggested no signs of active lymphoma. No enlarged lymph nodes, no splenomegaly. Nothing that looks like recurrent lyphoma. -Discussed aortic atherosclerosis revealed in 11/06/2018 CT. Recommended healthy diet and exercise.  -Discussed that as the patient has now completed 2 years of maintenance Rituxan, there is an option to continue treatment for an additional year or until progression. Discussed risks vs benefits of developing resistance to Rituxan over time and risk of infections with continuing maintenance Rituxan -Discussed that the pt has the option to hold maintenance Rituxan at this time, given the [place] pandemic and the associated immunosuppression. She would like to continue with treatment. -Continue maintenance Vitamin D three times a week, last count from 3/12/0 at 27.38 -Advised crowd avoidance, and recommended infection prevention precautions. She is up to date on her vaccinations. -Continue age appropriate cancer screening and mammograms with PCP -Will see the pt back in 2 months   Please schedule next 2 doses of maintenance Rituxan q60days with labs and MD visits   Orders Placed This Encounter  Procedures   CBC with Differential/Platelet    Standing Status:   Standing    Number of Occurrences:   12    Standing Expiration Date:   11/14/2019   CMP ([facility] only)    Standing Status:   Standing    Number of Occurrences:   12    Standing Expiration Date:  11/14/2019   Lactate dehydrogenase    Standing Status:   Standing    Number of Occurrences:   12    Standing Expiration Date:   11/14/2019    All of the patients questions were answered with apparent satisfaction. The patient knows to call the clinic with any  problems, questions or concerns.  The total time spent in the appt was 25 minutes and more than 50% was on counseling and direct patient cares.  Sullivan Lone MD Lindsay AAHIVMS [facility] Hematology/Oncology Physician [place]  (Office):       [phone] (Work cell):  [phone] (Fax):           [phone]  By signing my name below, I, De Burrs, attest that this documentation has been prepared under the direction and in the presence of Irene Limbo, Cloria Spring, MD. Electronically Signed: De Burrs, Medical Scribe. 11/09/18. 11:47 AM.  .I have reviewed the above documentation for accuracy and completeness, and I agree with the above. Brunetta Genera MD

## 2018-11-13 ENCOUNTER — Ambulatory Visit: Payer: Medicare Other

## 2018-11-13 ENCOUNTER — Ambulatory Visit: Payer: Medicare Other | Admitting: Hematology

## 2018-11-14 ENCOUNTER — Inpatient Hospital Stay (HOSPITAL_BASED_OUTPATIENT_CLINIC_OR_DEPARTMENT_OTHER): Payer: Medicare Other | Admitting: Hematology

## 2018-11-14 ENCOUNTER — Inpatient Hospital Stay: Payer: Medicare Other

## 2018-11-14 ENCOUNTER — Telehealth: Payer: Self-pay | Admitting: Hematology

## 2018-11-14 ENCOUNTER — Other Ambulatory Visit: Payer: Self-pay

## 2018-11-14 VITALS — BP 123/66 | HR 63 | Temp 98.5°F | Resp 18 | Ht 69.0 in | Wt 159.3 lb

## 2018-11-14 VITALS — BP 98/53 | HR 48 | Temp 98.3°F | Resp 16

## 2018-11-14 DIAGNOSIS — C8298 Follicular lymphoma, unspecified, lymph nodes of multiple sites: Secondary | ICD-10-CM | POA: Diagnosis not present

## 2018-11-14 DIAGNOSIS — Z5112 Encounter for antineoplastic immunotherapy: Secondary | ICD-10-CM

## 2018-11-14 DIAGNOSIS — C8330 Diffuse large B-cell lymphoma, unspecified site: Secondary | ICD-10-CM

## 2018-11-14 DIAGNOSIS — Z7189 Other specified counseling: Secondary | ICD-10-CM

## 2018-11-14 MED ORDER — DIPHENHYDRAMINE HCL 25 MG PO CAPS
ORAL_CAPSULE | ORAL | Status: AC
  Filled 2018-11-14: qty 2

## 2018-11-14 MED ORDER — ACETAMINOPHEN 325 MG PO TABS
ORAL_TABLET | ORAL | Status: AC
  Filled 2018-11-14: qty 2

## 2018-11-14 MED ORDER — SODIUM CHLORIDE 0.9 % IV SOLN
12.0000 mg | Freq: Once | INTRAVENOUS | Status: AC
  Administered 2018-11-14: 12 mg via INTRAVENOUS
  Filled 2018-11-14: qty 1.2

## 2018-11-14 MED ORDER — SODIUM CHLORIDE 0.9 % IV SOLN
375.0000 mg/m2 | Freq: Once | INTRAVENOUS | Status: AC
  Administered 2018-11-14: 700 mg via INTRAVENOUS
  Filled 2018-11-14: qty 20

## 2018-11-14 MED ORDER — ACETAMINOPHEN 325 MG PO TABS
650.0000 mg | ORAL_TABLET | Freq: Once | ORAL | Status: AC
  Administered 2018-11-14: 650 mg via ORAL

## 2018-11-14 MED ORDER — SODIUM CHLORIDE 0.9 % IV SOLN
Freq: Once | INTRAVENOUS | Status: AC
  Administered 2018-11-14: 11:00:00 via INTRAVENOUS
  Filled 2018-11-14: qty 250

## 2018-11-14 MED ORDER — DIPHENHYDRAMINE HCL 25 MG PO CAPS
50.0000 mg | ORAL_CAPSULE | Freq: Once | ORAL | Status: AC
  Administered 2018-11-14: 11:00:00 50 mg via ORAL

## 2018-11-14 NOTE — Progress Notes (Signed)
Per Dr. Irene Limbo: Ok to use lab work from 11/06/2018 for today's treatment parameters.

## 2018-11-14 NOTE — Patient Instructions (Signed)
Rituximab injection  What is this medicine?  RITUXIMAB (ri TUX i mab) is a monoclonal antibody. It is used to treat certain types of cancer like non-Hodgkin lymphoma and chronic lymphocytic leukemia. It is also used to treat rheumatoid arthritis, granulomatosis with polyangiitis (or Wegener's granulomatosis), microscopic polyangiitis, and pemphigus vulgaris.  This medicine may be used for other purposes; ask your health care provider or pharmacist if you have questions.  COMMON BRAND NAME(S): Rituxan  What should I tell my health care provider before I take this medicine?  They need to know if you have any of these conditions:  -heart disease  -infection (especially a virus infection such as hepatitis B, chickenpox, cold sores, or herpes)  -immune system problems  -irregular heartbeat  -kidney disease  -low blood counts, like low white cell, platelet, or red cell counts  -lung or breathing disease, like asthma  -recently received or scheduled to receive a vaccine  -an unusual or allergic reaction to rituximab, other medicines, foods, dyes, or preservatives  -pregnant or trying to get pregnant  -breast-feeding  How should I use this medicine?  This medicine is for infusion into a vein. It is administered in a hospital or clinic by a specially trained health care professional.  A special MedGuide will be given to you by the pharmacist with each prescription and refill. Be sure to read this information carefully each time.  Talk to your pediatrician regarding the use of this medicine in children. This medicine is not approved for use in children.  Overdosage: If you think you have taken too much of this medicine contact a poison control center or emergency room at once.  NOTE: This medicine is only for you. Do not share this medicine with others.  What if I miss a dose?  It is important not to miss a dose. Call your doctor or health care professional if you are unable to keep an appointment.  What may interact with  this medicine?  -cisplatin  -live virus vaccines  This list may not describe all possible interactions. Give your health care provider a list of all the medicines, herbs, non-prescription drugs, or dietary supplements you use. Also tell them if you smoke, drink alcohol, or use illegal drugs. Some items may interact with your medicine.  What should I watch for while using this medicine?  Your condition will be monitored carefully while you are receiving this medicine. You may need blood work done while you are taking this medicine.  This medicine can cause serious allergic reactions. To reduce your risk you may need to take medicine before treatment with this medicine. Take your medicine as directed.  In some patients, this medicine may cause a serious brain infection that may cause death. If you have any problems seeing, thinking, speaking, walking, or standing, tell your healthcare professional right away. If you cannot reach your healthcare professional, urgently seek other source of medical care.  Call your doctor or health care professional for advice if you get a fever, chills or sore throat, or other symptoms of a cold or flu. Do not treat yourself. This drug decreases your body's ability to fight infections. Try to avoid being around people who are sick.  Do not become pregnant while taking this medicine or for 12 months after stopping it. Women should inform their doctor if they wish to become pregnant or think they might be pregnant. There is a potential for serious side effects to an unborn child. Talk to your health   care professional or pharmacist for more information. Do not breast-feed an infant while taking this medicine or for 6 months after stopping it.  What side effects may I notice from receiving this medicine?  Side effects that you should report to your doctor or health care professional as soon as possible:  -allergic reactions like skin rash, itching or hives; swelling of the face, lips, or  tongue  -breathing problems  -chest pain  -changes in vision  -diarrhea  -headache with fever, neck stiffness, sensitivity to light, nausea, or confusion  -fast, irregular heartbeat  -loss of memory  -low blood counts - this medicine may decrease the number of white blood cells, red blood cells and platelets. You may be at increased risk for infections and bleeding.  -mouth sores  -problems with balance, talking, or walking  -redness, blistering, peeling or loosening of the skin, including inside the mouth  -signs of infection - fever or chills, cough, sore throat, pain or difficulty passing urine  -signs and symptoms of kidney injury like trouble passing urine or change in the amount of urine  -signs and symptoms of liver injury like dark yellow or brown urine; general ill feeling or flu-like symptoms; light-colored stools; loss of appetite; nausea; right upper belly pain; unusually weak or tired; yellowing of the eyes or skin  -signs and symptoms of low blood pressure like dizziness; feeling faint or lightheaded, falls; unusually weak or tired  -stomach pain  -swelling of the ankles, feet, hands  -unusual bleeding or bruising  -vomiting  Side effects that usually do not require medical attention (report to your doctor or health care professional if they continue or are bothersome):  -headache  -joint pain  -muscle cramps or muscle pain  -nausea  -tiredness  This list may not describe all possible side effects. Call your doctor for medical advice about side effects. You may report side effects to FDA at 1-800-FDA-1088.  Where should I keep my medicine?  This drug is given in a hospital or clinic and will not be stored at home.  NOTE: This sheet is a summary. It may not cover all possible information. If you have questions about this medicine, talk to your doctor, pharmacist, or health care provider.   2019 Elsevier/Gold Standard (2017-04-01 13:04:32)

## 2018-11-14 NOTE — Telephone Encounter (Signed)
Scheduled appt per 7/14 los.

## 2018-12-07 ENCOUNTER — Inpatient Hospital Stay: Payer: Medicare Other | Attending: Hematology | Admitting: Medical

## 2018-12-07 ENCOUNTER — Other Ambulatory Visit: Payer: Self-pay

## 2018-12-07 ENCOUNTER — Telehealth: Payer: Self-pay | Admitting: Emergency Medicine

## 2018-12-07 VITALS — BP 118/73 | HR 71 | Temp 98.5°F | Resp 18 | Ht 69.0 in | Wt 157.8 lb

## 2018-12-07 DIAGNOSIS — E559 Vitamin D deficiency, unspecified: Secondary | ICD-10-CM | POA: Diagnosis not present

## 2018-12-07 DIAGNOSIS — E782 Mixed hyperlipidemia: Secondary | ICD-10-CM | POA: Insufficient documentation

## 2018-12-07 DIAGNOSIS — Z881 Allergy status to other antibiotic agents status: Secondary | ICD-10-CM | POA: Diagnosis not present

## 2018-12-07 DIAGNOSIS — Z8349 Family history of other endocrine, nutritional and metabolic diseases: Secondary | ICD-10-CM | POA: Diagnosis not present

## 2018-12-07 DIAGNOSIS — H1033 Unspecified acute conjunctivitis, bilateral: Secondary | ICD-10-CM

## 2018-12-07 DIAGNOSIS — Z809 Family history of malignant neoplasm, unspecified: Secondary | ICD-10-CM | POA: Insufficient documentation

## 2018-12-07 DIAGNOSIS — Z823 Family history of stroke: Secondary | ICD-10-CM | POA: Insufficient documentation

## 2018-12-07 DIAGNOSIS — C8330 Diffuse large B-cell lymphoma, unspecified site: Secondary | ICD-10-CM | POA: Diagnosis not present

## 2018-12-07 DIAGNOSIS — Z8261 Family history of arthritis: Secondary | ICD-10-CM | POA: Insufficient documentation

## 2018-12-07 DIAGNOSIS — Z79899 Other long term (current) drug therapy: Secondary | ICD-10-CM | POA: Insufficient documentation

## 2018-12-07 DIAGNOSIS — M1990 Unspecified osteoarthritis, unspecified site: Secondary | ICD-10-CM | POA: Diagnosis not present

## 2018-12-07 DIAGNOSIS — Z825 Family history of asthma and other chronic lower respiratory diseases: Secondary | ICD-10-CM | POA: Diagnosis not present

## 2018-12-07 DIAGNOSIS — D649 Anemia, unspecified: Secondary | ICD-10-CM | POA: Diagnosis not present

## 2018-12-07 DIAGNOSIS — Z7289 Other problems related to lifestyle: Secondary | ICD-10-CM | POA: Insufficient documentation

## 2018-12-07 DIAGNOSIS — Z8249 Family history of ischemic heart disease and other diseases of the circulatory system: Secondary | ICD-10-CM | POA: Diagnosis not present

## 2018-12-07 MED ORDER — DEXAMETHASONE 4 MG PO TABS: ORAL_TABLET | ORAL | 6 refills | Status: DC

## 2018-12-07 MED ORDER — NEOMYCIN-POLYMYXIN-HC OP SUSP: OPHTHALMIC | 1 refills | Status: DC

## 2018-12-07 MED FILL — DEXAMETHASONE 4 MG TABLET: 4 | 2 days supply | Qty: 12 | Fill #0

## 2018-12-07 MED FILL — NEOMYCIN/POLY/HC EYE DROPS: 3.5-10000-1 | 6 days supply | Qty: 8 | Fill #0

## 2018-12-07 NOTE — Patient Instructions (Signed)
Viral Conjunctivitis, Adult  Viral conjunctivitis is an inflammation of the clear membrane that covers the white part of your eye and the inner surface of your eyelid (conjunctiva). The inflammation is caused by a viral infection. The blood vessels in the conjunctiva become inflamed, causing the eye to become red or pink, and often itchy. Viral conjunctivitis can be easily passed from one person to another (is contagious). This condition is often called pink eye. What are the causes? This condition is caused by a virus. A virus is a type of contagious germ. It can be spread by touching objects that have been contaminated with the virus, such as doorknobs or towels. It can also be passed through droplets, such as from coughing or sneezing. What are the signs or symptoms? Symptoms of this condition include:  Eye redness.  Tearing or watery eyes.  Itchy and irritated eyes.  Burning feeling in the eyes.  Clear drainage from the eye.  Swollen eyelids.  A gritty feeling in the eye.  Light sensitivity. This condition often occurs with other symptoms, such as a fever, nausea, or a rash. How is this diagnosed? This condition is diagnosed with a medical history and physical exam. If you have discharge from your eye, the discharge may be tested to rule out other causes of conjunctivitis. How is this treated? Viral conjunctivitis does not respond to medicines that kill bacteria (antibiotics). Treatment for viral conjunctivitis is directed at stopping a bacterial infection from developing in addition to the viral infection. Treatment also aims to relieve your symptoms, such as itching. This may be done with antihistamine drops or other eye medicines. Rarely, steroid eye drops or antiviral medicines may be prescribed. Follow these instructions at home: Medicines   Take or apply over-the-counter and prescription medicines only as told by your health care provider.  Be very careful to avoid  touching the edge of the eyelid with the eye drop bottle or ointment tube when applying medicines to the affected eye. Being careful this way will stop you from spreading the infection to the other eye or to other people. Eye care  Avoid touching or rubbing your eyes.  Apply a warm, wet, clean washcloth to your eye for 10-20 minutes, 3-4 times per day or as told by your health care provider.  If you wear contact lenses, do not wear them until the inflammation is gone and your health care provider says it is safe to wear them again. Ask your health care provider how to sterilize or replace your contact lenses before using them again. Wear glasses until you can resume wearing contacts.  Avoid wearing eye makeup until the inflammation is gone. Throw away any old eye cosmetics that may be contaminated.  Gently wipe away any drainage from your eye with a warm, wet washcloth or a cotton ball. General instructions  Change or wash your pillowcase every day or as told by your health care provider.  Do not share towels, pillowcases, washcloths, eye makeup, makeup brushes, contact lenses, or glasses. This may spread the infection.  Wash your hands often with soap and water. Use paper towels to dry your hands. If soap and water are not available, use hand sanitizer.  Try to avoid contact with other people for one week or as told by your health care provider. Contact a health care provider if:  Your symptoms do not improve with treatment or they get worse.  You have increased pain.  Your vision becomes blurry.  You have a   fever.  You have facial pain, redness, or swelling.  You have yellow or green drainage coming from your eye.  You have new symptoms. This information is not intended to replace advice given to you by your health care provider. Make sure you discuss any questions you have with your health care provider. Document Released: 07/10/2002 Document Revised: 08/08/2018 Document  Reviewed: 11/04/2015 Elsevier Patient Education  2020 [employer].

## 2018-12-07 NOTE — Telephone Encounter (Signed)
Returning pt's VM from this am.  Reports over the last few days she has had R eye pain and swelling with drainage and redness.  States "it looks like conjunctivitis but I'm not sure".  Denies any resp symptoms or fever or changes in baseline status otherwise.  High priority scheduling message sent for [facility] appt at 1pm today.  Pt aware of appt date/time.

## 2018-12-07 NOTE — Progress Notes (Signed)
Pt presents to [facility] with R eye issue x3 days.  Reports redness, minor drainage with yellowish/clear color that does not crust/seal the eye shut, itching, and soreness/tenderness of R eye.  Denies burning or changes in vision.  Swelling & redness seen also on upper eyelid.  Afebrile.  No rash or resp symptoms.

## 2018-12-08 NOTE — Progress Notes (Signed)
[facility] Progress Note   Lindsay Harrison [id number] [birth year] 73 y.o.  Lindsay Harrison is managed by Dr. Sullivan Lone  Actively treated with chemotherapy/immunotherapy/hormonal therapy: yes  Current therapy: Maintenance rituximab  Last treated: 11/14/2018 (cycle 13, day 1)  Next scheduled appointment with provider: 01/15/2019  Assessment: Plan:    Acute bilateral conjunctivitis: Patient was given a prescription for polymycin ophthalmic solution.  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  She will return to see Dr. Irene Limbo on 01/15/2019.  Please see After Visit Summary for patient specific instructions.  Future Appointments  Date Time Provider [place]  01/04/2019  8:20 AM [name], MD [facility]  01/15/2019  8:00 AM [facility] LAB 1 [facility] None  01/15/2019  8:40 AM Brunetta Genera, MD [facility] None  01/15/2019  9:30 AM [facility] INFUSION [facility] None  02/15/2019  1:00 PM Dennis Bast, RN [facility]  03/16/2019  8:30 AM [facility] LAB 4 [facility] None  03/16/2019  9:00 AM Brunetta Genera, MD [facility] None  03/16/2019 10:30 AM [facility] INFUSION [facility] None    No orders of the defined types were placed in this encounter.      Subjective:   Patient ID:  Lindsay Harrison is a 73 y.o. (DOB 06-10-1945) female.  Chief Complaint:  Chief Complaint  Patient presents with   Eye Problem    Right    HPI Lindsay Harrison is a 73 year old female with a history of a diffuse large B-cell lymphoma who is managed by Dr. Irene Limbo.  She is currently treated with maintenance rituximab was cycle 13 dosed on 11/14/2018.  She presents to the clinic today with a 3-day history of bilateral itching of her eyes with swelling of her right eye with pain.  She denies fevers, chills, sweats, cough, rhinorrhea, or other  respiratory symptoms.  She has not noticed any drainage.  She denies having her eyelids stuck together.  She has been using warm compresses on her eyes.  Medications: I have reviewed the patient's current medications.  Allergies:  Allergies  Allergen Reactions   Erythromycin Other (See Comments)    Patient states that she passed out while using this medication ? SYNCOPE ?   Other Anaphylaxis    # # # CATS # # #    Past Medical History:  Diagnosis Date   Anemia    h/o iron deficiency secondary to heavy menses   Arthritis    left hand index finger   Diffuse large B cell lymphoma ([place]) dx'd 04/2016   Grief reaction 01/27/2016   H/O measles    as a child   History of chicken pox    as a child   History of PCOS    Hyperlipidemia, mixed 01/27/2016   Lymphadenopathy    Peripheral neuropathy 01/31/2017   Preventative health care 01/27/2016   Vitamin D deficiency    Wears glasses     Past Surgical History:  Procedure Laterality Date   COLONOSCOPY     IR GENERIC HISTORICAL  05/21/2016   IR FLUORO GUIDE PORT INSERTION RIGHT 05/21/2016 Arne Cleveland, MD [facility]   IR GENERIC HISTORICAL  05/21/2016   IR US GUIDE VASC ACCESS RIGHT 05/21/2016 Arne Cleveland, MD [facility]   IR REMOVAL TUN ACCESS W/ PORT W/O FL MOD SED  10/27/2016   SKIN BIOPSY Left    face   THYROGLOSSAL DUCT CYST  N/A 04/16/2016   Procedure: THYROGLOSSAL DUCT CYST excision;  Surgeon: [name], MD;  Location: [facility] OR;  Service: ENT;  Laterality: N/A;    Family History  Problem Relation Age of Onset   Arthritis Mother        rheumatoid   Heart disease Father        CHF   COPD Sister        Emphysema, h/o cigarettes   Other Sister        pituatary tumor   Arthritis Sister    Obesity Sister    Cancer Paternal Grandfather        cancer   Stroke Maternal Aunt     Social History   Socioeconomic History   Marital status: Widowed    Spouse name: Not on file   Number  of children: Not on file   Years of education: Not on file   Highest education level: Not on file  Occupational History   Occupation: Psychotherapist  Social Needs   Financial resource strain: Not on file   Food insecurity    Worry: Not on file    Inability: Not on file   Transportation needs    Medical: Not on file    Non-medical: Not on file  Tobacco Use   Smoking status: Never Smoker   Smokeless tobacco: Never Used  Substance and Sexual Activity   Alcohol use: Yes    Alcohol/week: 3.0 - 6.0 standard drinks    Types: 3 - 6 Standard drinks or equivalent per week    Comment: 5 glasses of wine a week.   Drug use: No   Sexual activity: Never    Partners: Male    Birth control/protection: None  Lifestyle   Physical activity    Days per week: Not on file    Minutes per session: Not on file   Stress: Not on file  Relationships   Social connections    Talks on phone: Not on file    Gets together: Not on file    Attends religious service: Not on file    Active member of club or organization: Not on file    Attends meetings of clubs or organizations: Not on file    Relationship status: Not on file   Intimate partner violence    Fear of current or ex partner: Not on file    Emotionally abused: Not on file    Physically abused: Not on file    Forced sexual activity: Not on file  Other Topics Concern   Not on file  Social History Narrative   Married. Education: [employer]. Exercise: walking, yoga, and bicycling daily for 1-2 hours. Lives alone, works as Management consultant    Past Medical History, Surgical history, Social history, and Family history were reviewed and updated as appropriate.   Please see review of systems for further details on the patient's review from today.   Review of Systems:  Review of Systems  Constitutional: Negative for chills, diaphoresis and fever.  HENT: Negative for trouble swallowing and voice change.   Eyes: Positive for pain,  redness and itching.  Respiratory: Negative for cough, chest tightness, shortness of breath and wheezing.   Cardiovascular: Negative for chest pain and palpitations.  Gastrointestinal: Negative for abdominal pain, constipation, diarrhea, nausea and vomiting.  Musculoskeletal: Negative for back pain and myalgias.  Neurological: Negative for dizziness, light-headedness and headaches.    Objective:   Physical Exam:  BP 118/73 (BP Location: Right Arm, Patient Position: Sitting)  Pulse 71    Temp 98.5 F (36.9 C) (Oral)    Resp 18    Ht 5\' 9"  (1.753 m)    Wt 157 lb 12.8 oz (71.6 kg)    SpO2 99%    BMI 23.30 kg/m  ECOG: 0  Physical Exam Constitutional:      General: She is not in acute distress.    Appearance: She is not diaphoretic.  HENT:     Head: Normocephalic and atraumatic.  Eyes:     Conjunctiva/sclera:     Right eye: Right conjunctiva is injected.     Funduscopic exam:    Right eye: No hemorrhage, exudate, AV nicking, arteriolar narrowing or papilledema.        Left eye: No hemorrhage, exudate, AV nicking, arteriolar narrowing or papilledema.     Comments: Visual acuity without glasses: Right: 20/40 Left: 20/20  The right upper and lower lateral eyelids are swollen with a small pinpoint pustule noted in the right lateral lower lid.   Cardiovascular:     Rate and Rhythm: Normal rate and regular rhythm.     Heart sounds: Normal heart sounds. No murmur. No friction rub. No gallop.   Pulmonary:     Effort: Pulmonary effort is normal. No respiratory distress.     Breath sounds: Normal breath sounds. No wheezing or rales.  Skin:    General: Skin is warm and dry.     Findings: No erythema or rash.  Neurological:     Mental Status: She is alert.     Lab Review:     Component Value Date/Time   NA 139 11/06/2018 0804   NA 137 03/07/2017 0842   K 4.1 11/06/2018 0804   K 4.3 03/07/2017 0842   CL 106 11/06/2018 0804   CO2 22 11/06/2018 0804   CO2 21 (L) 03/07/2017 0842     GLUCOSE 93 11/06/2018 0804   GLUCOSE 123 03/07/2017 0842   BUN 14 11/06/2018 0804   BUN 14.0 03/07/2017 0842   CREATININE 0.75 11/06/2018 0804   CREATININE 0.7 03/07/2017 0842   CALCIUM 9.3 11/06/2018 0804   CALCIUM 10.2 03/07/2017 0842   PROT 6.7 11/06/2018 0804   PROT 7.2 03/07/2017 0842   ALBUMIN 4.1 11/06/2018 0804   ALBUMIN 4.3 03/07/2017 0842   AST 18 11/06/2018 0804   AST 15 03/07/2017 0842   ALT 23 11/06/2018 0804   ALT 25 03/07/2017 0842   ALKPHOS 127 (H) 11/06/2018 0804   ALKPHOS 131 03/07/2017 0842   BILITOT 0.5 11/06/2018 0804   BILITOT 0.45 03/07/2017 0842   GFRNONAA >60 11/06/2018 0804   GFRAA >60 11/06/2018 0804       Component Value Date/Time   WBC 5.0 11/06/2018 0804   RBC 4.55 11/06/2018 0804   HGB 14.3 11/06/2018 0804   HGB 13.9 05/10/2018 0946   HGB 13.3 03/07/2017 0842   HCT 41.9 11/06/2018 0804   HCT 39.1 03/07/2017 0842   PLT 254 11/06/2018 0804   PLT 272 05/10/2018 0946   PLT 283 03/07/2017 0842   MCV 92.1 11/06/2018 0804   MCV 91.4 03/07/2017 0842   MCH 31.4 11/06/2018 0804   MCHC 34.1 11/06/2018 0804   RDW 12.5 11/06/2018 0804   RDW 13.3 03/07/2017 0842   LYMPHSABS 1.1 11/06/2018 0804   LYMPHSABS 0.4 (L) 03/07/2017 0842   MONOABS 0.5 11/06/2018 0804   MONOABS 0.7 03/07/2017 0842   EOSABS 0.2 11/06/2018 0804   EOSABS 0.0 03/07/2017 0842   BASOSABS 0.0 11/06/2018  0804   BASOSABS 0.0 03/07/2017 0842   -------------------------------  Imaging from last 24 hours (if applicable):  Radiology interpretation: No results found.

## 2018-12-21 ENCOUNTER — Encounter: Payer: Self-pay | Admitting: Medical

## 2018-12-21 ENCOUNTER — Encounter: Payer: Self-pay | Admitting: Family Medicine

## 2018-12-28 NOTE — Telephone Encounter (Signed)
I called pt and RS to 11/5 for in office visit. 9/3 visit cancelled.

## 2019-01-03 ENCOUNTER — Encounter: Payer: Self-pay | Admitting: Hematology

## 2019-01-04 ENCOUNTER — Ambulatory Visit: Payer: Medicare Other | Admitting: Family Medicine

## 2019-01-14 NOTE — Progress Notes (Signed)
[name]     HEMATOLOGY/ONCOLOGY CLINIC NOTE  Date of Service:  01/15/19     Patient Care Team: Lindsay Lukes, MD as PCP - General (Family Medicine) Lindsay Harrison M.D. (ENT)  CHIEF COMPLAINTS/PURPOSE OF CONSULTATION:   F/u for follicular lymphoma/DLBCL  HISTORY OF PRESENTING ILLNESS:   Please see previous note for details of initial presentation.  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 She has completed 6 cycles of R CHOP and is currently in complete remission. She presents today for her fourteenth cycle of maintenance Rituxan. The patient's last visit with [place] was on 11/14/2018. The pt reports that she is doing well overall.  The pt reports that she feels good but sometimes struggles with the isolation due to the pandemic. She has been walking a lot lately, between about 5-7 miles a day, sometimes with friends. As well as practicing Tai Chi in the mornings. She has continued to practice virtually for the time being. Pt plans on getting her yearly flu vaccine today and has an upcoming appointment with her Dentist. She is also interested in getting the vaccine for [place]. She would like to plan for a vacation in September of next year. Pt would like to move her appointment on Friday November, 13 to a Monday.   She reports that she did experience an eyelid infection and got it looked at. It was revealed to be most likely conjunctivitis. Pt was using eyedrops to cure her infection until they ran out. She still experiences itching in her eyelid. She has also been suffering from cramping in her left big toe. It generally occurs at night and is improved with walking. Pt has been taking her Vitamin D erattically.   Lab results today (01/15/19) of CBC w/diff and CMP is as follows: all values are WNL except for Neutro Abs at 8.5K, Lymphs Abs at 0.3K, Glucose at 122. 01/15/2019 LDH is 188  On review of systems, pt reports  cramps in her big toe on her left foot, improved neuropathy, itching eyelid and denies abdominal pain, new skin rashes and any other symptoms.    MEDICAL HISTORY:  Past Medical History:  Diagnosis Date  . Anemia    h/o iron deficiency secondary to heavy menses  . Arthritis    left hand index finger  . 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 Grief reaction 01/27/2016  . H/O measles    as a child  . History of chicken pox    as a child  . History of PCOS   . Hyperlipidemia, mixed 01/27/2016  . Lymphadenopathy   . 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 Preventative health care 01/27/2016  . Vitamin D deficiency   . Wears glasses     SURGICAL HISTORY: Past Surgical History:  Procedure Laterality Date  . COLONOSCOPY    . IR GENERIC HISTORICAL  05/21/2016   IR FLUORO GUIDE PORT INSERTION RIGHT 05/21/2016 Lindsay Cleveland, MD [facility]INTER[facility]  . IR GENERIC HISTORICAL  05/21/2016   IR US GUIDE VASC ACCESS RIGHT 05/21/2016 Lindsay Cleveland, MD [facility]  . IR REMOVAL TUN ACCESS W/ PORT W/O FL MOD SED  10/27/2016  . SKIN BIOPSY Left    face  . THYROGLOSSAL DUCT CYST N/A 04/16/2016   Procedure: THYROGLOSSAL DUCT CYST excision;  Surgeon: Lindsay Belfast, MD;  Location: [facility];  Service: ENT;  Laterality: N/A;    SOCIAL HISTORY: Social History   Socioeconomic  History  . Marital status: Widowed    Spouse name: Not on file  . Number of children: Not on file  . Years of education: Not on file  . Highest education level: Not on file  Occupational History  . Occupation: Psychotherapist  Social Needs  . Financial resource strain: Not on file  . Food insecurity    Worry: Not on file    Inability: Not on file  . Transportation needs    Medical: Not on file    Non-medical: Not on file  Tobacco Use  . Smoking status: Never Smoker  . Smokeless tobacco: Never Used  Substance and Sexual Activity  . Alcohol use: Yes    Alcohol/week: 3.0 - 6.0 standard drinks    Types: 3 - 6 Standard  drinks or equivalent per week    Comment: 5 glasses of wine a week.  . Drug use: No  . Sexual activity: Never    Partners: Male    Birth control/protection: None  Lifestyle  . Physical activity    Days per week: Not on file    Minutes per session: Not on file  . Stress: Not on file  Relationships  . Social Herbalist on phone: Not on file    Gets together: Not on file    Attends religious service: Not on file    Active member of club or organization: Not on file    Attends meetings of clubs or organizations: Not on file    Relationship status: Not on file  . Intimate partner violence    Fear of current or ex partner: Not on file    Emotionally abused: Not on file    Physically abused: Not on file    Forced sexual activity: Not on file  Other Topics Concern  . Not on file  Social History Narrative   Married. Education: [employer]. Exercise: walking, yoga, and bicycling daily for 1-2 hours. Lives alone, works as Management consultant    FAMILY HISTORY: Family History  Problem Relation Age of Onset  . Arthritis Mother        rheumatoid  . Heart disease Father        CHF  . COPD Sister        Emphysema, h/o cigarettes  . Other Sister        pituatary tumor  . Arthritis Sister   . Obesity Sister   . Cancer Paternal Grandfather        cancer  . Stroke Maternal Aunt     ALLERGIES:  is allergic to erythromycin and other.  MEDICATIONS:  Current Outpatient Medications  Medication Sig Dispense Refill  . Cholecalciferol (VITAMIN D) 2000 units CAPS Take 2,000 Units by mouth daily.    [name] dexamethasone (DECADRON) 4 MG tablet TAKE 3 TABLETS BY MOUTH TWICE DAILY, TAKE 1 DOSE IN THE MORNING AND 1 DOSE IN THE EVENING THE DAY PRIOR TO Rituxan 12 tablet 6  . NEOMYCIN-POLYMYXIN-HC, OPHTH, SUSP 3 drops in both eyes 4 times daily 10 mL 1  . Omega-3 Fatty Acids (OMEGA-3 PO) Take 2 capsules by mouth daily. Omega 3 1280 mg     No current facility-administered medications for this  visit.     REVIEW OF SYSTEMS:    A 10+ POINT REVIEW OF SYSTEMS WAS OBTAINED including neurology, dermatology, psychiatry, cardiac, respiratory, lymph, extremities, GI, GU, Musculoskeletal, constitutional, breasts, reproductive, HEENT.  All pertinent positives are noted in the HPI.  All others are negative.   PHYSICAL  EXAMINATION:  ECOG PERFORMANCE STATUS: 0 - Asymptomatic  Vitals:   01/15/19 0827  BP: 127/68  Pulse: 69  Resp: 17  Temp: 99.6 F (37.6 C)  SpO2: 100%   Filed Weights   01/15/19 0827  Weight: 159 lb 6.4 oz (72.3 kg)   .Body mass index is 23.54 kg/m.   GENERAL:alert, in no acute distress and comfortable SKIN: no acute rashes, no significant lesions EYES: conjunctiva are pink and non-injected, sclera anicteric OROPHARYNX: MMM, no exudates, no oropharyngeal erythema or ulceration NECK: supple, no JVD LYMPH:  no palpable lymphadenopathy in the cervical, axillary or inguinal regions LUNGS: clear to auscultation b/l with normal respiratory effort HEART: regular rate & rhythm ABDOMEN:  normoactive bowel sounds , non tender, not distended. No palpable hepatosplenomegaly.  Extremity: no pedal edema PSYCH: alert & oriented x 3 with fluent speech NEURO: no focal motor/sensory deficits  LABORATORY DATA:  I have reviewed the data as listed  . CBC Latest Ref Rng & Units 01/15/2019 11/06/2018 09/18/2018  WBC 4.0 - 10.5 K/uL 9.5 5.0 10.9(H)  Hemoglobin 12.0 - 15.0 g/dL 13.0 14.3 12.9  Hematocrit 36.0 - 46.0 % 38.2 41.9 38.2  Platelets 150 - 400 K/uL 276 254 271   CBC    Component Value Date/Time   WBC 9.5 01/15/2019 0759   RBC 4.15 01/15/2019 0759   HGB 13.0 01/15/2019 0759   HGB 13.9 05/10/2018 0946   HGB 13.3 03/07/2017 0842   HCT 38.2 01/15/2019 0759   HCT 39.1 03/07/2017 0842   PLT 276 01/15/2019 0759   PLT 272 05/10/2018 0946   PLT 283 03/07/2017 0842   MCV 92.0 01/15/2019 0759   MCV 91.4 03/07/2017 0842   MCH 31.3 01/15/2019 0759   MCHC 34.0 01/15/2019  0759   RDW 12.6 01/15/2019 0759   RDW 13.3 03/07/2017 0842   LYMPHSABS 0.3 (L) 01/15/2019 0759   LYMPHSABS 0.4 (L) 03/07/2017 0842   MONOABS 0.6 01/15/2019 0759   MONOABS 0.7 03/07/2017 0842   EOSABS 0.0 01/15/2019 0759   EOSABS 0.0 03/07/2017 0842   BASOSABS 0.0 01/15/2019 0759   BASOSABS 0.0 03/07/2017 0842    . CMP Latest Ref Rng & Units 01/15/2019 11/06/2018 09/18/2018  Glucose 70 - 99 mg/dL 122(H) 93 126(H)  BUN 8 - 23 mg/dL _0 Creatinine 0.44 - 1.00 mg/dL 0.75 0.75 0.68  Sodium 135 - 145 mmol/L 138 139 139  Potassium 3.5 - 5.1 mmol/L 4.2 4.1 3.8  Chloride 98 - 111 mmol/L 105 106 107  CO2 22 - 32 mmol/L _1 Calcium 8.9 - 10.3 mg/dL 9.8 9.3 9.6  Total Protein 6.5 - 8.1 g/dL 6.8 6.7 6.5  Total Bilirubin 0.3 - 1.2 mg/dL 0.6 0.5 0.5  Alkaline Phos 38 - 126 U/L 110 127(H) 116  AST 15 - 41 U/L 15 18 13(L)  ALT 0 - 44 U/L _2 Component     Latest Ref Rng & Units 05/07/2016  Hepatitis B Surface Ag     Negative Negative  Hep B Core Ab, Tot     Negative Negative  Hep C Virus Ab     0.0 - 0.9 s/co ratio <0.1   . Lab Results  Component Value Date   LDH 188 01/15/2019         RADIOGRAPHIC STUDIES:   ASSESSMENT & PLAN:   73 y.o. 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 Stage  IVAE  Currently in complete remission.  Initial PET/CT showed Scattered small mildly hypermetabolic lymph nodes within the neck, left hilum, left axilla and left groin, potentially related the patient's lymphoma. Focal hypermetabolic activity within the left sternal manubrium and left iliac bone, also potentially related to the patient's lymphoma. No evidence of solid visceral organ involvement.  CT guided bone marrow biopsy done -- no evidence of lymphoma involving Bone marrow. Normal LDH. No constitutional symptoms. Patient has no significant medical comorbidities at baseline. ECHO with nl EF as expected.   Patient has completed 6 cycles of R CHOP PET/CT s/p 6 cycles of R-CHOP shows complete metabolic response CT chest/abd/pelvis 07/07/2017 - showed no radipgraphic evidence of lymphoma progression at this time.  Lab Results  Component Value Date   LDH 188 01/15/2019   11/06/2018 CT C/A/P with results revealing "1. No evidence of recurrent lymphoma. 2.  Aortic atherosclerosis (ICD10-170.0)."   2) Rituxan hypersensitivity (mild grade 1 hives on the back ) - resolved with solumedrol and antihistamines. No issues with dexamethasone pretreatment -Patient will continue dexamethasone premedication to reduce risk of Rituxan hypersensitivity.  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 -Discussed 01/15/2019 LDH is 188 -Advised pt to take Vitamin D three times a week, last count from 3/12/0 at 27.38, and eat more dried fruits. -Advised pt to continue to clean her eyelids with baby soap and water twice a day very gently.  -Discussed the upcoming COVID19 vaccine and that we will know more about pt's potential contraindications when it is released.  -The pt has no prohibitive toxicities from continuing her C14 of maintenance Rituxan, with dexamethasone prior to infusion, at this time. -No live attenuated virus vaccines -Advised crowd avoidance, and recommended infection prevention precautions. She is up to date on her vaccinations. -Continue age appropriate cancer screening and mammograms with PCP -Will see the pt back with next dose of maintainence Rituxan.  FOLLOW UP: F/u with next dose of maintenance Rituxan on 03/19/2019 as ordered.Patient prefers 11/16 instead of 11/13. -labs and MD visit with next dose of maintenance Rituxan   Orders Placed This Encounter  Procedures  . CBC with Differential/Platelet    Standing Status:   Future     Standing Expiration Date:   02/19/2020  . CMP ([facility] [place] only)    Standing Status:   Future    Standing Expiration Date:   01/15/2020  . Lactate dehydrogenase    Standing Status:   Future    Standing Expiration Date:   01/15/2020    The total time spent in the appt was 20 minutes and more than 50% was on counseling and direct patient cares.  All of the patient's questions were answered with apparent satisfaction. The patient knows to call the clinic with any problems, questions or concerns.   Lindsay Lone MD [place] AAHIVMS [facility] Hematology/Oncology Physician [facility]  (Office):       [phone] (Work cell):  [phone] (Fax):           [phone]  I, Lindsay Harrison, am acting as a scribe for Dr. Sullivan Harrison.   .I have reviewed the above documentation for accuracy and completeness, and I agree with the above. Lindsay Genera MD

## 2019-01-15 ENCOUNTER — Other Ambulatory Visit: Payer: Self-pay

## 2019-01-15 ENCOUNTER — Telehealth: Payer: Self-pay | Admitting: Hematology

## 2019-01-15 ENCOUNTER — Inpatient Hospital Stay: Payer: Medicare Other

## 2019-01-15 ENCOUNTER — Inpatient Hospital Stay: Payer: Medicare Other | Attending: Hematology

## 2019-01-15 ENCOUNTER — Inpatient Hospital Stay (HOSPITAL_BASED_OUTPATIENT_CLINIC_OR_DEPARTMENT_OTHER): Payer: Medicare Other | Admitting: Hematology

## 2019-01-15 VITALS — BP 105/64 | HR 53 | Temp 97.8°F | Resp 18

## 2019-01-15 VITALS — BP 127/68 | HR 69 | Temp 99.6°F | Resp 17 | Ht 69.0 in | Wt 159.4 lb

## 2019-01-15 DIAGNOSIS — C8330 Diffuse large B-cell lymphoma, unspecified site: Secondary | ICD-10-CM | POA: Diagnosis not present

## 2019-01-15 DIAGNOSIS — E559 Vitamin D deficiency, unspecified: Secondary | ICD-10-CM | POA: Diagnosis not present

## 2019-01-15 DIAGNOSIS — C8298 Follicular lymphoma, unspecified, lymph nodes of multiple sites: Secondary | ICD-10-CM

## 2019-01-15 DIAGNOSIS — D649 Anemia, unspecified: Secondary | ICD-10-CM | POA: Insufficient documentation

## 2019-01-15 DIAGNOSIS — Z23 Encounter for immunization: Secondary | ICD-10-CM | POA: Diagnosis not present

## 2019-01-15 DIAGNOSIS — Z7189 Other specified counseling: Secondary | ICD-10-CM

## 2019-01-15 DIAGNOSIS — Z7289 Other problems related to lifestyle: Secondary | ICD-10-CM | POA: Diagnosis not present

## 2019-01-15 DIAGNOSIS — Z0000 Encounter for general adult medical examination without abnormal findings: Secondary | ICD-10-CM

## 2019-01-15 DIAGNOSIS — E785 Hyperlipidemia, unspecified: Secondary | ICD-10-CM | POA: Insufficient documentation

## 2019-01-15 DIAGNOSIS — Z5112 Encounter for antineoplastic immunotherapy: Secondary | ICD-10-CM | POA: Insufficient documentation

## 2019-01-15 DIAGNOSIS — Z79899 Other long term (current) drug therapy: Secondary | ICD-10-CM | POA: Insufficient documentation

## 2019-01-15 LAB — CMP (CANCER CENTER ONLY)
ALT: 20 U/L (ref 0–44)
AST: 15 U/L (ref 15–41)
Albumin: 4.4 g/dL (ref 3.5–5.0)
Alkaline Phosphatase: 110 U/L (ref 38–126)
Anion gap: 11 (ref 5–15)
BUN: 20 mg/dL (ref 8–23)
CO2: 22 mmol/L (ref 22–32)
Calcium: 9.8 mg/dL (ref 8.9–10.3)
Chloride: 105 mmol/L (ref 98–111)
Creatinine: 0.75 mg/dL (ref 0.44–1.00)
GFR, Est AFR Am: >60 mL/min (ref >60)
GFR, Estimated: >60 mL/min (ref >60)
Glucose, Bld: 122 mg/dL — ABNORMAL HIGH (ref 70–99)
Potassium: 4.2 mmol/L (ref 3.5–5.1)
Sodium: 138 mmol/L (ref 135–145)
Total Bilirubin: 0.6 mg/dL (ref 0.3–1.2)
Total Protein: 6.8 g/dL (ref 6.5–8.1)

## 2019-01-15 LAB — CBC WITH DIFFERENTIAL/PLATELET
Abs Immature Granulocytes: 0.02 10*3/uL (ref 0.00–0.07)
Basophils Absolute: 0 10*3/uL (ref 0.0–0.1)
Basophils Relative: 0 %
Eosinophils Absolute: 0 10*3/uL (ref 0.0–0.5)
Eosinophils Relative: 0 %
HCT: 38.2 % (ref 36.0–46.0)
Hemoglobin: 13 g/dL (ref 12.0–15.0)
Immature Granulocytes: 0 %
Lymphocytes Relative: 3 %
Lymphs Abs: 0.3 10*3/uL — ABNORMAL LOW (ref 0.7–4.0)
MCH: 31.3 pg (ref 26.0–34.0)
MCHC: 34 g/dL (ref 30.0–36.0)
MCV: 92 fL (ref 80.0–100.0)
Monocytes Absolute: 0.6 10*3/uL (ref 0.1–1.0)
Monocytes Relative: 7 %
Neutro Abs: 8.5 10*3/uL — ABNORMAL HIGH (ref 1.7–7.7)
Neutrophils Relative %: 90 %
Platelets: 276 10*3/uL (ref 150–400)
RBC: 4.15 MIL/uL (ref 3.87–5.11)
RDW: 12.6 % (ref 11.5–15.5)
WBC: 9.5 10*3/uL (ref 4.0–10.5)
nRBC: 0 % (ref 0.0–0.2)

## 2019-01-15 LAB — LACTATE DEHYDROGENASE: LDH: 188 U/L (ref 98–192)

## 2019-01-15 MED ORDER — ACETAMINOPHEN 325 MG PO TABS
ORAL_TABLET | ORAL | Status: AC
  Filled 2019-01-15: qty 2

## 2019-01-15 MED ORDER — INFLUENZA VAC A&B SA ADJ QUAD 0.5 ML IM PRSY
PREFILLED_SYRINGE | INTRAMUSCULAR | Status: AC
  Filled 2019-01-15: qty 0.5

## 2019-01-15 MED ORDER — ACETAMINOPHEN 325 MG PO TABS
650.0000 mg | ORAL_TABLET | Freq: Once | ORAL | Status: AC
  Administered 2019-01-15: 650 mg via ORAL

## 2019-01-15 MED ORDER — DIPHENHYDRAMINE HCL 25 MG PO CAPS
ORAL_CAPSULE | ORAL | Status: AC
  Filled 2019-01-15: qty 2

## 2019-01-15 MED ORDER — SODIUM CHLORIDE 0.9 % IV SOLN
Freq: Once | INTRAVENOUS | Status: AC
  Administered 2019-01-15: 10:00:00 via INTRAVENOUS
  Filled 2019-01-15: qty 250

## 2019-01-15 MED ORDER — INFLUENZA VAC A&B SA ADJ QUAD 0.5 ML IM PRSY
0.5000 mL | PREFILLED_SYRINGE | INTRAMUSCULAR | Status: AC
  Administered 2019-01-15: 0.5 mL via INTRAMUSCULAR

## 2019-01-15 MED ORDER — DIPHENHYDRAMINE HCL 25 MG PO CAPS
50.0000 mg | ORAL_CAPSULE | Freq: Once | ORAL | Status: AC
  Administered 2019-01-15: 50 mg via ORAL

## 2019-01-15 MED ORDER — SODIUM CHLORIDE 0.9 % IV SOLN
375.0000 mg/m2 | Freq: Once | INTRAVENOUS | Status: AC
  Administered 2019-01-15: 700 mg via INTRAVENOUS
  Filled 2019-01-15: qty 20

## 2019-01-15 MED ORDER — SODIUM CHLORIDE 0.9 % IV SOLN
12.0000 mg | Freq: Once | INTRAVENOUS | Status: AC
  Administered 2019-01-15: 12 mg via INTRAVENOUS
  Filled 2019-01-15: qty 1.2

## 2019-01-15 NOTE — Patient Instructions (Signed)
[facility] Discharge Instructions for Patients Receiving Chemotherapy  Today you received the following immunotherapy agent: Rituxumamb     BELOW ARE SYMPTOMS THAT SHOULD BE REPORTED IMMEDIATELY:  *FEVER GREATER THAN 100.5 F  *CHILLS WITH OR WITHOUT FEVER  NAUSEA AND VOMITING THAT IS NOT CONTROLLED WITH YOUR NAUSEA MEDICATION  *UNUSUAL SHORTNESS OF BREATH  *UNUSUAL BRUISING OR BLEEDING  TENDERNESS IN MOUTH AND THROAT WITH OR WITHOUT PRESENCE OF ULCERS  *URINARY PROBLEMS  *BOWEL PROBLEMS  UNUSUAL RASH Items with * indicate a potential emergency and should be followed up as soon as possible.  Feel free to call the clinic should you have any questions or concerns. The clinic phone number is [phone].  Please show the [facility] at check-in to the Emergency Department and triage nurse.   Rituximab injection What is this medicine? RITUXIMAB (ri TUX i mab) is a monoclonal antibody. It is used to treat certain types of cancer like non-Hodgkin lymphoma and chronic lymphocytic leukemia. It is also used to treat rheumatoid arthritis, granulomatosis with polyangiitis (or Wegener's granulomatosis), microscopic polyangiitis, and pemphigus vulgaris. This medicine may be used for other purposes; ask your health care provider or pharmacist if you have questions. COMMON BRAND NAME(S): Rituxan What should I tell my health care provider before I take this medicine? They need to know if you have any of these conditions: -heart disease -infection (especially a virus infection such as hepatitis B, chickenpox, cold sores, or herpes) -immune system problems -irregular heartbeat -kidney disease -low blood counts, like low white cell, platelet, or red cell counts -lung or breathing disease, like asthma -recently received or scheduled to receive a vaccine -an unusual or allergic reaction to rituximab, other medicines, foods, dyes, or preservatives -pregnant or trying  to get pregnant -breast-feeding How should I use this medicine? This medicine is for infusion into a vein. It is administered in a hospital or clinic by a specially trained health care professional. A special MedGuide will be given to you by the pharmacist with each prescription and refill. Be sure to read this information carefully each time. Talk to your pediatrician regarding the use of this medicine in children. This medicine is not approved for use in children. Overdosage: If you think you have taken too much of this medicine contact a poison control center or emergency room at once. NOTE: This medicine is only for you. Do not share this medicine with others. What if I miss a dose? It is important not to miss a dose. Call your doctor or health care professional if you are unable to keep an appointment. What may interact with this medicine? -cisplatin -live virus vaccines This list may not describe all possible interactions. Give your health care provider a list of all the medicines, herbs, non-prescription drugs, or dietary supplements you use. Also tell them if you smoke, drink alcohol, or use illegal drugs. Some items may interact with your medicine. What should I watch for while using this medicine? Your condition will be monitored carefully while you are receiving this medicine. You may need blood work done while you are taking this medicine. This medicine can cause serious allergic reactions. To reduce your risk you may need to take medicine before treatment with this medicine. Take your medicine as directed. In some patients, this medicine may cause a serious brain infection that may cause death. If you have any problems seeing, thinking, speaking, walking, or standing, tell your healthcare professional right away. If you cannot reach  your healthcare professional, urgently seek other source of medical care. Call your doctor or health care professional for advice if you get a fever,  chills or sore throat, or other symptoms of a cold or flu. Do not treat yourself. This drug decreases your body's ability to fight infections. Try to avoid being around people who are sick. Do not become pregnant while taking this medicine or for 12 months after stopping it. Women should inform their doctor if they wish to become pregnant or think they might be pregnant. There is a potential for serious side effects to an unborn child. Talk to your health care professional or pharmacist for more information. Do not breast-feed an infant while taking this medicine or for 6 months after stopping it. What side effects may I notice from receiving this medicine? Side effects that you should report to your doctor or health care professional as soon as possible: -allergic reactions like skin rash, itching or hives; swelling of the face, lips, or tongue -breathing problems -chest pain -changes in vision -diarrhea -headache with fever, neck stiffness, sensitivity to light, nausea, or confusion -fast, irregular heartbeat -loss of memory -low blood counts - this medicine may decrease the number of white blood cells, red blood cells and platelets. You may be at increased risk for infections and bleeding. -mouth sores -problems with balance, talking, or walking -redness, blistering, peeling or loosening of the skin, including inside the mouth -signs of infection - fever or chills, cough, sore throat, pain or difficulty passing urine -signs and symptoms of kidney injury like trouble passing urine or change in the amount of urine -signs and symptoms of liver injury like dark yellow or brown urine; general ill feeling or flu-like symptoms; light-colored stools; loss of appetite; nausea; right upper belly pain; unusually weak or tired; yellowing of the eyes or skin -signs and symptoms of low blood pressure like dizziness; feeling faint or lightheaded, falls; unusually weak or tired -stomach pain -swelling of the  ankles, feet, hands -unusual bleeding or bruising -vomiting Side effects that usually do not require medical attention (report to your doctor or health care professional if they continue or are bothersome): -headache -joint pain -muscle cramps or muscle pain -nausea -tiredness This list may not describe all possible side effects. Call your doctor for medical advice about side effects. You may report side effects to FDA at 1-800-FDA-1088. Where should I keep my medicine? This drug is given in a hospital or clinic and will not be stored at home. NOTE: This sheet is a summary. It may not cover all possible information. If you have questions about this medicine, talk to your doctor, pharmacist, or health care provider.  2019 Elsevier/Gold Standard (2017-04-01 13:04:32)

## 2019-01-15 NOTE — Telephone Encounter (Signed)
Scheduled appt per 9/14 los. °

## 2019-02-11 ENCOUNTER — Encounter: Payer: Self-pay | Admitting: Family Medicine

## 2019-02-12 DIAGNOSIS — H01004 Unspecified blepharitis left upper eyelid: Secondary | ICD-10-CM | POA: Diagnosis not present

## 2019-02-12 DIAGNOSIS — H52203 Unspecified astigmatism, bilateral: Secondary | ICD-10-CM | POA: Diagnosis not present

## 2019-02-12 DIAGNOSIS — H2513 Age-related nuclear cataract, bilateral: Secondary | ICD-10-CM | POA: Diagnosis not present

## 2019-02-12 DIAGNOSIS — H01001 Unspecified blepharitis right upper eyelid: Secondary | ICD-10-CM | POA: Diagnosis not present

## 2019-02-14 NOTE — Progress Notes (Signed)
Virtual Visit via Video Note  I connected with patient on 02/15/19 at  1:00 PM EDT by audio enabled telemedicine application and verified that I am speaking with the correct person using two identifiers.   THIS ENCOUNTER IS A VIRTUAL VISIT DUE TO COVID-19 - PATIENT WAS NOT SEEN IN THE OFFICE. PATIENT HAS CONSENTED TO VIRTUAL VISIT / TELEMEDICINE VISIT   Location of patient: home  Location of provider: office  I discussed the limitations of evaluation and management by telemedicine and the availability of in person appointments. The patient expressed understanding and agreed to proceed.   Subjective:   Lindsay Harrison is a 73 y.o. female who presents for Medicare Annual (Subsequent) preventive examination.  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   Review of [place] Home Safety/Smoke Alarms: Feels safe in home. Smoke alarms in place.  Lives alone in 1 story home.    Female:        Mammo- 02/17/18. Pt states she will schedule w/ [facility].   Dexa scan- declines this year        CCS- next due 03/2021 per pt.     Objective:     Vitals: Unable to assess. This visit is enabled though telemedicine due to Covid 19.   Advanced Directives 02/15/2019 12/07/2018 03/23/2018 02/02/2018 06/10/2017 05/09/2017 01/31/2017  Does Patient Have a Medical Advance Directive? Yes Yes Yes Yes Yes Yes Yes  Type of Paramedic of [place];Living will [place];Living will [place];Living will [place];Living will [place];Living will Healthcare Power of [place];Living will  Does patient want to make changes to medical advance directive? No - Patient declined - No - Patient declined - - - -  Copy of [place] in Chart? Yes - validated most recent copy scanned in chart (See row information) Yes - validated most recent copy scanned in chart (See row  information) - Yes - Yes -    Tobacco Social History   Tobacco Use  Smoking Status Never Smoker  Smokeless Tobacco Never Used     Counseling given: Not Answered   Clinical Intake: Pain : No/denies pain   Past Medical History:  Diagnosis Date  . Anemia    h/o iron deficiency secondary to heavy menses  . Arthritis    left hand index finger  . 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 Grief reaction 01/27/2016  . H/O measles    as a child  . History of chicken pox    as a child  . History of PCOS   . Hyperlipidemia, mixed 01/27/2016  . Lymphadenopathy   . 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 Preventative health care 01/27/2016  . Vitamin D deficiency   . Wears glasses    Past Surgical History:  Procedure Laterality Date  . COLONOSCOPY    . IR GENERIC HISTORICAL  05/21/2016   IR FLUORO GUIDE PORT INSERTION RIGHT 05/21/2016 Arne Cleveland, MD [facility]  . IR GENERIC HISTORICAL  05/21/2016   IR US GUIDE VASC ACCESS RIGHT 05/21/2016 Arne Cleveland, MD [facility]  . IR REMOVAL TUN ACCESS W/ PORT W/O FL MOD SED  10/27/2016  . SKIN BIOPSY Left    face  . THYROGLOSSAL DUCT CYST N/A 04/16/2016   Procedure: THYROGLOSSAL DUCT CYST excision;  Surgeon: [name], MD;  Location[facility] OR;  Service: ENT;  Laterality: N/A;   Family History  Problem Relation  Age of Onset  . Arthritis Mother        rheumatoid  . Heart disease Father        CHF  . COPD Sister        Emphysema, h/o cigarettes  . Other Sister        pituatary tumor  . Arthritis Sister   . Obesity Sister   . Cancer Paternal Grandfather        cancer  . Stroke Maternal Aunt    Social History   Socioeconomic History  . Marital status: Widowed    Spouse name: Not on file  . Number of children: Not on file  . Years of education: Not on file  . Highest education level: Not on file  Occupational History  . Occupation: Psychotherapist  Social Needs  . Financial resource strain: Not on file  .  Food insecurity    Worry: Not on file    Inability: Not on file  . Transportation needs    Medical: Not on file    Non-medical: Not on file  Tobacco Use  . Smoking status: Never Smoker  . Smokeless tobacco: Never Used  Substance and Sexual Activity  . Alcohol use: Yes    Alcohol/week: 3.0 - 6.0 standard drinks    Types: 3 - 6 Standard drinks or equivalent per week    Comment: 5 glasses of wine a week.  . Drug use: No  . Sexual activity: Never    Partners: Male    Birth control/protection: None  Lifestyle  . Physical activity    Days per week: Not on file    Minutes per session: Not on file  . Stress: Not on file  Relationships  . Social Herbalist on phone: Not on file    Gets together: Not on file    Attends religious service: Not on file    Active member of club or organization: Not on file    Attends meetings of clubs or organizations: Not on file    Relationship status: Not on file  Other Topics Concern  . Not on file  Social History Narrative   Married. Education: [employer]. Exercise: walking, yoga, and bicycling daily for 1-2 hours. Lives alone, works as psychotherapist    Outpatient Encounter Medications as of 02/15/2019  Medication Sig  . Cholecalciferol (VITAMIN D) 2000 units CAPS Take 2,000 Units by mouth daily.  [name] dexamethasone (DECADRON) 4 MG tablet TAKE 3 TABLETS BY MOUTH TWICE DAILY, TAKE 1 DOSE IN THE MORNING AND 1 DOSE IN THE EVENING THE DAY PRIOR TO Rituxan  . Omega-3 Fatty Acids (OMEGA-3 PO) Take 2 capsules by mouth daily. Omega 3 1280 mg  . [DISCONTINUED] NEOMYCIN-POLYMYXIN-HC, OPHTH, SUSP 3 drops in both eyes 4 times daily   No facility-administered encounter medications on file as of 02/15/2019.     Activities of Daily Living In your present state of health, do you have any difficulty performing the following activities: 02/15/2019  Hearing? N  Vision? N  Difficulty concentrating or making decisions? N  Walking or climbing stairs? N   Dressing or bathing? N  Doing errands, shopping? N  Preparing Food and eating ? N  Using the Toilet? N  In the past six months, have you accidently leaked urine? N  Do you have problems with loss of bowel control? N  Managing your Medications? N  Managing your Finances? N  Housekeeping or managing your Housekeeping? N  Some recent data might be hidden  Patient Care Team: [name], MD as PCP - General (Family Medicine)    Assessment:   This is a routine wellness examination for [place]. Physical assessment deferred to PCP.  Exercise Activities and Dietary recommendations Current Exercise Habits: Home exercise routine;Structured exercise class, Type of exercise: yoga;walking, Time (Minutes): > 60, Frequency (Times/Week): 7, Weekly Exercise (Minutes/Week): 0, Intensity: Mild, Exercise limited by: None identified   Diet (meal preparation, eat out, water intake, caffeinated beverages, dairy products, fruits and vegetables): in general, a "healthy" diet  , well balanced    Goals    . continue being socially and physically active (pt-stated)       Fall Risk Fall Risk  02/15/2019 07/13/2018 03/22/2018 02/02/2018 01/31/2017  Falls in the past year? 0 0 0 No No  Comment - - Emmi Telephone Survey: data to providers prior to load - -  Number falls in past yr: 0 - - - -  Injury with Fall? 0 - - - -   Depression Screen PHQ 2/9 Scores 02/15/2019 07/13/2018 02/02/2018 01/31/2017  PHQ - 2 Score 0 0 0 0     Cognitive Function  Ad8 score reviewed for issues:  Issues making decisions:no  Less interest in hobbies / activities:no  Repeats questions, stories (family complaining):no  Trouble using ordinary gadgets (microwave, computer, phone):no  Forgets the month or year: no  Mismanaging finances: no  Remembering appts:no  Daily problems with thinking and/or memory:no Ad8 score is=0         Immunization History  Administered Date(s) Administered  . Fluad Quad(high  Dose 65+) 01/15/2019  . Hepatitis A 07/17/2007, 04/04/2008  . Hepatitis B 07/17/2007, 09/11/2007, 02/15/2008  . Influenza Split 02/24/2013  . Influenza, High Dose Seasonal PF 01/27/2016  . Influenza,inj,Quad PF,6+ Mos 01/16/2014, 02/05/2015, 01/10/2017, 01/23/2018  . Pneumococcal Conjugate-13 01/16/2014  . Pneumococcal Polysaccharide-23 10/18/2016  . Pneumococcal-Unspecified 02/12/2009  . Td 10/27/2005  . Tdap 02/05/2015  . Typhoid Inactivated 07/17/2007  . Zoster 06/26/2007   Screening Tests Health Maintenance  Topic Date Due  . MAMMOGRAM  02/18/2020  . COLONOSCOPY  03/14/2021  . TETANUS/TDAP  02/04/2025  . INFLUENZA VACCINE  Completed  . DEXA SCAN  Completed  . Hepatitis C Screening  Completed  . PNA vac Low Risk Adult  Completed      Plan:     Please schedule your next medicare wellness visit with me in 1 yr.  Continue to eat heart healthy diet (full of fruits, vegetables, whole grains, lean protein, water--limit salt, fat, and sugar intake) and increase physical activity as tolerated.  Continue doing brain stimulating activities (puzzles, reading, adult coloring books, staying active) to keep memory sharp.     I have personally reviewed and noted the following in the patient's chart:   . Medical and social history . Use of alcohol, tobacco or illicit drugs  . Current medications and supplements . Functional ability and status . Nutritional status . Physical activity . Advanced directives . List of other physicians . Hospitalizations, surgeries, and ER visits in previous 12 months . Vitals . Screenings to include cognitive, depression, and falls . Referrals and appointments  In addition, I have reviewed and discussed with patient certain preventive protocols, quality metrics, and best practice recommendations. A written personalized care plan for preventive services as well as general preventive health recommendations were provided to patient.     Shela Nevin, [place]  02/15/2019

## 2019-02-15 ENCOUNTER — Other Ambulatory Visit: Payer: Self-pay

## 2019-02-15 ENCOUNTER — Ambulatory Visit (INDEPENDENT_AMBULATORY_CARE_PROVIDER_SITE_OTHER): Payer: Medicare Other | Admitting: *Deleted

## 2019-02-15 DIAGNOSIS — Z0000 Encounter for general adult medical examination without abnormal findings: Secondary | ICD-10-CM

## 2019-02-15 NOTE — Patient Instructions (Signed)
Please schedule your next medicare wellness visit with me in 1 yr.  Continue to eat heart healthy diet (full of fruits, vegetables, whole grains, lean protein, water--limit salt, fat, and sugar intake) and increase physical activity as tolerated.  Continue doing brain stimulating activities (puzzles, reading, adult coloring books, staying active) to keep memory sharp.    Lindsay Harrison , Thank you for taking time to come for your Medicare Wellness Visit. I appreciate your ongoing commitment to your health goals. Please review the following plan we discussed and let me know if I can assist you in the future.   These are the goals we discussed: Goals    . continue being socially and physically active (pt-stated)       This is a list of the screening recommended for you and due dates:  Health Maintenance  Topic Date Due  . Mammogram  02/18/2020  . Colon Cancer Screening  03/14/2021  . Tetanus Vaccine  02/04/2025  . Flu Shot  Completed  . DEXA scan (bone density measurement)  Completed  .  Hepatitis C: One time screening is recommended by Center for Disease Control  (CDC) for  adults born from [birth year] through 1965.   Completed  . Pneumonia vaccines  Completed    Health Maintenance After Age 65 After age 3, you are at a higher risk for certain long-term diseases and infections as well as injuries from falls. Falls are a major cause of broken bones and head injuries in people who are older than age 21. Getting regular preventive care can help to keep you healthy and well. Preventive care includes getting regular testing and making lifestyle changes as recommended by your health care provider. Talk with your health care provider about:  Which screenings and tests you should have. A screening is a test that checks for a disease when you have no symptoms.  A diet and exercise plan that is right for you. What should I know about screenings and tests to prevent falls? Screening and testing are  the best ways to find a health problem early. Early diagnosis and treatment give you the best chance of managing medical conditions that are common after age 41. Certain conditions and lifestyle choices may make you more likely to have a fall. Your health care provider may recommend:  Regular vision checks. Poor vision and conditions such as cataracts can make you more likely to have a fall. If you wear glasses, make sure to get your prescription updated if your vision changes.  Medicine review. Work with your health care provider to regularly review all of the medicines you are taking, including over-the-counter medicines. Ask your health care provider about any side effects that may make you more likely to have a fall. Tell your health care provider if any medicines that you take make you feel dizzy or sleepy.  Osteoporosis screening. Osteoporosis is a condition that causes the bones to get weaker. This can make the bones weak and cause them to break more easily.  Blood pressure screening. Blood pressure changes and medicines to control blood pressure can make you feel dizzy.  Strength and balance checks. Your health care provider may recommend certain tests to check your strength and balance while standing, walking, or changing positions.  Foot health exam. Foot pain and numbness, as well as not wearing proper footwear, can make you more likely to have a fall.  Depression screening. You may be more likely to have a fall if you have a  fear of falling, feel emotionally low, or feel unable to do activities that you used to do.  Alcohol use screening. Using too much alcohol can affect your balance and may make you more likely to have a fall. What actions can I take to lower my risk of falls? General instructions  Talk with your health care provider about your risks for falling. Tell your health care provider if: ? You fall. Be sure to tell your health care provider about all falls, even ones that  seem minor. ? You feel dizzy, sleepy, or off-balance.  Take over-the-counter and prescription medicines only as told by your health care provider. These include any supplements.  Eat a healthy diet and maintain a healthy weight. A healthy diet includes low-fat dairy products, low-fat (lean) meats, and fiber from whole grains, beans, and lots of fruits and vegetables. Home safety  Remove any tripping hazards, such as rugs, cords, and clutter.  Install safety equipment such as grab bars in bathrooms and safety rails on stairs.  Keep rooms and walkways well-lit. Activity   Follow a regular exercise program to stay fit. This will help you maintain your balance. Ask your health care provider what types of exercise are appropriate for you.  If you need a cane or walker, use it as recommended by your health care provider.  Wear supportive shoes that have nonskid soles. Lifestyle  Do not drink alcohol if your health care provider tells you not to drink.  If you drink alcohol, limit how much you have: ? 0-1 drink a day for women. ? 0-2 drinks a day for men.  Be aware of how much alcohol is in your drink. In the [place] one drink equals one typical bottle of beer (12 oz), one-half glass of wine (5 oz), or one shot of hard liquor (1 oz).  Do not use any products that contain nicotine or tobacco, such as cigarettes and e-cigarettes. If you need help quitting, ask your health care provider. Summary  Having a healthy lifestyle and getting preventive care can help to protect your health and wellness after age 14.  Screening and testing are the best way to find a health problem early and help you avoid having a fall. Early diagnosis and treatment give you the best chance for managing medical conditions that are more common for people who are older than age 41.  Falls are a major cause of broken bones and head injuries in people who are older than age 39. Take precautions to prevent a fall at  home.  Work with your health care provider to learn what changes you can make to improve your health and wellness and to prevent falls. This information is not intended to replace advice given to you by your health care provider. Make sure you discuss any questions you have with your health care provider. Document Released: 03/02/2017 Document Revised: 08/10/2018 Document Reviewed: 03/02/2017 Elsevier Patient Education  2020 [employer].

## 2019-03-02 DIAGNOSIS — Z1231 Encounter for screening mammogram for malignant neoplasm of breast: Secondary | ICD-10-CM | POA: Diagnosis not present

## 2019-03-08 ENCOUNTER — Other Ambulatory Visit (HOSPITAL_COMMUNITY)
Admission: RE | Admit: 2019-03-08 | Discharge: 2019-03-08 | Disposition: A | Discharge status: Home / Self Care | Payer: Medicare Other | Source: Ambulatory Visit | Attending: Family Medicine | Admitting: Family Medicine

## 2019-03-08 ENCOUNTER — Ambulatory Visit (INDEPENDENT_AMBULATORY_CARE_PROVIDER_SITE_OTHER): Payer: Medicare Other | Admitting: Family Medicine

## 2019-03-08 ENCOUNTER — Other Ambulatory Visit: Payer: Self-pay

## 2019-03-08 VITALS — BP 122/78 | HR 74 | Temp 98.6°F | Resp 18 | Wt 157.6 lb

## 2019-03-08 DIAGNOSIS — D701 Agranulocytosis secondary to cancer chemotherapy: Secondary | ICD-10-CM | POA: Diagnosis not present

## 2019-03-08 DIAGNOSIS — R739 Hyperglycemia, unspecified: Secondary | ICD-10-CM | POA: Diagnosis not present

## 2019-03-08 DIAGNOSIS — Z124 Encounter for screening for malignant neoplasm of cervix: Secondary | ICD-10-CM | POA: Diagnosis not present

## 2019-03-08 DIAGNOSIS — D649 Anemia, unspecified: Secondary | ICD-10-CM | POA: Diagnosis not present

## 2019-03-08 DIAGNOSIS — R252 Cramp and spasm: Secondary | ICD-10-CM | POA: Diagnosis not present

## 2019-03-08 DIAGNOSIS — E559 Vitamin D deficiency, unspecified: Secondary | ICD-10-CM

## 2019-03-08 DIAGNOSIS — G629 Polyneuropathy, unspecified: Secondary | ICD-10-CM

## 2019-03-08 DIAGNOSIS — E782 Mixed hyperlipidemia: Secondary | ICD-10-CM

## 2019-03-08 LAB — VITAMIN B12: Vitamin B-12: 828 pg/mL (ref 211–911)

## 2019-03-08 LAB — HEMOGLOBIN A1C: Hgb A1c MFr Bld: 5.3 % (ref 4.6–6.5)

## 2019-03-08 LAB — VITAMIN D 25 HYDROXY (VIT D DEFICIENCY, FRACTURES): VITD: 31.94 ng/mL (ref 30.00–100.00)

## 2019-03-08 LAB — MAGNESIUM: Magnesium: 2.3 mg/dL (ref 1.5–2.5)

## 2019-03-08 NOTE — Patient Instructions (Addendum)
[facility]'s leg cramp medicine Neuropathy Neuropathic pain is pain caused by damage to the nerves that are responsible for certain sensations in your body (sensory nerves). The pain can be caused by:  Damage to the sensory nerves that send signals to your spinal cord and brain (peripheral nervous system).  Damage to the sensory nerves in your brain or spinal cord (central nervous system). Neuropathic pain can make you more sensitive to pain. Even a minor sensation can feel very painful. This is usually a long-term condition that can be difficult to treat. The type of pain differs from person to person. It may:  Start suddenly (acute), or it may develop slowly and last for a long time (chronic).  Come and go as damaged nerves heal, or it may stay at the same level for years.  Cause emotional distress, loss of sleep, and a lower quality of life. What are the causes? The most common cause of this condition is diabetes. Many other diseases and conditions can also cause neuropathic pain. Causes of neuropathic pain can be classified as:  Toxic. This is caused by medicines and chemicals. The most common cause of toxic neuropathic pain is damage from cancer treatments (chemotherapy).  Metabolic. This can be caused by: ? Diabetes. This is the most common disease that damages the nerves. ? Lack of vitamin B from long-term alcohol abuse.  Traumatic. Any injury that cuts, crushes, or stretches a nerve can cause damage and pain. A common example is feeling pain after losing an arm or leg (phantom limb pain).  Compression-related. If a sensory nerve gets trapped or compressed for a long period of time, the blood supply to the nerve can be cut off.  Vascular. Many blood vessel diseases can cause neuropathic pain by decreasing blood supply and oxygen to nerves.  Autoimmune. This type of pain results from diseases in which the body's defense system (immune system) mistakenly attacks sensory nerves. Examples  of autoimmune diseases that can cause neuropathic pain include lupus and multiple sclerosis.  Infectious. Many types of viral infections can damage sensory nerves and cause pain. Shingles infection is a common cause of this type of pain.  Inherited. Neuropathic pain can be a symptom of many diseases that are passed down through families (genetic). What increases the risk? You are more likely to develop this condition if:  You have diabetes.  You smoke.  You drink too much alcohol.  You are taking certain medicines, including medicines that kill cancer cells (chemotherapy) or that treat immune system disorders. What are the signs or symptoms? The main symptom is pain. Neuropathic pain is often described as:  Burning.  Shock-like.  Stinging.  Hot or cold.  Itching. How is this diagnosed? No single test can diagnose neuropathic pain. It is diagnosed based on:  Physical exam and your symptoms. Your health care provider will ask you about your pain. You may be asked to use a pain scale to describe how bad your pain is.  Tests. These may be done to see if you have a high sensitivity to pain and to help find the cause and location of any sensory nerve damage. They include: ? Nerve conduction studies to test how well nerve signals travel through your sensory nerves (electrodiagnostic testing). ? Stimulating your sensory nerves through electrodes on your skin and measuring the response in your spinal cord and brain (somatosensory evoked potential).  Imaging studies, such as: ? X-rays. ? CT scan. ? MRI. How is this treated? Treatment for neuropathic  pain may change over time. You may need to try different treatment options or a combination of treatments. Some options include:  Treating the underlying cause of the neuropathy, such as diabetes, kidney disease, or vitamin deficiencies.  Stopping medicines that can cause neuropathy, such as chemotherapy.  Medicine to relieve pain.  Medicines may include: ? Prescription or over-the-counter pain medicine. ? Anti-seizure medicine. ? Antidepressant medicines. ? Pain-relieving patches that are applied to painful areas of skin. ? A medicine to numb the area (local anesthetic), which can be injected as a nerve block.  Transcutaneous nerve stimulation. This uses electrical currents to block painful nerve signals. The treatment is painless.  Alternative treatments, such as: ? Acupuncture. ? Meditation. ? Massage. ? Physical therapy. ? Pain management programs. ? Counseling. Follow these instructions at home: Medicines   Take over-the-counter and prescription medicines only as told by your health care provider.  Do not drive or use heavy machinery while taking prescription pain medicine.  If you are taking prescription pain medicine, take actions to prevent or treat constipation. Your health care provider may recommend that you: ? Drink enough fluid to keep your urine pale yellow. ? Eat foods that are high in fiber, such as fresh fruits and vegetables, whole grains, and beans. ? Limit foods that are high in fat and processed sugars, such as fried or sweet foods. ? Take an over-the-counter or prescription medicine for constipation. Lifestyle   Have a good support system at home.  Consider joining a chronic pain support group.  Do not use any products that contain nicotine or tobacco, such as cigarettes and e-cigarettes. If you need help quitting, ask your health care provider.  Do not drink alcohol. General instructions  Learn as much as you can about your condition.  Work closely with all your health care providers to find the treatment plan that works best for you.  Ask your health care provider what activities are safe for you.  Keep all follow-up visits as told by your health care provider. This is important. Contact a health care provider if:  Your pain treatments are not working.  You are having  side effects from your medicines.  You are struggling with tiredness (fatigue), mood changes, depression, or anxiety. Summary  Neuropathic pain is pain caused by damage to the nerves that are responsible for certain sensations in your body (sensory nerves).  Neuropathic pain may come and go as damaged nerves heal, or it may stay at the same level for years.  Neuropathic pain is usually a long-term condition that can be difficult to treat. Consider joining a chronic pain support group. This information is not intended to replace advice given to you by your health care provider. Make sure you discuss any questions you have with your health care provider. Document Released: 01/15/2004 Document Revised: 08/10/2018 Document Reviewed: 05/06/2017 Elsevier Patient Education  2020 [employer].

## 2019-03-08 NOTE — Assessment & Plan Note (Signed)
Increase leafy greens, consider increased lean red meat and using cast iron cookware. Continue to monitor, report any concerns 

## 2019-03-08 NOTE — Assessment & Plan Note (Signed)
Fingers and toes started with cancer treatment with Vincristine. Travels up into leg some just above ankle. Gets some cramp in toes at night

## 2019-03-08 NOTE — Assessment & Plan Note (Addendum)
hgba1c acceptable, minimize simple carbs. Increase exercise as tolerated.  

## 2019-03-08 NOTE — Assessment & Plan Note (Signed)
Encouraged heart healthy diet, increase exercise, avoid trans fats, consider a krill oil cap daily 

## 2019-03-08 NOTE — Assessment & Plan Note (Signed)
Supplement and monitor 

## 2019-03-08 NOTE — Assessment & Plan Note (Signed)
Pap today, no concerns on exam.  

## 2019-03-09 LAB — CYTOLOGY - PAP: Diagnosis: NEGATIVE

## 2019-03-12 DIAGNOSIS — R252 Cramp and spasm: Secondary | ICD-10-CM | POA: Insufficient documentation

## 2019-03-12 NOTE — Progress Notes (Signed)
Subjective:    [name], female    DOB: 19-Nov-1945, 73 y.o.   MRN: [id number]  No chief complaint on file.   HPI Patient is in today for follow up on chronic medical concerns including hyperlipidemia, vitamin D deficiency and more. She is also due for a pap smear and agrees to proceed today. No GYN concerns or discharge. No recent febrile illness or hospitalizations. Denies CP/palp/SOB/HA/congestion/fevers/GI or GU c/o. Taking meds as prescribed  Past Medical History:  Diagnosis Date  . Anemia    h/o iron deficiency secondary to heavy menses  . Arthritis    left hand index finger  . 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 Grief reaction 01/27/2016  . H/O measles    as a child  . History of chicken pox    as a child  . History of PCOS   . Hyperlipidemia, mixed 01/27/2016  . Lymphadenopathy   . 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 Preventative health care 01/27/2016  . Vitamin D deficiency   . Wears glasses     Past Surgical History:  Procedure Laterality Date  . COLONOSCOPY    . IR GENERIC HISTORICAL  05/21/2016   IR FLUORO GUIDE PORT INSERTION RIGHT 05/21/2016 Arne Cleveland, MD [facility]  . IR GENERIC HISTORICAL  05/21/2016   IR US GUIDE VASC ACCESS RIGHT 05/21/2016 Arne Cleveland, MD [facility]  . IR REMOVAL TUN ACCESS W/ PORT W/O FL MOD SED  10/27/2016  . SKIN BIOPSY Left    face  . THYROGLOSSAL DUCT CYST N/A 04/16/2016   Procedure: THYROGLOSSAL DUCT CYST excision;  Surgeon: [name], MD;  Location: [facility] OR;  Service: ENT;  Laterality: N/A;    Family History  Problem Relation Age of Onset  . Arthritis Mother        rheumatoid  . Heart disease Father        CHF  . COPD Sister        Emphysema, h/o cigarettes  . Other Sister        pituatary tumor  . Arthritis Sister   . Obesity Sister   . Cancer Paternal Grandfather        cancer  . Stroke Maternal Aunt     Social History   Socioeconomic History  . Marital status:  Widowed    Spouse name: Not on file  . Number of children: Not on file  . Years of education: Not on file  . Highest education level: Not on file  Occupational History  . Occupation: Psychotherapist  Social Needs  . Financial resource strain: Not on file  . Food insecurity    Worry: Not on file    Inability: Not on file  . Transportation needs    Medical: Not on file    Non-medical: Not on file  Tobacco Use  . Smoking status: Never Smoker  . Smokeless tobacco: Never Used  Substance and Sexual Activity  . Alcohol use: Yes    Alcohol/week: 3.0 - 6.0 standard drinks    Types: 3 - 6 Standard drinks or equivalent per week    Comment: 5 glasses of wine a week.  . Drug use: No  . Sexual activity: Never    Partners: Male    Birth control/protection: None  Lifestyle  . Physical activity    Days per week: Not on file    Minutes per session: Not on file  . Stress: Not on file  Relationships  . Social  connections    Talks on phone: Not on file    Gets together: Not on file    Attends religious service: Not on file    Active member of club or organization: Not on file    Attends meetings of clubs or organizations: Not on file    Relationship status: Not on file  . Intimate partner violence    Fear of current or ex partner: Not on file    Emotionally abused: Not on file    Physically abused: Not on file    Forced sexual activity: Not on file  Other Topics Concern  . Not on file  Social History Narrative   Married. Education: [employer]. Exercise: walking, yoga, and bicycling daily for 1-2 hours. Lives alone, works as psychotherapist    Outpatient Medications Prior to Visit  Medication Sig Dispense Refill  . Cholecalciferol (VITAMIN D) 2000 units CAPS Take 2,000 Units by mouth daily.    [name] dexamethasone (DECADRON) 4 MG tablet TAKE 3 TABLETS BY MOUTH TWICE DAILY, TAKE 1 DOSE IN THE MORNING AND 1 DOSE IN THE EVENING THE DAY PRIOR TO Rituxan 12 tablet 6  . Omega-3 Fatty Acids  (OMEGA-3 PO) Take 2 capsules by mouth daily. Omega 3 1280 mg     No facility-administered medications prior to visit.     Allergies  Allergen Reactions  . Erythromycin Other (See Comments)    Patient states that she passed out while using this medication ? SYNCOPE ?  [name] Other Anaphylaxis    # # # CATS # # #    Review of Systems  Constitutional: Negative for fever and malaise/fatigue.  HENT: Negative for congestion.   Eyes: Negative for blurred vision.  Respiratory: Negative for shortness of breath.   Cardiovascular: Negative for chest pain, palpitations and leg swelling.  Gastrointestinal: Negative for abdominal pain, blood in stool and nausea.  Genitourinary: Negative for dysuria and frequency.  Musculoskeletal: Positive for myalgias. Negative for falls.  Skin: Negative for rash.  Neurological: Negative for dizziness, loss of consciousness and headaches.  Endo/Heme/Allergies: Negative for environmental allergies.  Psychiatric/Behavioral: Negative for depression. The patient is not nervous/anxious.        Objective:    Physical Exam Vitals signs and nursing note reviewed.  Constitutional:      General: She is not in acute distress.    Appearance: She is well-developed.  HENT:     Head: Normocephalic and atraumatic.     Nose: Nose normal.  Eyes:     General:        Right eye: No discharge.        Left eye: No discharge.  Neck:     Musculoskeletal: Normal range of motion and neck supple.  Cardiovascular:     Rate and Rhythm: Normal rate and regular rhythm.     Heart sounds: No murmur.  Pulmonary:     Effort: Pulmonary effort is normal.     Breath sounds: Normal breath sounds.  Abdominal:     General: Bowel sounds are normal.     Palpations: Abdomen is soft.     Tenderness: There is no abdominal tenderness.  Genitourinary:    General: Normal vulva.     Vagina: No vaginal discharge.     Comments: No vaginal or cervical lesions, no breast concerns, masses or skin  changes, no nipple discharge Skin:    General: Skin is warm and dry.  Neurological:     Mental Status: She is alert and oriented to  person, place, and time.     BP 122/78 (BP Location: Left Arm, Patient Position: Sitting, Cuff Size: Normal)   Pulse 74   Temp 98.6 F (37 C) (Temporal)   Resp 18   Wt 157 lb 9.6 oz (71.5 kg)   SpO2 99%   BMI 23.27 kg/m  Wt Readings from Last 3 Encounters:  03/08/19 157 lb 9.6 oz (71.5 kg)  01/15/19 159 lb 6.4 oz (72.3 kg)  12/07/18 157 lb 12.8 oz (71.6 kg)    Diabetic Foot Exam - Simple   No data filed     Lab Results  Component Value Date   WBC 9.5 01/15/2019   HGB 13.0 01/15/2019   HCT 38.2 01/15/2019   PLT 276 01/15/2019   GLUCOSE 122 (H) 01/15/2019   CHOL 222 (H) 07/13/2018   TRIG 69.0 07/13/2018   HDL 85.20 07/13/2018   LDLCALC 123 (H) 07/13/2018   ALT 20 01/15/2019   AST 15 01/15/2019   NA 138 01/15/2019   K 4.2 01/15/2019   CL 105 01/15/2019   CREATININE 0.75 01/15/2019   BUN 20 01/15/2019   CO2 22 01/15/2019   TSH 1.23 07/13/2018   INR 1.00 10/27/2016   HGBA1C 5.3 03/08/2019    Lab Results  Component Value Date   TSH 1.23 07/13/2018   Lab Results  Component Value Date   WBC 9.5 01/15/2019   HGB 13.0 01/15/2019   HCT 38.2 01/15/2019   MCV 92.0 01/15/2019   PLT 276 01/15/2019   Lab Results  Component Value Date   NA 138 01/15/2019   K 4.2 01/15/2019   CHLORIDE 106 03/07/2017   CO2 22 01/15/2019   GLUCOSE 122 (H) 01/15/2019   BUN 20 01/15/2019   CREATININE 0.75 01/15/2019   BILITOT 0.6 01/15/2019   ALKPHOS 110 01/15/2019   AST 15 01/15/2019   ALT 20 01/15/2019   PROT 6.8 01/15/2019   ALBUMIN 4.4 01/15/2019   CALCIUM 9.8 01/15/2019   ANIONGAP 11 01/15/2019   EGFR >60 03/07/2017   GFR 89.52 07/13/2018   Lab Results  Component Value Date   CHOL 222 (H) 07/13/2018   Lab Results  Component Value Date   HDL 85.20 07/13/2018   Lab Results  Component Value Date   LDLCALC 123 (H) 07/13/2018   Lab  Results  Component Value Date   TRIG 69.0 07/13/2018   Lab Results  Component Value Date   CHOLHDL 3 07/13/2018   Lab Results  Component Value Date   HGBA1C 5.3 03/08/2019       Assessment & Plan:   Problem List Items Addressed This Visit    Hyperlipidemia, mixed    Encouraged heart healthy diet, increase exercise, avoid trans fats, consider a krill oil cap daily      Vitamin D deficiency    Supplement and monitor      Relevant Orders   VITAMIN D (Completed)   Anemia    Increase leafy greens, consider increased lean red meat and using cast iron cookware. Continue to monitor, report any concerns.      Neuropathy    Fingers and toes started with cancer treatment with Vincristine. Travels up into leg some just above ankle. Gets some cramp in toes at night      Hyperglycemia    hgba1c acceptable, minimize simple carbs. Increase exercise as tolerated.      Relevant Orders   A1C (Completed)   Cervical cancer screening    Pap today, no concerns on exam.  Relevant Orders   Cytology - PAP( [place]) (Completed)   Agranulocytosis secondary to cancer chemotherapy (CODE) (HCC)   Relevant Orders   Vitamin B12 (Completed)   Muscle cramp - Primary    Hydrate well, check labs and try Hyland's leg cramp med      Relevant Orders   Magnesium (Completed)      I am having [name] "Fraser Din" maintain her Omega-3 Fatty Acids (OMEGA-3 PO), Vitamin D, and dexamethasone.  No orders of the defined types were placed in this encounter.    Penni Homans, MD

## 2019-03-12 NOTE — Assessment & Plan Note (Signed)
Hydrate well, check labs and try Hyland's leg cramp med

## 2019-03-16 ENCOUNTER — Ambulatory Visit: Payer: Medicare Other

## 2019-03-16 ENCOUNTER — Other Ambulatory Visit: Payer: Medicare Other

## 2019-03-16 ENCOUNTER — Ambulatory Visit: Payer: Medicare Other | Admitting: Hematology

## 2019-03-19 ENCOUNTER — Telehealth: Payer: Self-pay | Admitting: Hematology

## 2019-03-19 ENCOUNTER — Inpatient Hospital Stay: Payer: Medicare Other

## 2019-03-19 ENCOUNTER — Inpatient Hospital Stay: Payer: Medicare Other | Attending: Hematology

## 2019-03-19 ENCOUNTER — Inpatient Hospital Stay (HOSPITAL_BASED_OUTPATIENT_CLINIC_OR_DEPARTMENT_OTHER): Payer: Medicare Other | Admitting: Hematology

## 2019-03-19 ENCOUNTER — Other Ambulatory Visit: Payer: Self-pay

## 2019-03-19 VITALS — BP 110/58 | HR 54 | Temp 97.9°F | Resp 16

## 2019-03-19 VITALS — BP 132/70 | HR 65 | Temp 98.0°F | Resp 18 | Ht 69.0 in | Wt 162.5 lb

## 2019-03-19 DIAGNOSIS — Z7189 Other specified counseling: Secondary | ICD-10-CM

## 2019-03-19 DIAGNOSIS — Z836 Family history of other diseases of the respiratory system: Secondary | ICD-10-CM | POA: Insufficient documentation

## 2019-03-19 DIAGNOSIS — C8330 Diffuse large B-cell lymphoma, unspecified site: Secondary | ICD-10-CM | POA: Diagnosis not present

## 2019-03-19 DIAGNOSIS — Z8261 Family history of arthritis: Secondary | ICD-10-CM | POA: Insufficient documentation

## 2019-03-19 DIAGNOSIS — Z823 Family history of stroke: Secondary | ICD-10-CM | POA: Insufficient documentation

## 2019-03-19 DIAGNOSIS — C8298 Follicular lymphoma, unspecified, lymph nodes of multiple sites: Secondary | ICD-10-CM

## 2019-03-19 DIAGNOSIS — Z809 Family history of malignant neoplasm, unspecified: Secondary | ICD-10-CM | POA: Insufficient documentation

## 2019-03-19 DIAGNOSIS — Z5112 Encounter for antineoplastic immunotherapy: Secondary | ICD-10-CM | POA: Diagnosis not present

## 2019-03-19 DIAGNOSIS — G620 Drug-induced polyneuropathy: Secondary | ICD-10-CM | POA: Diagnosis not present

## 2019-03-19 DIAGNOSIS — C8338 Diffuse large B-cell lymphoma, lymph nodes of multiple sites: Secondary | ICD-10-CM | POA: Insufficient documentation

## 2019-03-19 DIAGNOSIS — Z7289 Other problems related to lifestyle: Secondary | ICD-10-CM | POA: Diagnosis not present

## 2019-03-19 DIAGNOSIS — Z79899 Other long term (current) drug therapy: Secondary | ICD-10-CM | POA: Diagnosis not present

## 2019-03-19 DIAGNOSIS — T451X5A Adverse effect of antineoplastic and immunosuppressive drugs, initial encounter: Secondary | ICD-10-CM | POA: Insufficient documentation

## 2019-03-19 DIAGNOSIS — Z8249 Family history of ischemic heart disease and other diseases of the circulatory system: Secondary | ICD-10-CM | POA: Insufficient documentation

## 2019-03-19 LAB — CMP (CANCER CENTER ONLY)
ALT: 47 U/L — ABNORMAL HIGH (ref 0–44)
AST: 32 U/L (ref 15–41)
Albumin: 4.3 g/dL (ref 3.5–5.0)
Alkaline Phosphatase: 182 U/L — ABNORMAL HIGH (ref 38–126)
Anion gap: 12 (ref 5–15)
BUN: 14 mg/dL (ref 8–23)
CO2: 21 mmol/L — ABNORMAL LOW (ref 22–32)
Calcium: 9.7 mg/dL (ref 8.9–10.3)
Chloride: 107 mmol/L (ref 98–111)
Creatinine: 0.72 mg/dL (ref 0.44–1.00)
GFR, Est AFR Am: >60 mL/min (ref >60)
GFR, Estimated: >60 mL/min (ref >60)
Glucose, Bld: 146 mg/dL — ABNORMAL HIGH (ref 70–99)
Potassium: 4 mmol/L (ref 3.5–5.1)
Sodium: 140 mmol/L (ref 135–145)
Total Bilirubin: 0.4 mg/dL (ref 0.3–1.2)
Total Protein: 6.7 g/dL (ref 6.5–8.1)

## 2019-03-19 LAB — CBC WITH DIFFERENTIAL/PLATELET
Abs Immature Granulocytes: 0.06 10*3/uL (ref 0.00–0.07)
Basophils Absolute: 0 10*3/uL (ref 0.0–0.1)
Basophils Relative: 0 %
Eosinophils Absolute: 0 10*3/uL (ref 0.0–0.5)
Eosinophils Relative: 0 %
HCT: 38.8 % (ref 36.0–46.0)
Hemoglobin: 13.1 g/dL (ref 12.0–15.0)
Immature Granulocytes: 0 %
Lymphocytes Relative: 2 %
Lymphs Abs: 0.3 10*3/uL — ABNORMAL LOW (ref 0.7–4.0)
MCH: 31 pg (ref 26.0–34.0)
MCHC: 33.8 g/dL (ref 30.0–36.0)
MCV: 91.7 fL (ref 80.0–100.0)
Monocytes Absolute: 0.4 10*3/uL (ref 0.1–1.0)
Monocytes Relative: 3 %
Neutro Abs: 13.1 10*3/uL — ABNORMAL HIGH (ref 1.7–7.7)
Neutrophils Relative %: 95 %
Platelets: 267 10*3/uL (ref 150–400)
RBC: 4.23 MIL/uL (ref 3.87–5.11)
RDW: 12.4 % (ref 11.5–15.5)
WBC: 13.9 10*3/uL — ABNORMAL HIGH (ref 4.0–10.5)
nRBC: 0 % (ref 0.0–0.2)

## 2019-03-19 LAB — LACTATE DEHYDROGENASE: LDH: 213 U/L — ABNORMAL HIGH (ref 98–192)

## 2019-03-19 MED ORDER — DIPHENHYDRAMINE HCL 25 MG PO CAPS
ORAL_CAPSULE | ORAL | Status: AC
  Filled 2019-03-19: qty 2

## 2019-03-19 MED ORDER — DIPHENHYDRAMINE HCL 25 MG PO CAPS
50.0000 mg | ORAL_CAPSULE | Freq: Once | ORAL | Status: AC
  Administered 2019-03-19: 50 mg via ORAL

## 2019-03-19 MED ORDER — SODIUM CHLORIDE 0.9 % IV SOLN
Freq: Once | INTRAVENOUS | Status: AC
  Administered 2019-03-19: 12:00:00 via INTRAVENOUS
  Filled 2019-03-19: qty 250

## 2019-03-19 MED ORDER — ACETAMINOPHEN 325 MG PO TABS
ORAL_TABLET | ORAL | Status: AC
  Filled 2019-03-19: qty 2

## 2019-03-19 MED ORDER — SODIUM CHLORIDE 0.9 % IV SOLN
375.0000 mg/m2 | Freq: Once | INTRAVENOUS | Status: AC
  Administered 2019-03-19: 700 mg via INTRAVENOUS
  Filled 2019-03-19: qty 50

## 2019-03-19 MED ORDER — ACETAMINOPHEN 325 MG PO TABS
650.0000 mg | ORAL_TABLET | Freq: Once | ORAL | Status: AC
  Administered 2019-03-19: 650 mg via ORAL

## 2019-03-19 MED ORDER — SODIUM CHLORIDE 0.9 % IV SOLN
12.0000 mg | Freq: Once | INTRAVENOUS | Status: AC
  Administered 2019-03-19: 12 mg via INTRAVENOUS
  Filled 2019-03-19: qty 1.2

## 2019-03-19 NOTE — Progress Notes (Signed)
[name]     HEMATOLOGY/ONCOLOGY CLINIC NOTE  Date of Service:  03/19/19     Patient Care Team: [name], MD as PCP - General (Family Medicine) [name] M.D. (ENT)  CHIEF COMPLAINTS/PURPOSE OF CONSULTATION:   F/u for follicular lymphoma/DLBCL  HISTORY OF PRESENTING ILLNESS:   Please see previous note for details of initial presentation.  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 She has completed 6 cycles of R CHOP and is currently in complete remission. She presents today for her C15 of maintenance Rituxan. The patient's last visit with [place] was on 01/15/2019. The pt reports that she is doing well overall.  The pt reports that she has been doing well in the interim. She has continued her [place] practice as well as walking. Pt is up to 7 miles per day. They are planning on doing virtual family gatherings for the upcoming holidays to maintain social distancing. She is still interested in the COVID-19 vaccines that will soon be available. Her anxiety has decreased due to the results of the latest presidential election. Pt has seen her Opthamologist who informed her that she had blepharitis. She has continued to wash her eyes as instructed. Her dog has passed on and she is considering getting a new dog. She is also thinking about taking a trip to [place] mid-late fall of 2021. Pt is drinking wine in the evenings on occasion but she has not been taking a significant amount of OTC pain medication.  Lab results today (03/19/19) of CBC w/diff and CMP is as follows: all values are WNL except for WBC at 13.9K, Neutro Abs at 13.1K, Lymphs Abs at 0.3K, CO2 at 21, Glucose at 146, ALT at 47, Alkaline Phosphatase at 182. 03/19/2019 LDH at 213  On review of systems, pt reports continued finger numbness and denies abdominal pain and any other symptoms.   MEDICAL HISTORY:  Past Medical History:  Diagnosis Date  . Anemia    h/o iron deficiency secondary to heavy menses  . Arthritis    left hand index finger  . 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 Grief reaction 01/27/2016  . H/O measles    as a child  . History of chicken pox    as a child  . History of PCOS   . Hyperlipidemia, mixed 01/27/2016  . Lymphadenopathy   . 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 Preventative health care 01/27/2016  . Vitamin D deficiency   . Wears glasses     SURGICAL HISTORY: Past Surgical History:  Procedure Laterality Date  . COLONOSCOPY    . IR GENERIC HISTORICAL  05/21/2016   IR FLUORO GUIDE PORT INSERTION RIGHT 05/21/2016 Arne Cleveland, MD [facility]  . IR GENERIC HISTORICAL  05/21/2016   IR US GUIDE VASC ACCESS RIGHT 05/21/2016 Arne Cleveland, MD [facility]  . IR REMOVAL TUN ACCESS W/ PORT W/O FL MOD SED  10/27/2016  . SKIN BIOPSY Left    face  . THYROGLOSSAL DUCT CYST N/A 04/16/2016   Procedure: THYROGLOSSAL DUCT CYST excision;  Surgeon: [name], MD;  Location: [facility];  Service: ENT;  Laterality: N/A;    SOCIAL HISTORY: Social History   Socioeconomic History  . Marital status: Widowed    Spouse name: Not on file  . Number of children: Not on file  . Years of education: Not on file  . Highest education level: Not on file  Occupational  History  . Occupation: Psychotherapist  Social Needs  . Financial resource strain: Not on file  . Food insecurity    Worry: Not on file    Inability: Not on file  . Transportation needs    Medical: Not on file    Non-medical: Not on file  Tobacco Use  . Smoking status: Never Smoker  . Smokeless tobacco: Never Used  Substance and Sexual Activity  . Alcohol use: Yes    Alcohol/week: 3.0 - 6.0 standard drinks    Types: 3 - 6 Standard drinks or equivalent per week    Comment: 5 glasses of wine a week.  . Drug use: No  . Sexual activity: Never    Partners: Male    Birth control/protection: None  Lifestyle  . Physical activity    Days per week:  Not on file    Minutes per session: Not on file  . Stress: Not on file  Relationships  . Social Herbalist on phone: Not on file    Gets together: Not on file    Attends religious service: Not on file    Active member of club or organization: Not on file    Attends meetings of clubs or organizations: Not on file    Relationship status: Not on file  . Intimate partner violence    Fear of current or ex partner: Not on file    Emotionally abused: Not on file    Physically abused: Not on file    Forced sexual activity: Not on file  Other Topics Concern  . Not on file  Social History Narrative   Married. Education: [employer]. Exercise: walking, yoga, and bicycling daily for 1-2 hours. Lives alone, works as Management consultant    FAMILY HISTORY: Family History  Problem Relation Age of Onset  . Arthritis Mother        rheumatoid  . Heart disease Father        CHF  . COPD Sister        Emphysema, h/o cigarettes  . Other Sister        pituatary tumor  . Arthritis Sister   . Obesity Sister   . Cancer Paternal Grandfather        cancer  . Stroke Maternal Aunt     ALLERGIES:  is allergic to erythromycin and other.  MEDICATIONS:  Current Outpatient Medications  Medication Sig Dispense Refill  . Cholecalciferol (VITAMIN D) 2000 units CAPS Take 2,000 Units by mouth daily.    [name] dexamethasone (DECADRON) 4 MG tablet TAKE 3 TABLETS BY MOUTH TWICE DAILY, TAKE 1 DOSE IN THE MORNING AND 1 DOSE IN THE EVENING THE DAY PRIOR TO Rituxan 12 tablet 6  . Omega-3 Fatty Acids (OMEGA-3 PO) Take 2 capsules by mouth daily. Omega 3 1280 mg     No current facility-administered medications for this visit.     REVIEW OF SYSTEMS:   A 10+ POINT REVIEW OF SYSTEMS WAS OBTAINED including neurology, dermatology, psychiatry, cardiac, respiratory, lymph, extremities, GI, GU, Musculoskeletal, constitutional, breasts, reproductive, HEENT.  All pertinent positives are noted in the HPI.  All others are  negative.   PHYSICAL EXAMINATION:  ECOG PERFORMANCE STATUS: 0 - Asymptomatic  Vitals:   03/19/19 1016  BP: 132/70  Pulse: 65  Resp: 18  Temp: 98 F (36.7 C)  SpO2: 99%   Filed Weights   03/19/19 1016  Weight: 162 lb 8 oz (73.7 kg)   .Body mass index is 24 kg/m.  GENERAL:alert, in no acute distress and comfortable SKIN: no acute rashes, no significant lesions EYES: conjunctiva are pink and non-injected, sclera anicteric OROPHARYNX: MMM, no exudates, no oropharyngeal erythema or ulceration NECK: supple, no JVD LYMPH:  no palpable lymphadenopathy in the cervical, axillary or inguinal regions LUNGS: clear to auscultation b/l with normal respiratory effort HEART: regular rate & rhythm ABDOMEN:  normoactive bowel sounds , non tender, not distended. No palpable hepatosplenomegaly.  Extremity: no pedal edema PSYCH: alert & oriented x 3 with fluent speech NEURO: no focal motor/sensory deficits  LABORATORY DATA:  I have reviewed the data as listed  . CBC Latest Ref Rng & Units 03/19/2019 01/15/2019 11/06/2018  WBC 4.0 - 10.5 K/uL 13.9(H) 9.5 5.0  Hemoglobin 12.0 - 15.0 g/dL 13.1 13.0 14.3  Hematocrit 36.0 - 46.0 % 38.8 38.2 41.9  Platelets 150 - 400 K/uL 267 276 254   CBC    Component Value Date/Time   WBC 13.9 (H) 03/19/2019 0911   RBC 4.23 03/19/2019 0911   HGB 13.1 03/19/2019 0911   HGB 13.9 05/10/2018 0946   HGB 13.3 03/07/2017 0842   HCT 38.8 03/19/2019 0911   HCT 39.1 03/07/2017 0842   PLT 267 03/19/2019 0911   PLT 272 05/10/2018 0946   PLT 283 03/07/2017 0842   MCV 91.7 03/19/2019 0911   MCV 91.4 03/07/2017 0842   MCH 31.0 03/19/2019 0911   MCHC 33.8 03/19/2019 0911   RDW 12.4 03/19/2019 0911   RDW 13.3 03/07/2017 0842   LYMPHSABS 0.3 (L) 03/19/2019 0911   LYMPHSABS 0.4 (L) 03/07/2017 0842   MONOABS 0.4 03/19/2019 0911   MONOABS 0.7 03/07/2017 0842   EOSABS 0.0 03/19/2019 0911   EOSABS 0.0 03/07/2017 0842   BASOSABS 0.0 03/19/2019 0911   BASOSABS 0.0  03/07/2017 0842    . CMP Latest Ref Rng & Units 03/19/2019 01/15/2019 11/06/2018  Glucose 70 - 99 mg/dL 146(H) 122(H) 93  BUN 8 - 23 mg/dL _0 Creatinine 0.44 - 1.00 mg/dL 0.72 0.75 0.75  Sodium 135 - 145 mmol/L 140 138 139  Potassium 3.5 - 5.1 mmol/L 4.0 4.2 4.1  Chloride 98 - 111 mmol/L 107 105 106  CO2 22 - 32 mmol/L 21(L) 22 22  Calcium 8.9 - 10.3 mg/dL 9.7 9.8 9.3  Total Protein 6.5 - 8.1 g/dL 6.7 6.8 6.7  Total Bilirubin 0.3 - 1.2 mg/dL 0.4 0.6 0.5  Alkaline Phos 38 - 126 U/L 182(H) 110 127(H)  AST 15 - 41 U/L 32 15 18  ALT 0 - 44 U/L 47(H) 20 23   Component     Latest Ref Rng & Units 05/07/2016  Hepatitis B Surface Ag     Negative Negative  Hep B Core Ab, Tot     Negative Negative  Hep C Virus Ab     0.0 - 0.9 s/co ratio <0.1   . Lab Results  Component Value Date   LDH 213 (H) 03/19/2019         RADIOGRAPHIC STUDIES:   ASSESSMENT & PLAN:   73 y.o. 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  Initial PET/CT showed Scattered small mildly hypermetabolic lymph nodes within the neck, left hilum, left axilla and left groin, potentially related the patient's lymphoma. Focal hypermetabolic activity within the left sternal manubrium and left iliac bone, also potentially related to the patient's lymphoma. No evidence of solid visceral organ  involvement.  CT guided bone marrow biopsy done -- no evidence of lymphoma involving Bone marrow. Normal LDH. No constitutional symptoms. Patient has no significant medical comorbidities at baseline. ECHO with nl EF as expected.  Patient has completed 6 cycles of R CHOP PET/CT s/p 6 cycles of R-CHOP shows complete metabolic response CT chest/abd/pelvis 07/07/2017 - showed no radipgraphic evidence of lymphoma progression at this time.  Lab Results  Component Value Date   LDH 213 (H) 03/19/2019   11/06/2018 CT C/A/P  with results revealing "1. No evidence of recurrent lymphoma. 2.  Aortic atherosclerosis (ICD10-170.0)."   2) Rituxan hypersensitivity (mild grade 1 hives on the back ) - resolved with solumedrol and antihistamines. No issues with dexamethasone pretreatment -Patient will continue dexamethasone premedication to reduce risk of Rituxan hypersensitivity.  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 - Leukocytosis due to Dexamethasone pretreatment.  -Discussed 03/19/2019 LDH is borderline elevated at 213  -Pt is not reporting any constitutional symptoms, but will watch due to borderline elevated LDH -Advised pt to continue to take Vitamin D three times a week, last count from 07/13/18 at 27.38, and eat more dried fruits. -Advised pt to continue to clean her eyelids with baby soap and water twice a day very gently.  -Discussed the upcoming COVID19 vaccine and that we will know more about pt's potential contraindications when it is released.  -The pt has no prohibitive toxicities from continuing her C15 of maintenance Rituxan, with dexamethasone prior to infusion, at this time.  -No live attenuated virus vaccines -Advised crowd avoidance, and recommended infection prevention precautions. She is up to date on her vaccinations. -Continue age appropriate cancer screening and mammograms with PCP -Will see the pt back 2 months with labs  FOLLOW UP: Please schedule 2 doses of maintenance Rituxan with labs and MD visit q64month   The total time spent in the appt was 25 minutes and more than 50% was on counseling and direct patient cares.  All of the patient's questions were answered with apparent satisfaction. The patient knows to call the clinic with any problems, questions or  concerns.   GSullivan LoneMD MPowellAAHIVMS SUniversity Medical Center At PrincetonCWichita Falls Endoscopy CenterHematology/Oncology Physician [facility] (Office):       [phone](Work cell):  [phone](Fax):           [phone] I, JYevette Edwards am acting as a scribe for Dr. GSullivan Lone   .I have reviewed the above documentation for accuracy and completeness, and I agree with the above. .Brunetta GeneraMD

## 2019-03-19 NOTE — Patient Instructions (Signed)
[facility] Discharge Instructions for Patients Receiving Chemotherapy  Today you received the following immunotherapy agent: Rituxumamb     BELOW ARE SYMPTOMS THAT SHOULD BE REPORTED IMMEDIATELY:  *FEVER GREATER THAN 100.5 F  *CHILLS WITH OR WITHOUT FEVER  NAUSEA AND VOMITING THAT IS NOT CONTROLLED WITH YOUR NAUSEA MEDICATION  *UNUSUAL SHORTNESS OF BREATH  *UNUSUAL BRUISING OR BLEEDING  TENDERNESS IN MOUTH AND THROAT WITH OR WITHOUT PRESENCE OF ULCERS  *URINARY PROBLEMS  *BOWEL PROBLEMS  UNUSUAL RASH Items with * indicate a potential emergency and should be followed up as soon as possible.  Feel free to call the clinic should you have any questions or concerns. The clinic phone number is [phone].  Please show the [facility] at check-in to the Emergency Department and triage nurse.   Rituximab injection What is this medicine? RITUXIMAB (ri TUX i mab) is a monoclonal antibody. It is used to treat certain types of cancer like non-Hodgkin lymphoma and chronic lymphocytic leukemia. It is also used to treat rheumatoid arthritis, granulomatosis with polyangiitis (or Wegener's granulomatosis), microscopic polyangiitis, and pemphigus vulgaris. This medicine may be used for other purposes; ask your health care provider or pharmacist if you have questions. COMMON BRAND NAME(S): Rituxan What should I tell my health care provider before I take this medicine? They need to know if you have any of these conditions: -heart disease -infection (especially a virus infection such as hepatitis B, chickenpox, cold sores, or herpes) -immune system problems -irregular heartbeat -kidney disease -low blood counts, like low white cell, platelet, or red cell counts -lung or breathing disease, like asthma -recently received or scheduled to receive a vaccine -an unusual or allergic reaction to rituximab, other medicines, foods, dyes, or preservatives -pregnant or trying  to get pregnant -breast-feeding How should I use this medicine? This medicine is for infusion into a vein. It is administered in a hospital or clinic by a specially trained health care professional. A special MedGuide will be given to you by the pharmacist with each prescription and refill. Be sure to read this information carefully each time. Talk to your pediatrician regarding the use of this medicine in children. This medicine is not approved for use in children. Overdosage: If you think you have taken too much of this medicine contact a poison control center or emergency room at once. NOTE: This medicine is only for you. Do not share this medicine with others. What if I miss a dose? It is important not to miss a dose. Call your doctor or health care professional if you are unable to keep an appointment. What may interact with this medicine? -cisplatin -live virus vaccines This list may not describe all possible interactions. Give your health care provider a list of all the medicines, herbs, non-prescription drugs, or dietary supplements you use. Also tell them if you smoke, drink alcohol, or use illegal drugs. Some items may interact with your medicine. What should I watch for while using this medicine? Your condition will be monitored carefully while you are receiving this medicine. You may need blood work done while you are taking this medicine. This medicine can cause serious allergic reactions. To reduce your risk you may need to take medicine before treatment with this medicine. Take your medicine as directed. In some patients, this medicine may cause a serious brain infection that may cause death. If you have any problems seeing, thinking, speaking, walking, or standing, tell your healthcare professional right away. If you cannot reach  your healthcare professional, urgently seek other source of medical care. Call your doctor or health care professional for advice if you get a fever,  chills or sore throat, or other symptoms of a cold or flu. Do not treat yourself. This drug decreases your body's ability to fight infections. Try to avoid being around people who are sick. Do not become pregnant while taking this medicine or for 12 months after stopping it. Women should inform their doctor if they wish to become pregnant or think they might be pregnant. There is a potential for serious side effects to an unborn child. Talk to your health care professional or pharmacist for more information. Do not breast-feed an infant while taking this medicine or for 6 months after stopping it. What side effects may I notice from receiving this medicine? Side effects that you should report to your doctor or health care professional as soon as possible: -allergic reactions like skin rash, itching or hives; swelling of the face, lips, or tongue -breathing problems -chest pain -changes in vision -diarrhea -headache with fever, neck stiffness, sensitivity to light, nausea, or confusion -fast, irregular heartbeat -loss of memory -low blood counts - this medicine may decrease the number of white blood cells, red blood cells and platelets. You may be at increased risk for infections and bleeding. -mouth sores -problems with balance, talking, or walking -redness, blistering, peeling or loosening of the skin, including inside the mouth -signs of infection - fever or chills, cough, sore throat, pain or difficulty passing urine -signs and symptoms of kidney injury like trouble passing urine or change in the amount of urine -signs and symptoms of liver injury like dark yellow or brown urine; general ill feeling or flu-like symptoms; light-colored stools; loss of appetite; nausea; right upper belly pain; unusually weak or tired; yellowing of the eyes or skin -signs and symptoms of low blood pressure like dizziness; feeling faint or lightheaded, falls; unusually weak or tired -stomach pain -swelling of the  ankles, feet, hands -unusual bleeding or bruising -vomiting Side effects that usually do not require medical attention (report to your doctor or health care professional if they continue or are bothersome): -headache -joint pain -muscle cramps or muscle pain -nausea -tiredness This list may not describe all possible side effects. Call your doctor for medical advice about side effects. You may report side effects to FDA at 1-800-FDA-1088. Where should I keep my medicine? This drug is given in a hospital or clinic and will not be stored at home. NOTE: This sheet is a summary. It may not cover all possible information. If you have questions about this medicine, talk to your doctor, pharmacist, or health care provider.  2019 Elsevier/Gold Standard (2017-04-01 13:04:32)

## 2019-03-19 NOTE — Telephone Encounter (Signed)
Scheduled per 11/16 los, patient will receive updates on My chart.

## 2019-05-10 MED FILL — DEXAMETHASONE 4 MG TABLET: 4 | 2 days supply | Qty: 12 | Fill #1

## 2019-05-18 ENCOUNTER — Encounter: Payer: Self-pay | Admitting: Hematology

## 2019-05-18 NOTE — Progress Notes (Signed)
[name]     HEMATOLOGY/ONCOLOGY CLINIC NOTE  Date of Service:  05/21/19     Patient Care Team: [name], MD as PCP - General (Family Medicine) [name] M.D. (ENT)  CHIEF COMPLAINTS/PURPOSE OF CONSULTATION:   F/u for follicular lymphoma/DLBCL  HISTORY OF PRESENTING ILLNESS:   Please see previous note for details of initial presentation.  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 She has completed 6 cycles of R CHOP and is currently in complete remission. She presents today for her C16 of maintenance Rituxan. The patient's last visit with [place] was on 03/19/2019. The pt reports that she is doing well overall.  The pt reports that she still has mild neuropathy in the toes on her right foot and more severe neuropathy in her left foot. She is also having some muscle cramping in her feet, noting that her big toes curl up involuntarily. This happens more often as she is cooling down after her walks and in the evening when it is quiet and she is reading.   Pt has an appointment scheduled to receive her first dose of the COVID19 vaccine.   Lab results today (05/21/19) of CBC w/diff and CMP is as follows: all values are WNL except for WBC at 10.9K, Neutro Abs at 10.0K, Lymphs Abs at 0.3K, CO2 at 21, Glucose at 136, Alkaline Phosphatase at 146. 05/21/2019 LDH at 182  On review of systems, pt reports numbness/tingling/cramping in her feet and denies spine pain, fevers, chills, night sweats, abdominal pain, leg swelling and any other symptoms.   MEDICAL HISTORY:  Past Medical History:  Diagnosis Date  . Anemia    h/o iron deficiency secondary to heavy menses  . Arthritis    left hand index finger  . 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 Grief reaction 01/27/2016  . H/O measles    as a child  . History of chicken pox    as a child  . History of PCOS   . Hyperlipidemia, mixed 01/27/2016  .  Lymphadenopathy   . 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 Preventative health care 01/27/2016  . Vitamin D deficiency   . Wears glasses     SURGICAL HISTORY: Past Surgical History:  Procedure Laterality Date  . COLONOSCOPY    . IR GENERIC HISTORICAL  05/21/2016   IR FLUORO GUIDE PORT INSERTION RIGHT 05/21/2016 Arne Cleveland, MD [facility]  . IR GENERIC HISTORICAL  05/21/2016   IR US GUIDE VASC ACCESS RIGHT 05/21/2016 Arne Cleveland, MD [facility]  . IR REMOVAL TUN ACCESS W/ PORT W/O FL MOD SED  10/27/2016  . SKIN BIOPSY Left    face  . THYROGLOSSAL DUCT CYST N/A 04/16/2016   Procedure: THYROGLOSSAL DUCT CYST excision;  Surgeon: [name], MD;  Location: [facility];  Service: ENT;  Laterality: N/A;    SOCIAL HISTORY: Social History   Socioeconomic History  . Marital status: Widowed    Spouse name: Not on file  . Number of children: Not on file  . Years of education: Not on file  . Highest education level: Not on file  Occupational History  . Occupation: Psychotherapist  Tobacco Use  . Smoking status: Never Smoker  . Smokeless tobacco: Never Used  Substance and Sexual Activity  . Alcohol use: Yes    Alcohol/week: 3.0 - 6.0 standard drinks    Types: 3 - 6 Standard drinks or equivalent per week  Comment: 5 glasses of wine a week.  . Drug use: No  . Sexual activity: Never    Partners: Male    Birth control/protection: None  Other Topics Concern  . Not on file  Social History Narrative   Married. Education: [employer]. Exercise: walking, yoga, and bicycling daily for 1-2 hours. Lives alone, works as Estate manager/land agent Strain:   . Difficulty of Paying Living Expenses: Not on file  Food Insecurity:   . Worried About Charity fundraiser in the Last Year: Not on file  . Ran Out of Food in the Last Year: Not on file  Transportation Needs:   . Lack of Transportation (Medical): Not on file  . Lack of Transportation  (Non-Medical): Not on file  Physical Activity:   . Days of Exercise per Week: Not on file  . Minutes of Exercise per Session: Not on file  Stress:   . Feeling of Stress : Not on file  Social Connections:   . Frequency of Communication with Friends and Family: Not on file  . Frequency of Social Gatherings with Friends and Family: Not on file  . Attends Religious Services: Not on file  . Active Member of Clubs or Organizations: Not on file  . Attends Archivist Meetings: Not on file  . Marital Status: Not on file  Intimate Partner Violence:   . Fear of Current or Ex-Partner: Not on file  . Emotionally Abused: Not on file  . Physically Abused: Not on file  . Sexually Abused: Not on file    FAMILY HISTORY: Family History  Problem Relation Age of Onset  . Arthritis Mother        rheumatoid  . Heart disease Father        CHF  . COPD Sister        Emphysema, h/o cigarettes  . Other Sister        pituatary tumor  . Arthritis Sister   . Obesity Sister   . Cancer Paternal Grandfather        cancer  . Stroke Maternal Aunt     ALLERGIES:  is allergic to erythromycin and other.  MEDICATIONS:  Current Outpatient Medications  Medication Sig Dispense Refill  . Calcium-Magnesium-Vitamin D (CALCIUM MAGNESIUM PO) Take by mouth.    . Cholecalciferol (VITAMIN D) 2000 units CAPS Take 2,000 Units by mouth daily.    [name] dexamethasone (DECADRON) 4 MG tablet TAKE 3 TABLETS BY MOUTH TWICE DAILY, TAKE 1 DOSE IN THE MORNING AND 1 DOSE IN THE EVENING THE DAY PRIOR TO Rituxan 12 tablet 6  . ELDERBERRY PO Take by mouth.    . Multiple Vitamins-Minerals (ZINC PO) Take by mouth.    . Omega-3 Fatty Acids (OMEGA-3 PO) Take 2 capsules by mouth daily. Omega 3 1280 mg    . vitamin E (VITAMIN E) 180 MG (400 UNITS) capsule Take 400 Units by mouth daily.     No current facility-administered medications for this visit.    REVIEW OF SYSTEMS:   A 10+ POINT REVIEW OF SYSTEMS WAS OBTAINED including  neurology, dermatology, psychiatry, cardiac, respiratory, lymph, extremities, GI, GU, Musculoskeletal, constitutional, breasts, reproductive, HEENT.  All pertinent positives are noted in the HPI.  All others are negative.   PHYSICAL EXAMINATION:  ECOG PERFORMANCE STATUS: 0 - Asymptomatic  Vitals:   05/21/19 0851  BP: 134/64  Pulse: 60  Resp: 17  Temp: 97.8 F (36.6 C)  SpO2: 100%  Filed Weights   05/21/19 0851  Weight: 161 lb 11.2 oz (73.3 kg)   .Body mass index is 23.88 kg/m.   GENERAL:alert, in no acute distress and comfortable SKIN: no acute rashes, no significant lesions EYES: conjunctiva are pink and non-injected, sclera anicteric OROPHARYNX: MMM, no exudates, no oropharyngeal erythema or ulceration NECK: supple, no JVD LYMPH:  no palpable lymphadenopathy in the cervical, axillary or inguinal regions LUNGS: clear to auscultation b/l with normal respiratory effort HEART: regular rate & rhythm ABDOMEN:  normoactive bowel sounds , non tender, not distended. No palpable hepatosplenomegaly.  Extremity: no pedal edema PSYCH: alert & oriented x 3 with fluent speech NEURO: no focal motor/sensory deficits  LABORATORY DATA:  I have reviewed the data as listed  . CBC Latest Ref Rng & Units 05/21/2019 03/19/2019 01/15/2019  WBC 4.0 - 10.5 K/uL 10.9(H) 13.9(H) 9.5  Hemoglobin 12.0 - 15.0 g/dL 12.9 13.1 13.0  Hematocrit 36.0 - 46.0 % 38.0 38.8 38.2  Platelets 150 - 400 K/uL 282 267 276   CBC    Component Value Date/Time   WBC 10.9 (H) 05/21/2019 0817   RBC 4.14 05/21/2019 0817   HGB 12.9 05/21/2019 0817   HGB 13.9 05/10/2018 0946   HGB 13.3 03/07/2017 0842   HCT 38.0 05/21/2019 0817   HCT 39.1 03/07/2017 0842   PLT 282 05/21/2019 0817   PLT 272 05/10/2018 0946   PLT 283 03/07/2017 0842   MCV 91.8 05/21/2019 0817   MCV 91.4 03/07/2017 0842   MCH 31.2 05/21/2019 0817   MCHC 33.9 05/21/2019 0817   RDW 12.5 05/21/2019 0817   RDW 13.3 03/07/2017 0842   LYMPHSABS 0.3  (L) 05/21/2019 0817   LYMPHSABS 0.4 (L) 03/07/2017 0842   MONOABS 0.6 05/21/2019 0817   MONOABS 0.7 03/07/2017 0842   EOSABS 0.0 05/21/2019 0817   EOSABS 0.0 03/07/2017 0842   BASOSABS 0.0 05/21/2019 0817   BASOSABS 0.0 03/07/2017 0842    . CMP Latest Ref Rng & Units 05/21/2019 03/19/2019 01/15/2019  Glucose 70 - 99 mg/dL 136(H) 146(H) 122(H)  BUN 8 - 23 mg/dL _0 Creatinine 0.44 - 1.00 mg/dL 0.75 0.72 0.75  Sodium 135 - 145 mmol/L 138 140 138  Potassium 3.5 - 5.1 mmol/L 4.1 4.0 4.2  Chloride 98 - 111 mmol/L 106 107 105  CO2 22 - 32 mmol/L 21(L) 21(L) 22  Calcium 8.9 - 10.3 mg/dL 9.2 9.7 9.8  Total Protein 6.5 - 8.1 g/dL 7.0 6.7 6.8  Total Bilirubin 0.3 - 1.2 mg/dL 0.5 0.4 0.6  Alkaline Phos 38 - 126 U/L 146(H) 182(H) 110  AST 15 - 41 U/L 18 32 15  ALT 0 - 44 U/L 24 47(H) 20   Component     Latest Ref Rng & Units 05/07/2016  Hepatitis B Surface Ag     Negative Negative  Hep B Core Ab, Tot     Negative Negative  Hep C Virus Ab     0.0 - 0.9 s/co ratio <0.1   . Lab Results  Component Value Date   LDH 182 05/21/2019         RADIOGRAPHIC STUDIES:   ASSESSMENT & PLAN:   74 y.o. 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  Initial PET/CT showed Scattered small mildly hypermetabolic lymph nodes within the neck, left hilum, left axilla and left groin, potentially related the patient's lymphoma. Focal hypermetabolic  activity within the left sternal manubrium and left iliac bone, also potentially related to the patient's lymphoma. No evidence of solid visceral organ involvement.  CT guided bone marrow biopsy done -- no evidence of lymphoma involving Bone marrow. Normal LDH. No constitutional symptoms. Patient has no significant medical comorbidities at baseline. ECHO with nl EF as expected.  Patient has completed 6 cycles of R CHOP PET/CT s/p 6 cycles of  R-CHOP shows complete metabolic response CT chest/abd/pelvis 07/07/2017 - showed no radipgraphic evidence of lymphoma progression at this time.  Lab Results  Component Value Date   LDH 182 05/21/2019   11/06/2018 CT C/A/P with results revealing "1. No evidence of recurrent lymphoma. 2.  Aortic atherosclerosis (ICD10-170.0)."   2) Rituxan hypersensitivity (mild grade 1 hives on the back ) - resolved with solumedrol and antihistamines. No issues with dexamethasone pretreatment -Patient will continue dexamethasone premedication to reduce risk of Rituxan hypersensitivity.  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 -Discussed 05/21/2019 LDH is WNL at 182 -Leukocytosis due to Dexamethasone pretreatment.?? - has improved since 03/19/2019 -Advised pt to continue to take Vitamin D three times a week, last count from 07/13/18 at 27.38, and eat more dried fruits. -Recommended pt continue to drink 48-64 oz of water, intake electrolytes and massage areas of numbness/tingling -The pt has no prohibitive toxicities from continuing her C16 of maintenance Rituxan, with Dexamethasone prior to infusion, at this time.  -Advised crowd avoidance, and recommended infection prevention precautions. She is up to date on her vaccinations. -Recommended pt f/u as scheduled for COVID19 vaccine   -Plan to complete 18 cycles (3 years) of maintenance Rituxan  -Will complete rpt imaging after 18 cycles  -Plan to watch with labs and clinic visits after 18 cycles, depending on rpt PET/CT scan -Continue age appropriate cancer screening and mammograms with PCP -Will see the pt back in 4 weeks with labs   FOLLOW UP: Return to clinic for next cycle of maintenance Rituxan with labs and MD visit as scheduled on  07/23/2019   The total time spent in the appt was 30 minutes and more than 50% was on counseling and direct patient cares.  All of the patient's questions were answered with apparent satisfaction. The patient knows to call the clinic with any problems, questions or concerns.   Sullivan Lone MD [place] AAHIVMS [facility] Hematology/Oncology Physician [facility]  (Office):       [phone] (Work cell):  [phone] (Fax):           [phone]  I, Yevette Edwards, am acting as a scribe for Dr. Sullivan Lone.   .I have reviewed the above documentation for accuracy and completeness, and I agree with the above. Brunetta Genera MD

## 2019-05-21 ENCOUNTER — Other Ambulatory Visit: Payer: Self-pay

## 2019-05-21 ENCOUNTER — Inpatient Hospital Stay (HOSPITAL_BASED_OUTPATIENT_CLINIC_OR_DEPARTMENT_OTHER): Payer: Medicare Other | Admitting: Hematology

## 2019-05-21 ENCOUNTER — Inpatient Hospital Stay: Payer: Medicare Other

## 2019-05-21 ENCOUNTER — Inpatient Hospital Stay: Payer: Medicare Other | Attending: Hematology

## 2019-05-21 VITALS — BP 134/64 | HR 60 | Temp 97.8°F | Resp 17 | Ht 69.0 in | Wt 161.7 lb

## 2019-05-21 VITALS — BP 106/56 | HR 57 | Temp 98.5°F | Resp 16

## 2019-05-21 DIAGNOSIS — G629 Polyneuropathy, unspecified: Secondary | ICD-10-CM | POA: Insufficient documentation

## 2019-05-21 DIAGNOSIS — Z5112 Encounter for antineoplastic immunotherapy: Secondary | ICD-10-CM | POA: Diagnosis not present

## 2019-05-21 DIAGNOSIS — Z8261 Family history of arthritis: Secondary | ICD-10-CM | POA: Diagnosis not present

## 2019-05-21 DIAGNOSIS — R252 Cramp and spasm: Secondary | ICD-10-CM | POA: Diagnosis not present

## 2019-05-21 DIAGNOSIS — I700 Atherosclerosis of aorta: Secondary | ICD-10-CM | POA: Insufficient documentation

## 2019-05-21 DIAGNOSIS — C8298 Follicular lymphoma, unspecified, lymph nodes of multiple sites: Secondary | ICD-10-CM | POA: Diagnosis not present

## 2019-05-21 DIAGNOSIS — Z79899 Other long term (current) drug therapy: Secondary | ICD-10-CM | POA: Diagnosis not present

## 2019-05-21 DIAGNOSIS — E785 Hyperlipidemia, unspecified: Secondary | ICD-10-CM | POA: Insufficient documentation

## 2019-05-21 DIAGNOSIS — Z7289 Other problems related to lifestyle: Secondary | ICD-10-CM | POA: Insufficient documentation

## 2019-05-21 DIAGNOSIS — Z809 Family history of malignant neoplasm, unspecified: Secondary | ICD-10-CM | POA: Diagnosis not present

## 2019-05-21 DIAGNOSIS — Z823 Family history of stroke: Secondary | ICD-10-CM | POA: Insufficient documentation

## 2019-05-21 DIAGNOSIS — Z836 Family history of other diseases of the respiratory system: Secondary | ICD-10-CM | POA: Diagnosis not present

## 2019-05-21 DIAGNOSIS — Z7189 Other specified counseling: Secondary | ICD-10-CM

## 2019-05-21 DIAGNOSIS — Z8249 Family history of ischemic heart disease and other diseases of the circulatory system: Secondary | ICD-10-CM | POA: Diagnosis not present

## 2019-05-21 DIAGNOSIS — C8338 Diffuse large B-cell lymphoma, lymph nodes of multiple sites: Secondary | ICD-10-CM | POA: Insufficient documentation

## 2019-05-21 DIAGNOSIS — E559 Vitamin D deficiency, unspecified: Secondary | ICD-10-CM | POA: Diagnosis not present

## 2019-05-21 DIAGNOSIS — C8330 Diffuse large B-cell lymphoma, unspecified site: Secondary | ICD-10-CM

## 2019-05-21 LAB — CBC WITH DIFFERENTIAL/PLATELET
Abs Immature Granulocytes: 0.04 10*3/uL (ref 0.00–0.07)
Basophils Absolute: 0 10*3/uL (ref 0.0–0.1)
Basophils Relative: 0 %
Eosinophils Absolute: 0 10*3/uL (ref 0.0–0.5)
Eosinophils Relative: 0 %
HCT: 38 % (ref 36.0–46.0)
Hemoglobin: 12.9 g/dL (ref 12.0–15.0)
Immature Granulocytes: 0 %
Lymphocytes Relative: 3 %
Lymphs Abs: 0.3 10*3/uL — ABNORMAL LOW (ref 0.7–4.0)
MCH: 31.2 pg (ref 26.0–34.0)
MCHC: 33.9 g/dL (ref 30.0–36.0)
MCV: 91.8 fL (ref 80.0–100.0)
Monocytes Absolute: 0.6 10*3/uL (ref 0.1–1.0)
Monocytes Relative: 5 %
Neutro Abs: 10 10*3/uL — ABNORMAL HIGH (ref 1.7–7.7)
Neutrophils Relative %: 92 %
Platelets: 282 10*3/uL (ref 150–400)
RBC: 4.14 MIL/uL (ref 3.87–5.11)
RDW: 12.5 % (ref 11.5–15.5)
WBC: 10.9 10*3/uL — ABNORMAL HIGH (ref 4.0–10.5)
nRBC: 0 % (ref 0.0–0.2)

## 2019-05-21 LAB — CMP (CANCER CENTER ONLY)
ALT: 24 U/L (ref 0–44)
AST: 18 U/L (ref 15–41)
Albumin: 4.4 g/dL (ref 3.5–5.0)
Alkaline Phosphatase: 146 U/L — ABNORMAL HIGH (ref 38–126)
Anion gap: 11 (ref 5–15)
BUN: 17 mg/dL (ref 8–23)
CO2: 21 mmol/L — ABNORMAL LOW (ref 22–32)
Calcium: 9.2 mg/dL (ref 8.9–10.3)
Chloride: 106 mmol/L (ref 98–111)
Creatinine: 0.75 mg/dL (ref 0.44–1.00)
GFR, Est AFR Am: >60 mL/min (ref >60)
GFR, Estimated: >60 mL/min (ref >60)
Glucose, Bld: 136 mg/dL — ABNORMAL HIGH (ref 70–99)
Potassium: 4.1 mmol/L (ref 3.5–5.1)
Sodium: 138 mmol/L (ref 135–145)
Total Bilirubin: 0.5 mg/dL (ref 0.3–1.2)
Total Protein: 7 g/dL (ref 6.5–8.1)

## 2019-05-21 LAB — LACTATE DEHYDROGENASE: LDH: 182 U/L (ref 98–192)

## 2019-05-21 MED ORDER — DIPHENHYDRAMINE HCL 25 MG PO CAPS
50.0000 mg | ORAL_CAPSULE | Freq: Once | ORAL | Status: AC
  Administered 2019-05-21: 50 mg via ORAL

## 2019-05-21 MED ORDER — ACETAMINOPHEN 325 MG PO TABS
650.0000 mg | ORAL_TABLET | Freq: Once | ORAL | Status: AC
  Administered 2019-05-21: 10:00:00 650 mg via ORAL

## 2019-05-21 MED ORDER — SODIUM CHLORIDE 0.9 % IV SOLN
375.0000 mg/m2 | Freq: Once | INTRAVENOUS | Status: AC
  Administered 2019-05-21: 700 mg via INTRAVENOUS
  Filled 2019-05-21: qty 50

## 2019-05-21 MED ORDER — SODIUM CHLORIDE 0.9 % IV SOLN
Freq: Once | INTRAVENOUS | Status: AC
  Filled 2019-05-21: qty 250

## 2019-05-21 MED ORDER — DIPHENHYDRAMINE HCL 25 MG PO CAPS
ORAL_CAPSULE | ORAL | Status: AC
  Filled 2019-05-21: qty 2

## 2019-05-21 MED ORDER — ACETAMINOPHEN 325 MG PO TABS
ORAL_TABLET | ORAL | Status: AC
  Filled 2019-05-21: qty 2

## 2019-05-21 MED ORDER — SODIUM CHLORIDE 0.9 % IV SOLN
12.0000 mg | Freq: Once | INTRAVENOUS | Status: AC
  Administered 2019-05-21: 12 mg via INTRAVENOUS
  Filled 2019-05-21: qty 1.2

## 2019-05-21 NOTE — Patient Instructions (Signed)
[facility] Discharge Instructions for Patients Receiving Chemotherapy  Today you received the following immunotherapy agent: Rituxumamb     BELOW ARE SYMPTOMS THAT SHOULD BE REPORTED IMMEDIATELY:  *FEVER GREATER THAN 100.5 F  *CHILLS WITH OR WITHOUT FEVER  NAUSEA AND VOMITING THAT IS NOT CONTROLLED WITH YOUR NAUSEA MEDICATION  *UNUSUAL SHORTNESS OF BREATH  *UNUSUAL BRUISING OR BLEEDING  TENDERNESS IN MOUTH AND THROAT WITH OR WITHOUT PRESENCE OF ULCERS  *URINARY PROBLEMS  *BOWEL PROBLEMS  UNUSUAL RASH Items with * indicate a potential emergency and should be followed up as soon as possible.  Feel free to call the clinic should you have any questions or concerns. The clinic phone number is [phone].  Please show the [facility] at check-in to the Emergency Department and triage nurse.   Rituximab injection What is this medicine? RITUXIMAB (ri TUX i mab) is a monoclonal antibody. It is used to treat certain types of cancer like non-Hodgkin lymphoma and chronic lymphocytic leukemia. It is also used to treat rheumatoid arthritis, granulomatosis with polyangiitis (or Wegener's granulomatosis), microscopic polyangiitis, and pemphigus vulgaris. This medicine may be used for other purposes; ask your health care provider or pharmacist if you have questions. COMMON BRAND NAME(S): Rituxan What should I tell my health care provider before I take this medicine? They need to know if you have any of these conditions: -heart disease -infection (especially a virus infection such as hepatitis B, chickenpox, cold sores, or herpes) -immune system problems -irregular heartbeat -kidney disease -low blood counts, like low white cell, platelet, or red cell counts -lung or breathing disease, like asthma -recently received or scheduled to receive a vaccine -an unusual or allergic reaction to rituximab, other medicines, foods, dyes, or preservatives -pregnant or trying  to get pregnant -breast-feeding How should I use this medicine? This medicine is for infusion into a vein. It is administered in a hospital or clinic by a specially trained health care professional. A special MedGuide will be given to you by the pharmacist with each prescription and refill. Be sure to read this information carefully each time. Talk to your pediatrician regarding the use of this medicine in children. This medicine is not approved for use in children. Overdosage: If you think you have taken too much of this medicine contact a poison control center or emergency room at once. NOTE: This medicine is only for you. Do not share this medicine with others. What if I miss a dose? It is important not to miss a dose. Call your doctor or health care professional if you are unable to keep an appointment. What may interact with this medicine? -cisplatin -live virus vaccines This list may not describe all possible interactions. Give your health care provider a list of all the medicines, herbs, non-prescription drugs, or dietary supplements you use. Also tell them if you smoke, drink alcohol, or use illegal drugs. Some items may interact with your medicine. What should I watch for while using this medicine? Your condition will be monitored carefully while you are receiving this medicine. You may need blood work done while you are taking this medicine. This medicine can cause serious allergic reactions. To reduce your risk you may need to take medicine before treatment with this medicine. Take your medicine as directed. In some patients, this medicine may cause a serious brain infection that may cause death. If you have any problems seeing, thinking, speaking, walking, or standing, tell your healthcare professional right away. If you cannot reach  your healthcare professional, urgently seek other source of medical care. Call your doctor or health care professional for advice if you get a fever,  chills or sore throat, or other symptoms of a cold or flu. Do not treat yourself. This drug decreases your body's ability to fight infections. Try to avoid being around people who are sick. Do not become pregnant while taking this medicine or for 12 months after stopping it. Women should inform their doctor if they wish to become pregnant or think they might be pregnant. There is a potential for serious side effects to an unborn child. Talk to your health care professional or pharmacist for more information. Do not breast-feed an infant while taking this medicine or for 6 months after stopping it. What side effects may I notice from receiving this medicine? Side effects that you should report to your doctor or health care professional as soon as possible: -allergic reactions like skin rash, itching or hives; swelling of the face, lips, or tongue -breathing problems -chest pain -changes in vision -diarrhea -headache with fever, neck stiffness, sensitivity to light, nausea, or confusion -fast, irregular heartbeat -loss of memory -low blood counts - this medicine may decrease the number of white blood cells, red blood cells and platelets. You may be at increased risk for infections and bleeding. -mouth sores -problems with balance, talking, or walking -redness, blistering, peeling or loosening of the skin, including inside the mouth -signs of infection - fever or chills, cough, sore throat, pain or difficulty passing urine -signs and symptoms of kidney injury like trouble passing urine or change in the amount of urine -signs and symptoms of liver injury like dark yellow or brown urine; general ill feeling or flu-like symptoms; light-colored stools; loss of appetite; nausea; right upper belly pain; unusually weak or tired; yellowing of the eyes or skin -signs and symptoms of low blood pressure like dizziness; feeling faint or lightheaded, falls; unusually weak or tired -stomach pain -swelling of the  ankles, feet, hands -unusual bleeding or bruising -vomiting Side effects that usually do not require medical attention (report to your doctor or health care professional if they continue or are bothersome): -headache -joint pain -muscle cramps or muscle pain -nausea -tiredness This list may not describe all possible side effects. Call your doctor for medical advice about side effects. You may report side effects to FDA at 1-800-FDA-1088. Where should I keep my medicine? This drug is given in a hospital or clinic and will not be stored at home. NOTE: This sheet is a summary. It may not cover all possible information. If you have questions about this medicine, talk to your doctor, pharmacist, or health care provider.  2019 Elsevier/Gold Standard (2017-04-01 13:04:32)

## 2019-05-22 ENCOUNTER — Telehealth: Payer: Self-pay | Admitting: Hematology

## 2019-05-22 NOTE — Telephone Encounter (Signed)
Appt already scheduled per 1.18 los.

## 2019-05-29 ENCOUNTER — Ambulatory Visit: Payer: Medicare Other

## 2019-05-30 ENCOUNTER — Ambulatory Visit: Payer: Medicare Other

## 2019-06-07 ENCOUNTER — Ambulatory Visit: Payer: Medicare Other | Attending: Internal Medicine

## 2019-06-07 DIAGNOSIS — Z23 Encounter for immunization: Secondary | ICD-10-CM

## 2019-06-07 NOTE — Progress Notes (Signed)
   Covid-19 Vaccination Clinic  Name:  Lindsay Harrison    MRN: [id number] DOB: [birth year]  06/07/2019  [name] was observed post Covid-19 immunization for 15 minutes without incidence. She was provided with Vaccine Information Sheet and instruction to access the V-Safe system.   [name] was instructed to call 911 with any severe reactions post vaccine: [name] Difficulty breathing  . Swelling of your face and throat  . A fast heartbeat  . A bad rash all over your body  . Dizziness and weakness    Immunizations Administered    Name Date Dose VIS Date Route   Pfizer COVID-19 Vaccine 06/07/2019  4:40 PM 0.3 mL 04/13/2019 Intramuscular   Manufacturer: [place]   Lot: [id number]   [place]: [id number]

## 2019-06-10 ENCOUNTER — Ambulatory Visit: Payer: Medicare Other

## 2019-06-19 ENCOUNTER — Ambulatory Visit: Payer: Medicare Other

## 2019-07-02 ENCOUNTER — Ambulatory Visit: Payer: Medicare Other | Attending: Internal Medicine

## 2019-07-02 DIAGNOSIS — Z23 Encounter for immunization: Secondary | ICD-10-CM

## 2019-07-02 NOTE — Progress Notes (Signed)
   Covid-19 Vaccination Clinic  Name:  Lindsay Harrison    MRN: [id number] DOB: 12/01/1945  07/02/2019  [name] was observed post Covid-19 immunization for 15 minutes without incidence. She was provided with Vaccine Information Sheet and instruction to access the V-Safe system.   [name] was instructed to call 911 with any severe reactions post vaccine: [name] Difficulty breathing  . Swelling of your face and throat  . A fast heartbeat  . A bad rash all over your body  . Dizziness and weakness    Immunizations Administered    Name Date Dose VIS Date Route   Pfizer COVID-19 Vaccine 07/02/2019  3:26 PM 0.3 mL 04/13/2019 Intramuscular   Manufacturer: [place]   Lot: [id number]   [place]: [id number]

## 2019-07-23 ENCOUNTER — Telehealth: Payer: Self-pay | Admitting: Hematology

## 2019-07-23 ENCOUNTER — Inpatient Hospital Stay (HOSPITAL_BASED_OUTPATIENT_CLINIC_OR_DEPARTMENT_OTHER): Payer: Medicare Other | Admitting: Hematology

## 2019-07-23 ENCOUNTER — Other Ambulatory Visit: Payer: Self-pay

## 2019-07-23 ENCOUNTER — Inpatient Hospital Stay: Payer: Medicare Other | Attending: Hematology

## 2019-07-23 ENCOUNTER — Inpatient Hospital Stay: Payer: Medicare Other

## 2019-07-23 VITALS — BP 107/58 | HR 58 | Temp 98.9°F | Resp 20

## 2019-07-23 VITALS — BP 117/80 | HR 63 | Temp 98.2°F | Resp 18 | Ht 69.0 in | Wt 160.5 lb

## 2019-07-23 DIAGNOSIS — Z7289 Other problems related to lifestyle: Secondary | ICD-10-CM | POA: Insufficient documentation

## 2019-07-23 DIAGNOSIS — Z5112 Encounter for antineoplastic immunotherapy: Secondary | ICD-10-CM | POA: Diagnosis not present

## 2019-07-23 DIAGNOSIS — C8298 Follicular lymphoma, unspecified, lymph nodes of multiple sites: Secondary | ICD-10-CM

## 2019-07-23 DIAGNOSIS — T451X5A Adverse effect of antineoplastic and immunosuppressive drugs, initial encounter: Secondary | ICD-10-CM | POA: Insufficient documentation

## 2019-07-23 DIAGNOSIS — G629 Polyneuropathy, unspecified: Secondary | ICD-10-CM | POA: Diagnosis not present

## 2019-07-23 DIAGNOSIS — E559 Vitamin D deficiency, unspecified: Secondary | ICD-10-CM | POA: Diagnosis not present

## 2019-07-23 DIAGNOSIS — Z836 Family history of other diseases of the respiratory system: Secondary | ICD-10-CM | POA: Insufficient documentation

## 2019-07-23 DIAGNOSIS — E785 Hyperlipidemia, unspecified: Secondary | ICD-10-CM | POA: Insufficient documentation

## 2019-07-23 DIAGNOSIS — C8338 Diffuse large B-cell lymphoma, lymph nodes of multiple sites: Secondary | ICD-10-CM | POA: Diagnosis not present

## 2019-07-23 DIAGNOSIS — Z8249 Family history of ischemic heart disease and other diseases of the circulatory system: Secondary | ICD-10-CM | POA: Diagnosis not present

## 2019-07-23 DIAGNOSIS — Z79899 Other long term (current) drug therapy: Secondary | ICD-10-CM | POA: Insufficient documentation

## 2019-07-23 DIAGNOSIS — L509 Urticaria, unspecified: Secondary | ICD-10-CM | POA: Insufficient documentation

## 2019-07-23 DIAGNOSIS — Z8261 Family history of arthritis: Secondary | ICD-10-CM | POA: Insufficient documentation

## 2019-07-23 DIAGNOSIS — Z7189 Other specified counseling: Secondary | ICD-10-CM

## 2019-07-23 DIAGNOSIS — Z823 Family history of stroke: Secondary | ICD-10-CM | POA: Insufficient documentation

## 2019-07-23 DIAGNOSIS — Z809 Family history of malignant neoplasm, unspecified: Secondary | ICD-10-CM | POA: Insufficient documentation

## 2019-07-23 LAB — CMP (CANCER CENTER ONLY)
ALT: 19 U/L (ref 0–44)
AST: 15 U/L (ref 15–41)
Albumin: 4.2 g/dL (ref 3.5–5.0)
Alkaline Phosphatase: 120 U/L (ref 38–126)
Anion gap: 12 (ref 5–15)
BUN: 22 mg/dL (ref 8–23)
CO2: 22 mmol/L (ref 22–32)
Calcium: 9.8 mg/dL (ref 8.9–10.3)
Chloride: 104 mmol/L (ref 98–111)
Creatinine: 0.76 mg/dL (ref 0.44–1.00)
GFR, Est AFR Am: >60 mL/min (ref >60)
GFR, Estimated: >60 mL/min (ref >60)
Glucose, Bld: 132 mg/dL — ABNORMAL HIGH (ref 70–99)
Potassium: 4 mmol/L (ref 3.5–5.1)
Sodium: 138 mmol/L (ref 135–145)
Total Bilirubin: 0.5 mg/dL (ref 0.3–1.2)
Total Protein: 6.8 g/dL (ref 6.5–8.1)

## 2019-07-23 LAB — CBC WITH DIFFERENTIAL/PLATELET
Abs Immature Granulocytes: 0.02 10*3/uL (ref 0.00–0.07)
Basophils Absolute: 0 10*3/uL (ref 0.0–0.1)
Basophils Relative: 0 %
Eosinophils Absolute: 0 10*3/uL (ref 0.0–0.5)
Eosinophils Relative: 0 %
HCT: 38.6 % (ref 36.0–46.0)
Hemoglobin: 13.3 g/dL (ref 12.0–15.0)
Immature Granulocytes: 0 %
Lymphocytes Relative: 3 %
Lymphs Abs: 0.3 10*3/uL — ABNORMAL LOW (ref 0.7–4.0)
MCH: 31.5 pg (ref 26.0–34.0)
MCHC: 34.5 g/dL (ref 30.0–36.0)
MCV: 91.5 fL (ref 80.0–100.0)
Monocytes Absolute: 0.6 10*3/uL (ref 0.1–1.0)
Monocytes Relative: 7 %
Neutro Abs: 8.2 10*3/uL — ABNORMAL HIGH (ref 1.7–7.7)
Neutrophils Relative %: 90 %
Platelets: 272 10*3/uL (ref 150–400)
RBC: 4.22 MIL/uL (ref 3.87–5.11)
RDW: 12.4 % (ref 11.5–15.5)
WBC: 9.1 10*3/uL (ref 4.0–10.5)
nRBC: 0 % (ref 0.0–0.2)

## 2019-07-23 LAB — LACTATE DEHYDROGENASE: LDH: 199 U/L — ABNORMAL HIGH (ref 98–192)

## 2019-07-23 MED ORDER — ACETAMINOPHEN 325 MG PO TABS
ORAL_TABLET | ORAL | Status: AC
  Filled 2019-07-23: qty 2

## 2019-07-23 MED ORDER — SODIUM CHLORIDE 0.9% FLUSH
10.0000 mL | INTRAVENOUS | Status: DC | PRN
  Filled 2019-07-23: qty 10

## 2019-07-23 MED ORDER — HEPARIN SOD (PORK) LOCK FLUSH 100 UNIT/ML IV SOLN
500.0000 [IU] | Freq: Once | INTRAVENOUS | Status: DC | PRN
  Filled 2019-07-23: qty 5

## 2019-07-23 MED ORDER — SODIUM CHLORIDE 0.9 % IV SOLN
12.0000 mg | Freq: Once | INTRAVENOUS | Status: AC
  Administered 2019-07-23: 12 mg via INTRAVENOUS
  Filled 2019-07-23: qty 1.2

## 2019-07-23 MED ORDER — SODIUM CHLORIDE 0.9 % IV SOLN
375.0000 mg/m2 | Freq: Once | INTRAVENOUS | Status: AC
  Administered 2019-07-23: 700 mg via INTRAVENOUS
  Filled 2019-07-23: qty 50

## 2019-07-23 MED ORDER — DIPHENHYDRAMINE HCL 25 MG PO CAPS
50.0000 mg | ORAL_CAPSULE | Freq: Once | ORAL | Status: AC
  Administered 2019-07-23: 50 mg via ORAL

## 2019-07-23 MED ORDER — DIPHENHYDRAMINE HCL 25 MG PO CAPS
ORAL_CAPSULE | ORAL | Status: AC
  Filled 2019-07-23: qty 2

## 2019-07-23 MED ORDER — SODIUM CHLORIDE 0.9 % IV SOLN
Freq: Once | INTRAVENOUS | Status: AC
  Filled 2019-07-23: qty 250

## 2019-07-23 MED ORDER — ACETAMINOPHEN 325 MG PO TABS
650.0000 mg | ORAL_TABLET | Freq: Once | ORAL | Status: AC
  Administered 2019-07-23: 650 mg via ORAL

## 2019-07-23 NOTE — Telephone Encounter (Signed)
Scheduled per 3/22 los. Confirmed that pt will get a printout from tx area

## 2019-07-23 NOTE — Progress Notes (Signed)
Lindsay Harrison Kitchen     HEMATOLOGY/ONCOLOGY CLINIC NOTE  Date of Service:  07/23/19     Patient Care Team: [name], MD as PCP - General (Family Medicine) [name] M.D. (ENT)  CHIEF COMPLAINTS/PURPOSE OF CONSULTATION:   F/u for follicular lymphoma/DLBCL  HISTORY OF PRESENTING ILLNESS:   Please see previous note for details of initial presentation.  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 She has completed 6 cycles of R CHOP and is currently in complete remission. She presents today for her C17 of maintenance Rituxan. The patient's last visit with [place] was on 05/21/2019. The pt reports that she is doing well overall.  The pt reports that she has received both doses of the COVID19 vaccine and has tolerated them well. She has been feeling good and has continued walking up to 6-7 miles and doing her Tai chi daily. She has been having some sinus symptoms due to the springtime pollen, but denies any other concerns.   Lab results today (07/23/19) of CBC w/diff and CMP is as follows: all values are WNL except for Neutro Abs at 8.2K, Lymphs Abs at 0.3K, Glucose at 132. 07/23/2019 LDH at 199  On review of systems, pt reports nasal congestion and denies abdominal pain, diarrhea, constipation and any other symptoms.   MEDICAL HISTORY:  Past Medical History:  Diagnosis Date  . Anemia    h/o iron deficiency secondary to heavy menses  . Arthritis    left hand index finger  . 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 Grief reaction 01/27/2016  . H/O measles    as a child  . History of chicken pox    as a child  . History of PCOS   . Hyperlipidemia, mixed 01/27/2016  . Lymphadenopathy   . 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 Preventative health care 01/27/2016  . Vitamin D deficiency   . Wears glasses     SURGICAL HISTORY: Past Surgical History:  Procedure Laterality Date  . COLONOSCOPY    . IR GENERIC  HISTORICAL  05/21/2016   IR FLUORO GUIDE PORT INSERTION RIGHT 05/21/2016 Arne [place], MD [facility]  . IR GENERIC HISTORICAL  05/21/2016   IR US GUIDE VASC ACCESS RIGHT 05/21/2016 Arne [place], MD [facility]  . IR REMOVAL TUN ACCESS W/ PORT W/O FL MOD SED  10/27/2016  . SKIN BIOPSY Left    face  . THYROGLOSSAL DUCT CYST N/A 04/16/2016   Procedure: THYROGLOSSAL DUCT CYST excision;  Surgeon: [name], MD;  Location: [facility];  Service: ENT;  Laterality: N/A;    SOCIAL HISTORY: Social History   Socioeconomic History  . Marital status: Widowed    Spouse name: Not on file  . Number of children: Not on file  . Years of education: Not on file  . Highest education level: Not on file  Occupational History  . Occupation: Psychotherapist  Tobacco Use  . Smoking status: Never Smoker  . Smokeless tobacco: Never Used  Substance and Sexual Activity  . Alcohol use: Yes    Alcohol/week: 3.0 - 6.0 standard drinks    Types: 3 - 6 Standard drinks or equivalent per week    Comment: 5 glasses of wine a week.  . Drug use: No  . Sexual activity: Never    Partners: Male    Birth control/protection: None  Other Topics Concern  . Not on file  Social History Narrative   Married.  Education: [employer]. Exercise: walking, yoga, and bicycling daily for 1-2 hours. Lives alone, works as Estate manager/land agent Strain:   . Difficulty of Paying Living Expenses:   Food Insecurity:   . Worried About Charity fundraiser in the Last Year:   . Arboriculturist in the Last Year:   Transportation Needs:   . Film/video editor (Medical):   Lindsay Harrison Kitchen Lack of Transportation (Non-Medical):   Physical Activity:   . Days of Exercise per Week:   . Minutes of Exercise per Session:   Stress:   . Feeling of Stress :   Social Connections:   . Frequency of Communication with Friends and Family:   . Frequency of Social Gatherings with Friends and Family:   . Attends  Religious Services:   . Active Member of Clubs or Organizations:   . Attends Archivist Meetings:   Lindsay Harrison Kitchen Marital Status:   Intimate Partner Violence:   . Fear of Current or Ex-Partner:   . Emotionally Abused:   Lindsay Harrison Kitchen Physically Abused:   . Sexually Abused:     FAMILY HISTORY: Family History  Problem Relation Age of Onset  . Arthritis Mother        rheumatoid  . Heart disease Father        CHF  . COPD Sister        Emphysema, h/o cigarettes  . Other Sister        pituatary tumor  . Arthritis Sister   . Obesity Sister   . Cancer Paternal Grandfather        cancer  . Stroke Maternal Aunt     ALLERGIES:  is allergic to erythromycin and other.  MEDICATIONS:  Current Outpatient Medications  Medication Sig Dispense Refill  . Calcium-Magnesium-Vitamin D (CALCIUM MAGNESIUM PO) Take by mouth.    . Cholecalciferol (VITAMIN D) 2000 units CAPS Take 2,000 Units by mouth daily.    Lindsay Harrison Kitchen dexamethasone (DECADRON) 4 MG tablet TAKE 3 TABLETS BY MOUTH TWICE DAILY, TAKE 1 DOSE IN THE MORNING AND 1 DOSE IN THE EVENING THE DAY PRIOR TO Rituxan 12 tablet 6  . ELDERBERRY PO Take by mouth.    . Multiple Vitamins-Minerals (ZINC PO) Take by mouth.    . Omega-3 Fatty Acids (FISH OIL) 1000 MG CAPS Take by mouth.    . Omega-3 Fatty Acids (OMEGA-3 PO) Take 2 capsules by mouth daily. Omega 3 1280 mg    . vitamin E (VITAMIN E) 180 MG (400 UNITS) capsule Take 400 Units by mouth daily.     No current facility-administered medications for this visit.    REVIEW OF SYSTEMS:   A 10+ POINT REVIEW OF SYSTEMS WAS OBTAINED including neurology, dermatology, psychiatry, cardiac, respiratory, lymph, extremities, GI, GU, Musculoskeletal, constitutional, breasts, reproductive, HEENT.  All pertinent positives are noted in the HPI.  All others are negative.   PHYSICAL EXAMINATION:  ECOG PERFORMANCE STATUS: 0 - Asymptomatic  Vitals:   07/23/19 0835  BP: 117/80  Pulse: 63  Resp: 18  Temp: 98.2 F (36.8 C)    SpO2: 100%   Filed Weights   07/23/19 0835  Weight: 160 lb 8 oz (72.8 kg)   .Body mass index is 23.7 kg/m.   GENERAL:alert, in no acute distress and comfortable SKIN: no acute rashes, no significant lesions EYES: conjunctiva are pink and non-injected, sclera anicteric OROPHARYNX: MMM, no exudates, no oropharyngeal erythema or ulceration NECK: supple, no JVD LYMPH:  no  palpable lymphadenopathy in the cervical, axillary or inguinal regions LUNGS: clear to auscultation b/l with normal respiratory effort HEART: regular rate & rhythm ABDOMEN:  normoactive bowel sounds , non tender, not distended. No palpable hepatosplenomegaly.  Extremity: no pedal edema PSYCH: alert & oriented x 3 with fluent speech NEURO: no focal motor/sensory deficits  LABORATORY DATA:  I have reviewed the data as listed  . CBC Latest Ref Rng & Units 07/23/2019 05/21/2019 03/19/2019  WBC 4.0 - 10.5 K/uL 9.1 10.9(H) 13.9(H)  Hemoglobin 12.0 - 15.0 g/dL 13.3 12.9 13.1  Hematocrit 36.0 - 46.0 % 38.6 38.0 38.8  Platelets 150 - 400 K/uL 272 282 267   CBC    Component Value Date/Time   WBC 9.1 07/23/2019 0808   RBC 4.22 07/23/2019 0808   HGB 13.3 07/23/2019 0808   HGB 13.9 05/10/2018 0946   HGB 13.3 03/07/2017 0842   HCT 38.6 07/23/2019 0808   HCT 39.1 03/07/2017 0842   PLT 272 07/23/2019 0808   PLT 272 05/10/2018 0946   PLT 283 03/07/2017 0842   MCV 91.5 07/23/2019 0808   MCV 91.4 03/07/2017 0842   MCH 31.5 07/23/2019 0808   MCHC 34.5 07/23/2019 0808   RDW 12.4 07/23/2019 0808   RDW 13.3 03/07/2017 0842   LYMPHSABS 0.3 (L) 07/23/2019 0808   LYMPHSABS 0.4 (L) 03/07/2017 0842   MONOABS 0.6 07/23/2019 0808   MONOABS 0.7 03/07/2017 0842   EOSABS 0.0 07/23/2019 0808   EOSABS 0.0 03/07/2017 0842   BASOSABS 0.0 07/23/2019 0808   BASOSABS 0.0 03/07/2017 0842    . CMP Latest Ref Rng & Units 07/23/2019 05/21/2019 03/19/2019  Glucose 70 - 99 mg/dL 132(H) 136(H) 146(H)  BUN 8 - 23 mg/dL _0 Creatinine 0.44 - 1.00 mg/dL 0.76 0.75 0.72  Sodium 135 - 145 mmol/L 138 138 140  Potassium 3.5 - 5.1 mmol/L 4.0 4.1 4.0  Chloride 98 - 111 mmol/L 104 106 107  CO2 22 - 32 mmol/L 22 21(L) 21(L)  Calcium 8.9 - 10.3 mg/dL 9.8 9.2 9.7  Total Protein 6.5 - 8.1 g/dL 6.8 7.0 6.7  Total Bilirubin 0.3 - 1.2 mg/dL 0.5 0.5 0.4  Alkaline Phos 38 - 126 U/L 120 146(H) 182(H)  AST 15 - 41 U/L 15 18 32  ALT 0 - 44 U/L 19 24 47(H)   Component     Latest Ref Rng & Units 05/07/2016  Hepatitis B Surface Ag     Negative Negative  Hep B Core Ab, Tot     Negative Negative  Hep C Virus Ab     0.0 - 0.9 s/co ratio <0.1   . Lab Results  Component Value Date   LDH 199 (H) 07/23/2019         RADIOGRAPHIC STUDIES:   ASSESSMENT & PLAN:   74 y.o. 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  Initial PET/CT showed Scattered small mildly hypermetabolic lymph nodes within the neck, left hilum, left axilla and left groin, potentially related the patient's lymphoma. Focal hypermetabolic activity within the left sternal manubrium and left iliac bone, also potentially related to the patient's lymphoma. No evidence of solid visceral organ involvement.  CT guided bone marrow biopsy done -- no evidence of lymphoma involving Bone marrow. Normal LDH. No constitutional symptoms. Patient has no significant medical comorbidities at baseline. ECHO with nl EF as expected.  Patient has completed  6 cycles of R CHOP PET/CT s/p 6 cycles of R-CHOP shows complete metabolic response CT chest/abd/pelvis 07/07/2017 - showed no radipgraphic evidence of lymphoma progression at this time.  Lab Results  Component Value Date   LDH 199 (H) 07/23/2019   11/06/2018 CT C/A/P with results revealing "1. No evidence of recurrent lymphoma. 2.  Aortic atherosclerosis (ICD10-170.0)."   2) Rituxan hypersensitivity (mild  grade 1 hives on the back ) - resolved with solumedrol and antihistamines. No issues with dexamethasone pretreatment -Patient will continue dexamethasone premedication to reduce risk of Rituxan hypersensitivity.  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 LDH is stable at 199.  -The pt has no prohibitive toxicities from continuing her C17 of maintenance Rituxan, with Dexamethasone prior to infusion, at this time.  -Advised crowd avoidance and recommended infection prevention precautions. She is up to date on her vaccinations, including [place] to complete 18 cycles of maintenance Rituxan, then get repeat PET/CT -Plan to watch with labs and clinic visits after 18 cycles, depending on rpt PET/CT scan -Continue age appropriate cancer screening and mammograms with PCP -Will see back in 2 months with labs    FOLLOW UP: Please schedule last cycle of maintenance Rituxan in 2 months with port flush , labs and MD visit   The total time spent in the appt was 20 minutes and more than 50% was on counseling and direct patient cares.  All of the patient's questions were answered with apparent satisfaction. The patient knows to call the clinic with any problems, questions or concerns.   Sullivan Lone MD [place] AAHIVMS [facility] Hematology/Oncology Physician [facility]  (Office):       [phone] (Work cell):  [phone] (Fax):           [phone]  I, Yevette Edwards, am acting as a scribe for Dr. Sullivan Lone.   .I have reviewed the above documentation for accuracy and completeness, and I agree with the above. Brunetta Genera MD

## 2019-07-23 NOTE — Patient Instructions (Signed)
[place] [facility] Discharge Instructions for Patients Receiving Chemotherapy  Today you received the following chemotherapy agent: Rituxan  To help prevent nausea and vomiting after your treatment, we encourage you to take your nausea medication as directed by your MD.   If you develop nausea and vomiting that is not controlled by your nausea medication, call the clinic.   BELOW ARE SYMPTOMS THAT SHOULD BE REPORTED IMMEDIATELY:  *FEVER GREATER THAN 100.5 F  *CHILLS WITH OR WITHOUT FEVER  NAUSEA AND VOMITING THAT IS NOT CONTROLLED WITH YOUR NAUSEA MEDICATION  *UNUSUAL SHORTNESS OF BREATH  *UNUSUAL BRUISING OR BLEEDING  TENDERNESS IN MOUTH AND THROAT WITH OR WITHOUT PRESENCE OF ULCERS  *URINARY PROBLEMS  *BOWEL PROBLEMS  UNUSUAL RASH Items with * indicate a potential emergency and should be followed up as soon as possible.  Feel free to call the clinic should you have any questions or concerns. The clinic phone number is [phone].  Please show the [place] at check-in to the Emergency Department and triage nurse.  Coronavirus (COVID-19) Are you at risk?  Are you at risk for the Coronavirus (COVID-19)?  To be considered HIGH RISK for Coronavirus (COVID-19), you have to meet the following criteria:  . Traveled to [place], [place], [place], [place] or [place]; or in the [place] to [place], [place], [place], or [place]; and have fever, cough, and shortness of breath within the last 2 weeks of travel OR . Been in close contact with a person diagnosed with COVID-19 within the last 2 weeks and have fever, cough, and shortness of breath . IF YOU DO NOT MEET THESE CRITERIA, YOU ARE CONSIDERED LOW RISK FOR COVID-19.  What to do if you are HIGH RISK for COVID-19?  [name] If you are having a medical emergency, call 911. . Seek medical care right away. Before you go to a doctor's office, urgent care or emergency department, call ahead and tell them about  your recent travel, contact with someone diagnosed with COVID-19, and your symptoms. You should receive instructions from your physician's office regarding next steps of care.  . When you arrive at healthcare provider, tell the healthcare staff immediately you have returned from visiting [place], [place], [place], [place] or [place]; or traveled in the [place] to [place], [place], [place], or [place]; in the last two weeks or you have been in close contact with a person diagnosed with COVID-19 in the last 2 weeks.   . Tell the health care staff about your symptoms: fever, cough and shortness of breath. . After you have been seen by a medical provider, you will be either: o Tested for (COVID-19) and discharged home on quarantine except to seek medical care if symptoms worsen, and asked to  - Stay home and avoid contact with others until you get your results (4-5 days)  - Avoid travel on public transportation if possible (such as bus, train, or airplane) or o Sent to the Emergency Department by EMS for evaluation, COVID-19 testing, and possible admission depending on your condition and test results.  What to do if you are LOW RISK for COVID-19?  Reduce your risk of any infection by using the same precautions used for avoiding the common cold or flu:  [name] Wash your hands often with soap and warm water for at least 20 seconds.  If soap and water are not readily available, use an alcohol-based hand sanitizer with at least 60% alcohol.  . If  coughing or sneezing, cover your mouth and nose by coughing or sneezing into the elbow areas of your shirt or coat, into a tissue or into your sleeve (not your hands). . Avoid shaking hands with others and consider head nods or verbal greetings only. . Avoid touching your eyes, nose, or mouth with unwashed hands.  . Avoid close contact with people who are sick. . Avoid places or events with large numbers of people in one location, like concerts or sporting  events. . Carefully consider travel plans you have or are making. . If you are planning any travel outside or inside the [place], visit the CDC's Travelers' Health webpage for the latest health notices. . If you have some symptoms but not all symptoms, continue to monitor at home and seek medical attention if your symptoms worsen. . If you are having a medical emergency, call 911.   [place] / e-Visit: [email or web address]         [facility]: [facility]: [phone]                   [facility]: [phone]

## 2019-09-04 DIAGNOSIS — M25561 Pain in right knee: Secondary | ICD-10-CM | POA: Diagnosis not present

## 2019-09-05 DIAGNOSIS — M25561 Pain in right knee: Secondary | ICD-10-CM | POA: Diagnosis not present

## 2019-09-06 ENCOUNTER — Ambulatory Visit: Payer: Medicare Other | Admitting: Family Medicine

## 2019-09-06 ENCOUNTER — Encounter: Payer: Self-pay | Admitting: Hematology

## 2019-09-07 MED FILL — DEXAMETHASONE 4 MG TABLET: 4 | 2 days supply | Qty: 12 | Fill #2

## 2019-09-15 DIAGNOSIS — M25561 Pain in right knee: Secondary | ICD-10-CM | POA: Diagnosis not present

## 2019-09-18 NOTE — Progress Notes (Signed)
Pharmacist Chemotherapy Monitoring - Follow Up Assessment    I verify that I have reviewed each item in the below checklist:  . Regimen for the patient is scheduled for the appropriate day and plan matches scheduled date. [name] Appropriate non-routine labs are ordered dependent on drug ordered. . If applicable, additional medications reviewed and ordered per protocol based on lifetime cumulative doses and/or treatment regimen.   Plan for follow-up and/or issues identified: No . I-vent associated with next due treatment: No . MD and/or nursing notified: No  Philomena Course 09/18/2019 12:25 PM

## 2019-09-24 ENCOUNTER — Inpatient Hospital Stay: Payer: Medicare Other

## 2019-09-24 ENCOUNTER — Inpatient Hospital Stay (HOSPITAL_BASED_OUTPATIENT_CLINIC_OR_DEPARTMENT_OTHER): Payer: Medicare Other | Admitting: Hematology

## 2019-09-24 ENCOUNTER — Inpatient Hospital Stay: Payer: Medicare Other | Attending: Hematology

## 2019-09-24 ENCOUNTER — Other Ambulatory Visit: Payer: Self-pay

## 2019-09-24 VITALS — BP 106/49 | HR 54 | Temp 98.1°F | Resp 17

## 2019-09-24 VITALS — BP 134/72 | HR 66 | Temp 98.2°F | Resp 18 | Ht 69.0 in | Wt 162.7 lb

## 2019-09-24 DIAGNOSIS — R269 Unspecified abnormalities of gait and mobility: Secondary | ICD-10-CM | POA: Diagnosis not present

## 2019-09-24 DIAGNOSIS — T451X5A Adverse effect of antineoplastic and immunosuppressive drugs, initial encounter: Secondary | ICD-10-CM | POA: Diagnosis not present

## 2019-09-24 DIAGNOSIS — Z836 Family history of other diseases of the respiratory system: Secondary | ICD-10-CM | POA: Insufficient documentation

## 2019-09-24 DIAGNOSIS — Z823 Family history of stroke: Secondary | ICD-10-CM | POA: Diagnosis not present

## 2019-09-24 DIAGNOSIS — C8330 Diffuse large B-cell lymphoma, unspecified site: Secondary | ICD-10-CM

## 2019-09-24 DIAGNOSIS — G629 Polyneuropathy, unspecified: Secondary | ICD-10-CM | POA: Insufficient documentation

## 2019-09-24 DIAGNOSIS — M25561 Pain in right knee: Secondary | ICD-10-CM | POA: Diagnosis not present

## 2019-09-24 DIAGNOSIS — C8298 Follicular lymphoma, unspecified, lymph nodes of multiple sites: Secondary | ICD-10-CM

## 2019-09-24 DIAGNOSIS — Z8261 Family history of arthritis: Secondary | ICD-10-CM | POA: Insufficient documentation

## 2019-09-24 DIAGNOSIS — Z7289 Other problems related to lifestyle: Secondary | ICD-10-CM | POA: Diagnosis not present

## 2019-09-24 DIAGNOSIS — Z5112 Encounter for antineoplastic immunotherapy: Secondary | ICD-10-CM

## 2019-09-24 DIAGNOSIS — Z79899 Other long term (current) drug therapy: Secondary | ICD-10-CM | POA: Diagnosis not present

## 2019-09-24 DIAGNOSIS — E785 Hyperlipidemia, unspecified: Secondary | ICD-10-CM | POA: Diagnosis not present

## 2019-09-24 DIAGNOSIS — C8338 Diffuse large B-cell lymphoma, lymph nodes of multiple sites: Secondary | ICD-10-CM | POA: Insufficient documentation

## 2019-09-24 DIAGNOSIS — Z809 Family history of malignant neoplasm, unspecified: Secondary | ICD-10-CM | POA: Insufficient documentation

## 2019-09-24 DIAGNOSIS — Z8249 Family history of ischemic heart disease and other diseases of the circulatory system: Secondary | ICD-10-CM | POA: Insufficient documentation

## 2019-09-24 DIAGNOSIS — Z7189 Other specified counseling: Secondary | ICD-10-CM

## 2019-09-24 LAB — CMP (CANCER CENTER ONLY)
ALT: 20 U/L (ref 0–44)
AST: 16 U/L (ref 15–41)
Albumin: 4 g/dL (ref 3.5–5.0)
Alkaline Phosphatase: 108 U/L (ref 38–126)
Anion gap: 10 (ref 5–15)
BUN: 18 mg/dL (ref 8–23)
CO2: 22 mmol/L (ref 22–32)
Calcium: 9.6 mg/dL (ref 8.9–10.3)
Chloride: 106 mmol/L (ref 98–111)
Creatinine: 0.72 mg/dL (ref 0.44–1.00)
GFR, Est AFR Am: >60 mL/min (ref >60)
GFR, Estimated: >60 mL/min (ref >60)
Glucose, Bld: 135 mg/dL — ABNORMAL HIGH (ref 70–99)
Potassium: 4 mmol/L (ref 3.5–5.1)
Sodium: 138 mmol/L (ref 135–145)
Total Bilirubin: 0.5 mg/dL (ref 0.3–1.2)
Total Protein: 6.6 g/dL (ref 6.5–8.1)

## 2019-09-24 LAB — CBC WITH DIFFERENTIAL/PLATELET
Abs Immature Granulocytes: 0.03 10*3/uL (ref 0.00–0.07)
Basophils Absolute: 0 10*3/uL (ref 0.0–0.1)
Basophils Relative: 0 %
Eosinophils Absolute: 0 10*3/uL (ref 0.0–0.5)
Eosinophils Relative: 0 %
HCT: 37.5 % (ref 36.0–46.0)
Hemoglobin: 12.9 g/dL (ref 12.0–15.0)
Immature Granulocytes: 0 %
Lymphocytes Relative: 2 %
Lymphs Abs: 0.2 10*3/uL — ABNORMAL LOW (ref 0.7–4.0)
MCH: 31.1 pg (ref 26.0–34.0)
MCHC: 34.4 g/dL (ref 30.0–36.0)
MCV: 90.4 fL (ref 80.0–100.0)
Monocytes Absolute: 0.6 10*3/uL (ref 0.1–1.0)
Monocytes Relative: 7 %
Neutro Abs: 8.1 10*3/uL — ABNORMAL HIGH (ref 1.7–7.7)
Neutrophils Relative %: 91 %
Platelets: 237 10*3/uL (ref 150–400)
RBC: 4.15 MIL/uL (ref 3.87–5.11)
RDW: 12.4 % (ref 11.5–15.5)
WBC: 9 10*3/uL (ref 4.0–10.5)
nRBC: 0 % (ref 0.0–0.2)

## 2019-09-24 LAB — LACTATE DEHYDROGENASE: LDH: 182 U/L (ref 98–192)

## 2019-09-24 MED ORDER — DIPHENHYDRAMINE HCL 25 MG PO CAPS
ORAL_CAPSULE | ORAL | Status: AC
  Filled 2019-09-24: qty 2

## 2019-09-24 MED ORDER — SODIUM CHLORIDE 0.9 % IV SOLN
Freq: Once | INTRAVENOUS | Status: AC
  Filled 2019-09-24: qty 250

## 2019-09-24 MED ORDER — SODIUM CHLORIDE 0.9 % IV SOLN
12.0000 mg | Freq: Once | INTRAVENOUS | Status: AC
  Administered 2019-09-24: 12 mg via INTRAVENOUS
  Filled 2019-09-24: qty 1.2

## 2019-09-24 MED ORDER — SODIUM CHLORIDE 0.9 % IV SOLN
375.0000 mg/m2 | Freq: Once | INTRAVENOUS | Status: AC
  Administered 2019-09-24: 700 mg via INTRAVENOUS
  Filled 2019-09-24: qty 20

## 2019-09-24 MED ORDER — ACETAMINOPHEN 325 MG PO TABS
650.0000 mg | ORAL_TABLET | Freq: Once | ORAL | Status: AC
  Administered 2019-09-24: 650 mg via ORAL

## 2019-09-24 MED ORDER — DIPHENHYDRAMINE HCL 25 MG PO CAPS
50.0000 mg | ORAL_CAPSULE | Freq: Once | ORAL | Status: AC
  Administered 2019-09-24: 50 mg via ORAL

## 2019-09-24 MED ORDER — ACETAMINOPHEN 325 MG PO TABS
ORAL_TABLET | ORAL | Status: AC
  Filled 2019-09-24: qty 2

## 2019-09-24 NOTE — Progress Notes (Signed)
Lindsay Harrison     HEMATOLOGY/ONCOLOGY CLINIC NOTE  Date of Service:  09/24/19     Patient Care Team: [name], MD as PCP - General (Family Medicine) [name] M.D. (ENT)  CHIEF COMPLAINTS/PURPOSE OF CONSULTATION:   F/u for follicular lymphoma/DLBCL  HISTORY OF PRESENTING ILLNESS:   Please see previous note for details of initial presentation.  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 She has completed 6 cycles of R-CHOP and is currently in complete remission. She presents today for her C18 of maintenance Rituxan. The patient's last visit with [place] was on 07/23/2019. The pt reports that she is doing well overall.  The pt reports that she has been feeling well and denies any new symptoms. She is still planning on taking an international trip in September. Pt stepped wrong while walking and hurt her right knee. It is not painful to the touch, but is uncomfortable when she puts weight on it. It is not currently preventing her from doing her regular activities. She plans to complete PT to rehab her knee.  Lab results today (09/24/19) of CBC w/diff and CMP is as follows: all values are WNL except for Neutro Abs at 8.1K, Lymphs Abs at 0.2K, Glucose at 135. 09/24/2019 LDH at 182  On review of systems, pt reports right knee pain and denies fevers, chills, night sweats, back pain, abdominal pain and any other symptoms.   MEDICAL HISTORY:  Past Medical History:  Diagnosis Date  . Anemia    h/o iron deficiency secondary to heavy menses  . Arthritis    left hand index finger  . 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 Grief reaction 01/27/2016  . H/O measles    as a child  . History of chicken pox    as a child  . History of PCOS   . Hyperlipidemia, mixed 01/27/2016  . Lymphadenopathy   . 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 Preventative health care 01/27/2016  . Vitamin D deficiency   . Wears glasses      SURGICAL HISTORY: Past Surgical History:  Procedure Laterality Date  . COLONOSCOPY    . IR GENERIC HISTORICAL  05/21/2016   IR FLUORO GUIDE PORT INSERTION RIGHT 05/21/2016 Arne Cleveland, MD [facility]  . IR GENERIC HISTORICAL  05/21/2016   IR US GUIDE VASC ACCESS RIGHT 05/21/2016 Arne Cleveland, MD [facility]  . IR REMOVAL TUN ACCESS W/ PORT W/O FL MOD SED  10/27/2016  . SKIN BIOPSY Left    face  . THYROGLOSSAL DUCT CYST N/A 04/16/2016   Procedure: THYROGLOSSAL DUCT CYST excision;  Surgeon: [name], MD;  Location: [facility];  Service: ENT;  Laterality: N/A;    SOCIAL HISTORY: Social History   Socioeconomic History  . Marital status: Widowed    Spouse name: Not on file  . Number of children: Not on file  . Years of education: Not on file  . Highest education level: Not on file  Occupational History  . Occupation: Psychotherapist  Tobacco Use  . Smoking status: Never Smoker  . Smokeless tobacco: Never Used  Substance and Sexual Activity  . Alcohol use: Yes    Alcohol/week: 3.0 - 6.0 standard drinks    Types: 3 - 6 Standard drinks or equivalent per week    Comment: 5 glasses of wine a week.  . Drug use: No  . Sexual activity: Never    Partners: Male  Birth control/protection: None  Other Topics Concern  . Not on file  Social History Narrative   Married. Education: [employer]. Exercise: walking, yoga, and bicycling daily for 1-2 hours. Lives alone, works as Estate manager/land agent Strain:   . Difficulty of Paying Living Expenses:   Food Insecurity:   . Worried About Charity fundraiser in the Last Year:   . Arboriculturist in the Last Year:   Transportation Needs:   . Film/video editor (Medical):   Lindsay Harrison Lack of Transportation (Non-Medical):   Physical Activity:   . Days of Exercise per Week:   . Minutes of Exercise per Session:   Stress:   . Feeling of Stress :   Social Connections:   . Frequency of  Communication with Friends and Family:   . Frequency of Social Gatherings with Friends and Family:   . Attends Religious Services:   . Active Member of Clubs or Organizations:   . Attends Archivist Meetings:   Lindsay Harrison Marital Status:   Intimate Partner Violence:   . Fear of Current or Ex-Partner:   . Emotionally Abused:   Lindsay Harrison Physically Abused:   . Sexually Abused:     FAMILY HISTORY: Family History  Problem Relation Age of Onset  . Arthritis Mother        rheumatoid  . Heart disease Father        CHF  . COPD Sister        Emphysema, h/o cigarettes  . Other Sister        pituatary tumor  . Arthritis Sister   . Obesity Sister   . Cancer Paternal Grandfather        cancer  . Stroke Maternal Aunt     ALLERGIES:  is allergic to erythromycin and other.  MEDICATIONS:  Current Outpatient Medications  Medication Sig Dispense Refill  . Calcium-Magnesium-Vitamin D (CALCIUM MAGNESIUM PO) Take by mouth.    . Cholecalciferol (VITAMIN D) 2000 units CAPS Take 2,000 Units by mouth daily.    Lindsay Harrison dexamethasone (DECADRON) 4 MG tablet TAKE 3 TABLETS BY MOUTH TWICE DAILY, TAKE 1 DOSE IN THE MORNING AND 1 DOSE IN THE EVENING THE DAY PRIOR TO Rituxan 12 tablet 6  . Multiple Vitamins-Minerals (ZINC PO) Take by mouth.    . Omega-3 Fatty Acids (OMEGA-3 PO) Take 2 capsules by mouth daily. Omega 3 1280 mg    . vitamin E (VITAMIN E) 180 MG (400 UNITS) capsule Take 400 Units by mouth daily.    Lindsay Harrison ELDERBERRY PO Take by mouth.     No current facility-administered medications for this visit.    REVIEW OF SYSTEMS:   A 10+ POINT REVIEW OF SYSTEMS WAS OBTAINED including neurology, dermatology, psychiatry, cardiac, respiratory, lymph, extremities, GI, GU, Musculoskeletal, constitutional, breasts, reproductive, HEENT.  All pertinent positives are noted in the HPI.  All others are negative.   PHYSICAL EXAMINATION:  ECOG PERFORMANCE STATUS: 0 - Asymptomatic  Vitals:   09/24/19 0859  BP: 134/72    Pulse: 66  Resp: 18  Temp: 98.2 F (36.8 C)  SpO2: 99%   Filed Weights   09/24/19 0859  Weight: 162 lb 11.2 oz (73.8 kg)   .Body mass index is 24.03 kg/m.   GENERAL:alert, in no acute distress and comfortable SKIN: no acute rashes, no significant lesions EYES: conjunctiva are pink and non-injected, sclera anicteric OROPHARYNX: MMM, no exudates, no oropharyngeal erythema or ulceration NECK: supple, no  JVD LYMPH:  no palpable lymphadenopathy in the cervical, axillary or inguinal regions LUNGS: clear to auscultation b/l with normal respiratory effort HEART: regular rate & rhythm ABDOMEN:  normoactive bowel sounds , non tender, not distended. No palpable hepatosplenomegaly.  Extremity: no pedal edema PSYCH: alert & oriented x 3 with fluent speech NEURO: no focal motor/sensory deficits  LABORATORY DATA:  I have reviewed the data as listed  . CBC Latest Ref Rng & Units 09/24/2019 07/23/2019 05/21/2019  WBC 4.0 - 10.5 K/uL 9.0 9.1 10.9(H)  Hemoglobin 12.0 - 15.0 g/dL 12.9 13.3 12.9  Hematocrit 36.0 - 46.0 % 37.5 38.6 38.0  Platelets 150 - 400 K/uL 237 272 282   CBC    Component Value Date/Time   WBC 9.0 09/24/2019 0816   RBC 4.15 09/24/2019 0816   HGB 12.9 09/24/2019 0816   HGB 13.9 05/10/2018 0946   HGB 13.3 03/07/2017 0842   HCT 37.5 09/24/2019 0816   HCT 39.1 03/07/2017 0842   PLT 237 09/24/2019 0816   PLT 272 05/10/2018 0946   PLT 283 03/07/2017 0842   MCV 90.4 09/24/2019 0816   MCV 91.4 03/07/2017 0842   MCH 31.1 09/24/2019 0816   MCHC 34.4 09/24/2019 0816   RDW 12.4 09/24/2019 0816   RDW 13.3 03/07/2017 0842   LYMPHSABS 0.2 (L) 09/24/2019 0816   LYMPHSABS 0.4 (L) 03/07/2017 0842   MONOABS 0.6 09/24/2019 0816   MONOABS 0.7 03/07/2017 0842   EOSABS 0.0 09/24/2019 0816   EOSABS 0.0 03/07/2017 0842   BASOSABS 0.0 09/24/2019 0816   BASOSABS 0.0 03/07/2017 0842    . CMP Latest Ref Rng & Units 09/24/2019 07/23/2019 05/21/2019  Glucose 70 - 99 mg/dL 135(H) 132(H)  136(H)  BUN 8 - 23 mg/dL 18 22 17   Creatinine 0.44 - 1.00 mg/dL 0.72 0.76 0.75  Sodium 135 - 145 mmol/L 138 138 138  Potassium 3.5 - 5.1 mmol/L 4.0 4.0 4.1  Chloride 98 - 111 mmol/L 106 104 106  CO2 22 - 32 mmol/L 22 22 21(L)  Calcium 8.9 - 10.3 mg/dL 9.6 9.8 9.2  Total Protein 6.5 - 8.1 g/dL 6.6 6.8 7.0  Total Bilirubin 0.3 - 1.2 mg/dL 0.5 0.5 0.5  Alkaline Phos 38 - 126 U/L 108 120 146(H)  AST 15 - 41 U/L 16 15 18   ALT 0 - 44 U/L 20 19 24    Component     Latest Ref Rng & Units 05/07/2016  Hepatitis B Surface Ag     Negative Negative  Hep B Core Ab, Tot     Negative Negative  Hep C Virus Ab     0.0 - 0.9 s/co ratio <0.1   . Lab Results  Component Value Date   LDH 182 09/24/2019         RADIOGRAPHIC STUDIES:   ASSESSMENT & PLAN:   74 y.o. 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  Initial PET/CT showed Scattered small mildly hypermetabolic lymph nodes within the neck, left hilum, left axilla and left groin, potentially related the patient's lymphoma. Focal hypermetabolic activity within the left sternal manubrium and left iliac bone, also potentially related to the patient's lymphoma. No evidence of solid visceral organ involvement.  CT guided bone marrow biopsy done -- no evidence of lymphoma involving Bone marrow. Normal LDH. No constitutional symptoms. Patient has no significant medical comorbidities at baseline. ECHO with nl EF as expected.  Patient has completed 6 cycles of R CHOP PET/CT s/p 6 cycles of R-CHOP shows complete metabolic response CT chest/abd/pelvis 07/07/2017 - showed no radipgraphic evidence of lymphoma progression at this time.  Lab Results  Component Value Date   LDH 182 09/24/2019   11/06/2018 CT C/A/P with results revealing "1. No evidence of recurrent lymphoma. 2.  Aortic atherosclerosis (ICD10-170.0)."   2) Rituxan  hypersensitivity (mild grade 1 hives on the back ) - resolved with solumedrol and antihistamines. No issues with dexamethasone pretreatment -Patient will continue dexamethasone premedication to reduce risk of Rituxan hypersensitivity.  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  -Today is the last planned maintenance treatment - will watch with labs and clinic visits ongoing  -Advised pt that the aggressive component of her disease has remained in remission for two years and is unlikely to return soon, but we will continue monitoring for recurrence.  -Recommend OTC KT tape for knee discomfort -Discussed again proper safety precautions, especially in light of travel plans -Continue age appropriate cancer screening and mammograms with PCP -Will repeat CT C/A/P in 8 weeks for post maintenance baseline -Will see back in 9 weeks, with labs 1 week prior   FOLLOW UP: CT chest/abd/pelvis in 8 weeks Labs in 8 weeks RTC with Dr Irene Limbo in 9 weeks   The total time spent in the appt was 30 minutes and more than 50% was on counseling and direct patient cares.  All of the patient's questions were answered with apparent satisfaction. The patient knows to call the clinic with any problems, questions or concerns.   Sullivan Lone MD [place] AAHIVMS [facility] Hematology/Oncology Physician [facility]  (Office):       [phone] (Work cell):  [phone] (Fax):           [phone]  I, Yevette Edwards, am acting as a scribe for Dr. Sullivan Lone.   .I have reviewed the above documentation for accuracy and completeness, and I agree with the above. Brunetta Genera MD

## 2019-09-24 NOTE — Patient Instructions (Signed)
[facility] Discharge Instructions for Patients Receiving Chemotherapy  Today you received the following chemotherapy agent: Rituxan  To help prevent nausea and vomiting after your treatment, we encourage you to take your nausea medication as directed by your MD.   If you develop nausea and vomiting that is not controlled by your nausea medication, call the clinic.   BELOW ARE SYMPTOMS THAT SHOULD BE REPORTED IMMEDIATELY:  *FEVER GREATER THAN 100.5 F  *CHILLS WITH OR WITHOUT FEVER  NAUSEA AND VOMITING THAT IS NOT CONTROLLED WITH YOUR NAUSEA MEDICATION  *UNUSUAL SHORTNESS OF BREATH  *UNUSUAL BRUISING OR BLEEDING  TENDERNESS IN MOUTH AND THROAT WITH OR WITHOUT PRESENCE OF ULCERS  *URINARY PROBLEMS  *BOWEL PROBLEMS  UNUSUAL RASH Items with * indicate a potential emergency and should be followed up as soon as possible.  Feel free to call the clinic should you have any questions or concerns. The clinic phone number is [phone].  Please show the [place] at check-in to the Emergency Department and triage nurse.  Coronavirus (COVID-19) Are you at risk?  Are you at risk for the Coronavirus (COVID-19)?  To be considered HIGH RISK for Coronavirus (COVID-19), you have to meet the following criteria:  . Traveled to [place], [place], [place], [place] or [place]; or in the [place] to [place], [place], [place], or [place]; and have fever, cough, and shortness of breath within the last 2 weeks of travel OR . Been in close contact with a person diagnosed with COVID-19 within the last 2 weeks and have fever, cough, and shortness of breath . IF YOU DO NOT MEET THESE CRITERIA, YOU ARE CONSIDERED LOW RISK FOR COVID-19.  What to do if you are HIGH RISK for COVID-19?  [name] If you are having a medical emergency, call 911. . Seek medical care right away. Before you go to a doctor's office, urgent care or emergency department, call ahead and tell them about  your recent travel, contact with someone diagnosed with COVID-19, and your symptoms. You should receive instructions from your physician's office regarding next steps of care.  . When you arrive at healthcare provider, tell the healthcare staff immediately you have returned from visiting [place], [place], [place], [place] or [place]; or traveled in the [place] to [place], [place], [place], or [place]; in the last two weeks or you have been in close contact with a person diagnosed with COVID-19 in the last 2 weeks.   . Tell the health care staff about your symptoms: fever, cough and shortness of breath. . After you have been seen by a medical provider, you will be either: o Tested for (COVID-19) and discharged home on quarantine except to seek medical care if symptoms worsen, and asked to  - Stay home and avoid contact with others until you get your results (4-5 days)  - Avoid travel on public transportation if possible (such as bus, train, or airplane) or o Sent to the Emergency Department by EMS for evaluation, COVID-19 testing, and possible admission depending on your condition and test results.  What to do if you are LOW RISK for COVID-19?  Reduce your risk of any infection by using the same precautions used for avoiding the common cold or flu:  [name] Wash your hands often with soap and warm water for at least 20 seconds.  If soap and water are not readily available, use an alcohol-based hand sanitizer with at least 60% alcohol.  . If  coughing or sneezing, cover your mouth and nose by coughing or sneezing into the elbow areas of your shirt or coat, into a tissue or into your sleeve (not your hands). . Avoid shaking hands with others and consider head nods or verbal greetings only. . Avoid touching your eyes, nose, or mouth with unwashed hands.  . Avoid close contact with people who are sick. . Avoid places or events with large numbers of people in one location, like concerts or sporting  events. . Carefully consider travel plans you have or are making. . If you are planning any travel outside or inside the [place], visit the CDC's Travelers' Health webpage for the latest health notices. . If you have some symptoms but not all symptoms, continue to monitor at home and seek medical attention if your symptoms worsen. . If you are having a medical emergency, call 911.   [place] / e-Visit: [email or web address]         [facility]: [facility]: [phone]                   [facility]: [phone]

## 2019-09-25 ENCOUNTER — Ambulatory Visit: Payer: Medicare Other | Admitting: Sports Medicine

## 2019-09-25 ENCOUNTER — Telehealth: Payer: Self-pay | Admitting: Hematology

## 2019-09-25 NOTE — Telephone Encounter (Signed)
Scheduled per 05/24 los, patient has been called and voicemail was left. 

## 2019-09-26 DIAGNOSIS — M25561 Pain in right knee: Secondary | ICD-10-CM | POA: Diagnosis not present

## 2019-09-26 DIAGNOSIS — R269 Unspecified abnormalities of gait and mobility: Secondary | ICD-10-CM | POA: Diagnosis not present

## 2019-09-27 ENCOUNTER — Encounter: Payer: Self-pay | Admitting: Hematology

## 2019-10-02 DIAGNOSIS — M25561 Pain in right knee: Secondary | ICD-10-CM | POA: Diagnosis not present

## 2019-10-02 DIAGNOSIS — R269 Unspecified abnormalities of gait and mobility: Secondary | ICD-10-CM | POA: Diagnosis not present

## 2019-10-05 DIAGNOSIS — M25561 Pain in right knee: Secondary | ICD-10-CM | POA: Diagnosis not present

## 2019-10-05 DIAGNOSIS — R269 Unspecified abnormalities of gait and mobility: Secondary | ICD-10-CM | POA: Diagnosis not present

## 2019-10-08 DIAGNOSIS — M25561 Pain in right knee: Secondary | ICD-10-CM | POA: Diagnosis not present

## 2019-10-08 DIAGNOSIS — R269 Unspecified abnormalities of gait and mobility: Secondary | ICD-10-CM | POA: Diagnosis not present

## 2019-10-11 DIAGNOSIS — R269 Unspecified abnormalities of gait and mobility: Secondary | ICD-10-CM | POA: Diagnosis not present

## 2019-10-11 DIAGNOSIS — M25561 Pain in right knee: Secondary | ICD-10-CM | POA: Diagnosis not present

## 2019-10-16 DIAGNOSIS — M25561 Pain in right knee: Secondary | ICD-10-CM | POA: Diagnosis not present

## 2019-10-16 DIAGNOSIS — R269 Unspecified abnormalities of gait and mobility: Secondary | ICD-10-CM | POA: Diagnosis not present

## 2019-10-18 ENCOUNTER — Ambulatory Visit: Payer: Medicare Other | Admitting: Family Medicine

## 2019-11-14 DIAGNOSIS — M25561 Pain in right knee: Secondary | ICD-10-CM | POA: Diagnosis not present

## 2019-11-14 DIAGNOSIS — R269 Unspecified abnormalities of gait and mobility: Secondary | ICD-10-CM | POA: Diagnosis not present

## 2019-11-16 DIAGNOSIS — M25561 Pain in right knee: Secondary | ICD-10-CM | POA: Diagnosis not present

## 2019-11-16 DIAGNOSIS — R269 Unspecified abnormalities of gait and mobility: Secondary | ICD-10-CM | POA: Diagnosis not present

## 2019-11-19 ENCOUNTER — Inpatient Hospital Stay: Payer: Medicare Other | Attending: Hematology

## 2019-11-19 ENCOUNTER — Other Ambulatory Visit: Payer: Self-pay

## 2019-11-19 ENCOUNTER — Ambulatory Visit (HOSPITAL_COMMUNITY)
Admission: RE | Admit: 2019-11-19 | Discharge: 2019-11-19 | Disposition: A | Discharge status: Home / Self Care | Payer: Medicare Other | Source: Ambulatory Visit | Attending: Hematology | Admitting: Hematology

## 2019-11-19 DIAGNOSIS — Z5112 Encounter for antineoplastic immunotherapy: Secondary | ICD-10-CM | POA: Diagnosis not present

## 2019-11-19 DIAGNOSIS — C8298 Follicular lymphoma, unspecified, lymph nodes of multiple sites: Secondary | ICD-10-CM | POA: Insufficient documentation

## 2019-11-19 DIAGNOSIS — J984 Other disorders of lung: Secondary | ICD-10-CM | POA: Diagnosis not present

## 2019-11-19 DIAGNOSIS — K7689 Other specified diseases of liver: Secondary | ICD-10-CM | POA: Diagnosis not present

## 2019-11-19 DIAGNOSIS — I700 Atherosclerosis of aorta: Secondary | ICD-10-CM | POA: Diagnosis not present

## 2019-11-19 LAB — CMP (CANCER CENTER ONLY)
ALT: 15 U/L (ref 0–44)
AST: 14 U/L — ABNORMAL LOW (ref 15–41)
Albumin: 4.1 g/dL (ref 3.5–5.0)
Alkaline Phosphatase: 133 U/L — ABNORMAL HIGH (ref 38–126)
Anion gap: 11 (ref 5–15)
BUN: 12 mg/dL (ref 8–23)
CO2: 25 mmol/L (ref 22–32)
Calcium: 9.6 mg/dL (ref 8.9–10.3)
Chloride: 105 mmol/L (ref 98–111)
Creatinine: 0.73 mg/dL (ref 0.44–1.00)
GFR, Est AFR Am: >60 mL/min (ref >60)
GFR, Estimated: >60 mL/min (ref >60)
Glucose, Bld: 83 mg/dL (ref 70–99)
Potassium: 4.2 mmol/L (ref 3.5–5.1)
Sodium: 141 mmol/L (ref 135–145)
Total Bilirubin: 0.6 mg/dL (ref 0.3–1.2)
Total Protein: 6.9 g/dL (ref 6.5–8.1)

## 2019-11-19 LAB — CBC WITH DIFFERENTIAL/PLATELET
Abs Immature Granulocytes: 0.01 10*3/uL (ref 0.00–0.07)
Basophils Absolute: 0.1 10*3/uL (ref 0.0–0.1)
Basophils Relative: 1 %
Eosinophils Absolute: 0.1 10*3/uL (ref 0.0–0.5)
Eosinophils Relative: 3 %
HCT: 42.9 % (ref 36.0–46.0)
Hemoglobin: 14.2 g/dL (ref 12.0–15.0)
Immature Granulocytes: 0 %
Lymphocytes Relative: 17 %
Lymphs Abs: 0.9 10*3/uL (ref 0.7–4.0)
MCH: 31 pg (ref 26.0–34.0)
MCHC: 33.1 g/dL (ref 30.0–36.0)
MCV: 93.7 fL (ref 80.0–100.0)
Monocytes Absolute: 0.6 10*3/uL (ref 0.1–1.0)
Monocytes Relative: 11 %
Neutro Abs: 3.6 10*3/uL (ref 1.7–7.7)
Neutrophils Relative %: 68 %
Platelets: 272 10*3/uL (ref 150–400)
RBC: 4.58 MIL/uL (ref 3.87–5.11)
RDW: 12.8 % (ref 11.5–15.5)
WBC: 5.3 10*3/uL (ref 4.0–10.5)
nRBC: 0 % (ref 0.0–0.2)

## 2019-11-19 LAB — LACTATE DEHYDROGENASE: LDH: 233 U/L — ABNORMAL HIGH (ref 98–192)

## 2019-11-19 MED ORDER — SODIUM CHLORIDE (PF) 0.9 % IJ SOLN
INTRAMUSCULAR | Status: AC
  Filled 2019-11-19: qty 50

## 2019-11-19 MED ORDER — IOHEXOL 300 MG/ML  SOLN
100.0000 mL | Freq: Once | INTRAMUSCULAR | Status: AC | PRN
  Administered 2019-11-19: 100 mL via INTRAVENOUS

## 2019-11-20 DIAGNOSIS — R269 Unspecified abnormalities of gait and mobility: Secondary | ICD-10-CM | POA: Diagnosis not present

## 2019-11-20 DIAGNOSIS — M25561 Pain in right knee: Secondary | ICD-10-CM | POA: Diagnosis not present

## 2019-11-21 NOTE — Progress Notes (Signed)
[name]     HEMATOLOGY/ONCOLOGY CLINIC NOTE  Date of Service:  11/26/19     Patient Care Team: [name], MD as PCP - General (Family Medicine) [name] M.D. (ENT)  CHIEF COMPLAINTS/PURPOSE OF CONSULTATION:   F/u for follicular lymphoma/DLBCL  HISTORY OF PRESENTING ILLNESS:   Please see previous note for details of initial presentation.  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 She has completed 6 cycles of R-CHOP and 18 cycles of maintenance Rituxan. The patient's last visit with [place] was on 09/24/2019. The pt reports that she is doing well overall.  The pt reports she has seen great improvement in her knee with PT. She is interested in receiving her Shingles vaccine booster.   Of note since the patient's last visit, pt has had CT C/A/P ([id number]) ([id number]) completed on 11/19/2019 with results revealing "No evidence of disease recurrence."  Lab results (11/19/19) of CBC w/diff and CMP is as follows: all values are WNL except for AST at 14, ALP at 133. 11/19/2019 LDH at 233  On review of systems, pt denies abdominal pain, leg swelling, fevers, chills, night sweats and any other symptoms.   MEDICAL HISTORY:  Past Medical History:  Diagnosis Date  . Anemia    h/o iron deficiency secondary to heavy menses  . Arthritis    left hand index finger  . 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 Grief reaction 01/27/2016  . H/O measles    as a child  . History of chicken pox    as a child  . History of PCOS   . Hyperlipidemia, mixed 01/27/2016  . Lymphadenopathy   . 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 Preventative health care 01/27/2016  . Vitamin D deficiency   . Wears glasses     SURGICAL HISTORY: Past Surgical History:  Procedure Laterality Date  . COLONOSCOPY    . IR GENERIC HISTORICAL  05/21/2016   IR FLUORO GUIDE PORT INSERTION RIGHT 05/21/2016 Arne Cleveland, MD [facility]  .  IR GENERIC HISTORICAL  05/21/2016   IR US GUIDE VASC ACCESS RIGHT 05/21/2016 Arne Cleveland, MD [facility]  . IR REMOVAL TUN ACCESS W/ PORT W/O FL MOD SED  10/27/2016  . SKIN BIOPSY Left    face  . THYROGLOSSAL DUCT CYST N/A 04/16/2016   Procedure: THYROGLOSSAL DUCT CYST excision;  Surgeon: [name], MD;  Location: [place];  Service: ENT;  Laterality: N/A;    SOCIAL HISTORY: Social History   Socioeconomic History  . Marital status: Widowed    Spouse name: Not on file  . Number of children: Not on file  . Years of education: Not on file  . Highest education level: Not on file  Occupational History  . Occupation: Psychotherapist  Tobacco Use  . Smoking status: Never Smoker  . Smokeless tobacco: Never Used  Vaping Use  . Vaping Use: Never used  Substance and Sexual Activity  . Alcohol use: Yes    Alcohol/week: 3.0 - 6.0 standard drinks    Types: 3 - 6 Standard drinks or equivalent per week    Comment: 5 glasses of wine a week.  . Drug use: No  . Sexual activity: Never    Partners: Male    Birth control/protection: None  Other Topics Concern  . Not on file  Social History Narrative   Married. Education: [employer]. Exercise: walking, yoga, and bicycling daily for 1-2 hours. Lives alone, works  as psychotherapist   Social Determinants of Health   Financial Resource Strain:   . Difficulty of Paying Living Expenses:   Food Insecurity:   . Worried About Charity fundraiser in the Last Year:   . Arboriculturist in the Last Year:   Transportation Needs:   . Film/video editor (Medical):   [name] Lack of Transportation (Non-Medical):   Physical Activity:   . Days of Exercise per Week:   . Minutes of Exercise per Session:   Stress:   . Feeling of Stress :   Social Connections:   . Frequency of Communication with Friends and Family:   . Frequency of Social Gatherings with Friends and Family:   . Attends Religious Services:   . Active Member of Clubs or Organizations:   .  Attends Archivist Meetings:   [name] Marital Status:   Intimate Partner Violence:   . Fear of Current or Ex-Partner:   . Emotionally Abused:   [name] Physically Abused:   . Sexually Abused:     FAMILY HISTORY: Family History  Problem Relation Age of Onset  . Arthritis Mother        rheumatoid  . Heart disease Father        CHF  . COPD Sister        Emphysema, h/o cigarettes  . Other Sister        pituatary tumor  . Arthritis Sister   . Obesity Sister   . Cancer Paternal Grandfather        cancer  . Stroke Maternal Aunt     ALLERGIES:  is allergic to erythromycin and other.  MEDICATIONS:  Current Outpatient Medications  Medication Sig Dispense Refill  . amoxicillin (AMOXIL) 500 MG capsule Take 1 capsule (500 mg total) by mouth 3 (three) times daily. 30 capsule 0  . Calcium-Magnesium-Vitamin D (CALCIUM MAGNESIUM PO) Take by mouth.    . Cholecalciferol (VITAMIN D) 2000 units CAPS Take 2,000 Units by mouth daily.    [name] dexamethasone (DECADRON) 4 MG tablet TAKE 3 TABLETS BY MOUTH TWICE DAILY, TAKE 1 DOSE IN THE MORNING AND 1 DOSE IN THE EVENING THE DAY PRIOR TO Rituxan 12 tablet 6  . diphenoxylate-atropine (LOMOTIL) 2.5-0.025 MG tablet Take 1 tablet by mouth 4 (four) times daily as needed for diarrhea or loose stools. 30 tablet 0  . ELDERBERRY PO Take by mouth.    . Multiple Vitamins-Minerals (ZINC PO) Take by mouth.    . Omega-3 Fatty Acids (OMEGA-3 PO) Take 2 capsules by mouth daily. Omega 3 1280 mg    . vitamin E (VITAMIN E) 180 MG (400 UNITS) capsule Take 400 Units by mouth daily.     No current facility-administered medications for this visit.    REVIEW OF SYSTEMS:   A 10+ POINT REVIEW OF SYSTEMS WAS OBTAINED including neurology, dermatology, psychiatry, cardiac, respiratory, lymph, extremities, GI, GU, Musculoskeletal, constitutional, breasts, reproductive, HEENT.  All pertinent positives are noted in the HPI.  All others are negative.   PHYSICAL EXAMINATION:  ECOG  PERFORMANCE STATUS: 0 - Asymptomatic  Vitals:   11/26/19 0930  BP: (!) 139/77  Pulse: 83  Resp: 18  Temp: (!) 97.3 F (36.3 C)  SpO2: 98%   Filed Weights   11/26/19 0930  Weight: 166 lb 14.4 oz (75.7 kg)   .Body mass index is 24.65 kg/m.   GENERAL:alert, in no acute distress and comfortable SKIN: no acute rashes, no significant lesions EYES: conjunctiva are pink  and non-injected, sclera anicteric OROPHARYNX: MMM, no exudates, no oropharyngeal erythema or ulceration NECK: supple, no JVD LYMPH:  no palpable lymphadenopathy in the cervical, axillary or inguinal regions LUNGS: clear to auscultation b/l with normal respiratory effort HEART: regular rate & rhythm ABDOMEN:  normoactive bowel sounds , non tender, not distended. No palpable hepatosplenomegaly.  Extremity: no pedal edema PSYCH: alert & oriented x 3 with fluent speech NEURO: no focal motor/sensory deficits  LABORATORY DATA:  I have reviewed the data as listed  . CBC Latest Ref Rng & Units 11/19/2019 09/24/2019 07/23/2019  WBC 4.0 - 10.5 K/uL 5.3 9.0 9.1  Hemoglobin 12.0 - 15.0 g/dL 14.2 12.9 13.3  Hematocrit 36 - 46 % 42.9 37.5 38.6  Platelets 150 - 400 K/uL 272 237 272   CBC    Component Value Date/Time   WBC 5.3 11/19/2019 1002   RBC 4.58 11/19/2019 1002   HGB 14.2 11/19/2019 1002   HGB 13.9 05/10/2018 0946   HGB 13.3 03/07/2017 0842   HCT 42.9 11/19/2019 1002   HCT 39.1 03/07/2017 0842   PLT 272 11/19/2019 1002   PLT 272 05/10/2018 0946   PLT 283 03/07/2017 0842   MCV 93.7 11/19/2019 1002   MCV 91.4 03/07/2017 0842   MCH 31.0 11/19/2019 1002   MCHC 33.1 11/19/2019 1002   RDW 12.8 11/19/2019 1002   RDW 13.3 03/07/2017 0842   LYMPHSABS 0.9 11/19/2019 1002   LYMPHSABS 0.4 (L) 03/07/2017 0842   MONOABS 0.6 11/19/2019 1002   MONOABS 0.7 03/07/2017 0842   EOSABS 0.1 11/19/2019 1002   EOSABS 0.0 03/07/2017 0842   BASOSABS 0.1 11/19/2019 1002   BASOSABS 0.0 03/07/2017 0842    . CMP Latest Ref Rng &  Units 11/19/2019 09/24/2019 07/23/2019  Glucose 70 - 99 mg/dL 83 135(H) 132(H)  BUN 8 - 23 mg/dL _0 Creatinine 0.44 - 1.00 mg/dL 0.73 0.72 0.76  Sodium 135 - 145 mmol/L 141 138 138  Potassium 3.5 - 5.1 mmol/L 4.2 4.0 4.0  Chloride 98 - 111 mmol/L 105 106 104  CO2 22 - 32 mmol/L _1 Calcium 8.9 - 10.3 mg/dL 9.6 9.6 9.8  Total Protein 6.5 - 8.1 g/dL 6.9 6.6 6.8  Total Bilirubin 0.3 - 1.2 mg/dL 0.6 0.5 0.5  Alkaline Phos 38 - 126 U/L 133(H) 108 120  AST 15 - 41 U/L 14(L) 16 15  ALT 0 - 44 U/L _2 Component     Latest Ref Rng & Units 05/07/2016  Hepatitis B Surface Ag     Negative Negative  Hep B Core Ab, Tot     Negative Negative  Hep C Virus Ab     0.0 - 0.9 s/co ratio <0.1   . Lab Results  Component Value Date   LDH 233 (H) 11/19/2019         RADIOGRAPHIC STUDIES:   ASSESSMENT & PLAN:   74 y.o. 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  Initial PET/CT showed Scattered small mildly hypermetabolic lymph nodes within the neck, left hilum, left axilla and left groin, potentially related the patient's lymphoma. Focal hypermetabolic activity within the left sternal manubrium and left iliac bone, also potentially related to the patient's lymphoma. No evidence of solid visceral organ involvement.  CT guided bone marrow biopsy done -- no evidence of lymphoma involving Bone marrow. Normal LDH. No constitutional  symptoms. Patient has no significant medical comorbidities at baseline. ECHO with nl EF as expected.  Patient has completed 6 cycles of R CHOP PET/CT s/p 6 cycles of R-CHOP shows complete metabolic response CT chest/abd/pelvis 07/07/2017 - showed no radipgraphic evidence of lymphoma progression at this time.  Lab Results  Component Value Date   LDH 233 (H) 11/19/2019   11/06/2018 CT C/A/P with results revealing "1. No evidence of recurrent  lymphoma. 2.  Aortic atherosclerosis (ICD10-170.0)."   2) Rituxan hypersensitivity (mild grade 1 hives on the back ) - resolved with solumedrol and antihistamines. No issues with dexamethasone pretreatment -Patient will continue dexamethasone premedication to reduce risk of Rituxan hypersensitivity.  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  -No lab, clinical, or radiographic evidence of Follicular Lymphoma recurrence at this time.  -Due to stability of disease will continue follow-up every 4 months, may move out to every 6 months.  -Recommend pt wait 4-6 months to receive the Shingrix vaccine. -Discussed safety precautions while traveling in light of the ongoing pandemic.  -Will see back in 4 months with labs    FOLLOW UP: RTC with Dr Irene Limbo with labs in 4 months   The total time spent in the appt was 20 minutes and more than 50% was on counseling and direct patient cares.  All of the patient's questions were answered with apparent satisfaction. The patient knows to call the clinic with any problems, questions or concerns.   Sullivan Lone MD [place] AAHIVMS [facility] Hematology/Oncology Physician [facility]  (Office):       [phone] (Work cell):  [phone] (Fax):           [phone]  I, Yevette Edwards, am acting as a scribe for Dr. Sullivan Lone.   .I have reviewed the above documentation for accuracy and completeness, and I agree with the above. Brunetta Genera MD

## 2019-11-22 ENCOUNTER — Encounter: Payer: Self-pay | Admitting: Family Medicine

## 2019-11-22 ENCOUNTER — Other Ambulatory Visit: Payer: Self-pay

## 2019-11-22 ENCOUNTER — Ambulatory Visit (INDEPENDENT_AMBULATORY_CARE_PROVIDER_SITE_OTHER): Payer: Medicare Other | Admitting: Family Medicine

## 2019-11-22 VITALS — BP 132/82 | HR 79 | Temp 98.9°F | Resp 12 | Ht 69.0 in | Wt 164.6 lb

## 2019-11-22 DIAGNOSIS — C8330 Diffuse large B-cell lymphoma, unspecified site: Secondary | ICD-10-CM | POA: Diagnosis not present

## 2019-11-22 DIAGNOSIS — S83241A Other tear of medial meniscus, current injury, right knee, initial encounter: Secondary | ICD-10-CM | POA: Insufficient documentation

## 2019-11-22 DIAGNOSIS — R739 Hyperglycemia, unspecified: Secondary | ICD-10-CM

## 2019-11-22 DIAGNOSIS — E559 Vitamin D deficiency, unspecified: Secondary | ICD-10-CM

## 2019-11-22 DIAGNOSIS — R7402 Elevation of levels of lactic acid dehydrogenase (LDH): Secondary | ICD-10-CM

## 2019-11-22 DIAGNOSIS — S83241D Other tear of medial meniscus, current injury, right knee, subsequent encounter: Secondary | ICD-10-CM | POA: Diagnosis not present

## 2019-11-22 DIAGNOSIS — E782 Mixed hyperlipidemia: Secondary | ICD-10-CM

## 2019-11-22 MED ORDER — AMOXICILLIN 500 MG PO CAPS
500.0000 mg | ORAL_CAPSULE | Freq: Three times a day (TID) | ORAL | 0 refills | Status: DC
Start: 2019-11-22 — End: 2021-04-07

## 2019-11-22 MED ORDER — DIPHENOXYLATE-ATROPINE 2.5-0.025 MG PO TABS
1.0000 | ORAL_TABLET | Freq: Four times a day (QID) | ORAL | 0 refills | Status: DC | PRN
Start: 2019-11-22 — End: 2021-04-07

## 2019-11-22 MED FILL — AMOXICILLIN 500 MG CAPSULE: 500 | 10 days supply | Qty: 30 | Fill #0

## 2019-11-22 MED FILL — DIPHENOXYLATE-ATROPINE 2.5-: 2.5-0.025 | 7 days supply | Qty: 30 | Fill #0

## 2019-11-22 NOTE — Assessment & Plan Note (Signed)
Supplement and monitor 

## 2019-11-22 NOTE — Assessment & Plan Note (Signed)
Follows with oncology

## 2019-11-22 NOTE — Progress Notes (Signed)
Subjective:    [name], female    DOB: [birth year], 74 y.o.   MRN: [id number]  Chief Complaint  Patient presents with  . Follow-up  . wants to know if aleve is a good aleve to take for meniscus  . had mole on left clavicle area that fell off    would like for you to still look at it  . should she wait til end of october for skin check with derma  . planned to go to [place] maybe need rx for diarrhea    HPI Patient is in today for follow up on chronic medical concerns. No recent febrile illness or hospitalizations. Has her last appointment with oncology in her 3.5 year saga with cancer treatments. She feels well. She is planning a large trip to [place] in September for a 4 week hike and she is very excited  She injured her right medial meniscus while making oatmeal in May. She is seeeing [facility] and is now in PT but they decided to not do surgey. Denies CP/palp/SOB/HA/congestion/fevers/GI or GU c/o. Taking meds as prescribed  Past Medical History:  Diagnosis Date  . Anemia    h/o iron deficiency secondary to heavy menses  . Arthritis    left hand index finger  . 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 Grief reaction 01/27/2016  . H/O measles    as a child  . History of chicken pox    as a child  . History of PCOS   . Hyperlipidemia, mixed 01/27/2016  . Lymphadenopathy   . 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 Preventative health care 01/27/2016  . Vitamin D deficiency   . Wears glasses     Past Surgical History:  Procedure Laterality Date  . COLONOSCOPY    . IR GENERIC HISTORICAL  05/21/2016   IR FLUORO GUIDE PORT INSERTION RIGHT 05/21/2016 Arne Cleveland, MD [facility]  . IR GENERIC HISTORICAL  05/21/2016   IR US GUIDE VASC ACCESS RIGHT 05/21/2016 Arne Cleveland, MD [facility]  . IR REMOVAL TUN ACCESS W/ PORT W/O FL MOD SED  10/27/2016  . SKIN BIOPSY Left    face  . THYROGLOSSAL DUCT CYST N/A 04/16/2016   Procedure: THYROGLOSSAL  DUCT CYST excision;  Surgeon: [name], MD;  Location: [facility] OR;  Service: ENT;  Laterality: N/A;    Family History  Problem Relation Age of Onset  . Arthritis Mother        rheumatoid  . Heart disease Father        CHF  . COPD Sister        Emphysema, h/o cigarettes  . Other Sister        pituatary tumor  . Arthritis Sister   . Obesity Sister   . Cancer Paternal Grandfather        cancer  . Stroke Maternal Aunt     Social History   Socioeconomic History  . Marital status: Widowed    Spouse name: Not on file  . Number of children: Not on file  . Years of education: Not on file  . Highest education level: Not on file  Occupational History  . Occupation: Psychotherapist  Tobacco Use  . Smoking status: Never Smoker  . Smokeless tobacco: Never Used  Vaping Use  . Vaping Use: Never used  Substance and Sexual Activity  . Alcohol use: Yes    Alcohol/week: 3.0 - 6.0 standard drinks    Types: 3 - 6  Standard drinks or equivalent per week    Comment: 5 glasses of wine a week.  . Drug use: No  . Sexual activity: Never    Partners: Male    Birth control/protection: None  Other Topics Concern  . Not on file  Social History Narrative   Married. Education: [employer]. Exercise: walking, yoga, and bicycling daily for 1-2 hours. Lives alone, works as Estate manager/land agent Strain:   . Difficulty of Paying Living Expenses:   Food Insecurity:   . Worried About Charity fundraiser in the Last Year:   . Arboriculturist in the Last Year:   Transportation Needs:   . Film/video editor (Medical):   [name] Lack of Transportation (Non-Medical):   Physical Activity:   . Days of Exercise per Week:   . Minutes of Exercise per Session:   Stress:   . Feeling of Stress :   Social Connections:   . Frequency of Communication with Friends and Family:   . Frequency of Social Gatherings with Friends and Family:   . Attends Religious Services:    . Active Member of Clubs or Organizations:   . Attends Archivist Meetings:   [name] Marital Status:   Intimate Partner Violence:   . Fear of Current or Ex-Partner:   . Emotionally Abused:   [name] Physically Abused:   . Sexually Abused:     Outpatient Medications Prior to Visit  Medication Sig Dispense Refill  . Calcium-Magnesium-Vitamin D (CALCIUM MAGNESIUM PO) Take by mouth.    . Cholecalciferol (VITAMIN D) 2000 units CAPS Take 2,000 Units by mouth daily.    [name] dexamethasone (DECADRON) 4 MG tablet TAKE 3 TABLETS BY MOUTH TWICE DAILY, TAKE 1 DOSE IN THE MORNING AND 1 DOSE IN THE EVENING THE DAY PRIOR TO Rituxan 12 tablet 6  . ELDERBERRY PO Take by mouth.    . Multiple Vitamins-Minerals (ZINC PO) Take by mouth.    . Omega-3 Fatty Acids (OMEGA-3 PO) Take 2 capsules by mouth daily. Omega 3 1280 mg    . vitamin E (VITAMIN E) 180 MG (400 UNITS) capsule Take 400 Units by mouth daily.     No facility-administered medications prior to visit.    Allergies  Allergen Reactions  . Erythromycin Other (See Comments)    Patient states that she passed out while using this medication ? SYNCOPE ?  [name] Other Anaphylaxis    # # # CATS # # #    Review of Systems  Constitutional: Negative for fever and malaise/fatigue.  HENT: Negative for congestion.   Eyes: Negative for blurred vision.  Respiratory: Negative for shortness of breath.   Cardiovascular: Negative for chest pain, palpitations and leg swelling.  Gastrointestinal: Negative for abdominal pain, blood in stool and nausea.  Genitourinary: Negative for dysuria and frequency.  Musculoskeletal: Positive for joint pain. Negative for falls.  Skin: Negative for rash.  Neurological: Negative for dizziness, loss of consciousness and headaches.  Endo/Heme/Allergies: Negative for environmental allergies.  Psychiatric/Behavioral: Negative for depression. The patient is not nervous/anxious.        Objective:    Physical Exam Vitals and  nursing note reviewed.  Constitutional:      General: She is not in acute distress.    Appearance: She is well-developed.  HENT:     Head: Normocephalic and atraumatic.     Nose: Nose normal.  Eyes:     General:  Right eye: No discharge.        Left eye: No discharge.  Cardiovascular:     Rate and Rhythm: Normal rate and regular rhythm.     Heart sounds: No murmur heard.   Pulmonary:     Effort: Pulmonary effort is normal.     Breath sounds: Normal breath sounds.  Abdominal:     General: Bowel sounds are normal.     Palpations: Abdomen is soft.     Tenderness: There is no abdominal tenderness.  Musculoskeletal:     Cervical back: Normal range of motion and neck supple.  Skin:    General: Skin is warm and dry.  Neurological:     Mental Status: She is alert and oriented to person, place, and time.     BP 132/82 (BP Location: Left Arm, Cuff Size: Normal)   Pulse 79   Temp 98.9 F (37.2 C) (Oral)   Resp 12   Ht _0  (1.753 m)   Wt 164 lb 9.6 oz (74.7 kg)   SpO2 98%   BMI 24.31 kg/m  Wt Readings from Last 3 Encounters:  11/22/19 164 lb 9.6 oz (74.7 kg)  09/24/19 162 lb 11.2 oz (73.8 kg)  07/23/19 160 lb 8 oz (72.8 kg)    Diabetic Foot Exam - Simple   No data filed     Lab Results  Component Value Date   WBC 5.3 11/19/2019   HGB 14.2 11/19/2019   HCT 42.9 11/19/2019   PLT 272 11/19/2019   GLUCOSE 83 11/19/2019   CHOL 222 (H) 07/13/2018   TRIG 69.0 07/13/2018   HDL 85.20 07/13/2018   LDLCALC 123 (H) 07/13/2018   ALT 15 11/19/2019   AST 14 (L) 11/19/2019   NA 141 11/19/2019   K 4.2 11/19/2019   CL 105 11/19/2019   CREATININE 0.73 11/19/2019   BUN 12 11/19/2019   CO2 25 11/19/2019   TSH 1.23 07/13/2018   INR 1.00 10/27/2016   HGBA1C 5.3 03/08/2019    Lab Results  Component Value Date   TSH 1.23 07/13/2018   Lab Results  Component Value Date   WBC 5.3 11/19/2019   HGB 14.2 11/19/2019   HCT 42.9 11/19/2019   MCV 93.7 11/19/2019   PLT  272 11/19/2019   Lab Results  Component Value Date   NA 141 11/19/2019   K 4.2 11/19/2019   CHLORIDE 106 03/07/2017   CO2 25 11/19/2019   GLUCOSE 83 11/19/2019   BUN 12 11/19/2019   CREATININE 0.73 11/19/2019   BILITOT 0.6 11/19/2019   ALKPHOS 133 (H) 11/19/2019   AST 14 (L) 11/19/2019   ALT 15 11/19/2019   PROT 6.9 11/19/2019   ALBUMIN 4.1 11/19/2019   CALCIUM 9.6 11/19/2019   ANIONGAP 11 11/19/2019   EGFR >60 03/07/2017   GFR 89.52 07/13/2018   Lab Results  Component Value Date   CHOL 222 (H) 07/13/2018   Lab Results  Component Value Date   HDL 85.20 07/13/2018   Lab Results  Component Value Date   LDLCALC 123 (H) 07/13/2018   Lab Results  Component Value Date   TRIG 69.0 07/13/2018   Lab Results  Component Value Date   CHOLHDL 3 07/13/2018   Lab Results  Component Value Date   HGBA1C 5.3 03/08/2019       Assessment & Plan:   Problem List Items Addressed This Visit    Hyperlipidemia, mixed    Encouraged heart healthy diet, increase exercise, avoid trans fats,  consider a krill oil cap daily      Vitamin D deficiency    Supplement and monitor      Hyperglycemia    hgba1c acceptable, minimize simple carbs. Increase exercise as tolerated.       Acute medial meniscus tear of right knee    Injured in May and now is in PT after seeing [facility]. It is improving and she is planning a hike in September and it is slowly improving. Is icing intermittently. Consider topical treatments such as Voltaren or lidcoaine topically         I am having [name] "Fraser Din" start on diphenoxylate-atropine and amoxicillin. I am also having her maintain her Omega-3 Fatty Acids (OMEGA-3 PO), Vitamin D, dexamethasone, vitamin E, Calcium-Magnesium-Vitamin D (CALCIUM MAGNESIUM PO), ELDERBERRY PO, and Multiple Vitamins-Minerals (ZINC PO).  Meds ordered this encounter  Medications  . diphenoxylate-atropine (LOMOTIL) 2.5-0.025 MG tablet    Sig: Take 1 tablet by mouth  4 (four) times daily as needed for diarrhea or loose stools.    Dispense:  30 tablet    Refill:  0  . amoxicillin (AMOXIL) 500 MG capsule    Sig: Take 1 capsule (500 mg total) by mouth 3 (three) times daily.    Dispense:  30 capsule    Refill:  0     Penni Homans, MD

## 2019-11-22 NOTE — Assessment & Plan Note (Signed)
hgba1c acceptable, minimize simple carbs. Increase exercise as tolerated.  

## 2019-11-22 NOTE — Assessment & Plan Note (Addendum)
Injured in May and now is in PT after seeing [facility]. It is improving and she is planning a hike in September and it is slowly improving. Is icing intermittently. Consider topical treatments such as Voltaren or lidcoaine topically

## 2019-11-22 NOTE — Patient Instructions (Signed)
Seborrheic Keratosis °A seborrheic keratosis is a common, noncancerous (benign) skin growth. These growths are velvety, waxy, rough, tan, brown, or black spots that appear on the skin. These skin growths can be flat or raised, and scaly. °What are the causes? °The cause of this condition is not known. °What increases the risk? °You are more likely to develop this condition if you: °· Have a family history of seborrheic keratosis. °· Are 50 or older. °· Are pregnant. °· Have had estrogen replacement therapy. °What are the signs or symptoms? °Symptoms of this condition include growths on the face, chest, shoulders, back, or other areas. These growths: °· Are usually painless, but may become irritated and itchy. °· Can be yellow, brown, black, or other colors. °· Are slightly raised or have a flat surface. °· Are sometimes rough or wart-like in texture. °· Are often velvety or waxy on the surface. °· Are round or oval-shaped. °· Often occur in groups, but may occur as a single growth. °How is this diagnosed? °This condition is diagnosed with a medical history and physical exam. °· A sample of the growth may be tested (skin biopsy). °· You may need to see a skin specialist (dermatologist). °How is this treated? °Treatment is not usually needed for this condition, unless the growths are irritated or bleed often. °· You may also choose to have the growths removed if you do not like their appearance. °? Most commonly, these growths are treated with a procedure in which liquid nitrogen is applied to "freeze" off the growth (cryosurgery). °? They may also be burned off with electricity (electrocautery) or removed by scraping (curettage). °Follow these instructions at home: °· Watch your growth for any changes. °· Keep all follow-up visits as told by your health care provider. This is important. °· Do not scratch or pick at the growth or growths. This can cause them to become irritated or infected. °Contact a health care  provider if: °· You suddenly have many new growths. °· Your growth bleeds, itches, or hurts. °· Your growth suddenly becomes larger or changes color. °Summary °· A seborrheic keratosis is a common, noncancerous (benign) skin growth. °· Treatment is not usually needed for this condition, unless the growths are irritated or bleed often. °· Watch your growth for any changes. °· Contact a health care provider if you suddenly have many new growths or your growth suddenly becomes larger or changes color. °· Keep all follow-up visits as told by your health care provider. This is important. °This information is not intended to replace advice given to you by your health care provider. Make sure you discuss any questions you have with your health care provider. °Document Revised: 09/01/2017 Document Reviewed: 09/01/2017 °Elsevier Patient Education © 2020 [employer] ° °

## 2019-11-22 NOTE — Assessment & Plan Note (Signed)
Encouraged heart healthy diet, increase exercise, avoid trans fats, consider a krill oil cap daily 

## 2019-11-26 ENCOUNTER — Inpatient Hospital Stay (HOSPITAL_BASED_OUTPATIENT_CLINIC_OR_DEPARTMENT_OTHER): Payer: Medicare Other | Admitting: Hematology

## 2019-11-26 ENCOUNTER — Other Ambulatory Visit: Payer: Self-pay

## 2019-11-26 VITALS — BP 139/77 | HR 83 | Temp 97.3°F | Resp 18 | Ht 69.0 in | Wt 166.9 lb

## 2019-11-26 DIAGNOSIS — C8298 Follicular lymphoma, unspecified, lymph nodes of multiple sites: Secondary | ICD-10-CM

## 2019-11-27 ENCOUNTER — Telehealth: Payer: Self-pay | Admitting: Hematology

## 2019-11-27 NOTE — Telephone Encounter (Signed)
Called pt per 7/27 sch msg - unable to reach pt . Left message for patient to call back to reschedule.

## 2019-11-30 ENCOUNTER — Telehealth: Payer: Self-pay | Admitting: Hematology

## 2019-11-30 DIAGNOSIS — R269 Unspecified abnormalities of gait and mobility: Secondary | ICD-10-CM | POA: Diagnosis not present

## 2019-11-30 DIAGNOSIS — M25561 Pain in right knee: Secondary | ICD-10-CM | POA: Diagnosis not present

## 2019-11-30 NOTE — Telephone Encounter (Signed)
Scheduled per 07/26 los, patient has been called and notified.  

## 2019-12-05 DIAGNOSIS — R269 Unspecified abnormalities of gait and mobility: Secondary | ICD-10-CM | POA: Diagnosis not present

## 2019-12-05 DIAGNOSIS — M25561 Pain in right knee: Secondary | ICD-10-CM | POA: Diagnosis not present

## 2019-12-12 DIAGNOSIS — M25561 Pain in right knee: Secondary | ICD-10-CM | POA: Diagnosis not present

## 2019-12-12 DIAGNOSIS — R269 Unspecified abnormalities of gait and mobility: Secondary | ICD-10-CM | POA: Diagnosis not present

## 2019-12-17 ENCOUNTER — Encounter: Payer: Self-pay | Admitting: Family Medicine

## 2019-12-17 ENCOUNTER — Encounter: Payer: Self-pay | Admitting: Hematology

## 2019-12-20 DIAGNOSIS — M25561 Pain in right knee: Secondary | ICD-10-CM | POA: Diagnosis not present

## 2019-12-20 DIAGNOSIS — R269 Unspecified abnormalities of gait and mobility: Secondary | ICD-10-CM | POA: Diagnosis not present

## 2020-02-21 DIAGNOSIS — L821 Other seborrheic keratosis: Secondary | ICD-10-CM | POA: Diagnosis not present

## 2020-02-21 DIAGNOSIS — Z808 Family history of malignant neoplasm of other organs or systems: Secondary | ICD-10-CM | POA: Diagnosis not present

## 2020-02-21 DIAGNOSIS — Z86018 Personal history of other benign neoplasm: Secondary | ICD-10-CM | POA: Diagnosis not present

## 2020-02-21 DIAGNOSIS — L814 Other melanin hyperpigmentation: Secondary | ICD-10-CM | POA: Diagnosis not present

## 2020-02-21 DIAGNOSIS — D225 Melanocytic nevi of trunk: Secondary | ICD-10-CM | POA: Diagnosis not present

## 2020-03-07 ENCOUNTER — Encounter: Payer: Self-pay | Admitting: Family Medicine

## 2020-03-07 DIAGNOSIS — Z1231 Encounter for screening mammogram for malignant neoplasm of breast: Secondary | ICD-10-CM | POA: Diagnosis not present

## 2020-03-07 LAB — HM MAMMOGRAPHY

## 2020-03-28 ENCOUNTER — Ambulatory Visit: Payer: Medicare Other | Admitting: Hematology

## 2020-03-28 ENCOUNTER — Other Ambulatory Visit: Payer: Medicare Other

## 2020-04-04 ENCOUNTER — Inpatient Hospital Stay: Payer: Medicare Other | Attending: Hematology

## 2020-04-04 ENCOUNTER — Encounter: Payer: Self-pay | Admitting: Family Medicine

## 2020-04-04 ENCOUNTER — Inpatient Hospital Stay (HOSPITAL_BASED_OUTPATIENT_CLINIC_OR_DEPARTMENT_OTHER): Payer: Medicare Other | Admitting: Hematology

## 2020-04-04 ENCOUNTER — Other Ambulatory Visit: Payer: Self-pay

## 2020-04-04 VITALS — BP 132/84 | HR 80 | Temp 96.7°F | Resp 18 | Ht 69.0 in | Wt 174.2 lb

## 2020-04-04 DIAGNOSIS — C8330 Diffuse large B-cell lymphoma, unspecified site: Secondary | ICD-10-CM

## 2020-04-04 DIAGNOSIS — G620 Drug-induced polyneuropathy: Secondary | ICD-10-CM | POA: Insufficient documentation

## 2020-04-04 DIAGNOSIS — Z8249 Family history of ischemic heart disease and other diseases of the circulatory system: Secondary | ICD-10-CM | POA: Insufficient documentation

## 2020-04-04 DIAGNOSIS — Z823 Family history of stroke: Secondary | ICD-10-CM | POA: Diagnosis not present

## 2020-04-04 DIAGNOSIS — E785 Hyperlipidemia, unspecified: Secondary | ICD-10-CM | POA: Diagnosis not present

## 2020-04-04 DIAGNOSIS — Z809 Family history of malignant neoplasm, unspecified: Secondary | ICD-10-CM | POA: Diagnosis not present

## 2020-04-04 DIAGNOSIS — Z79899 Other long term (current) drug therapy: Secondary | ICD-10-CM | POA: Diagnosis not present

## 2020-04-04 DIAGNOSIS — T451X5A Adverse effect of antineoplastic and immunosuppressive drugs, initial encounter: Secondary | ICD-10-CM | POA: Diagnosis not present

## 2020-04-04 DIAGNOSIS — Z8261 Family history of arthritis: Secondary | ICD-10-CM | POA: Insufficient documentation

## 2020-04-04 DIAGNOSIS — C8338 Diffuse large B-cell lymphoma, lymph nodes of multiple sites: Secondary | ICD-10-CM | POA: Insufficient documentation

## 2020-04-04 DIAGNOSIS — Z7289 Other problems related to lifestyle: Secondary | ICD-10-CM | POA: Diagnosis not present

## 2020-04-04 DIAGNOSIS — I700 Atherosclerosis of aorta: Secondary | ICD-10-CM | POA: Diagnosis not present

## 2020-04-04 DIAGNOSIS — E559 Vitamin D deficiency, unspecified: Secondary | ICD-10-CM | POA: Diagnosis not present

## 2020-04-04 DIAGNOSIS — Z6825 Body mass index (BMI) 25.0-25.9, adult: Secondary | ICD-10-CM | POA: Insufficient documentation

## 2020-04-04 DIAGNOSIS — Z836 Family history of other diseases of the respiratory system: Secondary | ICD-10-CM | POA: Insufficient documentation

## 2020-04-04 LAB — CMP (CANCER CENTER ONLY)
ALT: 19 U/L (ref 0–44)
AST: 18 U/L (ref 15–41)
Albumin: 3.8 g/dL (ref 3.5–5.0)
Alkaline Phosphatase: 115 U/L (ref 38–126)
Anion gap: 7 (ref 5–15)
BUN: 17 mg/dL (ref 8–23)
CO2: 27 mmol/L (ref 22–32)
Calcium: 9.3 mg/dL (ref 8.9–10.3)
Chloride: 105 mmol/L (ref 98–111)
Creatinine: 0.72 mg/dL (ref 0.44–1.00)
GFR, Estimated: >60 mL/min (ref >60)
Glucose, Bld: 100 mg/dL — ABNORMAL HIGH (ref 70–99)
Potassium: 3.9 mmol/L (ref 3.5–5.1)
Sodium: 139 mmol/L (ref 135–145)
Total Bilirubin: 0.4 mg/dL (ref 0.3–1.2)
Total Protein: 6.5 g/dL (ref 6.5–8.1)

## 2020-04-04 LAB — CBC WITH DIFFERENTIAL/PLATELET
Abs Immature Granulocytes: 0.01 10*3/uL (ref 0.00–0.07)
Basophils Absolute: 0 10*3/uL (ref 0.0–0.1)
Basophils Relative: 1 %
Eosinophils Absolute: 0.2 10*3/uL (ref 0.0–0.5)
Eosinophils Relative: 4 %
HCT: 41.6 % (ref 36.0–46.0)
Hemoglobin: 13.6 g/dL (ref 12.0–15.0)
Immature Granulocytes: 0 %
Lymphocytes Relative: 20 %
Lymphs Abs: 0.9 10*3/uL (ref 0.7–4.0)
MCH: 30.7 pg (ref 26.0–34.0)
MCHC: 32.7 g/dL (ref 30.0–36.0)
MCV: 93.9 fL (ref 80.0–100.0)
Monocytes Absolute: 0.4 10*3/uL (ref 0.1–1.0)
Monocytes Relative: 8 %
Neutro Abs: 3.2 10*3/uL (ref 1.7–7.7)
Neutrophils Relative %: 67 %
Platelets: 294 10*3/uL (ref 150–400)
RBC: 4.43 MIL/uL (ref 3.87–5.11)
RDW: 12.9 % (ref 11.5–15.5)
WBC: 4.7 10*3/uL (ref 4.0–10.5)
nRBC: 0 % (ref 0.0–0.2)

## 2020-04-04 LAB — LACTATE DEHYDROGENASE: LDH: 192 U/L (ref 98–192)

## 2020-04-04 NOTE — Progress Notes (Signed)
[name]     HEMATOLOGY/ONCOLOGY CLINIC NOTE  Date of Service:  04/04/20     Patient Care Team: [name], MD as PCP - General (Family Medicine) [name] M.D. (ENT)  CHIEF COMPLAINTS/PURPOSE OF CONSULTATION:   F/u for follicular lymphoma/DLBCL  HISTORY OF PRESENTING ILLNESS:   Please see previous note for details of initial presentation.  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 She has completed 6 cycles of R-CHOP and 18 cycles of maintenance Rituxan. The patient's last visit with [place] was on 11/26/2019. The pt reports that she is doing well overall.  The pt reports that she has felt well in the interim. She denies any new symptoms or concerns.  Lab results today (04/04/20) of CBC w/diff and CMP is as follows: all values are WNL except for Glucose at 100. 04/04/2020 LDH at 192  On review of systems, pt denies back pain, abdominal pain, fevers, chills, night sweats and any other symptoms.   MEDICAL HISTORY:  Past Medical History:  Diagnosis Date  . Anemia    h/o iron deficiency secondary to heavy menses  . Arthritis    left hand index finger  . 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 Grief reaction 01/27/2016  . H/O measles    as a child  . History of chicken pox    as a child  . History of PCOS   . Hyperlipidemia, mixed 01/27/2016  . Lymphadenopathy   . 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 Preventative health care 01/27/2016  . Vitamin D deficiency   . Wears glasses     SURGICAL HISTORY: Past Surgical History:  Procedure Laterality Date  . COLONOSCOPY    . IR GENERIC HISTORICAL  05/21/2016   IR FLUORO GUIDE PORT INSERTION RIGHT 05/21/2016 Arne Cleveland, MD [facility]  . IR GENERIC HISTORICAL  05/21/2016   IR US GUIDE VASC ACCESS RIGHT 05/21/2016 Arne Cleveland, MD [facility]  . IR REMOVAL TUN ACCESS W/ PORT W/O FL MOD SED  10/27/2016  . SKIN BIOPSY Left    face  .  THYROGLOSSAL DUCT CYST N/A 04/16/2016   Procedure: THYROGLOSSAL DUCT CYST excision;  Surgeon: [name], MD;  Location: [facility];  Service: ENT;  Laterality: N/A;    SOCIAL HISTORY: Social History   Socioeconomic History  . Marital status: Widowed    Spouse name: Not on file  . Number of children: Not on file  . Years of education: Not on file  . Highest education level: Not on file  Occupational History  . Occupation: Psychotherapist  Tobacco Use  . Smoking status: Never Smoker  . Smokeless tobacco: Never Used  Vaping Use  . Vaping Use: Never used  Substance and Sexual Activity  . Alcohol use: Yes    Alcohol/week: 3.0 - 6.0 standard drinks    Types: 3 - 6 Standard drinks or equivalent per week    Comment: 5 glasses of wine a week.  . Drug use: No  . Sexual activity: Never    Partners: Male    Birth control/protection: None  Other Topics Concern  . Not on file  Social History Narrative   Married. Education: [employer]. Exercise: walking, yoga, and bicycling daily for 1-2 hours. Lives alone, works as Estate manager/land agent Strain:   . Difficulty of Paying Living Expenses: Not on file  Food Insecurity:   . Worried About Running  Out of Food in the Last Year: Not on file  . Ran Out of Food in the Last Year: Not on file  Transportation Needs:   . Lack of Transportation (Medical): Not on file  . Lack of Transportation (Non-Medical): Not on file  Physical Activity:   . Days of Exercise per Week: Not on file  . Minutes of Exercise per Session: Not on file  Stress:   . Feeling of Stress : Not on file  Social Connections:   . Frequency of Communication with Friends and Family: Not on file  . Frequency of Social Gatherings with Friends and Family: Not on file  . Attends Religious Services: Not on file  . Active Member of Clubs or Organizations: Not on file  . Attends Archivist Meetings: Not on file  . Marital Status:  Not on file  Intimate Partner Violence:   . Fear of Current or Ex-Partner: Not on file  . Emotionally Abused: Not on file  . Physically Abused: Not on file  . Sexually Abused: Not on file    FAMILY HISTORY: Family History  Problem Relation Age of Onset  . Arthritis Mother        rheumatoid  . Heart disease Father        CHF  . COPD Sister        Emphysema, h/o cigarettes  . Other Sister        pituatary tumor  . Arthritis Sister   . Obesity Sister   . Cancer Paternal Grandfather        cancer  . Stroke Maternal Aunt     ALLERGIES:  is allergic to erythromycin and other.  MEDICATIONS:  Current Outpatient Medications  Medication Sig Dispense Refill  . amoxicillin (AMOXIL) 500 MG capsule Take 1 capsule (500 mg total) by mouth 3 (three) times daily. 30 capsule 0  . Calcium-Magnesium-Vitamin D (CALCIUM MAGNESIUM PO) Take by mouth.    . Cholecalciferol (VITAMIN D) 2000 units CAPS Take 2,000 Units by mouth daily.    [name] dexamethasone (DECADRON) 4 MG tablet TAKE 3 TABLETS BY MOUTH TWICE DAILY, TAKE 1 DOSE IN THE MORNING AND 1 DOSE IN THE EVENING THE DAY PRIOR TO Rituxan 12 tablet 6  . diphenoxylate-atropine (LOMOTIL) 2.5-0.025 MG tablet Take 1 tablet by mouth 4 (four) times daily as needed for diarrhea or loose stools. 30 tablet 0  . ELDERBERRY PO Take by mouth.    . Multiple Vitamins-Minerals (ZINC PO) Take by mouth.    . Omega-3 Fatty Acids (OMEGA-3 PO) Take 2 capsules by mouth daily. Omega 3 1280 mg    . vitamin E (VITAMIN E) 180 MG (400 UNITS) capsule Take 400 Units by mouth daily.     No current facility-administered medications for this visit.    REVIEW OF SYSTEMS:   A 10+ POINT REVIEW OF SYSTEMS WAS OBTAINED including neurology, dermatology, psychiatry, cardiac, respiratory, lymph, extremities, GI, GU, Musculoskeletal, constitutional, breasts, reproductive, HEENT.  All pertinent positives are noted in the HPI.  All others are negative.   PHYSICAL EXAMINATION:  ECOG  PERFORMANCE STATUS: 0 - Asymptomatic  Vitals:   04/04/20 1247  BP: 132/84  Pulse: 80  Resp: 18  Temp: (!) 96.7 F (35.9 C)  SpO2: 99%   Filed Weights   04/04/20 1247  Weight: 174 lb 3.2 oz (79 kg)   .Body mass index is 25.72 kg/m.   GENERAL:alert, in no acute distress and comfortable SKIN: no acute rashes, no significant lesions EYES: conjunctiva  are pink and non-injected, sclera anicteric OROPHARYNX: MMM, no exudates, no oropharyngeal erythema or ulceration NECK: supple, no JVD LYMPH:  no palpable lymphadenopathy in the cervical, axillary or inguinal regions LUNGS: clear to auscultation b/l with normal respiratory effort HEART: regular rate & rhythm ABDOMEN:  normoactive bowel sounds , non tender, not distended. No palpable hepatosplenomegaly.  Extremity: no pedal edema PSYCH: alert & oriented x 3 with fluent speech NEURO: no focal motor/sensory deficits  LABORATORY DATA:  I have reviewed the data as listed  . CBC Latest Ref Rng & Units 04/04/2020 11/19/2019 09/24/2019  WBC 4.0 - 10.5 K/uL 4.7 5.3 9.0  Hemoglobin 12.0 - 15.0 g/dL 13.6 14.2 12.9  Hematocrit 36 - 46 % 41.6 42.9 37.5  Platelets 150 - 400 K/uL 294 272 237   CBC    Component Value Date/Time   WBC 4.7 04/04/2020 1204   RBC 4.43 04/04/2020 1204   HGB 13.6 04/04/2020 1204   HGB 13.9 05/10/2018 0946   HGB 13.3 03/07/2017 0842   HCT 41.6 04/04/2020 1204   HCT 39.1 03/07/2017 0842   PLT 294 04/04/2020 1204   PLT 272 05/10/2018 0946   PLT 283 03/07/2017 0842   MCV 93.9 04/04/2020 1204   MCV 91.4 03/07/2017 0842   MCH 30.7 04/04/2020 1204   MCHC 32.7 04/04/2020 1204   RDW 12.9 04/04/2020 1204   RDW 13.3 03/07/2017 0842   LYMPHSABS 0.9 04/04/2020 1204   LYMPHSABS 0.4 (L) 03/07/2017 0842   MONOABS 0.4 04/04/2020 1204   MONOABS 0.7 03/07/2017 0842   EOSABS 0.2 04/04/2020 1204   EOSABS 0.0 03/07/2017 0842   BASOSABS 0.0 04/04/2020 1204   BASOSABS 0.0 03/07/2017 0842    . CMP Latest Ref Rng & Units  04/04/2020 11/19/2019 09/24/2019  Glucose 70 - 99 mg/dL 100(H) 83 135(H)  BUN 8 - 23 mg/dL 17 12 18   Creatinine 0.44 - 1.00 mg/dL 0.72 0.73 0.72  Sodium 135 - 145 mmol/L 139 141 138  Potassium 3.5 - 5.1 mmol/L 3.9 4.2 4.0  Chloride 98 - 111 mmol/L 105 105 106  CO2 22 - 32 mmol/L 27 25 22   Calcium 8.9 - 10.3 mg/dL 9.3 9.6 9.6  Total Protein 6.5 - 8.1 g/dL 6.5 6.9 6.6  Total Bilirubin 0.3 - 1.2 mg/dL 0.4 0.6 0.5  Alkaline Phos 38 - 126 U/L 115 133(H) 108  AST 15 - 41 U/L 18 14(L) 16  ALT 0 - 44 U/L 19 15 20    Component     Latest Ref Rng & Units 05/07/2016  Hepatitis B Surface Ag     Negative Negative  Hep B Core Ab, Tot     Negative Negative  Hep C Virus Ab     0.0 - 0.9 s/co ratio <0.1   . Lab Results  Component Value Date   LDH 192 04/04/2020         RADIOGRAPHIC STUDIES:   ASSESSMENT & PLAN:   74 y.o. 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  Initial PET/CT showed Scattered small mildly hypermetabolic lymph nodes within the neck, left hilum, left axilla and left groin, potentially related the patient's lymphoma. Focal hypermetabolic activity within the left sternal manubrium and left iliac bone, also potentially related to the patient's lymphoma. No evidence of solid visceral organ involvement.  CT guided bone marrow biopsy done -- no evidence of lymphoma involving Bone marrow. Normal LDH. No  constitutional symptoms. Patient has no significant medical comorbidities at baseline. ECHO with nl EF as expected.  Patient has completed 6 cycles of R CHOP PET/CT s/p 6 cycles of R-CHOP shows complete metabolic response CT chest/abd/pelvis 07/07/2017 - showed no radipgraphic evidence of lymphoma progression at this time.  Lab Results  Component Value Date   LDH 192 04/04/2020   11/06/2018 CT C/A/P with results revealing "1. No evidence of recurrent lymphoma. 2.   Aortic atherosclerosis (ICD10-170.0)." 11/19/2019 CT C/A/P ([id number]) ([id number]) revealed "No evidence of disease recurrence."   2) Rituxan hypersensitivity (mild grade 1 hives on the back ) - resolved with solumedrol and antihistamines. No issues with dexamethasone pretreatment -Patient will continue dexamethasone premedication to reduce risk of Rituxan hypersensitivity.  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 -No lab or clinical evidence of FL recurrence at this time. Will continue watchful observation. -Will see back in 4 months with labs   FOLLOW UP: RTC with Dr Irene Limbo with labs in 4 months   The total time spent in the appt was 25 minutes and more than 50% was on counseling and direct patient cares.  All of the patient's questions were answered with apparent satisfaction. The patient knows to call the clinic with any problems, questions or concerns.   Sullivan Lone MD [place] AAHIVMS [facility] Hematology/Oncology Physician [facility]  (Office):       [phone] (Work cell):  [phone] (Fax):           [phone]  I, Yevette Edwards, am acting as a scribe for Dr. Sullivan Lone.   .I have reviewed the above documentation for accuracy and completeness, and I agree with the above. Brunetta Genera MD

## 2020-04-08 ENCOUNTER — Telehealth: Payer: Self-pay | Admitting: Hematology

## 2020-04-08 NOTE — Telephone Encounter (Signed)
Called pt per 12/6 sch msg - no answer  - left message for pt to call back to reschedule.

## 2020-04-10 ENCOUNTER — Ambulatory Visit (INDEPENDENT_AMBULATORY_CARE_PROVIDER_SITE_OTHER): Payer: Medicare Other

## 2020-04-10 ENCOUNTER — Other Ambulatory Visit: Payer: Self-pay

## 2020-04-10 VITALS — BP 126/84 | HR 69 | Temp 98.2°F | Resp 16 | Ht 69.0 in | Wt 173.2 lb

## 2020-04-10 DIAGNOSIS — Z0000 Encounter for general adult medical examination without abnormal findings: Secondary | ICD-10-CM | POA: Diagnosis not present

## 2020-04-10 NOTE — Progress Notes (Signed)
Subjective:   Lindsay Harrison is a 74 y.o. female who presents for Medicare Annual (Subsequent) preventive examination.  Review of Systems     Cardiac Risk Factors include: advanced age (>1men, >67 women);dyslipidemia     Objective:    Today's Vitals   04/10/20 1234  BP: 126/84  Pulse: 69  Resp: 16  Temp: 98.2 F (36.8 C)  TempSrc: Oral  SpO2: 96%  Weight: 173 lb 3.2 oz (78.6 kg)  Height: 5\' 9"  (1.753 m)   Body mass index is 25.58 kg/m.  Advanced Directives 04/10/2020 11/26/2019 09/24/2019 07/23/2019 05/21/2019 02/15/2019 12/07/2018  Does Patient Have a Medical Advance Directive? Yes Yes Yes Yes Yes Yes Yes  Type of Paramedic of [place];Living will [place];Living will [place];Living will [place];Living will [place];Living will [place];Living will [place];Living will  Does patient want to make changes to medical advance directive? - No - Patient declined No - Patient declined No - Patient declined No - Patient declined No - Patient declined -  Copy of [place] in Chart? Yes - validated most recent copy scanned in chart (See row information) No - copy requested Yes - validated most recent copy scanned in chart (See row information) - Yes - validated most recent copy scanned in chart (See row information) Yes - validated most recent copy scanned in chart (See row information) Yes - validated most recent copy scanned in chart (See row information)    Current Medications (verified) Outpatient Encounter Medications as of 04/10/2020  Medication Sig  . Calcium-Magnesium-Vitamin D (CALCIUM MAGNESIUM PO) Take by mouth.  . Cholecalciferol (VITAMIN D) 2000 units CAPS Take 2,000 Units by mouth daily.  [name] ELDERBERRY PO Take by mouth.  . Multiple Vitamins-Minerals (ZINC PO) Take by mouth.  . Omega-3 Fatty Acids (OMEGA-3 PO)  Take 2 capsules by mouth daily. Omega 3 1280 mg  . vitamin E 180 MG (400 UNITS) capsule Take 400 Units by mouth daily.  [name] amoxicillin (AMOXIL) 500 MG capsule Take 1 capsule (500 mg total) by mouth 3 (three) times daily. (Patient not taking: Reported on 04/10/2020)  . dexamethasone (DECADRON) 4 MG tablet TAKE 3 TABLETS BY MOUTH TWICE DAILY, TAKE 1 DOSE IN THE MORNING AND 1 DOSE IN THE EVENING THE DAY PRIOR TO Rituxan (Patient not taking: Reported on 04/10/2020)  . diphenoxylate-atropine (LOMOTIL) 2.5-0.025 MG tablet Take 1 tablet by mouth 4 (four) times daily as needed for diarrhea or loose stools. (Patient not taking: Reported on 04/10/2020)   No facility-administered encounter medications on file as of 04/10/2020.    Allergies (verified) Erythromycin and Other   History: Past Medical History:  Diagnosis Date  . Anemia    h/o iron deficiency secondary to heavy menses  . Arthritis    left hand index finger  . 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 Grief reaction 01/27/2016  . H/O measles    as a child  . History of chicken pox    as a child  . History of PCOS   . Hyperlipidemia, mixed 01/27/2016  . Lymphadenopathy   . 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 Preventative health care 01/27/2016  . Vitamin D deficiency   . Wears glasses    Past Surgical History:  Procedure Laterality Date  . COLONOSCOPY    . IR GENERIC HISTORICAL  05/21/2016   IR FLUORO GUIDE PORT INSERTION RIGHT 05/21/2016 Quillian Quince  Vernard Gambles, MD [facility]INTER[facility]  . IR GENERIC HISTORICAL  05/21/2016   IR US GUIDE VASC ACCESS RIGHT 05/21/2016 Arne Cleveland, MD [facility]  . IR REMOVAL TUN ACCESS W/ PORT W/O FL MOD SED  10/27/2016  . SKIN BIOPSY Left    face  . THYROGLOSSAL DUCT CYST N/A 04/16/2016   Procedure: THYROGLOSSAL DUCT CYST excision;  Surgeon: [name], MD;  Location: [facility] OR;  Service: ENT;  Laterality: N/A;   Family History  Problem Relation Age of Onset  . Arthritis Mother        rheumatoid  .  Heart disease Father        CHF  . COPD Sister        Emphysema, h/o cigarettes  . Other Sister        pituatary tumor  . Arthritis Sister   . Obesity Sister   . Cancer Paternal Grandfather        cancer  . Stroke Maternal Aunt    Social History   Socioeconomic History  . Marital status: Widowed    Spouse name: Not on file  . Number of children: Not on file  . Years of education: Not on file  . Highest education level: Not on file  Occupational History  . Occupation: Psychotherapist  Tobacco Use  . Smoking status: Never Smoker  . Smokeless tobacco: Never Used  Vaping Use  . Vaping Use: Never used  Substance and Sexual Activity  . Alcohol use: Yes    Alcohol/week: 3.0 - 6.0 standard drinks    Types: 3 - 6 Standard drinks or equivalent per week    Comment: 5 glasses of wine a week.  . Drug use: No  . Sexual activity: Never    Partners: Male    Birth control/protection: None  Other Topics Concern  . Not on file  Social History Narrative   Married. Education: [employer]. Exercise: walking, yoga, and bicycling daily for 1-2 hours. Lives alone, works as Estate manager/land agent Strain: [place]   . Difficulty of Paying Living Expenses: Not hard at all  Food Insecurity: No Food Insecurity  . Worried About Charity fundraiser in the Last Year: Never true  . Ran Out of Food in the Last Year: Never true  Transportation Needs: No Transportation Needs  . Lack of Transportation (Medical): No  . Lack of Transportation (Non-Medical): No  Physical Activity: Sufficiently Active  . Days of Exercise per Week: 7 days  . Minutes of Exercise per Session: 90 min  Stress: No Stress Concern Present  . Feeling of Stress : Not at all  Social Connections: Moderately Integrated  . Frequency of Communication with Friends and Family: More than three times a week  . Frequency of Social Gatherings with Friends and Family: More than three times a week   . Attends Religious Services: More than 4 times per year  . Active Member of Clubs or Organizations: Yes  . Attends Archivist Meetings: More than 4 times per year  . Marital Status: Widowed    Tobacco Counseling Counseling given: Not Answered   Clinical Intake:  Pre-visit preparation completed: Yes  Pain : No/denies pain     Nutritional Status: BMI 25 -29 Overweight Nutritional Risks: None Diabetes: No  How often do you need to have someone help you when you read instructions, pamphlets, or other written materials from your doctor or pharmacy?: 1 - Never What is the last  grade level you completed in school?: Phd  Diabetes? No      Interpreter Needed?: No  Information entered by :: Caroleen Hamman LPN   Activities of Daily Living In your present state of health, do you have any difficulty performing the following activities: 04/10/2020  Hearing? N  Vision? N  Difficulty concentrating or making decisions? N  Walking or climbing stairs? N  Dressing or bathing? N  Doing errands, shopping? N  Preparing Food and eating ? N  Using the Toilet? N  In the past six months, have you accidently leaked urine? N  Do you have problems with loss of bowel control? N  Managing your Medications? N  Managing your Finances? N  Housekeeping or managing your Housekeeping? N  Some recent data might be hidden    Patient Care Team: [name], MD as PCP - General (Family Medicine)  Indicate any recent Medical Services you may have received from other than [facility] providers in the past year (date may be approximate).     Assessment:   This is a routine wellness examination for Lindsay Harrison.  Hearing/Vision screen  Hearing Screening   125Hz  250Hz  500Hz  1000Hz  2000Hz  3000Hz  4000Hz  6000Hz  8000Hz   Right ear:           Left ear:           Comments: No issues  Vision Screening Comments: Last eye exam-02/2020-Dr. Lyles  Dietary issues and exercise activities  discussed: Current Exercise Habits: Home exercise routine, Type of exercise: walking;yoga, Time (Minutes): 60, Frequency (Times/Week): 7, Weekly Exercise (Minutes/Week): 420, Intensity: Moderate, Exercise limited by: None identified  Goals      Patient Stated   .  continue being socially and physically active (pt-stated)      Depression Screen PHQ 2/9 Scores 04/10/2020 02/15/2019 07/13/2018 02/02/2018 01/31/2017 01/31/2017 08/06/2015  PHQ - 2 Score 0 0 0 0 0 0 0    Fall Risk Fall Risk  04/10/2020 02/15/2019 07/13/2018 03/22/2018 02/02/2018  Falls in the past year? 0 0 0 0 No  Comment - - - Emmi Telephone Survey: data to providers prior to load -  Number falls in past yr: 0 0 - - -  Injury with Fall? 0 0 - - -  Follow up Falls prevention discussed - - - -    FALL RISK PREVENTION PERTAINING TO THE HOME:  Any stairs in or around the home? No  Home free of loose throw rugs in walkways, pet beds, electrical cords, etc? Yes  Adequate lighting in your home to reduce risk of falls? Yes   ASSISTIVE DEVICES UTILIZED TO PREVENT FALLS:  Life alert? No  Use of a cane, walker or w/c? No  Grab bars in the bathroom? Yes  Shower chair or bench in shower? No  Elevated toilet seat or a handicapped toilet? No   TIMED UP AND GO:  Was the test performed? Yes .  Length of time to ambulate 10 feet: 9 sec.   Gait steady and fast without use of assistive device  Cognitive Function:Normal cognitive status assessed by direct observation by this Nurse Health Advisor. No abnormalities found.          Immunizations Immunization History  Administered Date(s) Administered  . Fluad Quad(high Dose 65+) 01/15/2019  . Hepatitis A 07/17/2007, 04/04/2008  . Hepatitis B 07/17/2007, 09/11/2007, 02/15/2008  . Influenza Split 02/24/2013  . Influenza, High Dose Seasonal PF 01/27/2016  . Influenza,inj,Quad PF,6+ Mos 01/16/2014, 02/05/2015, 01/10/2017, 01/23/2018  . Influenza-Unspecified 01/29/2020  .  PFIZER  SARS-COV-2 Vaccination 06/07/2019, 07/02/2019, 12/28/2019  . Pneumococcal Conjugate-13 01/16/2014  . Pneumococcal Polysaccharide-23 10/18/2016  . Pneumococcal-Unspecified 02/12/2009  . Td 10/27/2005  . Tdap 02/05/2015  . Typhoid Inactivated 07/17/2007  . Zoster 06/26/2007    TDAP status: Up to date  Flu Vaccine status: Up to date  Pneumococcal vaccine status: Up to date  Covid-19 vaccine status: Completed vaccines  Qualifies for Shingles Vaccine? Yes   Zostavax completed Yes   Shingrix Completed?: No.    Education has been provided regarding the importance of this vaccine. Patient has been advised to call insurance company to determine out of pocket expense if they have not yet received this vaccine. Advised may also receive vaccine at local pharmacy or Health Dept. Verbalized acceptance and understanding.  Screening Tests Health Maintenance  Topic Date Due  . COVID-19 Vaccine (4 - Booster for Pfizer series) 06/29/2020  . COLONOSCOPY  03/14/2021  . MAMMOGRAM  03/07/2022  . TETANUS/TDAP  02/04/2025  . INFLUENZA VACCINE  Completed  . DEXA SCAN  Completed  . Hepatitis C Screening  Completed  . PNA vac Low Risk Adult  Completed    Health Maintenance  There are no preventive care reminders to display for this patient.  Colorectal cancer screening: Type of screening: Colonoscopy. Completed 03/15/2011. Repeat every 10 years  Mammogram status: Completed Bilateral-03/07/2020. Repeat every year  Bone Density status:-Due-declined today. Patient would like to have done with mammogram next year.  Lung Cancer Screening: (Low Dose CT Chest recommended if Age 22-80 years, 30 pack-year currently smoking OR have quit w/in 15years.) does not qualify.    Additional Screening:  Hepatitis C Screening: Completed 05/07/2016  Vision Screening: Recommended annual ophthalmology exams for early detection of glaucoma and other disorders of the eye. Is the patient up to date with their annual eye  exam?  Yes  Who is the provider or what is the name of the office in which the patient attends annual eye exams? Dr. Prudencio Burly   Dental Screening: Recommended annual dental exams for proper oral hygiene  Community Resource Referral / Chronic Care Management: CRR required this visit?  No   CCM required this visit?  No      Plan:     I have personally reviewed and noted the following in the patient's chart:   . Medical and social history . Use of alcohol, tobacco or illicit drugs  . Current medications and supplements . Functional ability and status . Nutritional status . Physical activity . Advanced directives . List of other physicians . Hospitalizations, surgeries, and ER visits in previous 12 months . Vitals . Screenings to include cognitive, depression, and falls . Referrals and appointments  In addition, I have reviewed and discussed with patient certain preventive protocols, quality metrics, and best practice recommendations. A written personalized care plan for preventive services as well as general preventive health recommendations were provided to patient.     Marta Antu, LPN   27/11/4126  Nurse Health Advisor  Nurse Notes: None

## 2020-04-10 NOTE — Patient Instructions (Signed)
Lindsay Harrison , Thank you for taking time to come for your Medicare Wellness Visit. I appreciate your ongoing commitment to your health goals. Please review the following plan we discussed and let me know if I can assist you in the future.   Screening recommendations/referrals: Colonoscopy: Completed 03/05/2011-Due 03/14/2021 Mammogram: Completed 03/07/2020- Due 03/07/2021 Bone Density: Due- Per our conversation, to be scheduled with mammogram next year. Recommended yearly ophthalmology/optometry visit for glaucoma screening and checkup Recommended yearly dental visit for hygiene and checkup  Vaccinations: Influenza vaccine: Up to date Pneumococcal vaccine: Completed vaccines Tdap vaccine: Up to date-Due-02/04/2025 Shingles vaccine: Discuss with pharmacy   Covid-19:Completed vaccines  Advanced directives: Copy in chart  Conditions/risks identified: See problem list  Next appointment: Follow up in one year for your annual wellness visit 04/16/2021 @ 12:40   Preventive Care 74 Years and Olderer, Female Preventive care refers to lifestyle choices and visits with your health care provider that can promote health and wellness. What does preventive care include?  A yearly physical exam. This is also called an annual well check.  Dental exams once or twice a year.  Routine eye exams. Ask your health care provider how often you should have your eyes checked.  Personal lifestyle choices, including:  Daily care of your teeth and gums.  Regular physical activity.  Eating a healthy diet.  Avoiding tobacco and drug use.  Limiting alcohol use.  Practicing safe sex.  Taking low-dose aspirin every day.  Taking vitamin and mineral supplements as recommended by your health care provider. What happens during an annual well check? The services and screenings done by your health care provider during your annual well check will depend on your age, overall health, lifestyle risk factors, and  family history of disease. Counseling  Your health care provider may ask you questions about your:  Alcohol use.  Tobacco use.  Drug use.  Emotional well-being.  Home and relationship well-being.  Sexual activity.  Eating habits.  History of falls.  Memory and ability to understand (cognition).  Work and work Statistician.  Reproductive health. Screening  You may have the following tests or measurements:  Height, weight, and BMI.  Blood pressure.  Lipid and cholesterol levels. These may be checked every 5 years, or more frequently if you are over 74 years old.  Skin check.  Lung cancer screening. You may have this screening every year starting at age 74 if you have a 30-pack-year history of smoking and currently smoke or have quit within the past 15 years.  Fecal occult blood test (FOBT) of the stool. You may have this test every year starting at age 74.  Flexible sigmoidoscopy or colonoscopy. You may have a sigmoidoscopy every 5 years or a colonoscopy every 10 years starting at age 74.  Hepatitis C blood test.  Hepatitis B blood test.  Sexually transmitted disease (STD) testing.  Diabetes screening. This is done by checking your blood sugar (glucose) after you have not eaten for a while (fasting). You may have this done every 1-3 years.  Bone density scan. This is done to screen for osteoporosis. You may have this done starting at age 74.  Mammogram. This may be done every 1-2 years. Talk to your health care provider about how often you should have regular mammograms. Talk with your health care provider about your test results, treatment options, and if necessary, the need for more tests. Vaccines  Your health care provider may recommend certain vaccines, such as:  Influenza vaccine.  This is recommended every year.  Tetanus, diphtheria, and acellular pertussis (Tdap, Td) vaccine. You may need a Td booster every 10 years.  Zoster vaccine. You may need this  after age 20.  Pneumococcal 13-valent conjugate (PCV13) vaccine. One dose is recommended after age 34.  Pneumococcal polysaccharide (PPSV23) vaccine. One dose is recommended after age 58. Talk to your health care provider about which screenings and vaccines you need and how often you need them. This information is not intended to replace advice given to you by your health care provider. Make sure you discuss any questions you have with your health care provider. Document Released: 05/16/2015 Document Revised: 01/07/2016 Document Reviewed: 02/18/2015 Elsevier Interactive Patient Education  2017 [place] Prevention in the Home Falls can cause injuries. They can happen to people of all ages. There are many things you can do to make your home safe and to help prevent falls. What can I do on the outside of my home?  Regularly fix the edges of walkways and driveways and fix any cracks.  Remove anything that might make you trip as you walk through a door, such as a raised step or threshold.  Trim any bushes or trees on the path to your home.  Use bright outdoor lighting.  Clear any walking paths of anything that might make someone trip, such as rocks or tools.  Regularly check to see if handrails are loose or broken. Make sure that both sides of any steps have handrails.  Any raised decks and porches should have guardrails on the edges.  Have any leaves, snow, or ice cleared regularly.  Use sand or salt on walking paths during winter.  Clean up any spills in your garage right away. This includes oil or grease spills. What can I do in the bathroom?  Use night lights.  Install grab bars by the toilet and in the tub and shower. Do not use towel bars as grab bars.  Use non-skid mats or decals in the tub or shower.  If you need to sit down in the shower, use a plastic, non-slip stool.  Keep the floor dry. Clean up any water that spills on the floor as soon as it  happens.  Remove soap buildup in the tub or shower regularly.  Attach bath mats securely with double-sided non-slip rug tape.  Do not have throw rugs and other things on the floor that can make you trip. What can I do in the bedroom?  Use night lights.  Make sure that you have a light by your bed that is easy to reach.  Do not use any sheets or blankets that are too big for your bed. They should not hang down onto the floor.  Have a firm chair that has side arms. You can use this for support while you get dressed.  Do not have throw rugs and other things on the floor that can make you trip. What can I do in the kitchen?  Clean up any spills right away.  Avoid walking on wet floors.  Keep items that you use a lot in easy-to-reach places.  If you need to reach something above you, use a strong step stool that has a grab bar.  Keep electrical cords out of the way.  Do not use floor polish or wax that makes floors slippery. If you must use wax, use non-skid floor wax.  Do not have throw rugs and other things on the floor that can make  you trip. What can I do with my stairs?  Do not leave any items on the stairs.  Make sure that there are handrails on both sides of the stairs and use them. Fix handrails that are broken or loose. Make sure that handrails are as long as the stairways.  Check any carpeting to make sure that it is firmly attached to the stairs. Fix any carpet that is loose or worn.  Avoid having throw rugs at the top or bottom of the stairs. If you do have throw rugs, attach them to the floor with carpet tape.  Make sure that you have a light switch at the top of the stairs and the bottom of the stairs. If you do not have them, ask someone to add them for you. What else can I do to help prevent falls?  Wear shoes that:  Do not have high heels.  Have rubber bottoms.  Are comfortable and fit you well.  Are closed at the toe. Do not wear sandals.  If you  use a stepladder:  Make sure that it is fully opened. Do not climb a closed stepladder.  Make sure that both sides of the stepladder are locked into place.  Ask someone to hold it for you, if possible.  Clearly mark and make sure that you can see:  Any grab bars or handrails.  First and last steps.  Where the edge of each step is.  Use tools that help you move around (mobility aids) if they are needed. These include:  Canes.  Walkers.  Scooters.  Crutches.  Turn on the lights when you go into a dark area. Replace any light bulbs as soon as they burn out.  Set up your furniture so you have a clear path. Avoid moving your furniture around.  If any of your floors are uneven, fix them.  If there are any pets around you, be aware of where they are.  Review your medicines with your doctor. Some medicines can make you feel dizzy. This can increase your chance of falling. Ask your doctor what other things that you can do to help prevent falls. This information is not intended to replace advice given to you by your health care provider. Make sure you discuss any questions you have with your health care provider. Document Released: 02/13/2009 Document Revised: 09/25/2015 Document Reviewed: 05/24/2014 Elsevier Interactive Patient Education  2017 [employer].

## 2020-05-16 ENCOUNTER — Encounter: Payer: Self-pay | Admitting: Family Medicine

## 2020-05-16 ENCOUNTER — Encounter: Payer: Self-pay | Admitting: Hematology

## 2020-05-24 ENCOUNTER — Encounter: Payer: Self-pay | Admitting: Family Medicine

## 2020-05-24 ENCOUNTER — Encounter: Payer: Self-pay | Admitting: Hematology

## 2020-05-29 ENCOUNTER — Other Ambulatory Visit: Payer: Self-pay

## 2020-05-29 ENCOUNTER — Ambulatory Visit (INDEPENDENT_AMBULATORY_CARE_PROVIDER_SITE_OTHER): Payer: Medicare Other | Admitting: Family Medicine

## 2020-05-29 VITALS — BP 114/66 | HR 61 | Temp 98.6°F | Resp 16 | Ht 69.0 in | Wt 166.0 lb

## 2020-05-29 DIAGNOSIS — E782 Mixed hyperlipidemia: Secondary | ICD-10-CM | POA: Diagnosis not present

## 2020-05-29 DIAGNOSIS — E559 Vitamin D deficiency, unspecified: Secondary | ICD-10-CM

## 2020-05-29 DIAGNOSIS — R252 Cramp and spasm: Secondary | ICD-10-CM | POA: Diagnosis not present

## 2020-05-29 DIAGNOSIS — G629 Polyneuropathy, unspecified: Secondary | ICD-10-CM

## 2020-05-29 DIAGNOSIS — Z0000 Encounter for general adult medical examination without abnormal findings: Secondary | ICD-10-CM

## 2020-05-29 DIAGNOSIS — C8330 Diffuse large B-cell lymphoma, unspecified site: Secondary | ICD-10-CM

## 2020-05-29 DIAGNOSIS — D649 Anemia, unspecified: Secondary | ICD-10-CM | POA: Diagnosis not present

## 2020-05-29 DIAGNOSIS — R739 Hyperglycemia, unspecified: Secondary | ICD-10-CM | POA: Diagnosis not present

## 2020-05-29 LAB — LIPID PANEL
Cholesterol: 211 mg/dL — ABNORMAL HIGH (ref 0–200)
HDL: 67.7 mg/dL (ref >39.00)
LDL Cholesterol: 121 mg/dL — ABNORMAL HIGH (ref 0–99)
NonHDL: 143.26
Total CHOL/HDL Ratio: 3
Triglycerides: 109 mg/dL (ref 0.0–149.0)
VLDL: 21.8 mg/dL (ref 0.0–40.0)

## 2020-05-29 LAB — CBC WITH DIFFERENTIAL/PLATELET
Basophils Absolute: 0 10*3/uL (ref 0.0–0.1)
Basophils Relative: 0.8 % (ref 0.0–3.0)
Eosinophils Absolute: 0.1 10*3/uL (ref 0.0–0.7)
Eosinophils Relative: 1.6 % (ref 0.0–5.0)
HCT: 42.4 % (ref 36.0–46.0)
Hemoglobin: 14.6 g/dL (ref 12.0–15.0)
Lymphocytes Relative: 25.4 % (ref 12.0–46.0)
Lymphs Abs: 0.8 10*3/uL (ref 0.7–4.0)
MCHC: 34.6 g/dL (ref 30.0–36.0)
MCV: 90.3 fl (ref 78.0–100.0)
Monocytes Absolute: 0.4 10*3/uL (ref 0.1–1.0)
Monocytes Relative: 13.3 % — ABNORMAL HIGH (ref 3.0–12.0)
Neutro Abs: 1.9 10*3/uL (ref 1.4–7.7)
Neutrophils Relative %: 58.9 % (ref 43.0–77.0)
Platelets: 337 10*3/uL (ref 150.0–400.0)
RBC: 4.69 Mil/uL (ref 3.87–5.11)
RDW: 13 % (ref 11.5–15.5)
WBC: 3.3 10*3/uL — ABNORMAL LOW (ref 4.0–10.5)

## 2020-05-29 LAB — COMPREHENSIVE METABOLIC PANEL
ALT: 15 U/L (ref 0–35)
AST: 12 U/L (ref 0–37)
Albumin: 4.4 g/dL (ref 3.5–5.2)
Alkaline Phosphatase: 91 U/L (ref 39–117)
BUN: 15 mg/dL (ref 6–23)
CO2: 33 mEq/L — ABNORMAL HIGH (ref 19–32)
Calcium: 9.7 mg/dL (ref 8.4–10.5)
Chloride: 103 mEq/L (ref 96–112)
Creatinine, Ser: 0.67 mg/dL (ref 0.40–1.20)
GFR: 86.25 mL/min (ref >60.00)
Glucose, Bld: 80 mg/dL (ref 70–99)
Potassium: 4.6 mEq/L (ref 3.5–5.1)
Sodium: 140 mEq/L (ref 135–145)
Total Bilirubin: 0.7 mg/dL (ref 0.2–1.2)
Total Protein: 6.4 g/dL (ref 6.0–8.3)

## 2020-05-29 LAB — TSH: TSH: 1.55 u[IU]/mL (ref 0.35–4.50)

## 2020-05-29 LAB — HEMOGLOBIN A1C: Hgb A1c MFr Bld: 5.8 % (ref 4.6–6.5)

## 2020-05-29 LAB — MAGNESIUM: Magnesium: 2.1 mg/dL (ref 1.5–2.5)

## 2020-05-29 NOTE — Assessment & Plan Note (Addendum)
Patient encouraged to maintain heart healthy diet, regular exercise, adequate sleep. Consider daily probiotics. Take medications as prescribed. MGM in November 2021. repeat MGM this year. Dexa scan is due this year. She has decided to get the Dexa and MGM at [facility] later this year. Immunizations UTD including COVID booster

## 2020-05-29 NOTE — Assessment & Plan Note (Signed)
hgba1c acceptable, minimize simple carbs. Increase exercise as tolerated.  

## 2020-05-29 NOTE — Assessment & Plan Note (Signed)
Hydrate and monitor labs 

## 2020-05-29 NOTE — Progress Notes (Signed)
Subjective:    [name], female    DOB: Nov 30, 1945, 75 y.o.   MRN: [id number]  Chief Complaint  Patient presents with  . Annual Exam    HPI Patient is in today for annual prevenatative exam and follow up on chronic medical concerns. No recent febrile illness or hospitalizations. She continues to stay active with Ta[employer] and Yoga and walking despite her knee, right after she tore her meniscus. Is planning a trip to [place] in March. She is eating well and sleeping well. Denies CP/palp/SOB/HA/congestion/fevers/GI or GU c/o. Taking meds as prescribed  Past Medical History:  Diagnosis Date  . Anemia    h/o iron deficiency secondary to heavy menses  . Arthritis    left hand index finger  . 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 Grief reaction 01/27/2016  . H/O measles    as a child  . History of chicken pox    as a child  . History of PCOS   . Hyperlipidemia, mixed 01/27/2016  . Lymphadenopathy   . 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 Preventative health care 01/27/2016  . Vitamin D deficiency   . Wears glasses     Past Surgical History:  Procedure Laterality Date  . COLONOSCOPY    . IR GENERIC HISTORICAL  05/21/2016   IR FLUORO GUIDE PORT INSERTION RIGHT 05/21/2016 Arne Cleveland, MD [facility]  . IR GENERIC HISTORICAL  05/21/2016   IR US GUIDE VASC ACCESS RIGHT 05/21/2016 Arne Cleveland, MD [facility]  . IR REMOVAL TUN ACCESS W/ PORT W/O FL MOD SED  10/27/2016  . SKIN BIOPSY Left    face  . THYROGLOSSAL DUCT CYST N/A 04/16/2016   Procedure: THYROGLOSSAL DUCT CYST excision;  Surgeon: [name], MD;  Location: [facility] OR;  Service: ENT;  Laterality: N/A;    Family History  Problem Relation Age of Onset  . Arthritis Mother        rheumatoid  . Heart disease Father        CHF  . COPD Sister        Emphysema, h/o cigarettes  . Other Sister        pituatary tumor  . Arthritis Sister   . Obesity Sister   . Cancer Paternal Grandfather         cancer  . Stroke Maternal Aunt     Social History   Socioeconomic History  . Marital status: Widowed    Spouse name: Not on file  . Number of children: Not on file  . Years of education: Not on file  . Highest education level: Not on file  Occupational History  . Occupation: Psychotherapist  Tobacco Use  . Smoking status: Never Smoker  . Smokeless tobacco: Never Used  Vaping Use  . Vaping Use: Never used  Substance and Sexual Activity  . Alcohol use: Yes    Alcohol/week: 3.0 - 6.0 standard drinks    Types: 3 - 6 Standard drinks or equivalent per week    Comment: 5 glasses of wine a week.  . Drug use: No  . Sexual activity: Never    Partners: Male    Birth control/protection: None  Other Topics Concern  . Not on file  Social History Narrative   Married. Education: [employer]. Exercise: walking, yoga, and bicycling daily for 1-2 hours. Lives alone, works as Estate manager/land agent Strain: [place]   . Difficulty of Paying Living Expenses:  Not hard at all  Food Insecurity: No Food Insecurity  . Worried About Charity fundraiser in the Last Year: Never true  . Ran Out of Food in the Last Year: Never true  Transportation Needs: No Transportation Needs  . Lack of Transportation (Medical): No  . Lack of Transportation (Non-Medical): No  Physical Activity: Sufficiently Active  . Days of Exercise per Week: 7 days  . Minutes of Exercise per Session: 90 min  Stress: No Stress Concern Present  . Feeling of Stress : Not at all  Social Connections: Moderately Integrated  . Frequency of Communication with Friends and Family: More than three times a week  . Frequency of Social Gatherings with Friends and Family: More than three times a week  . Attends Religious Services: More than 4 times per year  . Active Member of Clubs or Organizations: Yes  . Attends Archivist Meetings: More than 4 times per year  . Marital Status:  Widowed  Intimate Partner Violence: Not At Risk  . Fear of Current or Ex-Partner: No  . Emotionally Abused: No  . Physically Abused: No  . Sexually Abused: No    Outpatient Medications Prior to Visit  Medication Sig Dispense Refill  . Calcium-Magnesium-Vitamin D (CALCIUM MAGNESIUM PO) Take by mouth.    . Cholecalciferol (VITAMIN D) 2000 units CAPS Take 2,000 Units by mouth daily.    [name] ELDERBERRY PO Take by mouth.    . Multiple Vitamins-Minerals (ZINC PO) Take by mouth.    . Omega-3 Fatty Acids (OMEGA-3 PO) Take 2 capsules by mouth daily. Omega 3 1280 mg    . amoxicillin (AMOXIL) 500 MG capsule Take 1 capsule (500 mg total) by mouth 3 (three) times daily. (Patient not taking: No sig reported) 30 capsule 0  . dexamethasone (DECADRON) 4 MG tablet TAKE 3 TABLETS BY MOUTH TWICE DAILY, TAKE 1 DOSE IN THE MORNING AND 1 DOSE IN THE EVENING THE DAY PRIOR TO Rituxan (Patient not taking: No sig reported) 12 tablet 6  . diphenoxylate-atropine (LOMOTIL) 2.5-0.025 MG tablet Take 1 tablet by mouth 4 (four) times daily as needed for diarrhea or loose stools. (Patient not taking: No sig reported) 30 tablet 0  . vitamin E 180 MG (400 UNITS) capsule Take 400 Units by mouth daily.     No facility-administered medications prior to visit.    Allergies  Allergen Reactions  . Erythromycin Other (See Comments)    Patient states that she passed out while using this medication ? SYNCOPE ?  [name] Other Anaphylaxis    # # # CATS # # #    Review of Systems  Constitutional: Negative for chills, fever and malaise/fatigue.  HENT: Negative for congestion and hearing loss.   Eyes: Negative for discharge.  Respiratory: Negative for cough, sputum production and shortness of breath.   Cardiovascular: Negative for chest pain, palpitations and leg swelling.  Gastrointestinal: Negative for abdominal pain, blood in stool, constipation, diarrhea, heartburn, nausea and vomiting.  Genitourinary: Negative for dysuria,  frequency, hematuria and urgency.  Musculoskeletal: Positive for joint pain. Negative for back pain, falls and myalgias.  Skin: Negative for rash.  Neurological: Negative for dizziness, sensory change, loss of consciousness, weakness and headaches.  Endo/Heme/Allergies: Negative for environmental allergies. Does not bruise/bleed easily.  Psychiatric/Behavioral: Negative for depression and suicidal ideas. The patient is not nervous/anxious and does not have insomnia.        Objective:    Physical Exam Constitutional:  General: She is not in acute distress.    Appearance: She is well-developed and well-nourished.  HENT:     Head: Normocephalic and atraumatic.  Eyes:     Conjunctiva/sclera: Conjunctivae normal.  Neck:     Thyroid: No thyromegaly.  Cardiovascular:     Rate and Rhythm: Normal rate and regular rhythm.     Heart sounds: Normal heart sounds. No murmur heard.   Pulmonary:     Effort: Pulmonary effort is normal. No respiratory distress.     Breath sounds: Normal breath sounds.  Abdominal:     General: Bowel sounds are normal. There is no distension.     Palpations: Abdomen is soft. There is no mass.     Tenderness: There is no abdominal tenderness.  Musculoskeletal:        General: No edema.     Cervical back: Neck supple.  Lymphadenopathy:     Cervical: No cervical adenopathy.  Skin:    General: Skin is warm and dry.  Neurological:     Mental Status: She is alert and oriented to person, place, and time.  Psychiatric:        Mood and Affect: Mood and affect normal.        Behavior: Behavior normal.     BP 114/66   Pulse 61   Temp 98.6 F (37 C) (Oral)   Resp 16   Ht 5' 9"  (1.753 m)   Wt 166 lb (75.3 kg)   SpO2 95%   BMI 24.51 kg/m  Wt Readings from Last 3 Encounters:  05/29/20 166 lb (75.3 kg)  04/10/20 173 lb 3.2 oz (78.6 kg)  04/04/20 174 lb 3.2 oz (79 kg)    Diabetic Foot Exam - Simple   No data filed    Lab Results  Component Value  Date   WBC 4.7 04/04/2020   HGB 13.6 04/04/2020   HCT 41.6 04/04/2020   PLT 294 04/04/2020   GLUCOSE 100 (H) 04/04/2020   CHOL 222 (H) 07/13/2018   TRIG 69.0 07/13/2018   HDL 85.20 07/13/2018   LDLCALC 123 (H) 07/13/2018   ALT 19 04/04/2020   AST 18 04/04/2020   NA 139 04/04/2020   K 3.9 04/04/2020   CL 105 04/04/2020   CREATININE 0.72 04/04/2020   BUN 17 04/04/2020   CO2 27 04/04/2020   TSH 1.23 07/13/2018   INR 1.00 10/27/2016   HGBA1C 5.3 03/08/2019    Lab Results  Component Value Date   TSH 1.23 07/13/2018   Lab Results  Component Value Date   WBC 4.7 04/04/2020   HGB 13.6 04/04/2020   HCT 41.6 04/04/2020   MCV 93.9 04/04/2020   PLT 294 04/04/2020   Lab Results  Component Value Date   NA 139 04/04/2020   K 3.9 04/04/2020   CHLORIDE 106 03/07/2017   CO2 27 04/04/2020   GLUCOSE 100 (H) 04/04/2020   BUN 17 04/04/2020   CREATININE 0.72 04/04/2020   BILITOT 0.4 04/04/2020   ALKPHOS 115 04/04/2020   AST 18 04/04/2020   ALT 19 04/04/2020   PROT 6.5 04/04/2020   ALBUMIN 3.8 04/04/2020   CALCIUM 9.3 04/04/2020   ANIONGAP 7 04/04/2020   EGFR >60 03/07/2017   GFR 89.52 07/13/2018   Lab Results  Component Value Date   CHOL 222 (H) 07/13/2018   Lab Results  Component Value Date   HDL 85.20 07/13/2018   Lab Results  Component Value Date   LDLCALC 123 (H) 07/13/2018  Lab Results  Component Value Date   TRIG 69.0 07/13/2018   Lab Results  Component Value Date   CHOLHDL 3 07/13/2018   Lab Results  Component Value Date   HGBA1C 5.3 03/08/2019       Assessment & Plan:   Problem List Items Addressed This Visit    Hyperlipidemia, mixed    Supplement and monitor      Relevant Orders   Lipid panel   Vitamin D deficiency - Primary    Supplement and monitor      Relevant Orders   Vitamin D 1,25 dihydroxy   Preventative health care    Patient encouraged to maintain heart healthy diet, regular exercise, adequate sleep. Consider daily  probiotics. Take medications as prescribed. MGM in November 2021. repeat MGM this year. Dexa scan is due this year. She has decided to get the Dexa and MGM at [facility] this year. Immunizations UTD including COVID booster      Anemia   Relevant Orders   CBC with Differential/Platelet   Hemoglobin A1c   NHL (non-Hodgkin's lymphoma) (HCC)   Neuropathy   Hyperglycemia    hgba1c acceptable, minimize simple carbs. Increase exercise as tolerated.       Relevant Orders   Comprehensive metabolic panel   Hemoglobin A1c   Muscle cramp    Hydrate and monitor labs      Relevant Orders   TSH   Magnesium      I am having [name] "Pat" maintain her Omega-3 Fatty Acids (OMEGA-3 PO), Vitamin D, dexamethasone, vitamin E, Calcium-Magnesium-Vitamin D (CALCIUM MAGNESIUM PO), ELDERBERRY PO, Multiple Vitamins-Minerals (ZINC PO), diphenoxylate-atropine, and amoxicillin.  No orders of the defined types were placed in this encounter.    Penni Homans, MD

## 2020-05-29 NOTE — Assessment & Plan Note (Signed)
Supplement and monitor 

## 2020-05-29 NOTE — Patient Instructions (Addendum)
You will need a new bone density and colonoscopy later this year.      Preventive Care 6 Years and Older, Female Preventive care refers to lifestyle choices and visits with your health care provider that can promote health and wellness. This includes:  A yearly physical exam. This is also called an annual wellness visit.  Regular dental and eye exams.  Immunizations.  Screening for certain conditions.  Healthy lifestyle choices, such as: ? Eating a healthy diet. ? Getting regular exercise. ? Not using drugs or products that contain nicotine and tobacco. ? Limiting alcohol use. What can I expect for my preventive care visit? Physical exam Your health care provider will check your:  Height and weight. These may be used to calculate your BMI (body mass index). BMI is a measurement that tells if you are at a healthy weight.  Heart rate and blood pressure.  Body temperature.  Skin for abnormal spots. Counseling Your health care provider may ask you questions about your:  Past medical problems.  Family's medical history.  Alcohol, tobacco, and drug use.  Emotional well-being.  Home life and relationship well-being.  Sexual activity.  Diet, exercise, and sleep habits.  History of falls.  Memory and ability to understand (cognition).  Work and work Statistician.  Pregnancy and menstrual history.  Access to firearms. What immunizations do I need? Vaccines are usually given at various ages, according to a schedule. Your health care provider will recommend vaccines for you based on your age, medical history, and lifestyle or other factors, such as travel or where you work.   What tests do I need? Blood tests  Lipid and cholesterol levels. These may be checked every 5 years, or more often depending on your overall health.  Hepatitis C test.  Hepatitis B test. Screening  Lung cancer screening. You may have this screening every year starting at age 34 if you  have a 30-pack-year history of smoking and currently smoke or have quit within the past 15 years.  Colorectal cancer screening. ? All adults should have this screening starting at age 57 and continuing until age 76. ? Your health care provider may recommend screening at age 27 if you are at increased risk. ? You will have tests every 1-10 years, depending on your results and the type of screening test.  Diabetes screening. ? This is done by checking your blood sugar (glucose) after you have not eaten for a while (fasting). ? You may have this done every 1-3 years.  Mammogram. ? This may be done every 1-2 years. ? Talk with your health care provider about how often you should have regular mammograms.  Abdominal aortic aneurysm (AAA) screening. You may need this if you are a current or former smoker.  BRCA-related cancer screening. This may be done if you have a family history of breast, ovarian, tubal, or peritoneal cancers. Other tests  STD (sexually transmitted disease) testing, if you are at risk.  Bone density scan. This is done to screen for osteoporosis. You may have this done starting at age 42. Talk with your health care provider about your test results, treatment options, and if necessary, the need for more tests. Follow these instructions at home: Eating and drinking  Eat a diet that includes fresh fruits and vegetables, whole grains, lean protein, and low-fat dairy products. Limit your intake of foods with high amounts of sugar, saturated fats, and salt.  Take vitamin and mineral supplements as recommended by your health care  provider.  Do not drink alcohol if your health care provider tells you not to drink.  If you drink alcohol: ? Limit how much you have to 0-1 drink a day. ? Be aware of how much alcohol is in your drink. In the [place] one drink equals one 12 oz bottle of beer (355 mL), one 5 oz glass of wine (148 mL), or one 1 oz glass of hard liquor (44 mL).    Lifestyle  Take daily care of your teeth and gums. Brush your teeth every morning and night with fluoride toothpaste. Floss one time each day.  Stay active. Exercise for at least 30 minutes 5 or more days each week.  Do not use any products that contain nicotine or tobacco, such as cigarettes, e-cigarettes, and chewing tobacco. If you need help quitting, ask your health care provider.  Do not use drugs.  If you are sexually active, practice safe sex. Use a condom or other form of protection in order to prevent STIs (sexually transmitted infections).  Talk with your health care provider about taking a low-dose aspirin or statin.  Find healthy ways to cope with stress, such as: ? Meditation, yoga, or listening to music. ? Journaling. ? Talking to a trusted person. ? Spending time with friends and family. Safety  Always wear your seat belt while driving or riding in a vehicle.  Do not drive: ? If you have been drinking alcohol. Do not ride with someone who has been drinking. ? When you are tired or distracted. ? While texting.  Wear a helmet and other protective equipment during sports activities.  If you have firearms in your house, make sure you follow all gun safety procedures. What's next?  Visit your health care provider once a year for an annual wellness visit.  Ask your health care provider how often you should have your eyes and teeth checked.  Stay up to date on all vaccines. This information is not intended to replace advice given to you by your health care provider. Make sure you discuss any questions you have with your health care provider. Document Revised: 04/09/2020 Document Reviewed: 04/13/2018 Elsevier Patient Education  2021 [employer].

## 2020-06-03 LAB — VITAMIN D 1,25 DIHYDROXY
Vitamin D 1, 25 (OH)2 Total: 51 pg/mL (ref 18–72)
Vitamin D2 1, 25 (OH)2: <8 pg/mL
Vitamin D3 1, 25 (OH)2: 51 pg/mL

## 2020-06-09 DIAGNOSIS — H2513 Age-related nuclear cataract, bilateral: Secondary | ICD-10-CM | POA: Diagnosis not present

## 2020-06-09 DIAGNOSIS — H52203 Unspecified astigmatism, bilateral: Secondary | ICD-10-CM | POA: Diagnosis not present

## 2020-06-09 DIAGNOSIS — H524 Presbyopia: Secondary | ICD-10-CM | POA: Diagnosis not present

## 2020-07-01 DIAGNOSIS — S83231S Complex tear of medial meniscus, current injury, right knee, sequela: Secondary | ICD-10-CM | POA: Diagnosis not present

## 2020-07-12 DIAGNOSIS — Z20822 Contact with and (suspected) exposure to covid-19: Secondary | ICD-10-CM | POA: Diagnosis not present

## 2020-08-06 ENCOUNTER — Other Ambulatory Visit: Payer: Medicare Other

## 2020-08-06 ENCOUNTER — Ambulatory Visit: Payer: Medicare Other | Admitting: Hematology

## 2020-08-13 ENCOUNTER — Other Ambulatory Visit: Payer: Self-pay

## 2020-08-13 DIAGNOSIS — C8330 Diffuse large B-cell lymphoma, unspecified site: Secondary | ICD-10-CM

## 2020-08-13 NOTE — Progress Notes (Signed)
[name]     HEMATOLOGY/ONCOLOGY CLINIC NOTE  Date of Service:  08/14/20     Patient Care Team: [name], MD as PCP - General (Family Medicine) [name] M.D. (ENT)  CHIEF COMPLAINTS/PURPOSE OF CONSULTATION:   F/u for follicular lymphoma/DLBCL  HISTORY OF PRESENTING ILLNESS:   Please see previous note for details of initial presentation.  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 She has completed 6 cycles of R-CHOP and 18 cycles of maintenance Rituxan. The patient's last visit with [place] was on 04/04/2020. The pt reports that she is doing well overall.  The pt reports no new symptoms or concerns. The pt notes she recently completed the [place] walk in [place] and [place] the last month.  Lab results today 08/14/2020 of CBC w/diff and CMP is as follows: all values are WNL except for Total Protein of 6.3, AST of 14. 08/14/2020 LDH of 209.  On review of systems, pt denies SOB, abdominal pain, back pain, leg swelling, and any other symptoms.  MEDICAL HISTORY:  Past Medical History:  Diagnosis Date  . Anemia    h/o iron deficiency secondary to heavy menses  . Arthritis    left hand index finger  . 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 Grief reaction 01/27/2016  . H/O measles    as a child  . History of chicken pox    as a child  . History of PCOS   . Hyperlipidemia, mixed 01/27/2016  . Lymphadenopathy   . 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 Preventative health care 01/27/2016  . Vitamin D deficiency   . Wears glasses     SURGICAL HISTORY: Past Surgical History:  Procedure Laterality Date  . COLONOSCOPY    . IR GENERIC HISTORICAL  05/21/2016   IR FLUORO GUIDE PORT INSERTION RIGHT 05/21/2016 Arne Cleveland, MD [facility]  . IR GENERIC HISTORICAL  05/21/2016   IR US GUIDE VASC ACCESS RIGHT 05/21/2016 Arne Cleveland, MD [facility]  . IR REMOVAL TUN ACCESS W/ PORT W/O FL  MOD SED  10/27/2016  . SKIN BIOPSY Left    face  . THYROGLOSSAL DUCT CYST N/A 04/16/2016   Procedure: THYROGLOSSAL DUCT CYST excision;  Surgeon: [name], MD;  Location: [place];  Service: ENT;  Laterality: N/A;    SOCIAL HISTORY: Social History   Socioeconomic History  . Marital status: Widowed    Spouse name: Not on file  . Number of children: Not on file  . Years of education: Not on file  . Highest education level: Not on file  Occupational History  . Occupation: Psychotherapist  Tobacco Use  . Smoking status: Never Smoker  . Smokeless tobacco: Never Used  Vaping Use  . Vaping Use: Never used  Substance and Sexual Activity  . Alcohol use: Yes    Alcohol/week: 3.0 - 6.0 standard drinks    Types: 3 - 6 Standard drinks or equivalent per week    Comment: 5 glasses of wine a week.  . Drug use: No  . Sexual activity: Never    Partners: Male    Birth control/protection: None  Other Topics Concern  . Not on file  Social History Narrative   Married. Education: [employer]. Exercise: walking, yoga, and bicycling daily for 1-2 hours. Lives alone, works as Estate manager/land agent Strain: [place]   . Difficulty of Paying Living  Expenses: Not hard at all  Food Insecurity: No Food Insecurity  . Worried About Charity fundraiser in the Last Year: Never true  . Ran Out of Food in the Last Year: Never true  Transportation Needs: No Transportation Needs  . Lack of Transportation (Medical): No  . Lack of Transportation (Non-Medical): No  Physical Activity: Sufficiently Active  . Days of Exercise per Week: 7 days  . Minutes of Exercise per Session: 90 min  Stress: No Stress Concern Present  . Feeling of Stress : Not at all  Social Connections: Moderately Integrated  . Frequency of Communication with Friends and Family: More than three times a week  . Frequency of Social Gatherings with Friends and Family: More than three times a week   . Attends Religious Services: More than 4 times per year  . Active Member of Clubs or Organizations: Yes  . Attends Archivist Meetings: More than 4 times per year  . Marital Status: Widowed  Intimate Partner Violence: Not At Risk  . Fear of Current or Ex-Partner: No  . Emotionally Abused: No  . Physically Abused: No  . Sexually Abused: No    FAMILY HISTORY: Family History  Problem Relation Age of Onset  . Arthritis Mother        rheumatoid  . Heart disease Father        CHF  . COPD Sister        Emphysema, h/o cigarettes  . Other Sister        pituatary tumor  . Arthritis Sister   . Obesity Sister   . Cancer Paternal Grandfather        cancer  . Stroke Maternal Aunt     ALLERGIES:  is allergic to erythromycin and other.  MEDICATIONS:  Current Outpatient Medications  Medication Sig Dispense Refill  . Calcium-Magnesium-Vitamin D (CALCIUM MAGNESIUM PO) Take by mouth.    . Cholecalciferol (VITAMIN D) 2000 units CAPS Take 2,000 Units by mouth daily.    [name] ELDERBERRY PO Take by mouth.    . Multiple Vitamins-Minerals (ZINC PO) Take by mouth.    . Omega-3 Fatty Acids (OMEGA-3 PO) Take 2 capsules by mouth daily. Omega 3 1280 mg    . vitamin E 180 MG (400 UNITS) capsule Take 400 Units by mouth daily.    [name] amoxicillin (AMOXIL) 500 MG capsule Take 1 capsule (500 mg total) by mouth 3 (three) times daily. 30 capsule 0  . dexamethasone (DECADRON) 4 MG tablet TAKE 3 TABLETS BY MOUTH TWICE DAILY, TAKE 1 DOSE IN THE MORNING AND 1 DOSE IN THE EVENING THE DAY PRIOR TO Rituxan 12 tablet 6  . diphenoxylate-atropine (LOMOTIL) 2.5-0.025 MG tablet Take 1 tablet by mouth 4 (four) times daily as needed for diarrhea or loose stools. (Patient not taking: No sig reported) 30 tablet 0   No current facility-administered medications for this visit.    REVIEW OF SYSTEMS:   10 Point review of Systems was done is negative except as noted above.  PHYSICAL EXAMINATION:  ECOG PERFORMANCE  STATUS: 0 - Asymptomatic  Vitals:   08/14/20 0927  BP: 124/78  Pulse: 63  Resp: 17  Temp: (!) 96.8 F (36 C)  SpO2: 99%   Filed Weights   08/14/20 0927  Weight: 163 lb 9.6 oz (74.2 kg)   .Body mass index is 24.16 kg/m.   NAD. GENERAL:alert, in no acute distress and comfortable SKIN: no acute rashes, no significant lesions EYES: conjunctiva are pink and  non-injected, sclera anicteric OROPHARYNX: MMM, no exudates, no oropharyngeal erythema or ulceration NECK: supple, no JVD LYMPH:  no palpable lymphadenopathy in the cervical, axillary or inguinal regions LUNGS: clear to auscultation b/l with normal respiratory effort HEART: regular rate & rhythm ABDOMEN:  normoactive bowel sounds , non tender, not distended. Extremity: no pedal edema PSYCH: alert & oriented x 3 with fluent speech NEURO: no focal motor/sensory deficits  LABORATORY DATA:  I have reviewed the data as listed  . CBC Latest Ref Rng & Units 08/14/2020 05/29/2020 04/04/2020  WBC 4.0 - 10.5 K/uL 4.2 3.3(L) 4.7  Hemoglobin 12.0 - 15.0 g/dL 14.0 14.6 13.6  Hematocrit 36.0 - 46.0 % 42.4 42.4 41.6  Platelets 150 - 400 K/uL 262 337.0 294   CBC    Component Value Date/Time   WBC 4.2 08/14/2020 0850   WBC 3.3 (L) 05/29/2020 0904   RBC 4.51 08/14/2020 0850   HGB 14.0 08/14/2020 0850   HGB 13.3 03/07/2017 0842   HCT 42.4 08/14/2020 0850   HCT 39.1 03/07/2017 0842   PLT 262 08/14/2020 0850   PLT 283 03/07/2017 0842   MCV 94.0 08/14/2020 0850   MCV 91.4 03/07/2017 0842   MCH 31.0 08/14/2020 0850   MCHC 33.0 08/14/2020 0850   RDW 13.2 08/14/2020 0850   RDW 13.3 03/07/2017 0842   LYMPHSABS 0.8 08/14/2020 0850   LYMPHSABS 0.4 (L) 03/07/2017 0842   MONOABS 0.4 08/14/2020 0850   MONOABS 0.7 03/07/2017 0842   EOSABS 0.1 08/14/2020 0850   EOSABS 0.0 03/07/2017 0842   BASOSABS 0.0 08/14/2020 0850   BASOSABS 0.0 03/07/2017 0842    . CMP Latest Ref Rng & Units 08/14/2020 05/29/2020 04/04/2020  Glucose 70 - 99 mg/dL  88 80 100(H)  BUN 8 - 23 mg/dL _0 Creatinine 0.44 - 1.00 mg/dL 0.71 0.67 0.72  Sodium 135 - 145 mmol/L 140 140 139  Potassium 3.5 - 5.1 mmol/L 4.2 4.6 3.9  Chloride 98 - 111 mmol/L 104 103 105  CO2 22 - 32 mmol/L 26 33(H) 27  Calcium 8.9 - 10.3 mg/dL 9.3 9.7 9.3  Total Protein 6.5 - 8.1 g/dL 6.3(L) 6.4 6.5  Total Bilirubin 0.3 - 1.2 mg/dL 0.5 0.7 0.4  Alkaline Phos 38 - 126 U/L 119 91 115  AST 15 - 41 U/L 14(L) 12 18  ALT 0 - 44 U/L _1 Component     Latest Ref Rng & Units 05/07/2016  Hepatitis B Surface Ag     Negative Negative  Hep B Core Ab, Tot     Negative Negative  Hep C Virus Ab     0.0 - 0.9 s/co ratio <0.1   . Lab Results  Component Value Date   LDH 209 (H) 08/14/2020         RADIOGRAPHIC STUDIES:   ASSESSMENT & PLAN:   75 y.o. 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  Initial PET/CT showed Scattered small mildly hypermetabolic lymph nodes within the neck, left hilum, left axilla and left groin, potentially related the patient's lymphoma. Focal hypermetabolic activity within the left sternal manubrium and left iliac bone, also potentially related to the patient's lymphoma. No evidence of solid visceral organ involvement.  CT guided bone marrow biopsy done -- no evidence of lymphoma involving Bone marrow. Normal LDH. No constitutional symptoms. Patient has no significant medical comorbidities at baseline. ECHO  with nl EF as expected.  Patient has completed 6 cycles of R CHOP PET/CT s/p 6 cycles of R-CHOP shows complete metabolic response CT chest/abd/pelvis 07/07/2017 - showed no radipgraphic evidence of lymphoma progression at this time.  Lab Results  Component Value Date   LDH 209 (H) 08/14/2020   11/06/2018 CT C/A/P with results revealing "1. No evidence of recurrent lymphoma. 2.  Aortic atherosclerosis  (ICD10-170.0)." 11/19/2019 CT C/A/P ([id number]) ([id number]) revealed "No evidence of disease recurrence."   2) Rituxan hypersensitivity (mild grade 1 hives on the back ) - resolved with solumedrol and antihistamines. No issues with dexamethasone pretreatment -Patient will continue dexamethasone premedication to reduce risk of Rituxan hypersensitivity.  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 LDH stable. -Recommended pt receive the second COVID booster as recently approved by the CDC. -Advised pt we can now space out visits due to stability in labs and clinical examination. -No lab or clinical evidence of FL recurrence at this time. Will continue watchful observation. -Will see back in 6 months with labs.   FOLLOW UP: RTC with Dr Irene Limbo with labs in 6 months   The total time spent in the appt was 30 minutes and more than 50% was on counseling and direct patient cares.  All of the patient's questions were answered with apparent satisfaction. The patient knows to call the clinic with any problems, questions or concerns.   Sullivan Lone MD [place] AAHIVMS [facility] Hematology/Oncology Physician [facility]  (Office):       [phone] (Work cell):  [phone] (Fax):           [phone]  I, Reinaldo Raddle, am acting as scribe for Dr. Sullivan Lone, MD.     .I have reviewed the above documentation for accuracy and completeness, and I agree with the above. Brunetta Genera MD

## 2020-08-14 ENCOUNTER — Inpatient Hospital Stay (HOSPITAL_BASED_OUTPATIENT_CLINIC_OR_DEPARTMENT_OTHER): Payer: Medicare Other | Admitting: Hematology

## 2020-08-14 ENCOUNTER — Inpatient Hospital Stay: Payer: Medicare Other | Attending: Hematology

## 2020-08-14 ENCOUNTER — Other Ambulatory Visit: Payer: Self-pay

## 2020-08-14 ENCOUNTER — Telehealth: Payer: Self-pay | Admitting: Hematology

## 2020-08-14 VITALS — BP 124/78 | HR 63 | Temp 96.8°F | Resp 17 | Ht 69.0 in | Wt 163.6 lb

## 2020-08-14 DIAGNOSIS — Z809 Family history of malignant neoplasm, unspecified: Secondary | ICD-10-CM | POA: Diagnosis not present

## 2020-08-14 DIAGNOSIS — Z823 Family history of stroke: Secondary | ICD-10-CM | POA: Insufficient documentation

## 2020-08-14 DIAGNOSIS — Z79899 Other long term (current) drug therapy: Secondary | ICD-10-CM | POA: Diagnosis not present

## 2020-08-14 DIAGNOSIS — C8330 Diffuse large B-cell lymphoma, unspecified site: Secondary | ICD-10-CM

## 2020-08-14 DIAGNOSIS — C8338 Diffuse large B-cell lymphoma, lymph nodes of multiple sites: Secondary | ICD-10-CM | POA: Insufficient documentation

## 2020-08-14 DIAGNOSIS — I700 Atherosclerosis of aorta: Secondary | ICD-10-CM | POA: Insufficient documentation

## 2020-08-14 DIAGNOSIS — Z8249 Family history of ischemic heart disease and other diseases of the circulatory system: Secondary | ICD-10-CM | POA: Insufficient documentation

## 2020-08-14 DIAGNOSIS — G620 Drug-induced polyneuropathy: Secondary | ICD-10-CM | POA: Diagnosis not present

## 2020-08-14 DIAGNOSIS — Z836 Family history of other diseases of the respiratory system: Secondary | ICD-10-CM | POA: Insufficient documentation

## 2020-08-14 DIAGNOSIS — C8298 Follicular lymphoma, unspecified, lymph nodes of multiple sites: Secondary | ICD-10-CM

## 2020-08-14 DIAGNOSIS — E785 Hyperlipidemia, unspecified: Secondary | ICD-10-CM | POA: Insufficient documentation

## 2020-08-14 DIAGNOSIS — T451X5A Adverse effect of antineoplastic and immunosuppressive drugs, initial encounter: Secondary | ICD-10-CM | POA: Insufficient documentation

## 2020-08-14 DIAGNOSIS — Z8349 Family history of other endocrine, nutritional and metabolic diseases: Secondary | ICD-10-CM | POA: Diagnosis not present

## 2020-08-14 DIAGNOSIS — Z8261 Family history of arthritis: Secondary | ICD-10-CM | POA: Insufficient documentation

## 2020-08-14 DIAGNOSIS — Z7289 Other problems related to lifestyle: Secondary | ICD-10-CM | POA: Diagnosis not present

## 2020-08-14 DIAGNOSIS — E559 Vitamin D deficiency, unspecified: Secondary | ICD-10-CM | POA: Insufficient documentation

## 2020-08-14 LAB — CBC WITH DIFFERENTIAL (CANCER CENTER ONLY)
Abs Immature Granulocytes: 0.01 10*3/uL (ref 0.00–0.07)
Basophils Absolute: 0 10*3/uL (ref 0.0–0.1)
Basophils Relative: 1 %
Eosinophils Absolute: 0.1 10*3/uL (ref 0.0–0.5)
Eosinophils Relative: 3 %
HCT: 42.4 % (ref 36.0–46.0)
Hemoglobin: 14 g/dL (ref 12.0–15.0)
Immature Granulocytes: 0 %
Lymphocytes Relative: 19 %
Lymphs Abs: 0.8 10*3/uL (ref 0.7–4.0)
MCH: 31 pg (ref 26.0–34.0)
MCHC: 33 g/dL (ref 30.0–36.0)
MCV: 94 fL (ref 80.0–100.0)
Monocytes Absolute: 0.4 10*3/uL (ref 0.1–1.0)
Monocytes Relative: 9 %
Neutro Abs: 2.8 10*3/uL (ref 1.7–7.7)
Neutrophils Relative %: 68 %
Platelet Count: 262 10*3/uL (ref 150–400)
RBC: 4.51 MIL/uL (ref 3.87–5.11)
RDW: 13.2 % (ref 11.5–15.5)
WBC Count: 4.2 10*3/uL (ref 4.0–10.5)
nRBC: 0 % (ref 0.0–0.2)

## 2020-08-14 LAB — CMP (CANCER CENTER ONLY)
ALT: 17 U/L (ref 0–44)
AST: 14 U/L — ABNORMAL LOW (ref 15–41)
Albumin: 3.9 g/dL (ref 3.5–5.0)
Alkaline Phosphatase: 119 U/L (ref 38–126)
Anion gap: 10 (ref 5–15)
BUN: 13 mg/dL (ref 8–23)
CO2: 26 mmol/L (ref 22–32)
Calcium: 9.3 mg/dL (ref 8.9–10.3)
Chloride: 104 mmol/L (ref 98–111)
Creatinine: 0.71 mg/dL (ref 0.44–1.00)
GFR, Estimated: >60 mL/min (ref >60)
Glucose, Bld: 88 mg/dL (ref 70–99)
Potassium: 4.2 mmol/L (ref 3.5–5.1)
Sodium: 140 mmol/L (ref 135–145)
Total Bilirubin: 0.5 mg/dL (ref 0.3–1.2)
Total Protein: 6.3 g/dL — ABNORMAL LOW (ref 6.5–8.1)

## 2020-08-14 LAB — LACTATE DEHYDROGENASE: LDH: 209 U/L — ABNORMAL HIGH (ref 98–192)

## 2020-08-14 NOTE — Telephone Encounter (Signed)
Scheduled per los. Confirmed appt. Declined printout  

## 2020-11-27 ENCOUNTER — Ambulatory Visit: Payer: Medicare Other | Admitting: Family Medicine

## 2020-12-03 DIAGNOSIS — Z20822 Contact with and (suspected) exposure to covid-19: Secondary | ICD-10-CM | POA: Diagnosis not present

## 2020-12-08 DIAGNOSIS — L820 Inflamed seborrheic keratosis: Secondary | ICD-10-CM | POA: Diagnosis not present

## 2020-12-23 ENCOUNTER — Ambulatory Visit: Payer: Medicare Other | Admitting: Family Medicine

## 2021-01-12 ENCOUNTER — Encounter: Payer: Self-pay | Admitting: Family Medicine

## 2021-01-12 ENCOUNTER — Other Ambulatory Visit: Payer: Self-pay | Admitting: *Deleted

## 2021-01-12 DIAGNOSIS — E2839 Other primary ovarian failure: Secondary | ICD-10-CM

## 2021-01-12 DIAGNOSIS — M8580 Other specified disorders of bone density and structure, unspecified site: Secondary | ICD-10-CM

## 2021-01-16 ENCOUNTER — Telehealth (HOSPITAL_BASED_OUTPATIENT_CLINIC_OR_DEPARTMENT_OTHER): Payer: Self-pay

## 2021-01-30 ENCOUNTER — Telehealth: Payer: Self-pay | Admitting: Family Medicine

## 2021-01-30 DIAGNOSIS — Z201 Contact with and (suspected) exposure to tuberculosis: Secondary | ICD-10-CM

## 2021-01-30 NOTE — Telephone Encounter (Signed)
Patient is calling because yesterday 09/29, she was with a friend who's sister got diagnosed with tuberculosis. She said her friend had been with her sister a week prior for a few days, and at the time they did now know she had TB. The patient states she was only with her friend for an hour and they shared a dessert, but she is still concerned about the contact due to her [place] disease. She wants to know if she can be tested for Tb, or what to do. Please advice.

## 2021-02-02 NOTE — Telephone Encounter (Signed)
Patient will call back to schedule lab appointment, she was driving at the time.  Future order placed.

## 2021-02-04 ENCOUNTER — Other Ambulatory Visit: Payer: Medicare Other

## 2021-02-05 ENCOUNTER — Telehealth: Payer: Self-pay | Admitting: Family Medicine

## 2021-02-05 ENCOUNTER — Other Ambulatory Visit (INDEPENDENT_AMBULATORY_CARE_PROVIDER_SITE_OTHER): Payer: Medicare Other

## 2021-02-05 ENCOUNTER — Other Ambulatory Visit: Payer: Self-pay

## 2021-02-05 DIAGNOSIS — Z201 Contact with and (suspected) exposure to tuberculosis: Secondary | ICD-10-CM | POA: Diagnosis not present

## 2021-02-05 NOTE — Telephone Encounter (Signed)
Patient would like to update immunizations for Covid & Flu shot vaccination.  Flu shot- 10.04.2022 Covid booster- 9.16.2022

## 2021-02-05 NOTE — Telephone Encounter (Signed)
Immunization record updated.

## 2021-02-08 LAB — QUANTIFERON-TB GOLD PLUS
Mitogen-NIL: 7.83 IU/mL
NIL: 0.04 IU/mL
QuantiFERON-TB Gold Plus: NEGATIVE
TB1-NIL: 0.03 IU/mL
TB2-NIL: 0.07 IU/mL

## 2021-02-10 ENCOUNTER — Other Ambulatory Visit: Payer: Self-pay

## 2021-02-10 DIAGNOSIS — C8298 Follicular lymphoma, unspecified, lymph nodes of multiple sites: Secondary | ICD-10-CM

## 2021-02-11 ENCOUNTER — Other Ambulatory Visit: Payer: Self-pay

## 2021-02-11 ENCOUNTER — Inpatient Hospital Stay: Payer: Medicare Other | Attending: Hematology | Admitting: Hematology

## 2021-02-11 ENCOUNTER — Inpatient Hospital Stay: Payer: Medicare Other

## 2021-02-11 VITALS — BP 126/74 | HR 65 | Temp 98.4°F | Resp 18 | Ht 69.0 in | Wt 167.4 lb

## 2021-02-11 DIAGNOSIS — Z79899 Other long term (current) drug therapy: Secondary | ICD-10-CM | POA: Insufficient documentation

## 2021-02-11 DIAGNOSIS — C8330 Diffuse large B-cell lymphoma, unspecified site: Secondary | ICD-10-CM | POA: Insufficient documentation

## 2021-02-11 DIAGNOSIS — C8298 Follicular lymphoma, unspecified, lymph nodes of multiple sites: Secondary | ICD-10-CM

## 2021-02-11 LAB — CBC WITH DIFFERENTIAL (CANCER CENTER ONLY)
Abs Immature Granulocytes: 0.01 10*3/uL (ref 0.00–0.07)
Basophils Absolute: 0 10*3/uL (ref 0.0–0.1)
Basophils Relative: 1 %
Eosinophils Absolute: 0.1 10*3/uL (ref 0.0–0.5)
Eosinophils Relative: 2 %
HCT: 41.2 % (ref 36.0–46.0)
Hemoglobin: 14 g/dL (ref 12.0–15.0)
Immature Granulocytes: 0 %
Lymphocytes Relative: 23 %
Lymphs Abs: 1.3 10*3/uL (ref 0.7–4.0)
MCH: 30.8 pg (ref 26.0–34.0)
MCHC: 34 g/dL (ref 30.0–36.0)
MCV: 90.7 fL (ref 80.0–100.0)
Monocytes Absolute: 0.5 10*3/uL (ref 0.1–1.0)
Monocytes Relative: 8 %
Neutro Abs: 3.8 10*3/uL (ref 1.7–7.7)
Neutrophils Relative %: 66 %
Platelet Count: 308 10*3/uL (ref 150–400)
RBC: 4.54 MIL/uL (ref 3.87–5.11)
RDW: 12.4 % (ref 11.5–15.5)
WBC Count: 5.7 10*3/uL (ref 4.0–10.5)
nRBC: 0 % (ref 0.0–0.2)

## 2021-02-11 LAB — CMP (CANCER CENTER ONLY)
ALT: 18 U/L (ref 0–44)
AST: 18 U/L (ref 15–41)
Albumin: 4.2 g/dL (ref 3.5–5.0)
Alkaline Phosphatase: 95 U/L (ref 38–126)
Anion gap: 8 (ref 5–15)
BUN: 16 mg/dL (ref 8–23)
CO2: 26 mmol/L (ref 22–32)
Calcium: 9.9 mg/dL (ref 8.9–10.3)
Chloride: 110 mmol/L (ref 98–111)
Creatinine: 0.72 mg/dL (ref 0.44–1.00)
GFR, Estimated: >60 mL/min (ref >60)
Glucose, Bld: 108 mg/dL — ABNORMAL HIGH (ref 70–99)
Potassium: 4.2 mmol/L (ref 3.5–5.1)
Sodium: 144 mmol/L (ref 135–145)
Total Bilirubin: 0.7 mg/dL (ref 0.3–1.2)
Total Protein: 6.8 g/dL (ref 6.5–8.1)

## 2021-02-11 LAB — LACTATE DEHYDROGENASE: LDH: 166 U/L (ref 98–192)

## 2021-02-17 ENCOUNTER — Encounter: Payer: Self-pay | Admitting: Hematology

## 2021-02-17 NOTE — Progress Notes (Signed)
[name]     HEMATOLOGY/ONCOLOGY CLINIC NOTE  Date of Service:  .02/11/2021   Patient Care Team: [name], MD as PCP - General (Family Medicine) [name] M.D. (ENT)  CHIEF COMPLAINTS/PURPOSE OF CONSULTATION:   F/u for follicular lymphoma/DLBCL  HISTORY OF PRESENTING ILLNESS:   Please see previous note for details of initial presentation.  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 The pt reports that she is doing well overall.  Patient notes no acute new symptoms.  No fevers no chills no night sweats no unexpected weight loss no new lumps or bumps. 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   Grief reaction 01/27/2016   H/O measles    as a child   History of chicken pox    as a child   History of PCOS    Hyperlipidemia, mixed 01/27/2016   Lymphadenopathy    Peripheral neuropathy 01/31/2017   Preventative health care 01/27/2016   Vitamin D deficiency    Wears glasses     SURGICAL HISTORY: Past Surgical History:  Procedure Laterality Date   COLONOSCOPY     IR GENERIC HISTORICAL  05/21/2016   IR FLUORO GUIDE PORT INSERTION RIGHT 05/21/2016 Arne Cleveland, MD [facility]   IR GENERIC HISTORICAL  05/21/2016   IR US GUIDE VASC ACCESS RIGHT 05/21/2016 Arne Cleveland, MD [facility]   IR REMOVAL TUN ACCESS W/ PORT W/O FL MOD SED  10/27/2016   SKIN BIOPSY Left    face   THYROGLOSSAL DUCT CYST N/A 04/16/2016   Procedure: THYROGLOSSAL DUCT CYST excision;  Surgeon: [name], MD;  Location: [place];  Service: ENT;  Laterality: N/A;    SOCIAL HISTORY: Social History   Socioeconomic History    Marital status: Widowed    Spouse name: Not on file   Number of children: Not on file   Years of education: Not on file   Highest education level: Not on file  Occupational History   Occupation: Psychotherapist  Tobacco Use   Smoking status: Never   Smokeless tobacco: Never  Vaping Use   Vaping Use: Never used  Substance and Sexual Activity   Alcohol use: Yes    Alcohol/week: 3.0 - 6.0 standard drinks    Types: 3 - 6 Standard drinks or equivalent per week    Comment: 5 glasses of wine a week.   Drug use: No   Sexual activity: Never    Partners: Male    Birth control/protection: None  Other Topics Concern   Not on file  Social History Narrative   Married. Education: [employer]. Exercise: walking, yoga, and bicycling daily for 1-2 hours. Lives alone, works as Estate manager/land agent Strain: Low Risk    Difficulty of Paying Living Expenses: Not hard at all  Food Insecurity: No Food Insecurity   Worried About Charity fundraiser in the Last Year: Never true   Arboriculturist in the Last Year: Never true  Transportation Needs: No Transportation Needs   Lack of Transportation (Medical): No   Lack of  Transportation (Non-Medical): No  Physical Activity: Sufficiently Active   Days of Exercise per Week: 7 days   Minutes of Exercise per Session: 90 min  Stress: No Stress Concern Present   Feeling of Stress : Not at all  Social Connections: Moderately Integrated   Frequency of Communication with Friends and Family: More than three times a week   Frequency of Social Gatherings with Friends and Family: More than three times a week   Attends Religious Services: More than 4 times per year   Active Member of [employer] or Organizations: Yes   Attends Archivist Meetings: More than 4 times per year   Marital Status: Widowed  Human resources officer Violence: Not At Risk   Fear of Current or Ex-Partner: No   Emotionally Abused: No   Physically  Abused: No   Sexually Abused: No    FAMILY HISTORY: Family History  Problem Relation Age of Onset   Arthritis Mother        rheumatoid   Heart disease Father        CHF   COPD Sister        Emphysema, h/o cigarettes   Other Sister        pituatary tumor   Arthritis Sister    Obesity Sister    Cancer Paternal Grandfather        cancer   Stroke Maternal Aunt     ALLERGIES:  is allergic to erythromycin and other.  MEDICATIONS:  Current Outpatient Medications  Medication Sig Dispense Refill   Calcium-Magnesium-Vitamin D (CALCIUM MAGNESIUM PO) Take by mouth.     Cholecalciferol (VITAMIN D) 2000 units CAPS Take 2,000 Units by mouth daily.     ELDERBERRY PO Take by mouth.     Multiple Vitamins-Minerals (ZINC PO) Take by mouth.     Omega-3 Fatty Acids (OMEGA-3 PO) Take 2 capsules by mouth daily. Omega 3 1280 mg     vitamin E 180 MG (400 UNITS) capsule Take 400 Units by mouth daily.     amoxicillin (AMOXIL) 500 MG capsule Take 1 capsule (500 mg total) by mouth 3 (three) times daily. (Patient not taking: Reported on 02/11/2021) 30 capsule 0   dexamethasone (DECADRON) 4 MG tablet TAKE 3 TABLETS BY MOUTH TWICE DAILY, TAKE 1 DOSE IN THE MORNING AND 1 DOSE IN THE EVENING THE DAY PRIOR TO Rituxan (Patient not taking: Reported on 02/11/2021) 12 tablet 6   diphenoxylate-atropine (LOMOTIL) 2.5-0.025 MG tablet Take 1 tablet by mouth 4 (four) times daily as needed for diarrhea or loose stools. (Patient not taking: No sig reported) 30 tablet 0   No current facility-administered medications for this visit.    REVIEW OF SYSTEMS:   .10 Point review of Systems was done is negative except as noted above.   PHYSICAL EXAMINATION:  ECOG PERFORMANCE STATUS: 0 - Asymptomatic  Vitals:   02/11/21 0919  BP: 126/74  Pulse: 65  Resp: 18  Temp: 98.4 F (36.9 C)  SpO2: 98%   Filed Weights   02/11/21 0919  Weight: 167 lb 6.4 oz (75.9 kg)   .Body mass index is 24.72 kg/m.   GENERAL:alert, in  no acute distress and comfortable SKIN: no acute rashes, no significant lesions EYES: conjunctiva are pink and non-injected, sclera anicteric OROPHARYNX: MMM, no exudates, no oropharyngeal erythema or ulceration NECK: supple, no JVD LYMPH:  no palpable lymphadenopathy in the cervical, axillary or inguinal regions LUNGS: clear to auscultation b/l with normal respiratory effort HEART: regular rate & rhythm ABDOMEN:  normoactive bowel sounds , non tender, not distended. Extremity: no pedal edema PSYCH: alert & oriented x 3 with fluent speech NEURO: no focal motor/sensory deficits   LABORATORY DATA:  I have reviewed the data as listed  . CBC Latest Ref Rng & Units 02/11/2021 08/14/2020 05/29/2020  WBC 4.0 - 10.5 K/uL 5.7 4.2 3.3(L)  Hemoglobin 12.0 - 15.0 g/dL 14.0 14.0 14.6  Hematocrit 36.0 - 46.0 % 41.2 42.4 42.4  Platelets 150 - 400 K/uL 308 262 337.0   CBC    Component Value Date/Time   WBC 5.7 02/11/2021 0904   WBC 3.3 (L) 05/29/2020 0904   RBC 4.54 02/11/2021 0904   HGB 14.0 02/11/2021 0904   HGB 13.3 03/07/2017 0842   HCT 41.2 02/11/2021 0904   HCT 39.1 03/07/2017 0842   PLT 308 02/11/2021 0904   PLT 283 03/07/2017 0842   MCV 90.7 02/11/2021 0904   MCV 91.4 03/07/2017 0842   MCH 30.8 02/11/2021 0904   MCHC 34.0 02/11/2021 0904   RDW 12.4 02/11/2021 0904   RDW 13.3 03/07/2017 0842   LYMPHSABS 1.3 02/11/2021 0904   LYMPHSABS 0.4 (L) 03/07/2017 0842   MONOABS 0.5 02/11/2021 0904   MONOABS 0.7 03/07/2017 0842   EOSABS 0.1 02/11/2021 0904   EOSABS 0.0 03/07/2017 0842   BASOSABS 0.0 02/11/2021 0904   BASOSABS 0.0 03/07/2017 0842    . CMP Latest Ref Rng & Units 02/11/2021 08/14/2020 05/29/2020  Glucose 70 - 99 mg/dL 108(H) 88 80  BUN 8 - 23 mg/dL _0 Creatinine 0.44 - 1.00 mg/dL 0.72 0.71 0.67  Sodium 135 - 145 mmol/L 144 140 140  Potassium 3.5 - 5.1 mmol/L 4.2 4.2 4.6  Chloride 98 - 111 mmol/L 110 104 103  CO2 22 - 32 mmol/L 26 26 33(H)  Calcium 8.9 - 10.3  mg/dL 9.9 9.3 9.7  Total Protein 6.5 - 8.1 g/dL 6.8 6.3(L) 6.4  Total Bilirubin 0.3 - 1.2 mg/dL 0.7 0.5 0.7  Alkaline Phos 38 - 126 U/L 95 119 91  AST 15 - 41 U/L 18 14(L) 12  ALT 0 - 44 U/L _1 Component     Latest Ref Rng & Units 05/07/2016  Hepatitis B Surface Ag     Negative Negative  Hep B Core Ab, Tot     Negative Negative  Hep C Virus Ab     0.0 - 0.9 s/co ratio <0.1  .  Lab Results  Component Value Date   LDH 166 02/11/2021         RADIOGRAPHIC STUDIES:   ASSESSMENT & PLAN:   75 y.o. 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  Initial PET/CT showed Scattered small mildly hypermetabolic lymph nodes within the neck, left hilum, left axilla and left groin, potentially related the patient's lymphoma. Focal hypermetabolic activity within the left sternal manubrium and left iliac bone, also potentially related to the patient's lymphoma. No evidence of solid visceral organ involvement.  CT guided bone marrow biopsy done -- no evidence of lymphoma involving Bone marrow. Normal LDH. No constitutional symptoms. Patient has no significant medical comorbidities at baseline. ECHO with nl EF as expected.  Patient has completed 6 cycles of R CHOP PET/CT s/p 6 cycles of R-CHOP shows complete metabolic response CT chest/abd/pelvis 07/07/2017 - showed no radipgraphic evidence of lymphoma progression at this time.  Lab Results  Component Value  Date   LDH 166 02/11/2021   11/06/2018 CT C/A/P with results revealing "1. No evidence of recurrent lymphoma. 2.  Aortic atherosclerosis (ICD10-170.0)." 11/19/2019 CT C/A/P ([id number]) ([id number]) revealed "No evidence of disease recurrence."   2) Rituxan hypersensitivity (mild grade 1 hives on the back ) - resolved with solumedrol and antihistamines. No issues with dexamethasone pretreatment -Patient will continue  dexamethasone premedication to reduce risk of Rituxan hypersensitivity.  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 Will continue watchful observation. -Will see back in 6 months with labs. -Recommended patient get the new COVID-19 booster vaccine and her yearly flu shot  FOLLOW UP: RTC with Dr Irene Limbo with labs in 6 months  . The total time spent in the appointment was 20 minutes and more than 50% was on counseling and direct patient cares.   All of the patient's questions were answered with apparent satisfaction. The patient knows to call the clinic with any problems, questions or concerns.   Sullivan Lone MD [place] AAHIVMS [facility] Hematology/Oncology Physician [facility]

## 2021-03-03 DIAGNOSIS — D225 Melanocytic nevi of trunk: Secondary | ICD-10-CM | POA: Diagnosis not present

## 2021-03-03 DIAGNOSIS — L814 Other melanin hyperpigmentation: Secondary | ICD-10-CM | POA: Diagnosis not present

## 2021-03-03 DIAGNOSIS — Z86018 Personal history of other benign neoplasm: Secondary | ICD-10-CM | POA: Diagnosis not present

## 2021-03-03 DIAGNOSIS — Z808 Family history of malignant neoplasm of other organs or systems: Secondary | ICD-10-CM | POA: Diagnosis not present

## 2021-03-03 DIAGNOSIS — L821 Other seborrheic keratosis: Secondary | ICD-10-CM | POA: Diagnosis not present

## 2021-03-03 DIAGNOSIS — Z23 Encounter for immunization: Secondary | ICD-10-CM | POA: Diagnosis not present

## 2021-03-13 DIAGNOSIS — Z1231 Encounter for screening mammogram for malignant neoplasm of breast: Secondary | ICD-10-CM | POA: Diagnosis not present

## 2021-03-19 ENCOUNTER — Other Ambulatory Visit (HOSPITAL_BASED_OUTPATIENT_CLINIC_OR_DEPARTMENT_OTHER): Payer: Self-pay | Admitting: Family Medicine

## 2021-03-19 DIAGNOSIS — M8580 Other specified disorders of bone density and structure, unspecified site: Secondary | ICD-10-CM

## 2021-03-19 DIAGNOSIS — E2839 Other primary ovarian failure: Secondary | ICD-10-CM

## 2021-04-07 ENCOUNTER — Encounter: Payer: Self-pay | Admitting: Family Medicine

## 2021-04-07 ENCOUNTER — Ambulatory Visit (HOSPITAL_BASED_OUTPATIENT_CLINIC_OR_DEPARTMENT_OTHER)
Admission: RE | Admit: 2021-04-07 | Discharge: 2021-04-07 | Disposition: A | Discharge status: Home / Self Care | Payer: Medicare Other | Source: Ambulatory Visit | Attending: Family Medicine | Admitting: Family Medicine

## 2021-04-07 ENCOUNTER — Other Ambulatory Visit: Payer: Self-pay

## 2021-04-07 ENCOUNTER — Ambulatory Visit (INDEPENDENT_AMBULATORY_CARE_PROVIDER_SITE_OTHER): Payer: Medicare Other | Admitting: Family Medicine

## 2021-04-07 VITALS — BP 128/76 | HR 64 | Temp 98.2°F | Resp 16 | Wt 166.8 lb

## 2021-04-07 DIAGNOSIS — M8588 Other specified disorders of bone density and structure, other site: Secondary | ICD-10-CM | POA: Diagnosis not present

## 2021-04-07 DIAGNOSIS — M8580 Other specified disorders of bone density and structure, unspecified site: Secondary | ICD-10-CM | POA: Diagnosis not present

## 2021-04-07 DIAGNOSIS — F4321 Adjustment disorder with depressed mood: Secondary | ICD-10-CM | POA: Diagnosis not present

## 2021-04-07 DIAGNOSIS — E559 Vitamin D deficiency, unspecified: Secondary | ICD-10-CM

## 2021-04-07 DIAGNOSIS — E2839 Other primary ovarian failure: Secondary | ICD-10-CM | POA: Insufficient documentation

## 2021-04-07 DIAGNOSIS — Z1211 Encounter for screening for malignant neoplasm of colon: Secondary | ICD-10-CM | POA: Diagnosis not present

## 2021-04-07 DIAGNOSIS — E782 Mixed hyperlipidemia: Secondary | ICD-10-CM

## 2021-04-07 DIAGNOSIS — R739 Hyperglycemia, unspecified: Secondary | ICD-10-CM

## 2021-04-07 DIAGNOSIS — C8330 Diffuse large B-cell lymphoma, unspecified site: Secondary | ICD-10-CM | POA: Diagnosis not present

## 2021-04-07 NOTE — Patient Instructions (Addendum)
Try Voltaren, Biofreeze and Lidocaine twice a day after icing  Bone density shows osteopenia, which is thinner than normal but not as bad as osteoporosis. Recommend calcium intake of 1200 to 1500 mg daily, divided into roughly 3 doses. Best source is the diet and a single dairy serving is about 500 mg, a supplement of calcium citrate once or twice daily to balance diet is fine if not getting enough in diet. Also need Vitamin D 2000 IU caps, 1 cap daily if not already taking vitamin D. Also recommend weight baring exercise on hips and upper body to keep bones strong  Osteopenia Osteopenia is a loss of thickness (density) inside the bones. Another name for osteopenia is low bone mass. Mild osteopenia is a normal part of aging. It is not a disease, and it does not cause symptoms. However, if you have osteopenia and continue to lose bone mass, you could develop a condition that causes the bones to become thin and break more easily (osteoporosis). Osteoporosis can cause you to lose some height, have back pain, and have a stooped posture. Although osteopenia is not a disease, making changes to your lifestyle and diet can help to prevent osteopenia from developing into osteoporosis. What are the causes? Osteopenia is caused by loss of calcium in the bones. Bones are constantly changing. Old bone cells are continually being replaced with new bone cells. This process builds new bone. The mineral calcium is needed to build new bone and maintain bone density. Bone density is usually highest around age 41. After that, most people's bodies cannot replace all the bone they have lost with new bone. What increases the risk? You are more likely to develop this condition if: You are older than age 67. You are a woman who went through menopause early. You have a long illness that keeps you in bed. You do not get enough exercise. You lack certain nutrients (malnutrition). You have an overactive thyroid gland  (hyperthyroidism). You use products that contain nicotine or tobacco, such as cigarettes, e-cigarettes and chewing tobacco, or you drink a lot of alcohol. You are taking medicines that weaken the bones, such as steroids. What are the signs or symptoms? This condition does not cause any symptoms. You may have a slightly higher risk for bone breaks (fractures), so getting fractures more easily than normal may be an indication of osteopenia. How is this diagnosed? This condition may be diagnosed based on an X-ray exam that measures bone density (dual-energy X-ray absorptiometry, or DEXA). This test can measure bone density in your hips, spine, and wrists. Osteopenia has no symptoms, so this condition is usually diagnosed after a routine bone density screening test is done for osteoporosis. This routine screening is usually done for: Women who are age 66 or older. Men who are age 45 or older. If you have risk factors for osteopenia, you may have the screening test at an earlier age. How is this treated? Making dietary and lifestyle changes can lower your risk for osteoporosis. If you have severe osteopenia that is close to becoming osteoporosis, this condition can be treated with medicines and dietary supplements such as calcium and vitamin D. These supplements help to rebuild bone density. Follow these instructions at home: Eating and drinking Eat a diet that is high in calcium and vitamin D. Calcium is found in dairy products, beans, salmon, and leafy green vegetables like spinach and broccoli. Look for foods that have vitamin D and calcium added to them (fortified foods), such  as orange juice, cereal, and bread.  Lifestyle Do 30 minutes or more of a weight-bearing exercise every day, such as walking, jogging, or playing a sport. These types of exercises strengthen the bones. Do not use any products that contain nicotine or tobacco, such as cigarettes, e-cigarettes, and chewing tobacco. If you  need help quitting, ask your health care provider. Do not drink alcohol if: Your health care provider tells you not to drink. You are pregnant, may be pregnant, or are planning to become pregnant. If you drink alcohol: Limit how much you use to: 0-1 drink a day for women. 0-2 drinks a day for men. Be aware of how much alcohol is in your drink. In the [place] one drink equals one 12 oz bottle of beer (355 mL), one 5 oz glass of wine (148 mL), or one 1 oz glass of hard liquor (44 mL). General instructions Take over-the-counter and prescription medicines only as told by your health care provider. These include vitamins and supplements. Take precautions at home to lower your risk of falling, such as: Keeping rooms well-lit and free of clutter, such as cords. Installing safety rails on stairs. Using rubber mats in the bathroom or other areas that are often wet or slippery. Keep all follow-up visits. This is important. Contact a health care provider if: You have not had a bone density screening for osteoporosis and you are: A woman who is age 3 or older. A man who is age 3 or older. You are a postmenopausal woman who has not had a bone density screening for osteoporosis. You are older than age 69 and you want to know if you should have bone density screening for osteoporosis. Summary Osteopenia is a loss of thickness (density) inside the bones. Another name for osteopenia is low bone mass. Osteopenia is not a disease, but it may increase your risk for a condition that causes the bones to become thin and break more easily (osteoporosis). You may be at risk for osteopenia if you are older than age 59 or if you are a woman who went through early menopause. Osteopenia does not cause any symptoms, but it can be diagnosed with a bone density screening test. Dietary and lifestyle changes are the first treatment for osteopenia. These may lower your risk for osteoporosis. This information is not  intended to replace advice given to you by your health care provider. Make sure you discuss any questions you have with your health care provider. Document Revised: 10/04/2019 Document Reviewed: 10/04/2019 Elsevier Patient Education  [employer].

## 2021-04-07 NOTE — Progress Notes (Signed)
[name], female    DOB: 27-Feb-1946  Age: 75 y.o. MRN: [id number]    Subjective:   Chief Complaint  Patient presents with   6 months follow up   Subjective   HPI Lindsay Harrison presents for office visit today for follow up on right torn meniscus and osteopenia. She is doing well and has no ER visits or recent illnesses to report. She was recently on a trip overseas and after her torn meniscus was causing her trouble. She has received an injection from [employer]. She endorses applying Voltaren for her knee pain. Denies CP/palp/SOB/HA/congestion/fevers/GI or GU c/o. Taking meds as prescribed.   Review of Systems  Constitutional:  Negative for chills, fatigue and fever.  HENT:  Negative for congestion, rhinorrhea, sinus pressure, sinus pain and sore throat.   Eyes:  Negative for pain.  Respiratory:  Negative for cough and shortness of breath.   Cardiovascular:  Negative for chest pain, palpitations and leg swelling.  Gastrointestinal:  Negative for abdominal pain, blood in stool, diarrhea, nausea and vomiting.  Genitourinary:  Negative for decreased urine volume, flank pain, frequency, vaginal bleeding and vaginal discharge.  Musculoskeletal:  Negative for back pain.  Neurological:  Negative for headaches.   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   Her family history includes Arthritis in her mother and sister; COPD in her sister; Cancer in her paternal grandfather; Heart disease in her father; Obesity in her sister; Other in her sister; Stroke in her maternal aunt.She reports that she has never smoked. She has never used smokeless tobacco. She reports current alcohol use of about 3.0 - 6.0 standard drinks per week. She reports that she does not use drugs.  Current Outpatient Medications on File Prior to Visit  Medication Sig Dispense Refill   Calcium-Magnesium-Vitamin D (CALCIUM MAGNESIUM PO) Take by mouth.     Cholecalciferol (VITAMIN D) 2000 units CAPS Take 2,000 Units by mouth daily.     ELDERBERRY PO Take by mouth.     Multiple Vitamins-Minerals (ZINC PO) Take by mouth.     Omega-3 Fatty Acids (OMEGA-3 PO) Take 2 capsules by mouth daily. Omega 3 1280 mg     vitamin E 180 MG (400 UNITS) capsule Take 400 Units by mouth daily.     No current facility-administered medications on file prior to visit.     Objective:  Objective  Physical Exam Constitutional:      General: She is not in acute distress.    Appearance: Normal appearance. She is not ill-appearing or toxic-appearing.  HENT:     Head: Normocephalic and atraumatic.     Right Ear: Tympanic membrane, ear canal and external ear normal.  Left Ear: Tympanic membrane, ear canal and external ear normal.     Nose: No congestion or rhinorrhea.  Eyes:     Extraocular Movements: Extraocular movements intact.     Pupils: Pupils are equal, round, and reactive to light.  Cardiovascular:     Rate and Rhythm: Normal rate and regular rhythm.     Pulses: Normal pulses.     Heart sounds: Normal heart sounds. No murmur heard. Pulmonary:     Effort: Pulmonary effort is normal. No respiratory distress.     Breath sounds: Normal breath sounds. No wheezing, rhonchi or rales.  Abdominal:     General: Bowel  sounds are normal.     Palpations: Abdomen is soft. There is no mass.     Tenderness: There is no abdominal tenderness. There is no guarding.     Hernia: No hernia is present.  Musculoskeletal:        General: Normal range of motion.     Cervical back: Normal range of motion and neck supple.  Skin:    General: Skin is warm and dry.  Neurological:     Mental Status: She is alert and oriented to person, place, and time.  Psychiatric:        Behavior: Behavior normal.   BP 128/76   Pulse 64   Temp 98.2 F (36.8 C)   Resp 16   Wt 166 lb 12.8 oz (75.7 kg)   SpO2 97%   BMI 24.63 kg/m  Wt Readings from Last 3 Encounters:  04/07/21 166 lb 12.8 oz (75.7 kg)  02/11/21 167 lb 6.4 oz (75.9 kg)  08/14/20 163 lb 9.6 oz (74.2 kg)     Lab Results  Component Value Date   WBC 5.7 02/11/2021   HGB 14.0 02/11/2021   HCT 41.2 02/11/2021   PLT 308 02/11/2021   GLUCOSE 108 (H) 02/11/2021   CHOL 211 (H) 05/29/2020   TRIG 109.0 05/29/2020   HDL 67.70 05/29/2020   LDLCALC 121 (H) 05/29/2020   ALT 18 02/11/2021   AST 18 02/11/2021   NA 144 02/11/2021   K 4.2 02/11/2021   CL 110 02/11/2021   CREATININE 0.72 02/11/2021   BUN 16 02/11/2021   CO2 26 02/11/2021   TSH 1.55 05/29/2020   INR 1.00 10/27/2016   HGBA1C 5.8 05/29/2020    DG Bone Density  Result Date: 04/07/2021 EXAM: DUAL X-RAY ABSORPTIOMETRY (DXA) FOR BONE MINERAL DENSITY IMPRESSION: Lindsay Harrison A Lindsay Harrison Your patient Lindsay Harrison completed a BMD test on 04/07/2021 using the [place] (analysis version: 16.SP2) manufactured by [employer]. The following summarizes the results of our evaluation. [facility] PATIENT: Name: Lindsay Harrison, Lindsay Harrison Patient ID: [id number] Birth Date: [birth year] Height: 67.2 in. Gender: Female Measured: 04/07/2021 Weight: 166.0 lbs. Indications: Advanced Age, Caucasian, Chemotherapy for Cancer, Estrogen Deficiency, History of Osteopenia, Low Calcium Intake, Post Menopausal Fractures: Treatments: Vitamin D  ASSESSMENT: The BMD measured at AP Spine L1-L2 is 1.002 g/cm2 with a T-score of -1.4. This patient is considered osteopenic according to [facility]) criteria. L-3 & 4 was excluded due to degenerative changes. Compared with the prior study on, 01/27/2016 the BMD of the total mean shows a statistically significant decrease. The scan quality is good. Site Region Measured Date Measured Age WHO YA BMD Classification T-score AP Spine L1-L2 04/07/2021 75.0 Low Bone Mass -1.4 1.002 g/cm2 AP Spine L1-L2 01/27/2016 69.8 Low Bone Mass -1.4 0.996 g/cm2 DualFemur Total Mean 04/07/2021 75.0 Normal -0.7 0.916 g/cm2 DualFemur Total  Mean 01/27/2016 69.8 Normal -0.1 0.992 g/cm2 World Health Organization [facility]) criteria for post-menopausal, Caucasian Women: Normal        T-score at or above -1 SD Low Bone Mass T-score between -1 and -2.5 SD Osteoporosis  T-score at or below -2.5 SD RECOMMENDATION: 1. All patients should optimize calcium and vitamin D intake. 2. Consider FDA-approved medical therapies in postmenopausal women and men aged 79 years and older, based on the following: a. A hip or vertebral(clinical or morphometric) fracture. b. T-Score < -2.5 at the femoral neck or spine after appropriate evaluation to exclude secondary causes c. Low bone mass (T-score between -1.0 and -2.5 at the femoral neck or spine) and a 10 year probability of a hip fracture >3% or a 10 year probability of major osteoporosis-related fracture > 20% based on the [place]-adapted WHO algorithm d. Clinical judgement and/or patient preferences may indicate treatment for people with 10-year fracture probabilities above or below these levels FOLLOW-UP: Patients with diagnosis of osteoporosis or at high risk for fracture should have regular bone mineral density tests. For patients eligible for Medicare, routine testing is allowed once every 2 years. The testing frequency can be increased to one year for patients who have rapidly progressing  disease, those who are receiving or discontinuing medical therapy to restore bone mass, or have additional risk factors. I have reviewed this report, and agree with the above findings. [facility] Patient: Lindsay Harrison   Referring Physician: [name] Birth Date: Sep 05, 1945 [age]:       [age] Patient ID: [id number] Height: 67.2 in. Weight: 166.0 lbs. Measured: 04/07/2021 1:02:49 PM (16 SP 4) Gender: Female Ethnicity: White Analyzed: 04/07/2021 1:11:59 PM (16 SP 4) FRAX* 10-year Probability of Fracture Based on femoral neck BMD: DualFemur (Right) Major Osteoporotic Fracture: 9.0% Hip Fracture:                1.2% Population:                  [place] (Caucasian) Risk Factors:                None *FRAX is a Materials engineer of the [employer] for Metabolic Bone Disease, a World Pharmacologist (WHO) [facility]. ASSESSMENT: The probability of a major osteoporotic fracture is 9.0% within the next ten years. The probability of a hip fracture is 1.2% within the next ten years. Electronically Signed   By: Rolm Baptise M.D.   On: 04/07/2021 14:23     Assessment & Plan:  Plan    No orders of the defined types were placed in this encounter.   Problem List Items Addressed This Visit     Hyperlipidemia, mixed    Encourage heart healthy diet such as MIND or DASH diet, increase exercise, avoid trans fats, simple carbohydrates and processed foods, consider a krill or fish or flaxseed oil cap daily.       Vitamin D deficiency    Supplement and monitor      Diffuse large B cell lymphoma ([place])    She is approaching her 5 year anniversary of her diagnosis and is feeling well      Grief reaction    She has just returned from her [place] in [place] and her hike was very healing. She feels well.       Hyperglycemia    hgba1c acceptable, minimize simple carbs. Increase exercise as tolerated.       Other Visit Diagnoses  Colon cancer screening     -  Primary   Relevant Orders   Ambulatory referral to Gastroenterology       Follow-up: Return in about 6 months (around 10/06/2021) for cpe.  I, Suezanne Jacquet, acting as a scribe for Penni Homans, MD, have documented all relevent documentation on behalf of Penni Homans, MD, as directed by Penni Homans, MD while in the presence of Penni Homans, MD. DO:04/08/21.  I, [name], MD personally performed the services described in this documentation. All medical record entries made by the scribe were at my direction and in my presence. I have reviewed the chart and agree that the record reflects my personal performance and is accurate and complete

## 2021-04-08 NOTE — Assessment & Plan Note (Signed)
Supplement and monitor 

## 2021-04-08 NOTE — Assessment & Plan Note (Signed)
She has just returned from her [place] in [place] and her hike was very healing. She feels well.

## 2021-04-08 NOTE — Assessment & Plan Note (Signed)
She is approaching her 5 year anniversary of her diagnosis and is feeling well

## 2021-04-08 NOTE — Assessment & Plan Note (Signed)
Encourage heart healthy diet such as MIND or DASH diet, increase exercise, avoid trans fats, simple carbohydrates and processed foods, consider a krill or fish or flaxseed oil cap daily.  °

## 2021-04-08 NOTE — Assessment & Plan Note (Signed)
hgba1c acceptable, minimize simple carbs. Increase exercise as tolerated.  

## 2021-04-16 ENCOUNTER — Ambulatory Visit: Payer: Medicare Other

## 2021-04-16 ENCOUNTER — Other Ambulatory Visit (HOSPITAL_BASED_OUTPATIENT_CLINIC_OR_DEPARTMENT_OTHER): Payer: Medicare Other

## 2021-04-20 ENCOUNTER — Ambulatory Visit (INDEPENDENT_AMBULATORY_CARE_PROVIDER_SITE_OTHER): Payer: Medicare Other

## 2021-04-20 VITALS — Ht 69.0 in | Wt 161.0 lb

## 2021-04-20 DIAGNOSIS — Z0000 Encounter for general adult medical examination without abnormal findings: Secondary | ICD-10-CM

## 2021-04-20 NOTE — Progress Notes (Signed)
Subjective:   Lindsay Harrison is a 75 y.o. female who presents for Medicare Annual (Subsequent) preventive examination.  I connected with  Lindsay Harrison today by a video enabled telemedicine application and verified that I am speaking with the correct person using two identifiers.  Location of patient:Home Location of provider:Work  Persons participating in the virtual visit: patient, nurse.   I discussed the limitations, risk, security and privacy concerns of evaluation and management by telemedicine. The patient expressed understanding and agreed to proceed.  Some vital signs may be absent or patient reported.   Review of Systems     Cardiac Risk Factors include: advanced age (>86mn, >>81women);dyslipidemia     Objective:    Today's Vitals   04/20/21 0822  Weight: 161 lb (73 kg)  Height: 5' 9" (1.753 m)   Body mass index is 23.78 kg/m.  Advanced Directives 04/20/2021 04/10/2020 11/26/2019 09/24/2019 07/23/2019 05/21/2019 02/15/2019  Does Patient Have a Medical Advance Directive? _0  Yes Yes  Type of AParamedicof ARogersvilleLiving will HAtholLiving will H[place]Living will HUnion ParkLiving will HCenturiaLiving will HWoodlakeLiving will HWaynes[place]Living will  Does patient want to make changes to medical advance directive? - - No - Patient declined No - Patient declined No - Patient declined No - Patient declined No - Patient declined  Copy of HPrattin Chart? Yes - validated most recent copy scanned in chart (See row information) Yes - validated most recent copy scanned in chart (See row information) No - copy requested Yes - validated most recent copy scanned in chart (See row information) - Yes - validated most recent copy scanned in chart (See row information) Yes - validated most recent copy scanned in  chart (See row information)    Current Medications (verified) Outpatient Encounter Medications as of 04/20/2021  Medication Sig   Calcium-Magnesium-Vitamin D (CALCIUM MAGNESIUM PO) Take by mouth.   Cholecalciferol (VITAMIN D) 2000 units CAPS Take 2,000 Units by mouth daily.   ELDERBERRY PO Take by mouth.   Multiple Vitamins-Minerals (ZINC PO) Take by mouth.   Omega-3 Fatty Acids (OMEGA-3 PO) Take 2 capsules by mouth daily. Omega 3 1280 mg   vitamin E 180 MG (400 UNITS) capsule Take 400 Units by mouth daily.   No facility-administered encounter medications on file as of 04/20/2021.    Allergies (verified) Erythromycin and Other   History: Past Medical History:  Diagnosis Date   Anemia    h/o iron deficiency secondary to heavy menses   Arthritis    left hand index finger   Diffuse large B cell lymphoma (HGarfield dx'd 04/2016   Grief reaction 01/27/2016   H/O measles    as a child   History of chicken pox    as a child   History of PCOS    Hyperlipidemia, mixed 01/27/2016   Lymphadenopathy    Peripheral neuropathy 01/31/2017   Preventative health care 01/27/2016   Vitamin D deficiency    Wears glasses    Past Surgical History:  Procedure Laterality Date   COLONOSCOPY     IR GENERIC HISTORICAL  05/21/2016   IR FLUORO GUIDE PORT INSERTION RIGHT 05/21/2016 DArne Cleveland MD [facility]   IR GENERIC HISTORICAL  05/21/2016   IR [place] VASC ACCESS RIGHT 05/21/2016 DArne Cleveland MD [facility]   IR REMOVAL TUN ACCESS W/ PORT W/O FL  MOD SED  10/27/2016   SKIN BIOPSY Left    face   THYROGLOSSAL DUCT CYST N/A 04/16/2016   Procedure: THYROGLOSSAL DUCT CYST excision;  Surgeon: [name], MD;  Location: [facility] OR;  Service: ENT;  Laterality: N/A;   Family History  Problem Relation Age of Onset   Arthritis Mother        rheumatoid   Heart disease Father        CHF   COPD Sister        Emphysema, h/o cigarettes   Other Sister        pituatary tumor   Arthritis Sister     Obesity Sister    Cancer Paternal Grandfather        cancer   Stroke Maternal Aunt    Social History   Socioeconomic History   Marital status: Widowed    Spouse name: Not on file   Number of children: Not on file   Years of education: Not on file   Highest education level: Not on file  Occupational History   Occupation: Psychotherapist  Tobacco Use   Smoking status: Never   Smokeless tobacco: Never  Vaping Use   Vaping Use: Never used  Substance and Sexual Activity   Alcohol use: Yes    Alcohol/week: 3.0 - 6.0 standard drinks    Types: 3 - 6 Standard drinks or equivalent per week    Comment: 5 glasses of wine a week.   Drug use: No   Sexual activity: Never    Partners: Male    Birth control/protection: None  Other Topics Concern   Not on file  Social History Narrative   Married. Education: [employer]. Exercise: walking, yoga, and bicycling daily for 1-2 hours. Lives alone, works as Estate manager/land agent Strain: Low Risk    Difficulty of Paying Living Expenses: Not hard at all  Food Insecurity: No Food Insecurity   Worried About Charity fundraiser in the Last Year: Never true   Arboriculturist in the Last Year: Never true  Transportation Needs: No Transportation Needs   Lack of Transportation (Medical): No   Lack of Transportation (Non-Medical): No  Physical Activity: Sufficiently Active   Days of Exercise per Week: 7 days   Minutes of Exercise per Session: 120 min  Stress: No Stress Concern Present   Feeling of Stress : Not at all  Social Connections: Moderately Integrated   Frequency of Communication with Friends and Family: More than three times a week   Frequency of Social Gatherings with Friends and Family: More than three times a week   Attends Religious Services: More than 4 times per year   Active Member of Clubs or Organizations: Not on file   Attends Archivist Meetings: More than 4 times per year    Marital Status: Widowed    Tobacco Counseling Counseling given: Not Answered   Clinical Intake:  Pre-visit preparation completed: Yes        BMI - recorded: 23.78 Nutritional Status: BMI of 19-24  Normal Nutritional Risks: None Diabetes: No  How often do you need to have someone help you when you read instructions, pamphlets, or other written materials from your doctor or pharmacy?: 1 - Never  Diabetic?No  Interpreter Needed?: No  Information entered by :: Caroleen Hamman LPN   Activities of Daily Living In your present state of health, do you have any difficulty performing the following activities: 04/20/2021  Hearing? N  Vision? N  Difficulty concentrating or making decisions? N  Walking or climbing stairs? N  Dressing or bathing? N  Doing errands, shopping? N  Preparing Food and eating ? N  Using the Toilet? N  In the past six months, have you accidently leaked urine? N  Do you have problems with loss of bowel control? N  Managing your Medications? N  Managing your Finances? N  Housekeeping or managing your Housekeeping? N  Some recent data might be hidden    Patient Care Team: [name], MD as PCP - General (Family Medicine) Mammography, [facility] (Diagnostic Radiology)  Indicate any recent Medical Services you may have received from other than [facility] providers in the past year (date may be approximate).     Assessment:   This is a routine wellness examination for Ebba.  Hearing/Vision screen Hearing Screening - Comments:: No issues Vision Screening - Comments:: Last eye exam-06/2020-Dr. Lyles  Dietary issues and exercise activities discussed: Current Exercise Habits: Home exercise routine, Type of exercise: walking (tai chai), Time (Minutes): 60, Frequency (Times/Week): 7, Weekly Exercise (Minutes/Week): 420, Intensity: Mild, Exercise limited by: None identified   Goals Addressed               This Visit's Progress     Patient Stated      continue being socially and physically active (pt-stated)   On track      Depression Screen PHQ 2/9 Scores 04/20/2021 04/10/2020 02/15/2019 07/13/2018 02/02/2018 01/31/2017 01/31/2017  PHQ - 2 Score 0 0 0 0 0 0 0    Fall Risk Fall Risk  04/20/2021 04/10/2020 02/15/2019 07/13/2018 03/22/2018  Falls in the past year? 0 0 0 0 0  Comment - - - - Emmi Telephone Survey: data to providers prior to load  Number falls in past yr: 0 0 0 - -  Injury with Fall? 0 0 0 - -  Follow up Falls prevention discussed Falls prevention discussed - - -    FALL RISK PREVENTION PERTAINING TO THE HOME:  Any stairs in or around the home? Yes  If so, are there any without handrails? No  Home free of loose throw rugs in walkways, pet beds, electrical cords, etc? Yes  Adequate lighting in your home to reduce risk of falls? Yes   ASSISTIVE DEVICES UTILIZED TO PREVENT FALLS:  Life alert? No  Use of a cane, walker or w/c? No  Grab bars in the bathroom? Yes  Shower chair or bench in shower? Yes  Elevated toilet seat or a handicapped toilet? No   TIMED UP AND GO:  Was the test performed? No . Phone visit   Cognitive Function:Normal cognitive status assessed by direct observation by this Nurse Health Advisor. No abnormalities found.          Immunizations Immunization History  Administered Date(s) Administered   Fluad Quad(high Dose 65+) 01/15/2019   Hepatitis A 07/17/2007, 04/04/2008   Hepatitis B 07/17/2007, 09/11/2007, 02/15/2008   Influenza Split 02/24/2013, 02/03/2021   Influenza, High Dose Seasonal PF 01/27/2016   Influenza,inj,Quad PF,6+ Mos 01/16/2014, 02/05/2015, 01/10/2017, 01/23/2018   Influenza-Unspecified 01/29/2020   PFIZER Comirnaty(Gray Top)Covid-19 Tri-Sucrose Vaccine 06/29/2020   PFIZER(Purple Top)SARS-COV-2 Vaccination 06/07/2019, 07/02/2019, 12/28/2019   Pfizer Covid-19 Vaccine Bivalent Booster 33yr & up 01/16/2021   Pneumococcal Conjugate-13 01/16/2014   Pneumococcal  Polysaccharide-23 10/18/2016   Pneumococcal-Unspecified 02/12/2009   Td 10/27/2005   Tdap 02/05/2015   Typhoid Inactivated 07/17/2007   Zoster Recombinat (Shingrix) 04/11/2020, 06/12/2020  Zoster, Live 06/26/2007    TDAP status: Up to date  Flu Vaccine status: Up to date  Pneumococcal vaccine status: Up to date  Covid-19 vaccine status: Completed vaccines  Qualifies for Shingles Vaccine? No   Zostavax completed Yes   Shingrix Completed?: Yes  Screening Tests Health Maintenance  Topic Date Due   MAMMOGRAM  03/07/2021   COLONOSCOPY (Pts 45-31yr Insurance coverage will need to be confirmed)  03/14/2021   DEXA SCAN  04/08/2023   TETANUS/TDAP  02/04/2025   Pneumonia Vaccine 75 Years old  Completed   INFLUENZA VACCINE  Completed   COVID-19 Vaccine  Completed   Hepatitis C Screening  Completed   Zoster Vaccines- Shingrix  Completed   HPV VACCINES  Aged Out    Health Maintenance  Health Maintenance Due  Topic Date Due   MAMMOGRAM  03/07/2021   COLONOSCOPY (Pts 45-442yrInsurance coverage will need to be confirmed)  03/14/2021    Colorectal cancer screening: Referral to GI placed previously by PCP. Pt aware the office will call re: appt.  Mammogram status: Completed bilateral 03/2021 per paient. Repeat every year-Awaiting report-Normal per patient  Bone Density status: Completed 04/07/2021. Results reflect: Bone density results: OSTEOPENIA. Repeat every 2 years.  Lung Cancer Screening: (Low Dose CT Chest recommended if Age 413-80ears, 30 pack-year currently smoking OR have quit w/in 15years.) does not qualify   Additional Screening:  Hepatitis C Screening: Completed 05/07/2016  Vision Screening: Recommended annual ophthalmology exams for early detection of glaucoma and other disorders of the eye. Is the patient up to date with their annual eye exam?  Yes  Who is the provider or what is the name of the office in which the patient attends annual eye exams? Dr.  LyPrudencio Burly Dental Screening: Recommended annual dental exams for proper oral hygiene  Community Resource Referral / Chronic Care Management: CRR required this visit?  No   CCM required this visit?  No      Plan:     I have personally reviewed and noted the following in the patients chart:   Medical and social history Use of alcohol, tobacco or illicit drugs  Current medications and supplements including opioid prescriptions.  Functional ability and status Nutritional status Physical activity Advanced directives List of other physicians Hospitalizations, surgeries, and ER visits in previous 12 months Vitals Screenings to include cognitive, depression, and falls Referrals and appointments  In addition, I have reviewed and discussed with patient certain preventive protocols, quality metrics, and best practice recommendations. A written personalized care plan for preventive services as well as general preventive health recommendations were provided to patient.   Due to this being a Virtual visit, the after visit summary with patients personalized plan was offered to patient via mail or my-chart.  Patient would like to access on my-chart.   MaMarta AntuLPN   [id number] health Advisor  Nurse Notes: None

## 2021-04-20 NOTE — Patient Instructions (Signed)
Lindsay Harrison , Thank you for taking time to complete your Medicare Wellness Visit. I appreciate your ongoing commitment to your health goals. Please review the following plan we discussed and let me know if I can assist you in the future.   Screening recommendations/referrals: Colonoscopy: Due-Per our conversation, you are awaiting call from GI to schedule. Mammogram: Per our conversation, completed 03/2021 -Due 03/2022 Bone Density: Completed 04/07/2021-Due 04/07/2022 Recommended yearly ophthalmology/optometry visit for glaucoma screening and checkup Recommended yearly dental visit for hygiene and checkup  Vaccinations: Influenza vaccine: Up to date Pneumococcal vaccine: Up to date Tdap vaccine: Up to date Shingles vaccine: Completed vaccines   Covid-19:Up to date  Advanced directives: Copy in chart  Conditions/risks identified: See problem list  Next appointment: Follow up in one year for your annual wellness visit 04/22/2022 @ 8:20 ( video visit)   Preventive Care 67 Years and Older, Female Preventive care refers to lifestyle choices and visits with your health care provider that can promote health and wellness. What does preventive care include? A yearly physical exam. This is also called an annual well check. Dental exams once or twice a year. Routine eye exams. Ask your health care provider how often you should have your eyes checked. Personal lifestyle choices, including: Daily care of your teeth and gums. Regular physical activity. Eating a healthy diet. Avoiding tobacco and drug use. Limiting alcohol use. Practicing safe sex. Taking low-dose aspirin every day. Taking vitamin and mineral supplements as recommended by your health care provider. What happens during an annual well check? The services and screenings done by your health care provider during your annual well check will depend on your age, overall health, lifestyle risk factors, and family history of  disease. Counseling  Your health care provider may ask you questions about your: Alcohol use. Tobacco use. Drug use. Emotional well-being. Home and relationship well-being. Sexual activity. Eating habits. History of falls. Memory and ability to understand (cognition). Work and work Statistician. Reproductive health. Screening  You may have the following tests or measurements: Height, weight, and BMI. Blood pressure. Lipid and cholesterol levels. These may be checked every 5 years, or more frequently if you are over 29 years old. Skin check. Lung cancer screening. You may have this screening every year starting at age 56 if you have a 30-pack-year history of smoking and currently smoke or have quit within the past 15 years. Fecal occult blood test (FOBT) of the stool. You may have this test every year starting at age 79. Flexible sigmoidoscopy or colonoscopy. You may have a sigmoidoscopy every 5 years or a colonoscopy every 10 years starting at age 31. Hepatitis C blood test. Hepatitis B blood test. Sexually transmitted disease (STD) testing. Diabetes screening. This is done by checking your blood sugar (glucose) after you have not eaten for a while (fasting). You may have this done every 1-3 years. Bone density scan. This is done to screen for osteoporosis. You may have this done starting at age 60. Mammogram. This may be done every 1-2 years. Talk to your health care provider about how often you should have regular mammograms. Talk with your health care provider about your test results, treatment options, and if necessary, the need for more tests. Vaccines  Your health care provider may recommend certain vaccines, such as: Influenza vaccine. This is recommended every year. Tetanus, diphtheria, and acellular pertussis (Tdap, Td) vaccine. You may need a Td booster every 10 years. Zoster vaccine. You may need this after age 24.  Pneumococcal 13-valent conjugate (PCV13) vaccine. One  dose is recommended after age 72. Pneumococcal polysaccharide (PPSV23) vaccine. One dose is recommended after age 43. Talk to your health care provider about which screenings and vaccines you need and how often you need them. This information is not intended to replace advice given to you by your health care provider. Make sure you discuss any questions you have with your health care provider. Document Released: 05/16/2015 Document Revised: 01/07/2016 Document Reviewed: 02/18/2015 Elsevier Interactive Patient Education  2017 [place] Prevention in the Home Falls can cause injuries. They can happen to people of all ages. There are many things you can do to make your home safe and to help prevent falls. What can I do on the outside of my home? Regularly fix the edges of walkways and driveways and fix any cracks. Remove anything that might make you trip as you walk through a door, such as a raised step or threshold. Trim any bushes or trees on the path to your home. Use bright outdoor lighting. Clear any walking paths of anything that might make someone trip, such as rocks or tools. Regularly check to see if handrails are loose or broken. Make sure that both sides of any steps have handrails. Any raised decks and porches should have guardrails on the edges. Have any leaves, snow, or ice cleared regularly. Use sand or salt on walking paths during winter. Clean up any spills in your garage right away. This includes oil or grease spills. What can I do in the bathroom? Use night lights. Install grab bars by the toilet and in the tub and shower. Do not use towel bars as grab bars. Use non-skid mats or decals in the tub or shower. If you need to sit down in the shower, use a plastic, non-slip stool. Keep the floor dry. Clean up any water that spills on the floor as soon as it happens. Remove soap buildup in the tub or shower regularly. Attach bath mats securely with double-sided  non-slip rug tape. Do not have throw rugs and other things on the floor that can make you trip. What can I do in the bedroom? Use night lights. Make sure that you have a light by your bed that is easy to reach. Do not use any sheets or blankets that are too big for your bed. They should not hang down onto the floor. Have a firm chair that has side arms. You can use this for support while you get dressed. Do not have throw rugs and other things on the floor that can make you trip. What can I do in the kitchen? Clean up any spills right away. Avoid walking on wet floors. Keep items that you use a lot in easy-to-reach places. If you need to reach something above you, use a strong step stool that has a grab bar. Keep electrical cords out of the way. Do not use floor polish or wax that makes floors slippery. If you must use wax, use non-skid floor wax. Do not have throw rugs and other things on the floor that can make you trip. What can I do with my stairs? Do not leave any items on the stairs. Make sure that there are handrails on both sides of the stairs and use them. Fix handrails that are broken or loose. Make sure that handrails are as long as the stairways. Check any carpeting to make sure that it is firmly attached to the stairs. Fix any  carpet that is loose or worn. Avoid having throw rugs at the top or bottom of the stairs. If you do have throw rugs, attach them to the floor with carpet tape. Make sure that you have a light switch at the top of the stairs and the bottom of the stairs. If you do not have them, ask someone to add them for you. What else can I do to help prevent falls? Wear shoes that: Do not have high heels. Have rubber bottoms. Are comfortable and fit you well. Are closed at the toe. Do not wear sandals. If you use a stepladder: Make sure that it is fully opened. Do not climb a closed stepladder. Make sure that both sides of the stepladder are locked into place. Ask  someone to hold it for you, if possible. Clearly mark and make sure that you can see: Any grab bars or handrails. First and last steps. Where the edge of each step is. Use tools that help you move around (mobility aids) if they are needed. These include: Canes. Walkers. Scooters. Crutches. Turn on the lights when you go into a dark area. Replace any light bulbs as soon as they burn out. Set up your furniture so you have a clear path. Avoid moving your furniture around. If any of your floors are uneven, fix them. If there are any pets around you, be aware of where they are. Review your medicines with your doctor. Some medicines can make you feel dizzy. This can increase your chance of falling. Ask your doctor what other things that you can do to help prevent falls. This information is not intended to replace advice given to you by your health care provider. Make sure you discuss any questions you have with your health care provider. Document Released: 02/13/2009 Document Revised: 09/25/2015 Document Reviewed: 05/24/2014 Elsevier Interactive Patient Education  2017 [employer].

## 2021-06-15 DIAGNOSIS — H04122 Dry eye syndrome of left lacrimal gland: Secondary | ICD-10-CM | POA: Diagnosis not present

## 2021-06-15 DIAGNOSIS — H2513 Age-related nuclear cataract, bilateral: Secondary | ICD-10-CM | POA: Diagnosis not present

## 2021-06-15 DIAGNOSIS — H52203 Unspecified astigmatism, bilateral: Secondary | ICD-10-CM | POA: Diagnosis not present

## 2021-08-02 ENCOUNTER — Encounter: Payer: Self-pay | Admitting: Family Medicine

## 2021-08-03 ENCOUNTER — Telehealth: Payer: Self-pay | Admitting: Hematology

## 2021-08-03 NOTE — Telephone Encounter (Signed)
Rescheduled upcoming appointment due to provider's PAL. Patient is aware of changes. ?

## 2021-08-04 ENCOUNTER — Other Ambulatory Visit: Payer: Self-pay

## 2021-08-04 ENCOUNTER — Inpatient Hospital Stay (HOSPITAL_BASED_OUTPATIENT_CLINIC_OR_DEPARTMENT_OTHER): Payer: Medicare Other | Admitting: Hematology

## 2021-08-04 ENCOUNTER — Inpatient Hospital Stay: Payer: Medicare Other | Attending: Hematology

## 2021-08-04 VITALS — BP 122/70 | HR 70 | Temp 97.2°F | Resp 20 | Wt 169.9 lb

## 2021-08-04 DIAGNOSIS — C8330 Diffuse large B-cell lymphoma, unspecified site: Secondary | ICD-10-CM

## 2021-08-04 DIAGNOSIS — Z79899 Other long term (current) drug therapy: Secondary | ICD-10-CM | POA: Diagnosis not present

## 2021-08-04 DIAGNOSIS — Z823 Family history of stroke: Secondary | ICD-10-CM | POA: Diagnosis not present

## 2021-08-04 DIAGNOSIS — Z8261 Family history of arthritis: Secondary | ICD-10-CM | POA: Insufficient documentation

## 2021-08-04 DIAGNOSIS — G620 Drug-induced polyneuropathy: Secondary | ICD-10-CM | POA: Diagnosis not present

## 2021-08-04 DIAGNOSIS — T451X5A Adverse effect of antineoplastic and immunosuppressive drugs, initial encounter: Secondary | ICD-10-CM | POA: Diagnosis not present

## 2021-08-04 DIAGNOSIS — Z809 Family history of malignant neoplasm, unspecified: Secondary | ICD-10-CM | POA: Diagnosis not present

## 2021-08-04 DIAGNOSIS — Z8249 Family history of ischemic heart disease and other diseases of the circulatory system: Secondary | ICD-10-CM | POA: Insufficient documentation

## 2021-08-04 DIAGNOSIS — Z8572 Personal history of non-Hodgkin lymphomas: Secondary | ICD-10-CM | POA: Diagnosis not present

## 2021-08-04 DIAGNOSIS — Z836 Family history of other diseases of the respiratory system: Secondary | ICD-10-CM | POA: Insufficient documentation

## 2021-08-04 LAB — CBC WITH DIFFERENTIAL (CANCER CENTER ONLY)
Abs Immature Granulocytes: 0.01 10*3/uL (ref 0.00–0.07)
Basophils Absolute: 0.1 10*3/uL (ref 0.0–0.1)
Basophils Relative: 1 %
Eosinophils Absolute: 0.1 10*3/uL (ref 0.0–0.5)
Eosinophils Relative: 2 %
HCT: 41.3 % (ref 36.0–46.0)
Hemoglobin: 13.9 g/dL (ref 12.0–15.0)
Immature Granulocytes: 0 %
Lymphocytes Relative: 26 %
Lymphs Abs: 1.5 10*3/uL (ref 0.7–4.0)
MCH: 30.7 pg (ref 26.0–34.0)
MCHC: 33.7 g/dL (ref 30.0–36.0)
MCV: 91.2 fL (ref 80.0–100.0)
Monocytes Absolute: 0.3 10*3/uL (ref 0.1–1.0)
Monocytes Relative: 6 %
Neutro Abs: 3.8 10*3/uL (ref 1.7–7.7)
Neutrophils Relative %: 65 %
Platelet Count: 265 10*3/uL (ref 150–400)
RBC: 4.53 MIL/uL (ref 3.87–5.11)
RDW: 12.7 % (ref 11.5–15.5)
WBC Count: 5.8 10*3/uL (ref 4.0–10.5)
nRBC: 0 % (ref 0.0–0.2)

## 2021-08-04 LAB — CMP (CANCER CENTER ONLY)
ALT: 17 U/L (ref 0–44)
AST: 15 U/L (ref 15–41)
Albumin: 4.2 g/dL (ref 3.5–5.0)
Alkaline Phosphatase: 89 U/L (ref 38–126)
Anion gap: 6 (ref 5–15)
BUN: 16 mg/dL (ref 8–23)
CO2: 29 mmol/L (ref 22–32)
Calcium: 9.5 mg/dL (ref 8.9–10.3)
Chloride: 104 mmol/L (ref 98–111)
Creatinine: 0.59 mg/dL (ref 0.44–1.00)
GFR, Estimated: >60 mL/min (ref >60)
Glucose, Bld: 162 mg/dL — ABNORMAL HIGH (ref 70–99)
Potassium: 3.9 mmol/L (ref 3.5–5.1)
Sodium: 139 mmol/L (ref 135–145)
Total Bilirubin: 0.6 mg/dL (ref 0.3–1.2)
Total Protein: 6.6 g/dL (ref 6.5–8.1)

## 2021-08-04 LAB — LACTATE DEHYDROGENASE: LDH: 161 U/L (ref 98–192)

## 2021-08-04 NOTE — Progress Notes (Signed)
. ? ?  ? ?HEMATOLOGY/ONCOLOGY CLINIC NOTE ? ?Date of Service:  08/04/2021 ? ? ?Patient Care Team: ?Lindsay Lukes, MD as PCP - General (Family Medicine) ?Mammography, [facility] (Diagnostic Radiology) ?Lindsay Harrison M.D. (ENT) ? ?CHIEF COMPLAINTS/PURPOSE OF CONSULTATION:  ? ?F/u for follicular lymphoma/DLBCL ? ?HISTORY OF PRESENTING ILLNESS:  ? ?Please see previous note for details of initial presentation. ? ?INTERVAL HISTORY  ?[name] is a 76 y.o. 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 She reports She is doing well with no new symptoms or concerns. ? ?She reports some minor neuropathy in her toes. ? ?No fever, chills, night sweats. ?No new lumps, bumps, or lesions/rashes. ?No abdominal pain or change in bowel habits. ?No new or unexpected weight loss. ?No SOB or chest pain. ?No other new or acute focal symptoms. ? ?Lab results today CBC within normal limits, CMP within normal limits, LDH 161 ? ?MEDICAL HISTORY:  ?Past Medical History:  ?Diagnosis Date  ? Anemia   ? h/o iron deficiency secondary to heavy menses  ? Arthritis   ? left hand index finger  ? 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 Grief reaction 01/27/2016  ? H/O measles   ? as a child  ? History of chicken pox   ? as a child  ? History of PCOS   ? Hyperlipidemia, mixed 01/27/2016  ? Lymphadenopathy   ? Peripheral neuropathy 01/31/2017  ? Preventative health care 01/27/2016  ? Vitamin D deficiency   ? Wears glasses   ? ? ?SURGICAL HISTORY: ?Past Surgical History:  ?Procedure Laterality Date  ? COLONOSCOPY    ? IR GENERIC HISTORICAL  05/21/2016  ? IR FLUORO GUIDE PORT INSERTION RIGHT 05/21/2016 Arne Cleveland, MD [facility]  ? IR GENERIC HISTORICAL  05/21/2016  ? IR US GUIDE VASC ACCESS RIGHT 05/21/2016 Arne Cleveland, MD [facility]  ? IR REMOVAL TUN ACCESS W/ PORT W/O FL MOD SED  10/27/2016  ? SKIN BIOPSY Left   ? face  ? THYROGLOSSAL DUCT CYST N/A  04/16/2016  ? Procedure: THYROGLOSSAL DUCT CYST excision;  Surgeon: Lindsay Belfast, MD;  Location: [facility];  Service: ENT;  Laterality: N/A;  ? ? ?SOCIAL HISTORY: ?Social History  ? ?Socioeconomic History  ? Marital status: Widowed  ?  Spouse name: Not on file  ? Number of children: Not on file  ? Years of education: Not on file  ? Highest education level: Not on file  ?Occupational History  ? Occupation: Psychotherapist  ?Tobacco Use  ? Smoking status: Never  ? Smokeless tobacco: Never  ?Vaping Use  ? Vaping Use: Never used  ?Substance and Sexual Activity  ? Alcohol use: Yes  ?  Alcohol/week: 3.0 - 6.0 standard drinks  ?  Types: 3 - 6 Standard drinks or equivalent per week  ?  Comment: 5 glasses of wine a week.  ? Drug use: No  ? Sexual activity: Never  ?  Partners: Male  ?  Birth control/protection: None  ?Other Topics Concern  ? Not on file  ?Social History Narrative  ? Married. Education: [employer]. Exercise: walking, yoga, and bicycling daily for 1-2 hours. Lives alone, works as psychotherapist  ? ?Social Determinants of Health  ? ?Financial Resource Strain: Low Risk   ? Difficulty of Paying Living Expenses: Not hard at all  ?Food Insecurity: No Food Insecurity  ? Worried About Charity fundraiser in the Last  Year: Never true  ? Ran Out of Food in the Last Year: Never true  ?Transportation Needs: No Transportation Needs  ? Lack of Transportation (Medical): No  ? Lack of Transportation (Non-Medical): No  ?Physical Activity: Sufficiently Active  ? Days of Exercise per Week: 7 days  ? Minutes of Exercise per Session: 120 min  ?Stress: No Stress Concern Present  ? Feeling of Stress : Not at all  ?Social Connections: Moderately Integrated  ? Frequency of Communication with Friends and Family: More than three times a week  ? Frequency of Social Gatherings with Friends and Family: More than three times a week  ? Attends Religious Services: More than 4 times per year  ? Active Member of Clubs or Organizations: Not on file   ? Attends Archivist Meetings: More than 4 times per year  ? Marital Status: Widowed  ?Intimate Partner Violence: Not At Risk  ? Fear of Current or Ex-Partner: No  ? Emotionally Abused: No  ? Physically Abused: No  ? Sexually Abused: No  ? ? ?FAMILY HISTORY: ?Family History  ?Problem Relation Age of Onset  ? Arthritis Mother   ?     rheumatoid  ? Heart disease Father   ?     CHF  ? COPD Sister   ?     Emphysema, h/o cigarettes  ? Other Sister   ?     pituatary tumor  ? Arthritis Sister   ? Obesity Sister   ? Cancer Paternal Grandfather   ?     cancer  ? Stroke Maternal Aunt   ? ? ?ALLERGIES:  is allergic to erythromycin and other. ? ?MEDICATIONS:  ?Current Outpatient Medications  ?Medication Sig Dispense Refill  ? Calcium-Magnesium-Vitamin D (CALCIUM MAGNESIUM PO) Take by mouth.    ? Cholecalciferol (VITAMIN D) 2000 units CAPS Take 2,000 Units by mouth daily.    ? ELDERBERRY PO Take by mouth.    ? Multiple Vitamins-Minerals (ZINC PO) Take by mouth.    ? Omega-3 Fatty Acids (OMEGA-3 PO) Take 2 capsules by mouth daily. Omega 3 1280 mg    ? vitamin E 180 MG (400 UNITS) capsule Take 400 Units by mouth daily.    ? ?No current facility-administered medications for this visit.  ? ? ?REVIEW OF SYSTEMS:   ?.10 Point review of Systems was done is negative except as noted above. ? ? ?PHYSICAL EXAMINATION:  ?ECOG PERFORMANCE STATUS: 0 - Asymptomatic ? ?Vitals:  ? 08/04/21 1440  ?BP: 122/70  ?Pulse: 70  ?Resp: 20  ?Temp: (!) 97.2 ?F (36.2 ?C)  ?SpO2: 99%  ? ?Filed Weights  ? 08/04/21 1440  ?Weight: 169 lb 14.4 oz (77.1 kg)  ? ?.Body mass index is 25.09 kg/m?[name]  ?NAD ?GENERAL:alert, in no acute distress and comfortable ?SKIN: no acute rashes, no significant lesions ?EYES: conjunctiva are pink and non-injected, sclera anicteric ?NECK: supple, no JVD ?LYMPH:  no palpable lymphadenopathy in the cervical, axillary or inguinal regions ?LUNGS: clear to auscultation b/l with normal respiratory effort ?HEART: regular rate &  rhythm ?ABDOMEN:  normoactive bowel sounds , non tender, not distended. ?Extremity: no pedal edema ?PSYCH: alert & oriented x 3 with fluent speech ?NEURO: no focal motor/sensory deficits ? ?LABORATORY DATA:  ?I have reviewed the data as listed ? ?. ? ?  Latest Ref Rng & Units 08/04/2021  ?  2:31 PM 02/11/2021  ?  9:04 AM 08/14/2020  ?  8:50 AM  ?CBC  ?WBC 4.0 - 10.5 K/uL  5.8   5.7   4.2    ?Hemoglobin 12.0 - 15.0 g/dL 13.9   14.0   14.0    ?Hematocrit 36.0 - 46.0 % 41.3   41.2   42.4    ?Platelets 150 - 400 K/uL 265   308   262    ? ?CBC ?   ?Component Value Date/Time  ? WBC 5.8 08/04/2021 1431  ? WBC 3.3 (L) 05/29/2020 0904  ? RBC 4.53 08/04/2021 1431  ? HGB 13.9 08/04/2021 1431  ? HGB 13.3 03/07/2017 0842  ? HCT 41.3 08/04/2021 1431  ? HCT 39.1 03/07/2017 0842  ? PLT 265 08/04/2021 1431  ? PLT 283 03/07/2017 0842  ? MCV 91.2 08/04/2021 1431  ? MCV 91.4 03/07/2017 0842  ? MCH 30.7 08/04/2021 1431  ? MCHC 33.7 08/04/2021 1431  ? RDW 12.7 08/04/2021 1431  ? RDW 13.3 03/07/2017 0842  ? LYMPHSABS 1.5 08/04/2021 1431  ? LYMPHSABS 0.4 (L) 03/07/2017 7628  ? MONOABS 0.3 08/04/2021 1431  ? MONOABS 0.7 03/07/2017 0842  ? EOSABS 0.1 08/04/2021 1431  ? EOSABS 0.0 03/07/2017 0842  ? BASOSABS 0.1 08/04/2021 1431  ? BASOSABS 0.0 03/07/2017 0842  ? ? ?. ? ?  Latest Ref Rng & Units 02/11/2021  ?  9:04 AM 08/14/2020  ?  8:50 AM 05/29/2020  ?  9:04 AM  ?CMP  ?Glucose 70 - 99 mg/dL 108   88   80    ?BUN 8 - 23 mg/dL '16   13   15    '$ ?Creatinine 0.44 - 1.00 mg/dL 0.72   0.71   0.67    ?Sodium 135 - 145 mmol/L 144   140   140    ?Potassium 3.5 - 5.1 mmol/L 4.2   4.2   4.6    ?Chloride 98 - 111 mmol/L 110   104   103    ?CO2 22 - 32 mmol/L 26   26   33    ?Calcium 8.9 - 10.3 mg/dL 9.9   9.3   9.7    ?Total Protein 6.5 - 8.1 g/dL 6.8   6.3   6.4    ?Total Bilirubin 0.3 - 1.2 mg/dL 0.7   0.5   0.7    ?Alkaline Phos 38 - 126 U/L 95   119   91    ?AST 15 - 41 U/L '18   14   12    '$ ?ALT 0 - 44 U/L '18   17   15    '$ ? ?Component ?    Latest Ref Rng &  Units 05/07/2016  ?Hepatitis B Surface Ag ?    Negative Negative  ?Hep B Core Ab, Tot ?    Negative Negative  ?Hep C Virus Ab ?    0.0 - 0.9 s/co ratio <0.1  ?. ? ?Lab Results  ?Component Value Date  ? LDH 16

## 2021-08-10 ENCOUNTER — Encounter: Payer: Self-pay | Admitting: Hematology

## 2021-08-13 ENCOUNTER — Ambulatory Visit: Payer: Medicare Other | Admitting: Hematology

## 2021-08-13 ENCOUNTER — Other Ambulatory Visit: Payer: Medicare Other

## 2021-10-07 ENCOUNTER — Other Ambulatory Visit: Payer: Self-pay | Admitting: Oncology

## 2021-10-22 ENCOUNTER — Encounter: Payer: Medicare Other | Admitting: Family Medicine

## 2021-10-30 ENCOUNTER — Other Ambulatory Visit (HOSPITAL_COMMUNITY): Payer: Self-pay

## 2021-10-30 MED ORDER — PEG 3350-KCL-NA BICARB-NACL 420 G PO SOLR
ORAL | 0 refills | Status: AC
  Filled 2021-10-30: qty 4000, 1d supply, fill #0

## 2021-11-09 DIAGNOSIS — Z86010 Personal history of colonic polyps: Secondary | ICD-10-CM | POA: Diagnosis not present

## 2021-11-09 DIAGNOSIS — D125 Benign neoplasm of sigmoid colon: Secondary | ICD-10-CM | POA: Diagnosis not present

## 2021-11-09 DIAGNOSIS — K648 Other hemorrhoids: Secondary | ICD-10-CM | POA: Diagnosis not present

## 2021-11-09 DIAGNOSIS — D124 Benign neoplasm of descending colon: Secondary | ICD-10-CM | POA: Diagnosis not present

## 2021-11-09 DIAGNOSIS — Z09 Encounter for follow-up examination after completed treatment for conditions other than malignant neoplasm: Secondary | ICD-10-CM | POA: Diagnosis not present

## 2021-11-09 LAB — HM COLONOSCOPY

## 2021-11-11 DIAGNOSIS — D125 Benign neoplasm of sigmoid colon: Secondary | ICD-10-CM | POA: Diagnosis not present

## 2021-11-11 DIAGNOSIS — D124 Benign neoplasm of descending colon: Secondary | ICD-10-CM | POA: Diagnosis not present

## 2021-12-16 ENCOUNTER — Encounter: Payer: Self-pay | Admitting: *Deleted

## 2022-03-15 DIAGNOSIS — Z1231 Encounter for screening mammogram for malignant neoplasm of breast: Secondary | ICD-10-CM | POA: Diagnosis not present

## 2022-03-15 LAB — HM MAMMOGRAPHY

## 2022-04-06 NOTE — Progress Notes (Signed)
Subjective:   By signing my name below, I, [name], attest that this documentation has been prepared under the direction and in the presence of [name], MD., 04/08/2022.     [name], female    DOB: 02-18-1946, 76 y.o.   MRN: [id number]  Chief Complaint  Patient presents with   Annual Exam    Annual Exam    HPI Patient is in today for a comprehensive physical exam and follow up on chronic medical concerns. Denies CP/ palpitations/ SOB/ HA/ congestion/fevers/GI or GU c/o.  FHx  No changes to the family history.  Ganglion Cyst (Left Hand) Patient is inquiring about a small ganglion cyst in her left palm. Denies pain or tenderness.  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 She frequently applies Voltaren gel and Lidocaine patches which provide temporary relief.  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   Grief reaction 01/27/2016   H/O measles    as a child   History of chicken pox    as a child   History of PCOS    Hyperlipidemia, mixed 01/27/2016   Lymphadenopathy    Peripheral neuropathy 01/31/2017   Preventative health care 01/27/2016   Vitamin D deficiency    Wears glasses    Past Surgical History:  Procedure Laterality Date   COLONOSCOPY     IR GENERIC HISTORICAL  05/21/2016   IR FLUORO GUIDE PORT INSERTION RIGHT 05/21/2016 Arne Cleveland, MD [facility]   IR GENERIC HISTORICAL  05/21/2016   IR US GUIDE VASC ACCESS RIGHT 05/21/2016 Arne Cleveland, MD [facility]   IR REMOVAL TUN ACCESS W/ PORT W/O FL MOD SED  10/27/2016   SKIN BIOPSY Left    face   THYROGLOSSAL DUCT CYST N/A 04/16/2016   Procedure: THYROGLOSSAL DUCT CYST excision;  Surgeon: [name], MD;  Location: [facility];  Service: ENT;  Laterality: N/A;   Family History  Problem  Relation Age of Onset   Arthritis Mother        rheumatoid   Heart disease Father        CHF   COPD Sister        Emphysema, h/o cigarettes   Other Sister        pituatary tumor   Arthritis Sister    Obesity Sister    Cancer Paternal Grandfather        cancer   Stroke Maternal Aunt    Social History   Socioeconomic History   Marital status: Widowed    Spouse name: Not on file   Number of children: Not on file   Years of education: Not on file   Highest education level: Not on file  Occupational History   Occupation: Psychotherapist  Tobacco Use   Smoking status: Never   Smokeless tobacco: Never  Vaping Use   Vaping Use: Never used  Substance and Sexual Activity   Alcohol use: Yes    Alcohol/week: 3.0 - 6.0 standard drinks of alcohol    Types: 3 - 6 Standard drinks or equivalent per week    Comment: 5 glasses of wine a week.   Drug use: No   Sexual activity: Never    Partners: Male    Birth control/protection: None  Other Topics  Concern   Not on file  Social History Narrative   Married. Education: [employer]. Exercise: walking, yoga, and bicycling daily for 1-2 hours. Lives alone, works as Estate manager/land agent Strain: [place]  (04/20/2021)   Overall Financial Resource Strain (CARDIA)    Difficulty of Paying Living Expenses: Not hard at all  Food Insecurity: No Food Insecurity (04/20/2021)   Hunger Vital Sign    Worried About Running Out of Food in the Last Year: Never true    [place] in the Last Year: Never true  Transportation Needs: No Transportation Needs (04/20/2021)   PRAPARE - Hydrologist (Medical): No    Lack of Transportation (Non-Medical): No  Physical Activity: Sufficiently Active (04/20/2021)   Exercise Vital Sign    Days of Exercise per Week: 7 days    Minutes of Exercise per Session: 120 min  Stress: No Stress Concern Present (04/20/2021)   [place]    Feeling of Stress : Not at all  Social Connections: Moderately Integrated (04/20/2021)   Social Connection and Isolation Panel [NHANES]    Frequency of Communication with Friends and Family: More than three times a week    Frequency of Social Gatherings with Friends and Family: More than three times a week    Attends Religious Services: More than 4 times per year    Active Member of [employer] or Organizations: Not on file    Attends Archivist Meetings: More than 4 times per year    Marital Status: Widowed  Intimate Partner Violence: Not At Risk (04/20/2021)   Humiliation, Afraid, Rape, and Kick questionnaire    Fear of Current or Ex-Partner: No    Emotionally Abused: No    Physically Abused: No    Sexually Abused: No   Outpatient Medications Prior to Visit  Medication Sig Dispense Refill   Calcium-Magnesium-Vitamin D (CALCIUM MAGNESIUM PO) Take by mouth.     Cholecalciferol (VITAMIN D) 2000 units CAPS Take 2,000 Units by mouth daily.     ELDERBERRY PO Take by mouth.     Multiple Vitamins-Minerals (ZINC PO) Take by mouth.     Omega-3 Fatty Acids (OMEGA-3 PO) Take 2 capsules by mouth daily. Omega 3 1280 mg     polyethylene glycol-electrolytes (NULYTELY) 420 g solution Use as directed 4000 mL 0   vitamin E 180 MG (400 UNITS) capsule Take 400 Units by mouth daily.     No facility-administered medications prior to visit.   Allergies  Allergen Reactions   Erythromycin Other (See Comments)    Patient states that she passed out while using this medication ? SYNCOPE ?   Other Anaphylaxis    # # # CATS # # #   Review of Systems  Constitutional:  Negative for chills and fever.  HENT:  Negative for congestion.   Respiratory:  Negative for shortness of breath.   Cardiovascular:  Negative for chest pain and palpitations.  Gastrointestinal:  Negative for abdominal pain, blood in stool, constipation, diarrhea, nausea and  vomiting.  Genitourinary:  Negative for dysuria, frequency, hematuria and urgency.  Skin:           Neurological:  Negative for headaches.      Objective:    Physical Exam Constitutional:      General: She is not in acute distress.    Appearance: Normal appearance. She is normal weight.  She is not ill-appearing.  HENT:     Head: Normocephalic and atraumatic.     Right Ear: Tympanic membrane, ear canal and external ear normal.     Left Ear: Tympanic membrane, ear canal and external ear normal.     Nose: Nose normal.     Mouth/Throat:     Mouth: Mucous membranes are moist.     Pharynx: Oropharynx is clear.  Eyes:     General:        Right eye: No discharge.        Left eye: No discharge.     Extraocular Movements: Extraocular movements intact.     Right eye: No nystagmus.     Left eye: No nystagmus.     Pupils: Pupils are equal, round, and reactive to light.  Neck:     Vascular: No carotid bruit.  Cardiovascular:     Rate and Rhythm: Normal rate and regular rhythm.     Pulses: Normal pulses.     Heart sounds: Normal heart sounds. No murmur heard.    No gallop.  Pulmonary:     Effort: Pulmonary effort is normal. No respiratory distress.     Breath sounds: Normal breath sounds. No wheezing or rales.  Abdominal:     General: Bowel sounds are normal.     Palpations: Abdomen is soft.     Tenderness: There is no abdominal tenderness. There is no guarding.  Musculoskeletal:        General: Normal range of motion.     Cervical back: Normal range of motion.     Right lower leg: No edema.     Left lower leg: No edema.     Comments: Muscle strength 5/5 on upper and lower extremities.   Lymphadenopathy:     Cervical: No cervical adenopathy.  Skin:    General: Skin is warm and dry.     Comments: Ganglion cyst in the left palm.  Neurological:     Mental Status: She is alert and oriented to person, place, and time.     Sensory: Sensation is intact.     Motor: Motor function is  intact.     Coordination: Coordination is intact.     Deep Tendon Reflexes:     Reflex Scores:      Patellar reflexes are 1+ on the right side and 1+ on the left side. Psychiatric:        Mood and Affect: Mood normal.        Behavior: Behavior normal.        Judgment: Judgment normal.    BP 128/76 (BP Location: Right Arm, Patient Position: Sitting, Cuff Size: Normal)   Pulse 65   Temp 98 F (36.7 C) (Oral)   Resp 16   Ht _0  (1.753 m)   Wt 172 lb (78 kg)   SpO2 98%   BMI 25.40 kg/m  Wt Readings from Last 3 Encounters:  04/08/22 172 lb (78 kg)  08/04/21 169 lb 14.4 oz (77.1 kg)  04/20/21 161 lb (73 kg)   Diabetic Foot Exam - Simple   No data filed    Lab Results  Component Value Date   WBC 5.9 04/08/2022   HGB 14.3 04/08/2022   HCT 42.0 04/08/2022   PLT 293.0 04/08/2022   GLUCOSE 91 04/08/2022   CHOL 237 (H) 04/08/2022   TRIG 81.0 04/08/2022   HDL 88.40 04/08/2022   LDLCALC 132 (H) 04/08/2022   ALT 16 04/08/2022   AST  13 04/08/2022   NA 138 04/08/2022   K 4.7 04/08/2022   CL 102 04/08/2022   CREATININE 0.65 04/08/2022   BUN 15 04/08/2022   CO2 31 04/08/2022   TSH 1.55 05/29/2020   INR 1.00 10/27/2016   HGBA1C 5.7 04/08/2022   Lab Results  Component Value Date   TSH 1.55 05/29/2020   Lab Results  Component Value Date   WBC 5.9 04/08/2022   HGB 14.3 04/08/2022   HCT 42.0 04/08/2022   MCV 91.4 04/08/2022   PLT 293.0 04/08/2022   Lab Results  Component Value Date   NA 138 04/08/2022   K 4.7 04/08/2022   CHLORIDE 106 03/07/2017   CO2 31 04/08/2022   GLUCOSE 91 04/08/2022   BUN 15 04/08/2022   CREATININE 0.65 04/08/2022   BILITOT 0.6 04/08/2022   ALKPHOS 102 04/08/2022   AST 13 04/08/2022   ALT 16 04/08/2022   PROT 6.6 04/08/2022   ALBUMIN 4.5 04/08/2022   CALCIUM 9.6 04/08/2022   ANIONGAP 6 08/04/2021   EGFR >60 03/07/2017   GFR 85.75 04/08/2022   Lab Results  Component Value Date   CHOL 237 (H) 04/08/2022   Lab Results  Component  Value Date   HDL 88.40 04/08/2022   Lab Results  Component Value Date   LDLCALC 132 (H) 04/08/2022   Lab Results  Component Value Date   TRIG 81.0 04/08/2022   Lab Results  Component Value Date   CHOLHDL 3 04/08/2022   Lab Results  Component Value Date   HGBA1C 5.7 04/08/2022      Assessment & Plan:   Colonoscopy: Last completed on 11/09/2021.  -One 5 mm polyp in the descending colon, removed with a hot snare. Resected and retrieved. -One 2 mm polyp in the sigmoid colon, removed with a cold biopsy forceps. Resected and retrieved. -Internal hemorrhoids. -The examined portion of the ileum was normal. Repeat in 5 years.  DEXA: Last completed on 04/07/2021.  The BMD measured at AP Spine L1-L2 is 1.002 g/cm2 with a T-score of -1.4. This patient is considered OSTEOPENIC according to [facility]) criteria. L-3 & 4 was excluded due to degenerative changes. Compared with the prior study on, 01/27/2016 the BMD of the total mean shows a statistically significant decrease. The scan quality is good. Repeat in 5 years.  Mammogram: Last completed on 03/15/2022. No mammographic evidence of malignancy.  Repeat in 1 year.  Pap Smear: Last completed on 03/08/2019. Normal results. Repeat in 3-5 years.  Advanced Care Planning Patient has been provided with Advanced Care Planning documents.  Diet/Exercise Patient has been informed about eating healthy, hydrating, and meeting a minimum of 4000 daily steps.  Immunizations Patient has been informed about receiving a Tetanus immunization if injured.  Immunization History  Administered Date(s) Administered   Fluad Quad(high Dose 65+) 01/15/2019, 02/22/2022   Hepatitis A 07/17/2007, 04/04/2008   Hepatitis B 07/17/2007, 09/11/2007, 02/15/2008   Influenza Split 02/24/2013, 02/03/2021   Influenza, High Dose Seasonal PF 01/27/2016   Influenza,inj,Quad PF,6+ Mos 01/16/2014, 02/05/2015, 01/10/2017, 01/23/2018    Influenza-Unspecified 01/29/2020   PFIZER Comirnaty(Gray Top)Covid-19 Tri-Sucrose Vaccine 06/29/2020   PFIZER(Purple Top)SARS-COV-2 Vaccination 06/07/2019, 07/02/2019, 12/28/2019, 02/05/2022   Pfizer Covid-19 Vaccine Bivalent Booster 69yr & up 01/16/2021   Pneumococcal Conjugate-13 01/16/2014   Pneumococcal Polysaccharide-23 10/18/2016   Pneumococcal-Unspecified 02/12/2009   RSV,unspecified 03/08/2022   Td 10/27/2005   Tdap 02/05/2015   Typhoid Inactivated 07/17/2007   Zoster Recombinat (Shingrix) 04/11/2020, 06/12/2020   Zoster, Live 06/26/2007  Labs Routine blood work will be performed today.  Meniscus Pain Patient has been advised to apply CBD Daily cream and take black cumin/tumeric capsules to manage her chronic meniscus pain.    Problem List Items Addressed This Visit     Hyperlipidemia, mixed    Encourage heart healthy diet such as MIND or DASH diet, increase exercise, avoid trans fats, simple carbohydrates and processed foods, consider a krill or fish or flaxseed oil cap daily.       Relevant Orders   Comprehensive metabolic panel (Completed)   Lipid panel (Completed)   Vitamin D deficiency    Supplement and monitor       Relevant Orders   Vitamin D (25 hydroxy) (Completed)   Preventative health care    Mgm 03/2022 repeat in 1-2 years Dexa 04/2021 repeat in 2024 or 2025 Colonoscopy 2023 Pap Normal 2020, repeat in next 2 years.      Hyperglycemia - Primary    hgba1c acceptable, minimize simple carbs. Increase exercise as tolerated.       Relevant Orders   HgB A1c (Completed)   Muscle cramp    Hydrate and monitor       Relevant Orders   CBC with Differential/Platelet (Completed)   Magnesium (Completed)   Osteopenia    Bone density shows osteopenia, which is thinner than normal but not as bad as osteoporosis. Recommend calcium intake of 1200 to 1500 mg daily, divided into roughly 3 doses. Best source is the diet and a single dairy serving is about 500  mg, a supplement of calcium citrate once or twice daily to balance diet is fine if not getting enough in diet. Also need Vitamin D 2000 IU caps, 1 cap daily if not already taking vitamin D. Also recommend weight baring exercise on hips and upper body to keep bones strong       Right knee pain    Encouraged moist heat and gentle stretching as tolerated. May try NSAIDs and prescription meds as directed and report if symptoms worsen or seek immediate care. Can use topical rubs and bracking. Stay as active as able      Ganglion cyst    Left hand, try topical massage with lidocaine. If becomes more symptomatic could consider referral to ortho      No orders of the defined types were placed in this encounter.  I, Penni Homans, MD, personally preformed the services described in this documentation.  All medical record entries made by the scribe were at my direction and in my presence.  I have reviewed the chart and discharge instructions (if applicable) and agree that the record reflects my personal performance and is accurate and complete. 04/08/2022  I,Mohammed Iqbal,acting as a scribe for Penni Homans, MD.,have documented all relevant documentation on the behalf of Penni Homans, MD,as directed by  Penni Homans, MD while in the presence of Penni Homans, MD.  Penni Homans, MD

## 2022-04-07 DIAGNOSIS — M8580 Other specified disorders of bone density and structure, unspecified site: Secondary | ICD-10-CM | POA: Insufficient documentation

## 2022-04-07 NOTE — Assessment & Plan Note (Signed)
Supplement and monitor 

## 2022-04-07 NOTE — Assessment & Plan Note (Addendum)
Mgm 03/2022 repeat in 1-2 years Dexa 04/2021 repeat in 2024 or 2025 Colonoscopy 2023 Pap Normal 2020, repeat in next 2 years.

## 2022-04-07 NOTE — Assessment & Plan Note (Signed)

## 2022-04-07 NOTE — Assessment & Plan Note (Signed)
Encourage heart healthy diet such as MIND or DASH diet, increase exercise, avoid trans fats, simple carbohydrates and processed foods, consider a krill or fish or flaxseed oil cap daily.  °

## 2022-04-07 NOTE — Assessment & Plan Note (Signed)
hgba1c acceptable, minimize simple carbs. Increase exercise as tolerated.  

## 2022-04-07 NOTE — Assessment & Plan Note (Signed)
Hydrate and monitor 

## 2022-04-08 ENCOUNTER — Ambulatory Visit (INDEPENDENT_AMBULATORY_CARE_PROVIDER_SITE_OTHER): Payer: Medicare Other | Admitting: Family Medicine

## 2022-04-08 ENCOUNTER — Encounter: Payer: Self-pay | Admitting: Hematology

## 2022-04-08 VITALS — BP 128/76 | HR 65 | Temp 98.0°F | Resp 16 | Ht 69.0 in | Wt 172.0 lb

## 2022-04-08 DIAGNOSIS — M6740 Ganglion, unspecified site: Secondary | ICD-10-CM | POA: Diagnosis not present

## 2022-04-08 DIAGNOSIS — E782 Mixed hyperlipidemia: Secondary | ICD-10-CM

## 2022-04-08 DIAGNOSIS — E559 Vitamin D deficiency, unspecified: Secondary | ICD-10-CM

## 2022-04-08 DIAGNOSIS — Z0000 Encounter for general adult medical examination without abnormal findings: Secondary | ICD-10-CM

## 2022-04-08 DIAGNOSIS — R252 Cramp and spasm: Secondary | ICD-10-CM

## 2022-04-08 DIAGNOSIS — R739 Hyperglycemia, unspecified: Secondary | ICD-10-CM

## 2022-04-08 DIAGNOSIS — M8580 Other specified disorders of bone density and structure, unspecified site: Secondary | ICD-10-CM | POA: Diagnosis not present

## 2022-04-08 DIAGNOSIS — M25561 Pain in right knee: Secondary | ICD-10-CM | POA: Diagnosis not present

## 2022-04-08 LAB — COMPREHENSIVE METABOLIC PANEL
ALT: 16 U/L (ref 0–35)
AST: 13 U/L (ref 0–37)
Albumin: 4.5 g/dL (ref 3.5–5.2)
Alkaline Phosphatase: 102 U/L (ref 39–117)
BUN: 15 mg/dL (ref 6–23)
CO2: 31 mEq/L (ref 19–32)
Calcium: 9.6 mg/dL (ref 8.4–10.5)
Chloride: 102 mEq/L (ref 96–112)
Creatinine, Ser: 0.65 mg/dL (ref 0.40–1.20)
GFR: 85.75 mL/min (ref >60.00)
Glucose, Bld: 91 mg/dL (ref 70–99)
Potassium: 4.7 mEq/L (ref 3.5–5.1)
Sodium: 138 mEq/L (ref 135–145)
Total Bilirubin: 0.6 mg/dL (ref 0.2–1.2)
Total Protein: 6.6 g/dL (ref 6.0–8.3)

## 2022-04-08 LAB — CBC WITH DIFFERENTIAL/PLATELET
Basophils Absolute: 0 10*3/uL (ref 0.0–0.1)
Basophils Relative: 0.6 % (ref 0.0–3.0)
Eosinophils Absolute: 0.1 10*3/uL (ref 0.0–0.7)
Eosinophils Relative: 2.4 % (ref 0.0–5.0)
HCT: 42 % (ref 36.0–46.0)
Hemoglobin: 14.3 g/dL (ref 12.0–15.0)
Lymphocytes Relative: 27.4 % (ref 12.0–46.0)
Lymphs Abs: 1.6 10*3/uL (ref 0.7–4.0)
MCHC: 34 g/dL (ref 30.0–36.0)
MCV: 91.4 fl (ref 78.0–100.0)
Monocytes Absolute: 0.5 10*3/uL (ref 0.1–1.0)
Monocytes Relative: 8.8 % (ref 3.0–12.0)
Neutro Abs: 3.6 10*3/uL (ref 1.4–7.7)
Neutrophils Relative %: 60.8 % (ref 43.0–77.0)
Platelets: 293 10*3/uL (ref 150.0–400.0)
RBC: 4.6 Mil/uL (ref 3.87–5.11)
RDW: 13.4 % (ref 11.5–15.5)
WBC: 5.9 10*3/uL (ref 4.0–10.5)

## 2022-04-08 LAB — LIPID PANEL
Cholesterol: 237 mg/dL — ABNORMAL HIGH (ref 0–200)
HDL: 88.4 mg/dL (ref >39.00)
LDL Cholesterol: 132 mg/dL — ABNORMAL HIGH (ref 0–99)
NonHDL: 148.17
Total CHOL/HDL Ratio: 3
Triglycerides: 81 mg/dL (ref 0.0–149.0)
VLDL: 16.2 mg/dL (ref 0.0–40.0)

## 2022-04-08 LAB — HEMOGLOBIN A1C: Hgb A1c MFr Bld: 5.7 % (ref 4.6–6.5)

## 2022-04-08 LAB — MAGNESIUM: Magnesium: 2 mg/dL (ref 1.5–2.5)

## 2022-04-08 LAB — VITAMIN D 25 HYDROXY (VIT D DEFICIENCY, FRACTURES): VITD: 23.41 ng/mL — ABNORMAL LOW (ref 30.00–100.00)

## 2022-04-08 NOTE — Patient Instructions (Addendum)
CBD Daily, extra strength, mint, website [employer]  Turmeric/Curcumin consider Black Cumin Oil  Ganglion Cyst  A ganglion cyst is a non-cancerous, fluid-filled lump of tissue that occurs near a joint, tendon, or ligament. The cyst grows out of a joint or the lining of a tendon or ligament. Ganglion cysts most often develop in the hand or wrist, but they can also develop in the shoulder, elbow, hip, knee, ankle, or foot. Ganglion cysts are ball-shaped or egg-shaped. Their size can range from the size of a pea to larger than a grape. Increased activity may cause the cyst to get bigger because more fluid starts to build up. What are the causes? The exact cause of this condition is not known, but it may be related to: Inflammation or irritation around the joint. An injury or tear in the layers of tissue around the joint (joint capsule). Repetitive movements or overuse. History of acute or repeated injury. What increases the risk? You are more likely to develop this condition if: You are a female. You are 67-51 years old. What are the signs or symptoms? The main symptom of this condition is a lump. It most often appears on the hand or wrist. In many cases, there are no other symptoms, but a cyst can sometimes cause: Tingling. Pain or tenderness. Numbness. Weakness or loss of strength in the affected joint. Decreased range of motion in the affected area of the body. How is this diagnosed? Ganglion cysts are usually diagnosed based on a physical exam. Your health care provider will feel the lump and may shine a light next to it. If it is a ganglion cyst, the light will likely shine through it. Your health care provider may order an X-ray, ultrasound, MRI, or CT scan to rule out other conditions. How is this treated? Ganglion cysts often go away on their own without treatment. If you have pain or other symptoms, treatment may be needed. Treatment is also needed if the ganglion cyst limits  your movement or if it gets infected. Treatment may include: Wearing a brace or splint on your wrist or finger. Taking anti-inflammatory medicine. Having fluid drained from the lump with a needle (aspiration). Getting an injection of medicine into the joint to decrease inflammation. This may be corticosteroids, ethanol, or hyaluronidase. Having surgery to remove the ganglion cyst. Placing a pad in your shoe or wearing shoes that will not rub against the cyst if it is on your foot. Follow these instructions at home: Do not press on the ganglion cyst, poke it with a needle, or hit it. Take over-the-counter and prescription medicines only as told by your health care provider. If you have a brace or splint: Wear it as told by your health care provider. Remove it as told by your health care provider. Ask if you need to remove it when you take a shower or a bath. Watch your ganglion cyst for any changes. Keep all follow-up visits as told by your health care provider. This is important. Contact a health care provider if: Your ganglion cyst becomes larger or more painful. You have pus coming from the lump. You have weakness or numbness in the affected area. You have a fever or chills. Get help right away if: You have a fever and have any of these in the cyst area: Increased redness. Red streaks. Swelling. Summary A ganglion cyst is a non-cancerous, fluid-filled lump that occurs near a joint, tendon, or ligament. Ganglion cysts most often develop in the hand  or wrist, but they can also develop in the shoulder, elbow, hip, knee, ankle, or foot. Ganglion cysts often go away on their own without treatment. This information is not intended to replace advice given to you by your health care provider. Make sure you discuss any questions you have with your health care provider. Document Revised: 07/11/2019 Document Reviewed: 07/11/2019 Elsevier Patient Education  [place]  65 Years and Older, Female Preventive care refers to lifestyle choices and visits with your health care provider that can promote health and wellness. Preventive care visits are also called wellness exams. What can I expect for my preventive care visit? Counseling Your health care provider may ask you questions about your: Medical history, including: Past medical problems. Family medical history. Pregnancy and menstrual history. History of falls. Current health, including: Memory and ability to understand (cognition). Emotional well-being. Home life and relationship well-being. Sexual activity and sexual health. Lifestyle, including: Alcohol, nicotine or tobacco, and drug use. Access to firearms. Diet, exercise, and sleep habits. Work and work Statistician. Sunscreen use. Safety issues such as seatbelt and bike helmet use. Physical exam Your health care provider will check your: Height and weight. These may be used to calculate your BMI (body mass index). BMI is a measurement that tells if you are at a healthy weight. Waist circumference. This measures the distance around your waistline. This measurement also tells if you are at a healthy weight and may help predict your risk of certain diseases, such as type 2 diabetes and high blood pressure. Heart rate and blood pressure. Body temperature. Skin for abnormal spots. What immunizations do I need?  Vaccines are usually given at various ages, according to a schedule. Your health care provider will recommend vaccines for you based on your age, medical history, and lifestyle or other factors, such as travel or where you work. What tests do I need? Screening Your health care provider may recommend screening tests for certain conditions. This may include: Lipid and cholesterol levels. Hepatitis C test. Hepatitis B test. HIV (human immunodeficiency virus) test. STI (sexually transmitted infection) testing, if you are at risk. Lung  cancer screening. Colorectal cancer screening. Diabetes screening. This is done by checking your blood sugar (glucose) after you have not eaten for a while (fasting). Mammogram. Talk with your health care provider about how often you should have regular mammograms. BRCA-related cancer screening. This may be done if you have a family history of breast, ovarian, tubal, or peritoneal cancers. Bone density scan. This is done to screen for osteoporosis. Talk with your health care provider about your test results, treatment options, and if necessary, the need for more tests. Follow these instructions at home: Eating and drinking  Eat a diet that includes fresh fruits and vegetables, whole grains, lean protein, and low-fat dairy products. Limit your intake of foods with high amounts of sugar, saturated fats, and salt. Take vitamin and mineral supplements as recommended by your health care provider. Do not drink alcohol if your health care provider tells you not to drink. If you drink alcohol: Limit how much you have to 0-1 drink a day. Know how much alcohol is in your drink. In the [place] one drink equals one 12 oz bottle of beer (355 mL), one 5 oz glass of wine (148 mL), or one 1 oz glass of hard liquor (44 mL). Lifestyle Brush your teeth every morning and night with fluoride toothpaste. Floss one time each day. Exercise for at least 30  minutes 5 or more days each week. Do not use any products that contain nicotine or tobacco. These products include cigarettes, chewing tobacco, and vaping devices, such as e-cigarettes. If you need help quitting, ask your health care provider. Do not use drugs. If you are sexually active, practice safe sex. Use a condom or other form of protection in order to prevent STIs. Take aspirin only as told by your health care provider. Make sure that you understand how much to take and what form to take. Work with your health care provider to find out whether it is safe and  beneficial for you to take aspirin daily. Ask your health care provider if you need to take a cholesterol-lowering medicine (statin). Find healthy ways to manage stress, such as: Meditation, yoga, or listening to music. Journaling. Talking to a trusted person. Spending time with friends and family. Minimize exposure to UV radiation to reduce your risk of skin cancer. Safety Always wear your seat belt while driving or riding in a vehicle. Do not drive: If you have been drinking alcohol. Do not ride with someone who has been drinking. When you are tired or distracted. While texting. If you have been using any mind-altering substances or drugs. Wear a helmet and other protective equipment during sports activities. If you have firearms in your house, make sure you follow all gun safety procedures. What's next? Visit your health care provider once a year for an annual wellness visit. Ask your health care provider how often you should have your eyes and teeth checked. Stay up to date on all vaccines. This information is not intended to replace advice given to you by your health care provider. Make sure you discuss any questions you have with your health care provider. Document Revised: 10/15/2020 Document Reviewed: 10/15/2020 Elsevier Patient Education  [place].

## 2022-04-09 ENCOUNTER — Other Ambulatory Visit (HOSPITAL_COMMUNITY): Payer: Self-pay

## 2022-04-09 ENCOUNTER — Other Ambulatory Visit: Payer: Self-pay

## 2022-04-09 MED ORDER — VITAMIN D (ERGOCALCIFEROL) 1.25 MG (50000 UNIT) PO CAPS
50000.0000 [IU] | ORAL_CAPSULE | ORAL | 5 refills | Status: DC
  Filled 2022-04-09: qty 5, 35d supply, fill #0
  Filled 2022-05-09: qty 5, 35d supply, fill #1
  Filled 2022-06-11: qty 5, 35d supply, fill #2
  Filled 2022-07-10: qty 5, 35d supply, fill #3
  Filled 2022-08-19: qty 5, 35d supply, fill #4
  Filled 2022-09-20: qty 5, 35d supply, fill #5

## 2022-04-10 DIAGNOSIS — M6740 Ganglion, unspecified site: Secondary | ICD-10-CM | POA: Insufficient documentation

## 2022-04-10 DIAGNOSIS — M25561 Pain in right knee: Secondary | ICD-10-CM | POA: Insufficient documentation

## 2022-04-10 NOTE — Assessment & Plan Note (Signed)
Left hand, try topical massage with lidocaine. If becomes more symptomatic could consider referral to ortho

## 2022-04-10 NOTE — Assessment & Plan Note (Signed)
Encouraged moist heat and gentle stretching as tolerated. May try NSAIDs and prescription meds as directed and report if symptoms worsen or seek immediate care. Can use topical rubs and bracking. Stay as active as able

## 2022-04-22 ENCOUNTER — Ambulatory Visit (INDEPENDENT_AMBULATORY_CARE_PROVIDER_SITE_OTHER): Payer: Medicare Other | Admitting: *Deleted

## 2022-04-22 DIAGNOSIS — Z0000 Encounter for general adult medical examination without abnormal findings: Secondary | ICD-10-CM

## 2022-04-22 NOTE — Patient Instructions (Signed)
[name] , Thank you for taking time to come for your Medicare Wellness Visit. I appreciate your ongoing commitment to your health goals. Please review the following plan we discussed and let me know if I can assist you in the future.   These are the goals we discussed:  Goals       continue being socially and physically active (pt-stated)        This is a list of the screening recommended for you and due dates:  Health Maintenance  Topic Date Due   COVID-19 Vaccine (7 - 2023-24 season) 04/30/2022*   Mammogram  03/16/2023   DEXA scan (bone density measurement)  04/08/2023   Medicare Annual Wellness Visit  04/23/2023   DTaP/Tdap/Td vaccine (3 - Td or Tdap) 02/04/2025   Colon Cancer Screening  11/10/2026   Pneumonia Vaccine  Completed   Flu Shot  Completed   Hepatitis C Screening: USPSTF Recommendation to screen - Ages 18-79 yo.  Completed   Zoster (Shingles) Vaccine  Completed   HPV Vaccine  Aged Out  *Topic was postponed. The date shown is not the original due date.     Next appointment: Follow up in one year for your annual wellness visit    Preventive Care 65 Years and Older, Female Preventive care refers to lifestyle choices and visits with your health care provider that can promote health and wellness. What does preventive care include? A yearly physical exam. This is also called an annual well check. Dental exams once or twice a year. Routine eye exams. Ask your health care provider how often you should have your eyes checked. Personal lifestyle choices, including: Daily care of your teeth and gums. Regular physical activity. Eating a healthy diet. Avoiding tobacco and drug use. Limiting alcohol use. Practicing safe sex. Taking low-dose aspirin every day. Taking vitamin and mineral supplements as recommended by your health care provider. What happens during an annual well check? The services and screenings done by your health care provider during your annual  well check will depend on your age, overall health, lifestyle risk factors, and family history of disease. Counseling  Your health care provider may ask you questions about your: Alcohol use. Tobacco use. Drug use. Emotional well-being. Home and relationship well-being. Sexual activity. Eating habits. History of falls. Memory and ability to understand (cognition). Work and work Statistician. Reproductive health. Screening  You may have the following tests or measurements: Height, weight, and BMI. Blood pressure. Lipid and cholesterol levels. These may be checked every 5 years, or more frequently if you are over 2 years old. Skin check. Lung cancer screening. You may have this screening every year starting at age 23 if you have a 30-pack-year history of smoking and currently smoke or have quit within the past 15 years. Fecal occult blood test (FOBT) of the stool. You may have this test every year starting at age 17. Flexible sigmoidoscopy or colonoscopy. You may have a sigmoidoscopy every 5 years or a colonoscopy every 10 years starting at age 33. Hepatitis C blood test. Hepatitis B blood test. Sexually transmitted disease (STD) testing. Diabetes screening. This is done by checking your blood sugar (glucose) after you have not eaten for a while (fasting). You may have this done every 1-3 years. Bone density scan. This is done to screen for osteoporosis. You may have this done starting at age 16. Mammogram. This may be done every 1-2 years. Talk to your health care provider about how often you should have  regular mammograms. Talk with your health care provider about your test results, treatment options, and if necessary, the need for more tests. Vaccines  Your health care provider may recommend certain vaccines, such as: Influenza vaccine. This is recommended every year. Tetanus, diphtheria, and acellular pertussis (Tdap, Td) vaccine. You may need a Td booster every 10 years. Zoster  vaccine. You may need this after age 53. Pneumococcal 13-valent conjugate (PCV13) vaccine. One dose is recommended after age 32. Pneumococcal polysaccharide (PPSV23) vaccine. One dose is recommended after age 37. Talk to your health care provider about which screenings and vaccines you need and how often you need them. This information is not intended to replace advice given to you by your health care provider. Make sure you discuss any questions you have with your health care provider. Document Released: 05/16/2015 Document Revised: 01/07/2016 Document Reviewed: 02/18/2015 Elsevier Interactive Patient Education  2017 [place] Prevention in the Home Falls can cause injuries. They can happen to people of all ages. There are many things you can do to make your home safe and to help prevent falls. What can I do on the outside of my home? Regularly fix the edges of walkways and driveways and fix any cracks. Remove anything that might make you trip as you walk through a door, such as a raised step or threshold. Trim any bushes or trees on the path to your home. Use bright outdoor lighting. Clear any walking paths of anything that might make someone trip, such as rocks or tools. Regularly check to see if handrails are loose or broken. Make sure that both sides of any steps have handrails. Any raised decks and porches should have guardrails on the edges. Have any leaves, snow, or ice cleared regularly. Use sand or salt on walking paths during winter. Clean up any spills in your garage right away. This includes oil or grease spills. What can I do in the bathroom? Use night lights. Install grab bars by the toilet and in the tub and shower. Do not use towel bars as grab bars. Use non-skid mats or decals in the tub or shower. If you need to sit down in the shower, use a plastic, non-slip stool. Keep the floor dry. Clean up any water that spills on the floor as soon as it happens. Remove  soap buildup in the tub or shower regularly. Attach bath mats securely with double-sided non-slip rug tape. Do not have throw rugs and other things on the floor that can make you trip. What can I do in the bedroom? Use night lights. Make sure that you have a light by your bed that is easy to reach. Do not use any sheets or blankets that are too big for your bed. They should not hang down onto the floor. Have a firm chair that has side arms. You can use this for support while you get dressed. Do not have throw rugs and other things on the floor that can make you trip. What can I do in the kitchen? Clean up any spills right away. Avoid walking on wet floors. Keep items that you use a lot in easy-to-reach places. If you need to reach something above you, use a strong step stool that has a grab bar. Keep electrical cords out of the way. Do not use floor polish or wax that makes floors slippery. If you must use wax, use non-skid floor wax. Do not have throw rugs and other things on the floor that  can make you trip. What can I do with my stairs? Do not leave any items on the stairs. Make sure that there are handrails on both sides of the stairs and use them. Fix handrails that are broken or loose. Make sure that handrails are as long as the stairways. Check any carpeting to make sure that it is firmly attached to the stairs. Fix any carpet that is loose or worn. Avoid having throw rugs at the top or bottom of the stairs. If you do have throw rugs, attach them to the floor with carpet tape. Make sure that you have a light switch at the top of the stairs and the bottom of the stairs. If you do not have them, ask someone to add them for you. What else can I do to help prevent falls? Wear shoes that: Do not have high heels. Have rubber bottoms. Are comfortable and fit you well. Are closed at the toe. Do not wear sandals. If you use a stepladder: Make sure that it is fully opened. Do not climb a  closed stepladder. Make sure that both sides of the stepladder are locked into place. Ask someone to hold it for you, if possible. Clearly mark and make sure that you can see: Any grab bars or handrails. First and last steps. Where the edge of each step is. Use tools that help you move around (mobility aids) if they are needed. These include: Canes. Walkers. Scooters. Crutches. Turn on the lights when you go into a dark area. Replace any light bulbs as soon as they burn out. Set up your furniture so you have a clear path. Avoid moving your furniture around. If any of your floors are uneven, fix them. If there are any pets around you, be aware of where they are. Review your medicines with your doctor. Some medicines can make you feel dizzy. This can increase your chance of falling. Ask your doctor what other things that you can do to help prevent falls. This information is not intended to replace advice given to you by your health care provider. Make sure you discuss any questions you have with your health care provider. Document Released: 02/13/2009 Document Revised: 09/25/2015 Document Reviewed: 05/24/2014 Elsevier Interactive Patient Education  2017 [employer].

## 2022-04-22 NOTE — Progress Notes (Signed)
Subjective:   Lindsay Harrison is a 76 y.o. female who presents for Medicare Annual (Subsequent) preventive examination.  I connected with  Lindsay Harrison on 04/22/22 by a audio enabled telemedicine application and verified that I am speaking with the correct person using two identifiers.  Patient Location: Home  Provider Location: Office/Clinic  I discussed the limitations of evaluation and management by telemedicine. The patient expressed understanding and agreed to proceed.   Review of Systems    Defer to PCP Cardiac Risk Factors include: advanced age (>20mn, >>71women);dyslipidemia     Objective:    There were no vitals filed for this visit. There is no height or weight on file to calculate BMI.     04/22/2022    8:26 AM 04/20/2021    8:25 AM 04/10/2020   12:44 PM 11/26/2019    3:18 PM 09/24/2019    9:51 AM 07/23/2019    8:37 AM 05/21/2019    9:19 AM  Advanced Directives  Does Patient Have a Medical Advance Directive? _0  Yes Yes  Type of AParamedicof AWaynes[place]Living will HC[place]Living will HKey Colony BeachLiving will HScurryLiving will HUnion CityLiving will HDes PlainesLiving will HEsteroLiving will  Does patient want to make changes to medical advance directive?    No - Patient declined No - Patient declined No - Patient declined No - Patient declined  Copy of [place]ake [place] Chart? No - copy requested Yes - validated most recent copy scanned in chart (See row information) Yes - validated most recent copy scanned in chart (See row information) No - copy requested Yes - validated most recent copy scanned in chart (See row information)  Yes - validated most recent copy scanned in chart (See row information)    Current Medications (verified) Outpatient Encounter Medications as of 04/22/2022  Medication Sig    Vitamin D, Ergocalciferol, (DRISDOL) 1.25 MG (50000 UNIT) CAPS capsule Take 1 capsule by mouth every 7 days.   Calcium-Magnesium-Vitamin D (CALCIUM MAGNESIUM PO) Take by mouth.   Cholecalciferol (VITAMIN D) 2000 units CAPS Take 2,000 Units by mouth daily.   ELDERBERRY PO Take by mouth.   Multiple Vitamins-Minerals (ZINC PO) Take by mouth.   Omega-3 Fatty Acids (OMEGA-3 PO) Take 2 capsules by mouth daily. Omega 3 1280 mg   polyethylene glycol-electrolytes (NULYTELY) 420 g solution Use as directed   vitamin E 180 MG (400 UNITS) capsule Take 400 Units by mouth daily.   No facility-administered encounter medications on file as of 04/22/2022.    Allergies (verified) Erythromycin and Other   History: Past Medical History:  Diagnosis Date   Anemia    h/o iron deficiency secondary to heavy menses   Arthritis    left hand index finger   Diffuse large B cell lymphoma (HEldon dx'd 04/2016   Grief reaction 01/27/2016   H/O measles    as a child   History of chicken pox    as a child   History of PCOS    Hyperlipidemia, mixed 01/27/2016   Lymphadenopathy    Peripheral neuropathy 01/31/2017   Preventative health care 01/27/2016   Vitamin D deficiency    Wears glasses    Past Surgical History:  Procedure Laterality Date   COLONOSCOPY     IR GENERIC HISTORICAL  05/21/2016   IR FLUORO GUIDE PORT INSERTION RIGHT 05/21/2016 DArne Cleveland MD [facility]  IR GENERIC HISTORICAL  05/21/2016   IR US GUIDE VASC ACCESS RIGHT 05/21/2016 Arne Cleveland, MD [facility]   IR REMOVAL TUN ACCESS W/ PORT W/O FL MOD SED  10/27/2016   SKIN BIOPSY Left    face   THYROGLOSSAL DUCT CYST N/A 04/16/2016   Procedure: THYROGLOSSAL DUCT CYST excision;  Surgeon: [name], MD;  Location[facility];  Service: ENT;  Laterality: N/A;   Family History  Problem Relation Age of Onset   Arthritis Mother        rheumatoid   Heart disease Father        CHF   COPD Sister        Emphysema, h/o cigarettes   Other  Sister        pituatary tumor   Arthritis Sister    Obesity Sister    Cancer Paternal Grandfather        cancer   Stroke Maternal Aunt    Social History   Socioeconomic History   Marital status: Widowed    Spouse name: Not on file   Number of children: Not on file   Years of education: Not on file   Highest education level: Not on file  Occupational History   Occupation: Psychotherapist  Tobacco Use   Smoking status: Never   Smokeless tobacco: Never  Vaping Use   Vaping Use: Never used  Substance and Sexual Activity   Alcohol use: Yes    Alcohol/week: 3.0 - 6.0 standard drinks of alcohol    Types: 3 - 6 Standard drinks or equivalent per week    Comment: 5 glasses of wine a week.   Drug use: No   Sexual activity: Never    Partners: Male    Birth control/protection: None  Other Topics Concern   Not on file  Social History Narrative   Married. Education: [employer]. Exercise: walking, yoga, and bicycling daily for 1-2 hours. Lives alone, works as Estate manager/land agent Strain: Maish Vaya  (04/20/2021)   Overall Financial Resource Strain (CARDIA)    Difficulty of Paying Living Expenses: Not hard at all  Food Insecurity: No Food Insecurity (04/22/2022)   Hunger Vital Sign    Worried About Running Out of Food in the Last Year: Never true    [place] in the Last Year: Never true  Transportation Needs: No Transportation Needs (04/22/2022)   PRAPARE - Hydrologist (Medical): No    Lack of Transportation (Non-Medical): No  Physical Activity: Sufficiently Active (04/20/2021)   Exercise Vital Sign    Days of Exercise per Week: 7 days    Minutes of Exercise per Session: 120 min  Stress: No Stress Concern Present (04/20/2021)   [place]    Feeling of Stress : Not at all  Social Connections: Moderately Integrated (04/20/2021)    Social Connection and Isolation Panel [NHANES]    Frequency of Communication with Friends and Family: More than three times a week    Frequency of Social Gatherings with Friends and Family: More than three times a week    Attends Religious Services: More than 4 times per year    Active Member of [employer] or Organizations: Not on file    Attends Archivist Meetings: More than 4 times per year    Marital Status: Widowed    Tobacco Counseling Counseling given: Not Answered   Clinical Intake:  Pre-visit preparation completed: Yes  Pain : No/denies pain  Diabetes: No  How often do you need to have someone help you when you read instructions, pamphlets, or other written materials from your doctor or pharmacy?: 1 - Never  Activities of Daily Living    04/22/2022    8:29 AM  In your present state of health, do you have any difficulty performing the following activities:  Hearing? 0  Vision? 0  Difficulty concentrating or making decisions? 0  Walking or climbing stairs? 0  Dressing or bathing? 0  Doing errands, shopping? 0  Preparing Food and eating ? N  Using the Toilet? N  In the past six months, have you accidently leaked urine? Y  Do you have problems with loss of bowel control? N  Managing your Medications? N  Managing your Finances? N  Housekeeping or managing your Housekeeping? N    Patient Care Team: [name], MD as PCP - General (Family Medicine) Mammography, [facility] (Diagnostic Radiology)  Indicate any recent Medical Services you may have received from other than [facility] providers in the past year (date may be approximate).     Assessment:   This is a routine wellness examination for [place].  Hearing/Vision screen No results found.  Dietary issues and exercise activities discussed: Current Exercise Habits: Home exercise routine, Type of exercise: walking;yoga;Other - see comments (Tai chi), Time (Minutes): > 60, Frequency (Times/Week): 7, Weekly  Exercise (Minutes/Week): 0, Intensity: Moderate, Exercise limited by: None identified   Goals Addressed   None    Depression Screen    04/22/2022    8:29 AM 04/08/2022   10:08 AM 04/20/2021    8:30 AM 04/10/2020   12:47 PM 02/15/2019    1:20 PM 07/13/2018    2:00 PM 02/02/2018    1:18 PM  PHQ 2/9 Scores  PHQ - 2 Score 0 0 0 0 0 0 0  PHQ- 9 Score  0         Fall Risk    04/22/2022    8:26 AM 04/08/2022   10:07 AM 04/20/2021    8:27 AM 04/10/2020   12:46 PM 02/15/2019    1:20 PM  [place] in the past year? 0 0 0 0 0  Number falls in past yr: 0 0 0 0 0  Injury with Fall? 0 0 0 0 0  Risk for fall due to : No Fall Risks      Follow up Falls evaluation completed Falls evaluation completed Falls prevention discussed Falls prevention discussed     FALL RISK PREVENTION PERTAINING TO THE HOME:  Any stairs in or around the home? Yes  If so, are there any without handrails? No  Home free of loose throw rugs in walkways, pet beds, electrical cords, etc? Yes  Adequate lighting in your home to reduce risk of falls? Yes   ASSISTIVE DEVICES UTILIZED TO PREVENT FALLS:  Life alert? No  Use of a cane, walker or w/c? No  Grab bars in the bathroom? Yes  Shower chair or bench in shower? Yes  Elevated toilet seat or a handicapped toilet? Yes   TIMED UP AND GO:  Was the test performed?  No, audio visit .    Cognitive Function:        04/22/2022    8:32 AM  6CIT Screen  What Year? 0 points  What month? 0 points  What time? 0 points  Count back from 20 0 points  Months  in reverse 0 points  Repeat phrase 0 points  Total Score 0 points    Immunizations Immunization History  Administered Date(s) Administered   Fluad Quad(high Dose 65+) 01/15/2019, 02/22/2022   Hepatitis A 07/17/2007, 04/04/2008   Hepatitis B 07/17/2007, 09/11/2007, 02/15/2008   Influenza Split 02/24/2013, 02/03/2021   Influenza, High Dose Seasonal PF 01/27/2016   Influenza,inj,Quad PF,6+ Mos  01/16/2014, 02/05/2015, 01/10/2017, 01/23/2018   Influenza-Unspecified 01/29/2020   PFIZER Comirnaty(Gray Top)Covid-19 Tri-Sucrose Vaccine 06/29/2020   PFIZER(Purple Top)SARS-COV-2 Vaccination 06/07/2019, 07/02/2019, 12/28/2019, 02/05/2022   Pfizer Covid-19 Vaccine Bivalent Booster 68yr & up 01/16/2021   Pneumococcal Conjugate-13 01/16/2014   Pneumococcal Polysaccharide-23 10/18/2016   Pneumococcal-Unspecified 02/12/2009   RSV,unspecified 03/08/2022   Td 10/27/2005   Tdap 02/05/2015   Typhoid Inactivated 07/17/2007   Zoster Recombinat (Shingrix) 04/11/2020, 06/12/2020   Zoster, Live 06/26/2007    TDAP status: Up to date  Flu Vaccine status: Up to date  Pneumococcal vaccine status: Up to date  Covid-19 vaccine status: Information provided on how to obtain vaccines.   Qualifies for Shingles Vaccine? Yes   Zostavax completed Yes   Shingrix Completed?: Yes  Screening Tests Health Maintenance  Topic Date Due   Medicare Annual Wellness (AWV)  04/20/2022   COVID-19 Vaccine (7 - 2023-24 season) 04/30/2022 (Originally 04/02/2022)   MAMMOGRAM  03/16/2023   DEXA SCAN  04/08/2023   DTaP/Tdap/Td (3 - Td or Tdap) 02/04/2025   COLONOSCOPY (Pts 45-481yrInsurance coverage will need to be confirmed)  11/10/2026   Pneumonia Vaccine 6524Years old  Completed   INFLUENZA VACCINE  Completed   Hepatitis C Screening  Completed   Zoster Vaccines- Shingrix  Completed   HPV VACCINES  Aged Out    Health Maintenance  Health Maintenance Due  Topic Date Due   Medicare Annual Wellness (AWV)  04/20/2022    Colorectal cancer screening: Type of screening: Colonoscopy. Completed 11/09/21. Repeat every 5 years  Mammogram status: Completed 03/15/22. Repeat every year  Bone Density status: Completed 04/07/21. Results reflect: Bone density results: OSTEOPENIA. Repeat every 2 years.  Lung Cancer Screening: (Low Dose CT Chest recommended if Age 76-80ears, 30 pack-year currently smoking OR have quit  w/in 15years.) does not qualify.   Additional Screening:  Hepatitis C Screening: does qualify; Completed 05/07/16  Vision Screening: Recommended annual ophthalmology exams for early detection of glaucoma and other disorders of the eye. Is the patient up to date with their annual eye exam?  Yes  Who is the provider or what is the name of the office in which the patient attends annual eye exams? Dr. GrKaty Apof pt is not established with a provider, would they like to be referred to a provider to establish care? No .   Dental Screening: Recommended annual dental exams for proper oral hygiene  Community Resource Referral / Chronic Care Management: CRR required this visit?  No   CCM required this visit?  No      Plan:     I have personally reviewed and noted the following in the patient's chart:   Medical and social history Use of alcohol, tobacco or illicit drugs  Current medications and supplements including opioid prescriptions. Patient is not currently taking opioid prescriptions. Functional ability and status Nutritional status Physical activity Advanced directives List of other physicians Hospitalizations, surgeries, and ER visits in previous 12 months Vitals Screenings to include cognitive, depression, and falls Referrals and appointments  In addition, I have reviewed and discussed with patient certain preventive protocols, quality  metrics, and best practice recommendations. A written personalized care plan for preventive services as well as general preventive health recommendations were provided to patient.   Due to this being a telephonic visit, the after visit summary with patients personalized plan was offered to patient via mail or my-chart. Patient would like to access on my-chart.   Beatris Ship, [place]   04/22/2022   Nurse Notes: None

## 2022-06-11 ENCOUNTER — Other Ambulatory Visit (HOSPITAL_COMMUNITY): Payer: Self-pay

## 2022-07-01 DIAGNOSIS — H5213 Myopia, bilateral: Secondary | ICD-10-CM | POA: Diagnosis not present

## 2022-07-01 DIAGNOSIS — H524 Presbyopia: Secondary | ICD-10-CM | POA: Diagnosis not present

## 2022-07-01 DIAGNOSIS — H2513 Age-related nuclear cataract, bilateral: Secondary | ICD-10-CM | POA: Diagnosis not present

## 2022-07-01 DIAGNOSIS — H52203 Unspecified astigmatism, bilateral: Secondary | ICD-10-CM | POA: Diagnosis not present

## 2022-07-10 ENCOUNTER — Other Ambulatory Visit (HOSPITAL_COMMUNITY): Payer: Self-pay

## 2022-08-09 ENCOUNTER — Other Ambulatory Visit: Payer: Self-pay

## 2022-08-09 DIAGNOSIS — C8330 Diffuse large B-cell lymphoma, unspecified site: Secondary | ICD-10-CM

## 2022-08-10 ENCOUNTER — Inpatient Hospital Stay (HOSPITAL_BASED_OUTPATIENT_CLINIC_OR_DEPARTMENT_OTHER): Payer: Medicare Other | Admitting: Hematology

## 2022-08-10 ENCOUNTER — Inpatient Hospital Stay: Payer: Medicare Other | Attending: Hematology

## 2022-08-10 VITALS — BP 136/76 | HR 70 | Temp 97.2°F | Resp 20 | Wt 168.4 lb

## 2022-08-10 DIAGNOSIS — Z8261 Family history of arthritis: Secondary | ICD-10-CM | POA: Diagnosis not present

## 2022-08-10 DIAGNOSIS — Z825 Family history of asthma and other chronic lower respiratory diseases: Secondary | ICD-10-CM | POA: Insufficient documentation

## 2022-08-10 DIAGNOSIS — T451X5A Adverse effect of antineoplastic and immunosuppressive drugs, initial encounter: Secondary | ICD-10-CM | POA: Insufficient documentation

## 2022-08-10 DIAGNOSIS — Z8349 Family history of other endocrine, nutritional and metabolic diseases: Secondary | ICD-10-CM | POA: Diagnosis not present

## 2022-08-10 DIAGNOSIS — R499 Unspecified voice and resonance disorder: Secondary | ICD-10-CM

## 2022-08-10 DIAGNOSIS — G620 Drug-induced polyneuropathy: Secondary | ICD-10-CM | POA: Diagnosis not present

## 2022-08-10 DIAGNOSIS — Z8249 Family history of ischemic heart disease and other diseases of the circulatory system: Secondary | ICD-10-CM | POA: Diagnosis not present

## 2022-08-10 DIAGNOSIS — Z7289 Other problems related to lifestyle: Secondary | ICD-10-CM | POA: Insufficient documentation

## 2022-08-10 DIAGNOSIS — Z823 Family history of stroke: Secondary | ICD-10-CM | POA: Diagnosis not present

## 2022-08-10 DIAGNOSIS — Z79899 Other long term (current) drug therapy: Secondary | ICD-10-CM | POA: Diagnosis not present

## 2022-08-10 DIAGNOSIS — Z809 Family history of malignant neoplasm, unspecified: Secondary | ICD-10-CM | POA: Diagnosis not present

## 2022-08-10 DIAGNOSIS — E785 Hyperlipidemia, unspecified: Secondary | ICD-10-CM | POA: Insufficient documentation

## 2022-08-10 DIAGNOSIS — E559 Vitamin D deficiency, unspecified: Secondary | ICD-10-CM | POA: Insufficient documentation

## 2022-08-10 DIAGNOSIS — I700 Atherosclerosis of aorta: Secondary | ICD-10-CM | POA: Insufficient documentation

## 2022-08-10 DIAGNOSIS — C8338 Diffuse large B-cell lymphoma, lymph nodes of multiple sites: Secondary | ICD-10-CM | POA: Diagnosis not present

## 2022-08-10 DIAGNOSIS — C8330 Diffuse large B-cell lymphoma, unspecified site: Secondary | ICD-10-CM

## 2022-08-10 LAB — CBC WITH DIFFERENTIAL (CANCER CENTER ONLY)
Abs Immature Granulocytes: 0.01 10*3/uL (ref 0.00–0.07)
Basophils Absolute: 0 10*3/uL (ref 0.0–0.1)
Basophils Relative: 1 %
Eosinophils Absolute: 0.1 10*3/uL (ref 0.0–0.5)
Eosinophils Relative: 3 %
HCT: 41.9 % (ref 36.0–46.0)
Hemoglobin: 14.1 g/dL (ref 12.0–15.0)
Immature Granulocytes: 0 %
Lymphocytes Relative: 29 %
Lymphs Abs: 1.4 10*3/uL (ref 0.7–4.0)
MCH: 31 pg (ref 26.0–34.0)
MCHC: 33.7 g/dL (ref 30.0–36.0)
MCV: 92.1 fL (ref 80.0–100.0)
Monocytes Absolute: 0.4 10*3/uL (ref 0.1–1.0)
Monocytes Relative: 9 %
Neutro Abs: 2.7 10*3/uL (ref 1.7–7.7)
Neutrophils Relative %: 58 %
Platelet Count: 269 10*3/uL (ref 150–400)
RBC: 4.55 MIL/uL (ref 3.87–5.11)
RDW: 12.8 % (ref 11.5–15.5)
WBC Count: 4.7 10*3/uL (ref 4.0–10.5)
nRBC: 0 % (ref 0.0–0.2)

## 2022-08-10 LAB — CMP (CANCER CENTER ONLY)
ALT: 14 U/L (ref 0–44)
AST: 14 U/L — ABNORMAL LOW (ref 15–41)
Albumin: 4.2 g/dL (ref 3.5–5.0)
Alkaline Phosphatase: 116 U/L (ref 38–126)
Anion gap: 5 (ref 5–15)
BUN: 16 mg/dL (ref 8–23)
CO2: 31 mmol/L (ref 22–32)
Calcium: 9.8 mg/dL (ref 8.9–10.3)
Chloride: 104 mmol/L (ref 98–111)
Creatinine: 0.73 mg/dL (ref 0.44–1.00)
GFR, Estimated: >60 mL/min (ref >60)
Glucose, Bld: 86 mg/dL (ref 70–99)
Potassium: 4.4 mmol/L (ref 3.5–5.1)
Sodium: 140 mmol/L (ref 135–145)
Total Bilirubin: 0.7 mg/dL (ref 0.3–1.2)
Total Protein: 6.7 g/dL (ref 6.5–8.1)

## 2022-08-10 LAB — LACTATE DEHYDROGENASE: LDH: 162 U/L (ref 98–192)

## 2022-08-10 NOTE — Progress Notes (Signed)
[name]     HEMATOLOGY/ONCOLOGY CLINIC NOTE  Date of Service: .08/10/2022    Patient Care Team: Bradd Canary, MD as PCP - General (Family Medicine) Mammography, [facility] (Diagnostic Radiology) Osborn Coho M.D. (ENT)  CHIEF COMPLAINTS/PURPOSE OF CONSULTATION:   F/u for follicular lymphoma/DLBCL  HISTORY OF PRESENTING ILLNESS:   Please see previous note for details of initial presentation.  INTERVAL HISTORY  [name] is a 77 y.o. 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  Today, she reports that she has been feeling well overall since our last visit. However, she does complain of her voice sounding "Gaetano Hawthorne" beginning a few months ago. She notes a lack of "smoothness" in her voice. She notes that her friends noticed about a year ago. She does note that her voice does improve after clearing her throat. She expressed some concern for any scar tissue.Patient is able to raise her voice if she chooses to. She is unsure if alcohol improves her symptoms. She does not believe that activities such as reading helps her symptoms. She denies any swallowing issues in general, but does note some mild difficulty when lying down. She denies any SOB, chest pain, new lumps/bumps in the neck, or abdominal pain. She denies any recent coughs or colds, but does experience seasonal allergies. She denies any soreness in her throat in the mornings.Patient continues to exercise and engages in yard work regularly.  MEDICAL HISTORY:  Past Medical History:  Diagnosis Date   Anemia    h/o iron deficiency secondary to heavy menses   Arthritis    left hand index finger   Diffuse large B cell lymphoma (HCC) dx'd 04/2016   Grief reaction 01/27/2016   H/O measles    as a child   History of chicken pox    as a child   History of PCOS    Hyperlipidemia, mixed 01/27/2016    Lymphadenopathy    Peripheral neuropathy 01/31/2017   Preventative health care 01/27/2016   Vitamin D deficiency    Wears glasses     SURGICAL HISTORY: Past Surgical History:  Procedure Laterality Date   COLONOSCOPY     IR GENERIC HISTORICAL  05/21/2016   IR FLUORO GUIDE PORT INSERTION RIGHT 05/21/2016 Oley Balm, MD [facility]   IR GENERIC HISTORICAL  05/21/2016   IR US GUIDE VASC ACCESS RIGHT 05/21/2016 Oley Balm, MD [facility]   IR REMOVAL TUN ACCESS W/ PORT W/O FL MOD SED  10/27/2016   SKIN BIOPSY Left    face   THYROGLOSSAL DUCT CYST N/A 04/16/2016   Procedure: THYROGLOSSAL DUCT CYST excision;  Surgeon: Osborn Coho, MD;  Location: [facility] OR;  Service: ENT;  Laterality: N/A;    SOCIAL HISTORY: Social History   Socioeconomic History   Marital status: Widowed    Spouse name: Not on file   Number of children: Not on file   Years of education: Not on file   Highest education level: Not on file  Occupational History   Occupation: Psychotherapist  Tobacco Use   Smoking status: Never   Smokeless tobacco: Never  Vaping Use   Vaping Use: Never used  Substance and Sexual Activity   Alcohol use: Yes    Alcohol/week: 3.0 - 6.0 standard drinks    Types: 3 - 6 Standard drinks or equivalent per week    Comment: 5 glasses of wine a  week.   Drug use: No   Sexual activity: Never    Partners: Male    Birth control/protection: None  Other Topics Concern   Not on file  Social History Narrative   Married. Education: [employer]. Exercise: walking, yoga, and bicycling daily for 1-2 hours. 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    Difficulty of Paying Living Expenses: Not hard at all  Food Insecurity: No Food Insecurity   Worried About Programme researcher, broadcasting/film/video in the Last Year: Never true   Barista in the Last Year: Never true  Transportation Needs: No Transportation Needs   Lack of Transportation  (Medical): No   Lack of Transportation (Non-Medical): No  Physical Activity: Sufficiently Active   Days of Exercise per Week: 7 days   Minutes of Exercise per Session: 120 min  Stress: No Stress Concern Present   Feeling of Stress : Not at all  Social Connections: Moderately Integrated   Frequency of Communication with Friends and Family: More than three times a week   Frequency of Social Gatherings with Friends and Family: More than three times a week   Attends Religious Services: More than 4 times per year   Active Member of Clubs or Organizations: Not on file   Attends Banker Meetings: More than 4 times per year   Marital Status: Widowed  Catering manager Violence: Not At Risk   Fear of Current or Ex-Partner: No   Emotionally Abused: No   Physically Abused: No   Sexually Abused: No    FAMILY HISTORY: Family History  Problem Relation Age of Onset   Arthritis Mother        rheumatoid   Heart disease Father        CHF   COPD Sister        Emphysema, h/o cigarettes   Other Sister        pituatary tumor   Arthritis Sister    Obesity Sister    Cancer Paternal Grandfather        cancer   Stroke Maternal Aunt     ALLERGIES:  is allergic to erythromycin and other.  MEDICATIONS:  Current Outpatient Medications  Medication Sig Dispense Refill   Calcium-Magnesium-Vitamin D (CALCIUM MAGNESIUM PO) Take by mouth.     Cholecalciferol (VITAMIN D) 2000 units CAPS Take 2,000 Units by mouth daily.     ELDERBERRY PO Take by mouth.     Multiple Vitamins-Minerals (ZINC PO) Take by mouth.     Omega-3 Fatty Acids (OMEGA-3 PO) Take 2 capsules by mouth daily. Omega 3 1280 mg     vitamin E 180 MG (400 UNITS) capsule Take 400 Units by mouth daily.     No current facility-administered medications for this visit.    REVIEW OF SYSTEMS:    10 Point review of Systems was done is negative except as noted above.   PHYSICAL EXAMINATION:  ECOG PERFORMANCE STATUS: 0 -  Asymptomatic  Vitals:   08/04/21 1440  BP: 122/70  Pulse: 70  Resp: 20  Temp: (!) 97.2 F (36.2 C)  SpO2: 99%   Filed Weights   08/04/21 1440  Weight: 169 lb 14.4 oz (77.1 kg)   .Body mass index is 25.09 kg/m.   GENERAL:alert, in no acute distress and comfortable SKIN: no acute rashes, no significant lesions EYES: conjunctiva are pink and non-injected, sclera anicteric OROPHARYNX: MMM, no exudates, no oropharyngeal erythema or  ulceration NECK: supple, no JVD LYMPH:  no palpable lymphadenopathy in the cervical, axillary or inguinal regions LUNGS: clear to auscultation b/l with normal respiratory effort HEART: regular rate & rhythm ABDOMEN:  normoactive bowel sounds , non tender, not distended. Extremity: no pedal edema PSYCH: alert & oriented x 3 with fluent speech NEURO: no focal motor/sensory deficits   LABORATORY DATA:  I have reviewed the data as listed  .    Latest Ref Rng & Units 08/10/2022    9:53 AM 04/08/2022   10:53 AM 08/04/2021    2:31 PM  CBC  WBC 4.0 - 10.5 K/uL 4.7  5.9  5.8   Hemoglobin 12.0 - 15.0 g/dL 16.114.1  09.614.3  04.513.9   Hematocrit 36.0 - 46.0 % 41.9  42.0  41.3   Platelets 150 - 400 K/uL 269  293.0  265    CBC    Component Value Date/Time   WBC 4.7 08/10/2022 0953   WBC 5.9 04/08/2022 1053   RBC 4.55 08/10/2022 0953   HGB 14.1 08/10/2022 0953   HGB 13.3 03/07/2017 0842   HCT 41.9 08/10/2022 0953   HCT 39.1 03/07/2017 0842   PLT 269 08/10/2022 0953   PLT 283 03/07/2017 0842   MCV 92.1 08/10/2022 0953   MCV 91.4 03/07/2017 0842   MCH 31.0 08/10/2022 0953   MCHC 33.7 08/10/2022 0953   RDW 12.8 08/10/2022 0953   RDW 13.3 03/07/2017 0842   LYMPHSABS 1.4 08/10/2022 0953   LYMPHSABS 0.4 (L) 03/07/2017 0842   MONOABS 0.4 08/10/2022 0953   MONOABS 0.7 03/07/2017 0842   EOSABS 0.1 08/10/2022 0953   EOSABS 0.0 03/07/2017 0842   BASOSABS 0.0 08/10/2022 0953   BASOSABS 0.0 03/07/2017 0842    .    Latest Ref Rng & Units 08/10/2022    9:53 AM  04/08/2022   10:53 AM 08/04/2021    2:31 PM  CMP  Glucose 70 - 99 mg/dL 86  91  [id number]   BUN 8 - 23 mg/dL 16  15  16    Creatinine 0.44 - 1.00 mg/dL 8.110.73  9.140.65  7.820.59   Sodium 135 - 145 mmol/L 140  138  139   Potassium 3.5 - 5.1 mmol/L 4.4  4.7  3.9   Chloride 98 - 111 mmol/L 104  102  104   CO2 22 - 32 mmol/L 31  31  29    Calcium 8.9 - 10.3 mg/dL 9.8  9.6  9.5   Total Protein 6.5 - 8.1 g/dL 6.7  6.6  6.6   Total Bilirubin 0.3 - 1.2 mg/dL 0.7  0.6  0.6   Alkaline Phos 38 - 126 U/L 116  102  89   AST 15 - 41 U/L 14  13  15    ALT 0 - 44 U/L 14  16  17     Component     Latest Ref Rng & Units 05/07/2016  Hepatitis B Surface Ag     Negative Negative  Hep B Core Ab, Tot     Negative Negative  Hep C Virus Ab     0.0 - 0.9 s/co ratio <0.1  .  Lab Results  Component Value Date   LDH 162 08/10/2022         RADIOGRAPHIC STUDIES:   ASSESSMENT & PLAN:   77 y.o. 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  Initial PET/CT showed Scattered  small mildly hypermetabolic lymph nodes within the neck, left hilum, left axilla and left groin, potentially related the patient's lymphoma. Focal hypermetabolic activity within the left sternal manubrium and left iliac bone, also potentially related to the patient's lymphoma. No evidence of solid visceral organ involvement.  CT guided bone marrow biopsy done -- no evidence of lymphoma involving Bone marrow. Normal LDH. No constitutional symptoms. Patient has no significant medical comorbidities at baseline. ECHO with nl EF as expected.  Patient has completed 6 cycles of R CHOP PET/CT s/p 6 cycles of R-CHOP shows complete metabolic response CT chest/abd/pelvis 07/07/2017 - showed no radipgraphic evidence of lymphoma progression at this time.  Lab Results  Component Value Date   LDH 166 02/11/2021   11/06/2018 CT C/A/P with results  revealing "1. No evidence of recurrent lymphoma. 2.  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 No issues with dexamethasone pretreatment -Patient will continue dexamethasone premedication to reduce risk of Rituxan hypersensitivity.  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 CBC showed WBC of 4.7K, hemoglobin of 14.1, and platelets of 269K. CMP WNL LDH WNL at 162 -discussed option of CT scan of chest to evaluate for any local inflammation -No lab or clinical evidence of FL recurrence at this time. Will continue watchful observation.  -will refer patient to ENT for further evaluation of change in vocalization. -answered all of patient's question in great detail -Will see back in 1 year with labs.   FOLLOW-UP: ENT referral to Dr Cameron AliJeffery Rosen to evaluate for voice changes RTC with Dr Candise CheKale with labs in 12 months  The total time spent in the appointment was 20 minutes* .  All of the patient's questions were answered with apparent satisfaction. The patient knows to call the clinic with any problems, questions or concerns.   Wyvonnia LoraGautam Nemiah Kissner MD MS AAHIVMS [facility] Hematology/Oncology Physician [facility]  .*Total Encounter Time as defined by the Centers for Medicare and Medicaid Services includes, in addition to the face-to-face time of a patient visit (documented in the note above) non-face-to-face time: obtaining and reviewing outside history, ordering and reviewing medications, tests or procedures, care coordination (communications with other health care professionals or caregivers) and documentation in the medical record.    I,Mitra Faeizi,acting as a Neurosurgeonscribe for Wyvonnia LoraGautam Keenen Roessner, MD.,have documented  all relevant documentation on the behalf of Wyvonnia LoraGautam Adhrit Krenz, MD,as directed by  Wyvonnia LoraGautam Zendayah Hardgrave, MD while in the presence of Wyvonnia LoraGautam Jovonda Selner, MD.   .I have reviewed the above documentation for accuracy and completeness, and I agree with the above. Johney Maine.Hunter Pinkard Kishore Jeydi Klingel MD

## 2022-08-21 ENCOUNTER — Other Ambulatory Visit (HOSPITAL_COMMUNITY): Payer: Self-pay

## 2022-09-21 DIAGNOSIS — J383 Other diseases of vocal cords: Secondary | ICD-10-CM | POA: Diagnosis not present

## 2022-09-23 ENCOUNTER — Encounter: Payer: Self-pay | Admitting: Family Medicine

## 2022-09-27 ENCOUNTER — Other Ambulatory Visit: Payer: Self-pay | Admitting: Family Medicine

## 2022-09-27 MED ORDER — CEFDINIR 300 MG PO CAPS
300.0000 mg | ORAL_CAPSULE | Freq: Two times a day (BID) | ORAL | 0 refills | Status: AC
  Filled 2022-09-27: qty 20, 10d supply, fill #0

## 2022-09-28 ENCOUNTER — Other Ambulatory Visit (HOSPITAL_COMMUNITY): Payer: Self-pay

## 2022-10-14 ENCOUNTER — Ambulatory Visit: Payer: Medicare Other | Admitting: Family Medicine

## 2022-10-21 ENCOUNTER — Ambulatory Visit: Payer: Medicare Other | Admitting: Family Medicine

## 2022-10-27 DIAGNOSIS — R498 Other voice and resonance disorders: Secondary | ICD-10-CM | POA: Diagnosis not present

## 2022-10-27 DIAGNOSIS — R490 Dysphonia: Secondary | ICD-10-CM | POA: Diagnosis not present

## 2022-10-27 DIAGNOSIS — J383 Other diseases of vocal cords: Secondary | ICD-10-CM | POA: Diagnosis not present

## 2022-10-27 DIAGNOSIS — J385 Laryngeal spasm: Secondary | ICD-10-CM | POA: Diagnosis not present

## 2022-10-27 NOTE — Progress Notes (Unsigned)
Subjective:    [name], female    DOB: July 21, 1945, 77 y.o.   MRN: [id number]  No chief complaint on file.   HPI Discussed the use of AI scribe software for clinical note transcription with the patient, who gave verbal consent to proceed.  History of Present Illness        Patient a 77 yo female in today for follow up on chronic medical concerns. No recent febrile illness or hospitalizations. Denies CP/palp/SOB/HA/congestion/fevers/GI or GU c/o. Taking meds as prescribed     Past Medical History:  Diagnosis Date   Anemia    h/o iron deficiency secondary to heavy menses   Arthritis    left hand index finger   Diffuse large B cell lymphoma (HCC) dx'd 04/2016   Grief reaction 01/27/2016   H/O measles    as a child   History of chicken pox    as a child   History of PCOS    Hyperlipidemia, mixed 01/27/2016   Lymphadenopathy    Peripheral neuropathy 01/31/2017   Preventative health care 01/27/2016   Vitamin D deficiency    Wears glasses     Past Surgical History:  Procedure Laterality Date   COLONOSCOPY     IR GENERIC HISTORICAL  05/21/2016   IR FLUORO GUIDE PORT INSERTION RIGHT 05/21/2016 Oley Balm, MD [facility]   IR GENERIC HISTORICAL  05/21/2016   IR US GUIDE VASC ACCESS RIGHT 05/21/2016 Oley Balm, MD [facility]   IR REMOVAL TUN ACCESS W/ PORT W/O FL MOD SED  10/27/2016   SKIN BIOPSY Left    face   THYROGLOSSAL DUCT CYST N/A 04/16/2016   Procedure: THYROGLOSSAL DUCT CYST excision;  Surgeon: Osborn Coho, MD;  Location: [facility] OR;  Service: ENT;  Laterality: N/A;    Family History  Problem Relation Age of Onset   Arthritis Mother        rheumatoid   Heart disease Father        CHF   COPD Sister        Emphysema, h/o cigarettes   Other Sister        pituatary tumor   Arthritis Sister    Obesity Sister    Cancer Paternal Grandfather        cancer   Stroke Maternal Aunt     Social History   Socioeconomic History   Marital  status: Widowed    Spouse name: Not on file   Number of children: Not on file   Years of education: Not on file   Highest education level: Doctorate  Occupational History   Occupation: Psychotherapist  Tobacco Use   Smoking status: Never   Smokeless tobacco: Never  Vaping Use   Vaping Use: Never used  Substance and Sexual Activity   Alcohol use: Yes    Alcohol/week: 3.0 - 6.0 standard drinks of alcohol    Types: 3 - 6 Standard drinks or equivalent per week    Comment: 5 glasses of wine a week.   Drug use: No   Sexual activity: Never    Partners: Male    Birth control/protection: None  Other Topics Concern   Not on file  Social History Narrative   Married. Education: [employer]. Exercise: walking, yoga, and bicycling daily for 1-2 hours. 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  (10/25/2022)   Overall Financial Resource Strain (CARDIA)    Difficulty of Paying Living Expenses:  Not hard at all  Food Insecurity: No Food Insecurity (10/25/2022)   Hunger Vital Sign    Worried About Running Out of Food in the Last Year: Never true    Ran Out of Food in the Last Year: Never true  Transportation Needs: No Transportation Needs (10/25/2022)   PRAPARE - Administrator, Civil Service (Medical): No    Lack of Transportation (Non-Medical): No  Physical Activity: Sufficiently Active (10/25/2022)   Exercise Vital Sign    Days of Exercise per Week: 7 days    Minutes of Exercise per Session: 120 min  Stress: No Stress Concern Present (10/25/2022)   [employer] of Occupational Health - Occupational Stress Questionnaire    Feeling of Stress : Not at all  Social Connections: Moderately Integrated (10/25/2022)   Social Connection and Isolation Panel [NHANES]    Frequency of Communication with Friends and Family: More than three times a week    Frequency of Social Gatherings with Friends and Family: More than three  times a week    Attends Religious Services: More than 4 times per year    Active Member of [employer] or Organizations: Yes    Attends Banker Meetings: More than 4 times per year    Marital Status: Widowed  Intimate Partner Violence: Not At Risk (04/22/2022)   Humiliation, Afraid, Rape, and Kick questionnaire    Fear of Current or Ex-Partner: No    Emotionally Abused: No    Physically Abused: No    Sexually Abused: No    Outpatient Medications Prior to Visit  Medication Sig Dispense Refill   Vitamin D, Ergocalciferol, (DRISDOL) 1.25 MG (50000 UNIT) CAPS capsule Take 1 capsule by mouth every 7 days. 5 capsule 5   Calcium-Magnesium-Vitamin D (CALCIUM MAGNESIUM PO) Take by mouth.     Cholecalciferol (VITAMIN D) 2000 units CAPS Take 2,000 Units by mouth daily.     ELDERBERRY PO Take by mouth.     Multiple Vitamins-Minerals (ZINC PO) Take by mouth.     Omega-3 Fatty Acids (OMEGA-3 PO) Take 2 capsules by mouth daily. Omega 3 1280 mg     polyethylene glycol-electrolytes (NULYTELY) 420 g solution Use as directed 4000 mL 0   vitamin E 180 MG (400 UNITS) capsule Take 400 Units by mouth daily.     No facility-administered medications prior to visit.    Allergies  Allergen Reactions   Erythromycin Other (See Comments)    Patient states that she passed out while using this medication ? SYNCOPE ?   Other Anaphylaxis    # # # CATS # # #    Review of Systems  Constitutional:  Negative for fever and malaise/fatigue.  HENT:  Negative for congestion.   Eyes:  Negative for blurred vision.  Respiratory:  Negative for shortness of breath.   Cardiovascular:  Negative for chest pain, palpitations and leg swelling.  Gastrointestinal:  Negative for abdominal pain, blood in stool and nausea.  Genitourinary:  Negative for dysuria and frequency.  Musculoskeletal:  Negative for falls.  Skin:  Negative for rash.  Neurological:  Negative for dizziness, loss of consciousness and headaches.   Endo/Heme/Allergies:  Negative for environmental allergies.  Psychiatric/Behavioral:  Negative for depression. The patient is not nervous/anxious.        Objective:    Physical Exam Constitutional:      General: She is not in acute distress.    Appearance: Normal appearance. She is well-developed. She is not toxic-appearing.  HENT:  Head: Normocephalic and atraumatic.     Right Ear: External ear normal.     Left Ear: External ear normal.     Nose: Nose normal.  Eyes:     General:        Right eye: No discharge.        Left eye: No discharge.     Conjunctiva/sclera: Conjunctivae normal.  Neck:     Thyroid: No thyromegaly.  Cardiovascular:     Rate and Rhythm: Normal rate and regular rhythm.     Heart sounds: Normal heart sounds. No murmur heard. Pulmonary:     Effort: Pulmonary effort is normal. No respiratory distress.     Breath sounds: Normal breath sounds.  Abdominal:     General: Bowel sounds are normal.     Palpations: Abdomen is soft.     Tenderness: There is no abdominal tenderness. There is no guarding.  Musculoskeletal:        General: Normal range of motion.     Cervical back: Neck supple.  Lymphadenopathy:     Cervical: No cervical adenopathy.  Skin:    General: Skin is warm and dry.  Neurological:     Mental Status: She is alert and oriented to person, place, and time.  Psychiatric:        Mood and Affect: Mood normal.        Behavior: Behavior normal.        Thought Content: Thought content normal.        Judgment: Judgment normal.     There were no vitals taken for this visit. Wt Readings from Last 3 Encounters:  08/10/22 168 lb 6.4 oz (76.4 kg)  04/08/22 172 lb (78 kg)  08/04/21 169 lb 14.4 oz (77.1 kg)    Diabetic Foot Exam - Simple   No data filed    Lab Results  Component Value Date   WBC 4.7 08/10/2022   HGB 14.1 08/10/2022   HCT 41.9 08/10/2022   PLT 269 08/10/2022   GLUCOSE 86 08/10/2022   CHOL 237 (H) 04/08/2022   TRIG  81.0 04/08/2022   HDL 88.40 04/08/2022   LDLCALC 132 (H) 04/08/2022   ALT 14 08/10/2022   AST 14 (L) 08/10/2022   NA 140 08/10/2022   K 4.4 08/10/2022   CL 104 08/10/2022   CREATININE 0.73 08/10/2022   BUN 16 08/10/2022   CO2 31 08/10/2022   TSH 1.55 05/29/2020   INR 1.00 10/27/2016   HGBA1C 5.7 04/08/2022    Lab Results  Component Value Date   TSH 1.55 05/29/2020   Lab Results  Component Value Date   WBC 4.7 08/10/2022   HGB 14.1 08/10/2022   HCT 41.9 08/10/2022   MCV 92.1 08/10/2022   PLT 269 08/10/2022   Lab Results  Component Value Date   NA 140 08/10/2022   K 4.4 08/10/2022   CHLORIDE 106 03/07/2017   CO2 31 08/10/2022   GLUCOSE 86 08/10/2022   BUN 16 08/10/2022   CREATININE 0.73 08/10/2022   BILITOT 0.7 08/10/2022   ALKPHOS 116 08/10/2022   AST 14 (L) 08/10/2022   ALT 14 08/10/2022   PROT 6.7 08/10/2022   ALBUMIN 4.2 08/10/2022   CALCIUM 9.8 08/10/2022   ANIONGAP 5 08/10/2022   EGFR >60 03/07/2017   GFR 85.75 04/08/2022   Lab Results  Component Value Date   CHOL 237 (H) 04/08/2022   Lab Results  Component Value Date   HDL 88.40 04/08/2022   Lab Results  Component Value Date   LDLCALC 132 (H) 04/08/2022   Lab Results  Component Value Date   TRIG 81.0 04/08/2022   Lab Results  Component Value Date   CHOLHDL 3 04/08/2022   Lab Results  Component Value Date   HGBA1C 5.7 04/08/2022       Assessment & Plan:  Hyperlipidemia, mixed Assessment & Plan: Encourage heart healthy diet such as MIND or DASH diet, increase exercise, avoid trans fats, simple carbohydrates and processed foods, consider a krill or fish or flaxseed oil cap daily.    Vitamin D deficiency Assessment & Plan: Supplement and monitor    Hyperglycemia Assessment & Plan: hgba1c acceptable, minimize simple carbs. Increase exercise as tolerated.    Muscle cramp Assessment & Plan: Hydrate and monitor    Osteopenia, unspecified location Assessment &  Plan: Encouraged to get adequate exercise, calcium and vitamin d intake    Diffuse large B-cell lymphoma, unspecified body region [facility]) Assessment & Plan: Follows with oncology     Assessment and Plan              Danise Edge, MD

## 2022-10-27 NOTE — Assessment & Plan Note (Signed)
Hydrate and monitor 

## 2022-10-27 NOTE — Assessment & Plan Note (Signed)
Encouraged to get adequate exercise, calcium and vitamin d intake 

## 2022-10-27 NOTE — Assessment & Plan Note (Signed)
Supplement and monitor 

## 2022-10-27 NOTE — Assessment & Plan Note (Signed)
Follows with oncology

## 2022-10-27 NOTE — Assessment & Plan Note (Signed)
Encourage heart healthy diet such as MIND or DASH diet, increase exercise, avoid trans fats, simple carbohydrates and processed foods, consider a krill or fish or flaxseed oil cap daily.  °

## 2022-10-27 NOTE — Assessment & Plan Note (Signed)
hgba1c acceptable, minimize simple carbs. Increase exercise as tolerated.  

## 2022-10-28 ENCOUNTER — Ambulatory Visit (INDEPENDENT_AMBULATORY_CARE_PROVIDER_SITE_OTHER): Payer: Medicare Other | Admitting: Family Medicine

## 2022-10-28 VITALS — BP 124/76 | HR 66 | Temp 98.0°F | Resp 16 | Ht 69.0 in | Wt 164.4 lb

## 2022-10-28 DIAGNOSIS — C8330 Diffuse large B-cell lymphoma, unspecified site: Secondary | ICD-10-CM | POA: Diagnosis not present

## 2022-10-28 DIAGNOSIS — E782 Mixed hyperlipidemia: Secondary | ICD-10-CM

## 2022-10-28 DIAGNOSIS — E559 Vitamin D deficiency, unspecified: Secondary | ICD-10-CM

## 2022-10-28 DIAGNOSIS — R739 Hyperglycemia, unspecified: Secondary | ICD-10-CM

## 2022-10-28 DIAGNOSIS — R252 Cramp and spasm: Secondary | ICD-10-CM

## 2022-10-28 DIAGNOSIS — R251 Tremor, unspecified: Secondary | ICD-10-CM | POA: Diagnosis not present

## 2022-10-28 DIAGNOSIS — M8580 Other specified disorders of bone density and structure, unspecified site: Secondary | ICD-10-CM | POA: Diagnosis not present

## 2022-10-28 DIAGNOSIS — M79642 Pain in left hand: Secondary | ICD-10-CM

## 2022-10-28 DIAGNOSIS — R490 Dysphonia: Secondary | ICD-10-CM

## 2022-10-28 DIAGNOSIS — M79641 Pain in right hand: Secondary | ICD-10-CM

## 2022-10-28 LAB — COMPREHENSIVE METABOLIC PANEL
ALT: 17 U/L (ref 0–35)
AST: 16 U/L (ref 0–37)
Albumin: 4.4 g/dL (ref 3.5–5.2)
Alkaline Phosphatase: 92 U/L (ref 39–117)
BUN: 12 mg/dL (ref 6–23)
CO2: 32 mEq/L (ref 19–32)
Calcium: 9.9 mg/dL (ref 8.4–10.5)
Chloride: 104 mEq/L (ref 96–112)
Creatinine, Ser: 0.65 mg/dL (ref 0.40–1.20)
GFR: 85.42 mL/min (ref >60.00)
Glucose, Bld: 89 mg/dL (ref 70–99)
Potassium: 4.9 mEq/L (ref 3.5–5.1)
Sodium: 142 mEq/L (ref 135–145)
Total Bilirubin: 0.8 mg/dL (ref 0.2–1.2)
Total Protein: 6.7 g/dL (ref 6.0–8.3)

## 2022-10-28 LAB — MAGNESIUM: Magnesium: 2.1 mg/dL (ref 1.5–2.5)

## 2022-10-28 LAB — CBC WITH DIFFERENTIAL/PLATELET
Basophils Absolute: 0 10*3/uL (ref 0.0–0.1)
Basophils Relative: 0.6 % (ref 0.0–3.0)
Eosinophils Absolute: 0.1 10*3/uL (ref 0.0–0.7)
Eosinophils Relative: 2.6 % (ref 0.0–5.0)
HCT: 43.2 % (ref 36.0–46.0)
Hemoglobin: 14.2 g/dL (ref 12.0–15.0)
Lymphocytes Relative: 35.3 % (ref 12.0–46.0)
Lymphs Abs: 1.5 10*3/uL (ref 0.7–4.0)
MCHC: 32.9 g/dL (ref 30.0–36.0)
MCV: 92.4 fl (ref 78.0–100.0)
Monocytes Absolute: 0.3 10*3/uL (ref 0.1–1.0)
Monocytes Relative: 6.9 % (ref 3.0–12.0)
Neutro Abs: 2.4 10*3/uL (ref 1.4–7.7)
Neutrophils Relative %: 54.6 % (ref 43.0–77.0)
Platelets: 282 10*3/uL (ref 150.0–400.0)
RBC: 4.68 Mil/uL (ref 3.87–5.11)
RDW: 13.5 % (ref 11.5–15.5)
WBC: 4.3 10*3/uL (ref 4.0–10.5)

## 2022-10-28 LAB — LIPID PANEL
Cholesterol: 191 mg/dL (ref 0–200)
HDL: 67.2 mg/dL (ref >39.00)
LDL Cholesterol: 109 mg/dL — ABNORMAL HIGH (ref 0–99)
NonHDL: 123.84
Total CHOL/HDL Ratio: 3
Triglycerides: 74 mg/dL (ref 0.0–149.0)
VLDL: 14.8 mg/dL (ref 0.0–40.0)

## 2022-10-28 LAB — HEMOGLOBIN A1C: Hgb A1c MFr Bld: 5.7 % (ref 4.6–6.5)

## 2022-10-28 LAB — TSH: TSH: 1.27 u[IU]/mL (ref 0.35–5.50)

## 2022-10-28 NOTE — Patient Instructions (Signed)
Hoarseness  Hoarseness, also called dysphonia, is any abnormal change in your voice that can make it difficult to speak. Your voice may sound raspy, breathy, or strained. Hoarseness is caused by a problem with your vocal cords (vocal folds). These are two bands of tissue inside your voice box (larynx). When you speak, your vocal cords move back and forth to create sound. The surfaces of your vocal cords need to be smooth for your voice to sound clear. Swelling or lumps on your vocal cords can cause hoarseness. Vocal cord problems may be the result of injuries or abnormal growths, certain diseases, upper respiratory infection, or allergies. Other causes may include medicine side effects and exposure to irritants. Follow these instructions at home:  Pay attention to any changes in your symptoms. Take these actions to stay safe and to help relieve your symptoms: Lifestyle Do not eat foods that give you heartburn, such as spicy or acidic foods like hot peppers and orange juice. These foods can cause a gastroesophageal reflux that may worsen your vocal cord problems. Limit how much alcohol and caffeine you drink as told by your health care provider. Drink enough fluid to keep your urine pale yellow. Do not use any products that contain nicotine or tobacco. These products include cigarettes, chewing tobacco, and vaping devices, such as e-cigarettes. If you need help quitting, ask your health care provider. Avoid secondhand smoke. General instructions Use a humidifier if the air in your home is dry. Avoid coughing or clearing your throat. Do not whisper. Whispering can cause muscle strain. Do not speak in a loud or harsh voice. Rest your voice. If recommended by your health care provider, schedule an appointment with a speech-language specialist. This specialist may give you methods to try that can help you avoid misusing your voice. Contact a health care provider if: Your voice is hoarse longer than  2 weeks. You almost lose or completely lose your voice for more than 3 days. You have pain when you swallow or try to talk. You feel a lump in your neck. Get help right away if: You have trouble swallowing. You feel like you are choking when you swallow. You cough up blood or vomit blood. You have trouble breathing. You choke, cannot swallow, or cannot breathe if you lie flat. You notice swelling or a rash on your body, face, or tongue. These symptoms may represent a serious problem that is an emergency. Do not wait to see if the symptoms will go away. Get medical help right away. Call your local emergency services (911 in the [place] Do not drive yourself to the hospital. Summary Hoarseness, also called dysphonia, is any abnormal change in your voice that can make it difficult to speak. Your voice may sound raspy, breathy, or strained. Hoarseness is caused by a problem with your vocal cords (vocal folds). Do not speak in a loud or harsh voice, whisper, use nicotine or tobacco products, or eat foods that give you heartburn. See your health care provider if your hoarseness does not improve after 2 weeks. This information is not intended to replace advice given to you by your health care provider. Make sure you discuss any questions you have with your health care provider. Document Revised: 10/07/2020 Document Reviewed: 10/07/2020 Elsevier Patient Education  2024 [employer]  

## 2022-10-28 NOTE — Assessment & Plan Note (Signed)
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 They will do bilateral injections starting in September and then every 3 months or so. She is apprehensive but she is going to proceed.

## 2022-10-29 LAB — RHEUMATOID FACTOR: Rheumatoid fact SerPl-aCnc: 10 IU/mL (ref <14)

## 2022-10-29 NOTE — Progress Notes (Signed)
Seen by patient Lindsay Harrison on 10/29/2022 11:28 AM  Called patient aware she has seen labs via mychart and to see if patient had any questions. Patient verbalized understanding and stated no questions and happy about results with no further questions.

## 2022-10-31 LAB — VITAMIN D 1,25 DIHYDROXY
Vitamin D 1, 25 (OH)2 Total: 49 pg/mL (ref 18–72)
Vitamin D2 1, 25 (OH)2: 33 pg/mL
Vitamin D3 1, 25 (OH)2: 16 pg/mL

## 2022-10-31 LAB — ANA: Anti Nuclear Antibody (ANA): NEGATIVE

## 2023-01-07 DIAGNOSIS — J383 Other diseases of vocal cords: Secondary | ICD-10-CM | POA: Diagnosis not present

## 2023-01-07 DIAGNOSIS — J385 Laryngeal spasm: Secondary | ICD-10-CM | POA: Diagnosis not present

## 2023-01-24 DIAGNOSIS — D485 Neoplasm of uncertain behavior of skin: Secondary | ICD-10-CM | POA: Diagnosis not present

## 2023-01-24 DIAGNOSIS — L570 Actinic keratosis: Secondary | ICD-10-CM | POA: Diagnosis not present

## 2023-01-24 DIAGNOSIS — D0462 Carcinoma in situ of skin of left upper limb, including shoulder: Secondary | ICD-10-CM | POA: Diagnosis not present

## 2023-02-16 DIAGNOSIS — D0462 Carcinoma in situ of skin of left upper limb, including shoulder: Secondary | ICD-10-CM | POA: Diagnosis not present

## 2023-02-17 DIAGNOSIS — J385 Laryngeal spasm: Secondary | ICD-10-CM | POA: Diagnosis not present

## 2023-02-17 DIAGNOSIS — R490 Dysphonia: Secondary | ICD-10-CM | POA: Diagnosis not present

## 2023-02-17 DIAGNOSIS — J383 Other diseases of vocal cords: Secondary | ICD-10-CM | POA: Diagnosis not present

## 2023-02-17 DIAGNOSIS — R498 Other voice and resonance disorders: Secondary | ICD-10-CM | POA: Diagnosis not present

## 2023-03-09 DIAGNOSIS — L570 Actinic keratosis: Secondary | ICD-10-CM | POA: Diagnosis not present

## 2023-03-09 DIAGNOSIS — L814 Other melanin hyperpigmentation: Secondary | ICD-10-CM | POA: Diagnosis not present

## 2023-03-09 DIAGNOSIS — Z85828 Personal history of other malignant neoplasm of skin: Secondary | ICD-10-CM | POA: Diagnosis not present

## 2023-03-09 DIAGNOSIS — Z808 Family history of malignant neoplasm of other organs or systems: Secondary | ICD-10-CM | POA: Diagnosis not present

## 2023-03-09 DIAGNOSIS — Z86018 Personal history of other benign neoplasm: Secondary | ICD-10-CM | POA: Diagnosis not present

## 2023-03-09 DIAGNOSIS — L821 Other seborrheic keratosis: Secondary | ICD-10-CM | POA: Diagnosis not present

## 2023-03-09 DIAGNOSIS — D225 Melanocytic nevi of trunk: Secondary | ICD-10-CM | POA: Diagnosis not present

## 2023-03-17 DIAGNOSIS — Z1231 Encounter for screening mammogram for malignant neoplasm of breast: Secondary | ICD-10-CM | POA: Diagnosis not present

## 2023-03-17 LAB — HM MAMMOGRAPHY

## 2023-03-18 ENCOUNTER — Encounter: Payer: Self-pay | Admitting: Family Medicine

## 2023-04-14 ENCOUNTER — Encounter: Payer: Medicare Other | Admitting: Family Medicine

## 2023-04-22 DIAGNOSIS — J383 Other diseases of vocal cords: Secondary | ICD-10-CM | POA: Diagnosis not present

## 2023-04-22 DIAGNOSIS — J385 Laryngeal spasm: Secondary | ICD-10-CM | POA: Diagnosis not present

## 2023-05-02 ENCOUNTER — Ambulatory Visit (INDEPENDENT_AMBULATORY_CARE_PROVIDER_SITE_OTHER): Payer: Medicare Other | Admitting: *Deleted

## 2023-05-02 DIAGNOSIS — Z0000 Encounter for general adult medical examination without abnormal findings: Secondary | ICD-10-CM

## 2023-05-02 NOTE — Patient Instructions (Signed)
Lindsay Harrison , Thank you for taking time to come for your Medicare Wellness Visit. I appreciate your ongoing commitment to your health goals. Please review the following plan we discussed and let me know if I can assist you in the future.   This is a list of the screening recommended for you and due dates:  Health Maintenance  Topic Date Due   COVID-19 Vaccine (10 - 2024-25 season) 03/17/2023   DEXA scan (bone density measurement)  04/08/2023   Mammogram  03/16/2024   Medicare Annual Wellness Visit  05/01/2024   DTaP/Tdap/Td vaccine (3 - Td or Tdap) 02/04/2025   Colon Cancer Screening  11/10/2026   Pneumonia Vaccine  Completed   Flu Shot  Completed   Hepatitis C Screening  Completed   Zoster (Shingles) Vaccine  Completed   HPV Vaccine  Aged Out    Next appointment: Follow up in one year for your annual wellness visit.   Preventive Care 85 Years and Older, Female Preventive care refers to lifestyle choices and visits with your health care provider that can promote health and wellness. What does preventive care include? A yearly physical exam. This is also called an annual well check. Dental exams once or twice a year. Routine eye exams. Ask your health care provider how often you should have your eyes checked. Personal lifestyle choices, including: Daily care of your teeth and gums. Regular physical activity. Eating a healthy diet. Avoiding tobacco and drug use. Limiting alcohol use. Practicing safe sex. Taking low-dose aspirin every day. Taking vitamin and mineral supplements as recommended by your health care provider. What happens during an annual well check? The services and screenings done by your health care provider during your annual well check will depend on your age, overall health, lifestyle risk factors, and family history of disease. Counseling  Your health care provider may ask you questions about your: Alcohol use. Tobacco use. Drug use. Emotional  well-being. Home and relationship well-being. Sexual activity. Eating habits. History of falls. Memory and ability to understand (cognition). Work and work Astronomer. Reproductive health. Screening  You may have the following tests or measurements: Height, weight, and BMI. Blood pressure. Lipid and cholesterol levels. These may be checked every 5 years, or more frequently if you are over 110 years old. Skin check. Lung cancer screening. You may have this screening every year starting at age 63 if you have a 30-pack-year history of smoking and currently smoke or have quit within the past 15 years. Fecal occult blood test (FOBT) of the stool. You may have this test every year starting at age 74. Flexible sigmoidoscopy or colonoscopy. You may have a sigmoidoscopy every 5 years or a colonoscopy every 10 years starting at age 54. Hepatitis C blood test. Hepatitis B blood test. Sexually transmitted disease (STD) testing. Diabetes screening. This is done by checking your blood sugar (glucose) after you have not eaten for a while (fasting). You may have this done every 1-3 years. Bone density scan. This is done to screen for osteoporosis. You may have this done starting at age 54. Mammogram. This may be done every 1-2 years. Talk to your health care provider about how often you should have regular mammograms. Talk with your health care provider about your test results, treatment options, and if necessary, the need for more tests. Vaccines  Your health care provider may recommend certain vaccines, such as: Influenza vaccine. This is recommended every year. Tetanus, diphtheria, and acellular pertussis (Tdap, Td) vaccine. You may  need a Td booster every 10 years. Zoster vaccine. You may need this after age 32. Pneumococcal 13-valent conjugate (PCV13) vaccine. One dose is recommended after age 70. Pneumococcal polysaccharide (PPSV23) vaccine. One dose is recommended after age 68. Talk to your  health care provider about which screenings and vaccines you need and how often you need them. This information is not intended to replace advice given to you by your health care provider. Make sure you discuss any questions you have with your health care provider. Document Released: 05/16/2015 Document Revised: 01/07/2016 Document Reviewed: 02/18/2015 Elsevier Interactive Patient Education  2017 [employer].  Fall Prevention in the Home Falls can cause injuries. They can happen to people of all ages. There are many things you can do to make your home safe and to help prevent falls. What can I do on the outside of my home? Regularly fix the edges of walkways and driveways and fix any cracks. Remove anything that might make you trip as you walk through a door, such as a raised step or threshold. Trim any bushes or trees on the path to your home. Use bright outdoor lighting. Clear any walking paths of anything that might make someone trip, such as rocks or tools. Regularly check to see if handrails are loose or broken. Make sure that both sides of any steps have handrails. Any raised decks and porches should have guardrails on the edges. Have any leaves, snow, or ice cleared regularly. Use sand or salt on walking paths during winter. Clean up any spills in your garage right away. This includes oil or grease spills. What can I do in the bathroom? Use night lights. Install grab bars by the toilet and in the tub and shower. Do not use towel bars as grab bars. Use non-skid mats or decals in the tub or shower. If you need to sit down in the shower, use a plastic, non-slip stool. Keep the floor dry. Clean up any water that spills on the floor as soon as it happens. Remove soap buildup in the tub or shower regularly. Attach bath mats securely with double-sided non-slip rug tape. Do not have throw rugs and other things on the floor that can make you trip. What can I do in the bedroom? Use night  lights. Make sure that you have a light by your bed that is easy to reach. Do not use any sheets or blankets that are too big for your bed. They should not hang down onto the floor. Have a firm chair that has side arms. You can use this for support while you get dressed. Do not have throw rugs and other things on the floor that can make you trip. What can I do in the kitchen? Clean up any spills right away. Avoid walking on wet floors. Keep items that you use a lot in easy-to-reach places. If you need to reach something above you, use a strong step stool that has a grab bar. Keep electrical cords out of the way. Do not use floor polish or wax that makes floors slippery. If you must use wax, use non-skid floor wax. Do not have throw rugs and other things on the floor that can make you trip. What can I do with my stairs? Do not leave any items on the stairs. Make sure that there are handrails on both sides of the stairs and use them. Fix handrails that are broken or loose. Make sure that handrails are as long as the stairways.  Check any carpeting to make sure that it is firmly attached to the stairs. Fix any carpet that is loose or worn. Avoid having throw rugs at the top or bottom of the stairs. If you do have throw rugs, attach them to the floor with carpet tape. Make sure that you have a light switch at the top of the stairs and the bottom of the stairs. If you do not have them, ask someone to add them for you. What else can I do to help prevent falls? Wear shoes that: Do not have high heels. Have rubber bottoms. Are comfortable and fit you well. Are closed at the toe. Do not wear sandals. If you use a stepladder: Make sure that it is fully opened. Do not climb a closed stepladder. Make sure that both sides of the stepladder are locked into place. Ask someone to hold it for you, if possible. Clearly mark and make sure that you can see: Any grab bars or handrails. First and last  steps. Where the edge of each step is. Use tools that help you move around (mobility aids) if they are needed. These include: Canes. Walkers. Scooters. Crutches. Turn on the lights when you go into a dark area. Replace any light bulbs as soon as they burn out. Set up your furniture so you have a clear path. Avoid moving your furniture around. If any of your floors are uneven, fix them. If there are any pets around you, be aware of where they are. Review your medicines with your doctor. Some medicines can make you feel dizzy. This can increase your chance of falling. Ask your doctor what other things that you can do to help prevent falls. This information is not intended to replace advice given to you by your health care provider. Make sure you discuss any questions you have with your health care provider. Document Released: 02/13/2009 Document Revised: 09/25/2015 Document Reviewed: 05/24/2014 Elsevier Interactive Patient Education  2017 [employer].

## 2023-05-02 NOTE — Progress Notes (Signed)
Subjective:   Lindsay Harrison is a 77 y.o. female who presents for Medicare Annual (Subsequent) preventive examination.  Visit Complete: Virtual I connected with  Lindsay Harrison on 05/02/23 by a audio enabled telemedicine application and verified that I am speaking with the correct person using two identifiers.  Patient Location: Home  Provider Location: Office/Clinic  I discussed the limitations of evaluation and management by telemedicine. The patient expressed understanding and agreed to proceed.  Vital Signs: Because this visit was a virtual/telehealth visit, some criteria may be missing or patient reported. Any vitals not documented were not able to be obtained and vitals that have been documented are patient reported.  Patient Medicare AWV questionnaire was completed by the patient on 05/01/23; I have confirmed that all information answered by patient is correct and no changes since this date.  Cardiac Risk Factors include: advanced age (>2men, >77 women);dyslipidemia     Objective:    There were no vitals filed for this visit. There is no height or weight on file to calculate BMI.     05/02/2023    9:28 AM 04/22/2022    8:26 AM 04/20/2021    8:25 AM 04/10/2020   12:44 PM 11/26/2019    3:18 PM 09/24/2019    9:51 AM 07/23/2019    8:37 AM  Advanced Directives  Does Patient Have a Medical Advance Directive? Yes Yes Yes Yes Yes Yes Yes  Type of Estate agent of [place];Living will Healthcare Power of [place];Living will Healthcare Power of [place];Living will Healthcare Power of [place];Living will Healthcare Power of [place];Living will Healthcare Power of [place];Living will Healthcare Power of [place];Living will  Does patient want to make changes to medical advance directive? No - Patient declined    No - Patient declined No - Patient declined No - Patient declined  Copy of Healthcare Power of Attorney in Chart? No - copy requested No - copy  requested Yes - validated most recent copy scanned in chart (See row information) Yes - validated most recent copy scanned in chart (See row information) No - copy requested Yes - validated most recent copy scanned in chart (See row information)     Current Medications (verified) Outpatient Encounter Medications as of 05/02/2023  Medication Sig   Calcium-Magnesium-Vitamin D (CALCIUM MAGNESIUM PO) Take by mouth.   Cholecalciferol (VITAMIN D) 2000 units CAPS Take 2,000 Units by mouth daily.   ELDERBERRY PO Take by mouth.   Multiple Vitamins-Minerals (ZINC PO) Take by mouth.   Omega-3 Fatty Acids (OMEGA-3 PO) Take 2 capsules by mouth daily. Omega 3 1280 mg   polyethylene glycol-electrolytes (NULYTELY) 420 g solution Use as directed   Vitamin D, Ergocalciferol, (DRISDOL) 1.25 MG (50000 UNIT) CAPS capsule Take 1 capsule by mouth every 7 days.   vitamin E 180 MG (400 UNITS) capsule Take 400 Units by mouth daily.   No facility-administered encounter medications on file as of 05/02/2023.    Allergies (verified) Erythromycin and Other   History: Past Medical History:  Diagnosis Date   Anemia    h/o iron deficiency secondary to heavy menses   Arthritis    left hand index finger   Diffuse large B cell lymphoma (HCC) dx'd 04/2016   Grief reaction 01/27/2016   H/O measles    as a child   History of chicken pox    as a child   History of PCOS    Hyperlipidemia, mixed 01/27/2016   Lymphadenopathy    Peripheral neuropathy 01/31/2017  Preventative health care 01/27/2016   Vitamin D deficiency    Wears glasses    Past Surgical History:  Procedure Laterality Date   COLONOSCOPY     IR GENERIC HISTORICAL  05/21/2016   IR FLUORO GUIDE PORT INSERTION RIGHT 05/21/2016 Lindsay Balm, MD [facility]   IR GENERIC HISTORICAL  05/21/2016   IR US GUIDE VASC ACCESS RIGHT 05/21/2016 Lindsay Balm, MD [facility]   IR REMOVAL TUN ACCESS W/ PORT W/O FL MOD SED  10/27/2016   SKIN BIOPSY Left    face    THYROGLOSSAL DUCT CYST N/A 04/16/2016   Procedure: THYROGLOSSAL DUCT CYST excision;  Surgeon: Osborn Coho, MD;  Location: [facility] OR;  Service: ENT;  Laterality: N/A;   Family History  Problem Relation Age of Onset   Arthritis Mother        rheumatoid   Heart disease Father        CHF   COPD Sister        Emphysema, h/o cigarettes   Other Sister        pituatary tumor   Arthritis Sister    Obesity Sister    Cancer Paternal Grandfather        cancer   Stroke Maternal Aunt    Social History   Socioeconomic History   Marital status: Widowed    Spouse name: Not on file   Number of children: Not on file   Years of education: Not on file   Highest education level: Doctorate  Occupational History   Occupation: Psychotherapist  Tobacco Use   Smoking status: Never   Smokeless tobacco: Never  Vaping Use   Vaping status: Never Used  Substance and Sexual Activity   Alcohol use: Yes    Alcohol/week: 3.0 standard drinks of alcohol    Types: 3 Glasses of wine per week    Comment: Minimal   Drug use: No   Sexual activity: Never    Partners: Male    Birth control/protection: None  Other Topics Concern   Not on file  Social History Narrative   Married. Education: [employer]. Exercise: walking, yoga, and bicycling daily for 1-2 hours. Lives alone, works as Airline pilot   Social Drivers of Corporate investment banker Strain: Low Risk  (05/01/2023)   Overall Financial Resource Strain (CARDIA)    Difficulty of Paying Living Expenses: Not hard at all  Food Insecurity: No Food Insecurity (05/01/2023)   Hunger Vital Sign    Worried About Running Out of Food in the Last Year: Never true    Ran Out of Food in the Last Year: Never true  Transportation Needs: No Transportation Needs (05/01/2023)   PRAPARE - Administrator, Civil Service (Medical): No    Lack of Transportation (Non-Medical): No  Physical Activity: Sufficiently Active (05/01/2023)   Exercise Vital Sign     Days of Exercise per Week: 7 days    Minutes of Exercise per Session: 150+ min  Stress: No Stress Concern Present (10/25/2022)   [employer] of Occupational Health - Occupational Stress Questionnaire    Feeling of Stress : Not at all  Social Connections: Moderately Integrated (05/01/2023)   Social Connection and Isolation Panel [NHANES]    Frequency of Communication with Friends and Family: More than three times a week    Frequency of Social Gatherings with Friends and Family: More than three times a week    Attends Religious Services: More than 4 times per year    Active Member  of Clubs or Organizations: Yes    Attends Banker Meetings: More than 4 times per year    Marital Status: Widowed    Tobacco Counseling Counseling given: Not Answered   Clinical Intake:  Pre-visit preparation completed: Yes  Pain : No/denies pain  Nutritional Risks: None Diabetes: No  How often do you need to have someone help you when you read instructions, pamphlets, or other written materials from your doctor or pharmacy?: 1 - Never  Interpreter Needed?: No  Information entered by :: Donne Anon, CMA   Activities of Daily Living    05/01/2023    1:39 PM 04/23/2023    7:06 PM  In your present state of health, do you have any difficulty performing the following activities:  Hearing? 0 0  Vision? 0 0  Difficulty concentrating or making decisions? 0 0  Walking or climbing stairs? 0 0  Dressing or bathing? 0 0  Doing errands, shopping? 0 0  Preparing Food and eating ? N N  Using the Toilet? N N  In the past six months, have you accidently leaked urine? Y N  Do you have problems with loss of bowel control? N N  Managing your Medications? N N  Managing your Finances? N N  Housekeeping or managing your Housekeeping? N N    Patient Care Team: Bradd Canary, MD as PCP - General (Family Medicine) Mammography, [facility] (Diagnostic Radiology)  Indicate any recent Medical  Services you may have received from other than [facility] providers in the past year (date may be approximate).     Assessment:   This is a routine wellness examination for Lindsay Harrison.  Hearing/Vision screen No results found.   Goals Addressed   None    Depression Screen    05/02/2023    9:48 AM 10/28/2022    8:49 AM 04/22/2022    8:29 AM 04/08/2022   10:08 AM 04/20/2021    8:30 AM 04/10/2020   12:47 PM 02/15/2019    1:20 PM  PHQ 2/9 Scores  PHQ - 2 Score 0 0 0 0 0 0 0  PHQ- 9 Score  0  0       Fall Risk    05/01/2023    1:39 PM 04/23/2023    7:06 PM 10/28/2022    8:48 AM 04/22/2022    8:26 AM 04/08/2022   10:07 AM  Fall Risk   Falls in the past year? 0 0 0 0 0  Number falls in past yr: 0  0 0 0  Injury with Fall? 0  0 0 0  Risk for fall due to : No Fall Risks   No Fall Risks   Follow up Falls evaluation completed  Falls evaluation completed Falls evaluation completed Falls evaluation completed    MEDICARE RISK AT HOME: Medicare Risk at Home Any stairs in or around the home?: Yes If so, are there any without handrails?: No Home free of loose throw rugs in walkways, pet beds, electrical cords, etc?: Yes Adequate lighting in your home to reduce risk of falls?: Yes Life alert?: No Use of a cane, walker or w/c?: No Grab bars in the bathroom?: Yes Shower chair or bench in shower?: Yes Elevated toilet seat or a handicapped toilet?: No  TIMED UP AND GO:  Was the test performed?  No    Cognitive Function:        05/02/2023    9:50 AM 04/22/2022    8:32 AM  6CIT Screen  What Year? 0 points 0 points  What month? 0 points 0 points  What time? 0 points 0 points  Count back from 20 0 points 0 points  Months in reverse 0 points 0 points  Repeat phrase 0 points 0 points  Total Score 0 points 0 points    Immunizations Immunization History  Administered Date(s) Administered   Fluad Quad(high Dose 65+) 01/15/2019, 02/22/2022   Hepatitis A 07/17/2007, 04/04/2008    Hepatitis B 07/17/2007, 09/11/2007, 02/15/2008   Influenza Split 02/24/2013, 02/03/2021   Influenza, High Dose Seasonal PF 01/27/2016, 02/01/2023   Influenza,inj,Quad PF,6+ Mos 01/16/2014, 02/05/2015, 01/10/2017, 01/23/2018   Influenza-Unspecified 01/29/2020   PFIZER Comirnaty(Gray Top)Covid-19 Tri-Sucrose Vaccine 06/29/2020   PFIZER(Purple Top)SARS-COV-2 Vaccination 06/07/2019, 07/02/2019, 12/28/2019, 02/05/2022   Pfizer Covid-19 Vaccine Bivalent Booster 5yrs & up 01/16/2021, 09/23/2021, 01/20/2023   Pfizer(Comirnaty)Fall Seasonal Vaccine 12 years and older 08/13/2022   Pneumococcal Conjugate-13 01/16/2014   Pneumococcal Polysaccharide-23 10/18/2016   Pneumococcal-Unspecified 02/12/2009   RSV,unspecified 03/08/2022   Td 10/27/2005   Tdap 02/05/2015   Typhoid Inactivated 07/17/2007   Zoster Recombinant(Shingrix) 04/11/2020, 06/12/2020   Zoster, Live 06/26/2007    TDAP status: Up to date  Flu Vaccine status: Up to date  Pneumococcal vaccine status: Up to date  Covid-19 vaccine status: Information provided on how to obtain vaccines.   Qualifies for Shingles Vaccine? Yes   Zostavax completed Yes   Shingrix Completed?: Yes  Screening Tests Health Maintenance  Topic Date Due   COVID-19 Vaccine (10 - 2024-25 season) 03/17/2023   DEXA SCAN  04/08/2023   Medicare Annual Wellness (AWV)  04/23/2023   MAMMOGRAM  03/16/2024   DTaP/Tdap/Td (3 - Td or Tdap) 02/04/2025   Colonoscopy  11/10/2026   Pneumonia Vaccine 38+ Years old  Completed   INFLUENZA VACCINE  Completed   Hepatitis C Screening  Completed   Zoster Vaccines- Shingrix  Completed   HPV VACCINES  Aged Out    Health Maintenance  Health Maintenance Due  Topic Date Due   COVID-19 Vaccine (10 - 2024-25 season) 03/17/2023   DEXA SCAN  04/08/2023   Medicare Annual Wellness (AWV)  04/23/2023    Colorectal cancer screening: Type of screening: Colonoscopy. Completed 11/09/21. Repeat every 5 years  Mammogram status:  Completed 03/17/23. Repeat every year  Bone Density status: Completed 04/07/21. Results reflect: Bone density results: OSTEOPENIA. Repeat every 2 years.  Lung Cancer Screening: (Low Dose CT Chest recommended if Age 74-80 years, 20 pack-year currently smoking OR have quit w/in 15years.) does not qualify.   Additional Screening:  Hepatitis C Screening: does qualify; Completed 05/07/16  Vision Screening: Recommended annual ophthalmology exams for early detection of glaucoma and other disorders of the eye. Is the patient up to date with their annual eye exam?  Yes  Who is the provider or what is the name of the office in which the patient attends annual eye exams? Dr. Antony Contras If pt is not established with a provider, would they like to be referred to a provider to establish care? No .   Dental Screening: Recommended annual dental exams for proper oral hygiene  Diabetic Foot Exam: N/a  Community Resource Referral / Chronic Care Management: CRR required this visit?  No   CCM required this visit?  No     Plan:     I have personally reviewed and noted the following in the patient's chart:   Medical and social history Use of alcohol, tobacco or illicit drugs  Current medications and supplements including  opioid prescriptions. Patient is not currently taking opioid prescriptions. Functional ability and status Nutritional status Physical activity Advanced directives List of other physicians Hospitalizations, surgeries, and ER visits in previous 12 months Vitals Screenings to include cognitive, depression, and falls Referrals and appointments  In addition, I have reviewed and discussed with patient certain preventive protocols, quality metrics, and best practice recommendations. A written personalized care plan for preventive services as well as general preventive health recommendations were provided to patient.     Donne Anon, CMA   05/02/2023   After Visit Summary:  (MyChart) Due to this being a telephonic visit, the after visit summary with patients personalized plan was offered to patient via MyChart   Nurse Notes: None

## 2023-07-15 DIAGNOSIS — H52203 Unspecified astigmatism, bilateral: Secondary | ICD-10-CM | POA: Diagnosis not present

## 2023-07-15 DIAGNOSIS — H2513 Age-related nuclear cataract, bilateral: Secondary | ICD-10-CM | POA: Diagnosis not present

## 2023-07-27 DIAGNOSIS — D485 Neoplasm of uncertain behavior of skin: Secondary | ICD-10-CM | POA: Diagnosis not present

## 2023-07-27 DIAGNOSIS — L570 Actinic keratosis: Secondary | ICD-10-CM | POA: Diagnosis not present

## 2023-07-27 DIAGNOSIS — C44622 Squamous cell carcinoma of skin of right upper limb, including shoulder: Secondary | ICD-10-CM | POA: Diagnosis not present

## 2023-08-05 DIAGNOSIS — J385 Laryngeal spasm: Secondary | ICD-10-CM | POA: Diagnosis not present

## 2023-08-09 ENCOUNTER — Other Ambulatory Visit: Payer: Self-pay

## 2023-08-09 DIAGNOSIS — C44622 Squamous cell carcinoma of skin of right upper limb, including shoulder: Secondary | ICD-10-CM | POA: Diagnosis not present

## 2023-08-09 DIAGNOSIS — L821 Other seborrheic keratosis: Secondary | ICD-10-CM | POA: Diagnosis not present

## 2023-08-09 DIAGNOSIS — C8330 Diffuse large B-cell lymphoma, unspecified site: Secondary | ICD-10-CM

## 2023-08-10 ENCOUNTER — Inpatient Hospital Stay: Payer: Medicare Other | Attending: Hematology

## 2023-08-10 ENCOUNTER — Inpatient Hospital Stay (HOSPITAL_BASED_OUTPATIENT_CLINIC_OR_DEPARTMENT_OTHER): Payer: Medicare Other | Admitting: Hematology

## 2023-08-10 VITALS — BP 138/69 | HR 64 | Temp 98.0°F | Resp 18 | Ht 69.0 in | Wt 174.9 lb

## 2023-08-10 DIAGNOSIS — Z823 Family history of stroke: Secondary | ICD-10-CM | POA: Diagnosis not present

## 2023-08-10 DIAGNOSIS — C8338 Diffuse large B-cell lymphoma, lymph nodes of multiple sites: Secondary | ICD-10-CM | POA: Diagnosis not present

## 2023-08-10 DIAGNOSIS — Z825 Family history of asthma and other chronic lower respiratory diseases: Secondary | ICD-10-CM | POA: Insufficient documentation

## 2023-08-10 DIAGNOSIS — Z8349 Family history of other endocrine, nutritional and metabolic diseases: Secondary | ICD-10-CM | POA: Insufficient documentation

## 2023-08-10 DIAGNOSIS — C8330 Diffuse large B-cell lymphoma, unspecified site: Secondary | ICD-10-CM

## 2023-08-10 DIAGNOSIS — Z8249 Family history of ischemic heart disease and other diseases of the circulatory system: Secondary | ICD-10-CM | POA: Insufficient documentation

## 2023-08-10 DIAGNOSIS — Z881 Allergy status to other antibiotic agents status: Secondary | ICD-10-CM | POA: Diagnosis not present

## 2023-08-10 DIAGNOSIS — E782 Mixed hyperlipidemia: Secondary | ICD-10-CM | POA: Insufficient documentation

## 2023-08-10 DIAGNOSIS — I700 Atherosclerosis of aorta: Secondary | ICD-10-CM | POA: Diagnosis not present

## 2023-08-10 DIAGNOSIS — Z7289 Other problems related to lifestyle: Secondary | ICD-10-CM | POA: Insufficient documentation

## 2023-08-10 DIAGNOSIS — Z79899 Other long term (current) drug therapy: Secondary | ICD-10-CM | POA: Diagnosis not present

## 2023-08-10 DIAGNOSIS — Z8261 Family history of arthritis: Secondary | ICD-10-CM | POA: Insufficient documentation

## 2023-08-10 DIAGNOSIS — T451X5A Adverse effect of antineoplastic and immunosuppressive drugs, initial encounter: Secondary | ICD-10-CM | POA: Diagnosis not present

## 2023-08-10 DIAGNOSIS — G620 Drug-induced polyneuropathy: Secondary | ICD-10-CM | POA: Diagnosis not present

## 2023-08-10 LAB — CMP (CANCER CENTER ONLY)
ALT: 20 U/L (ref 0–44)
AST: 19 U/L (ref 15–41)
Albumin: 4.3 g/dL (ref 3.5–5.0)
Alkaline Phosphatase: 101 U/L (ref 38–126)
Anion gap: 8 (ref 5–15)
BUN: 18 mg/dL (ref 8–23)
CO2: 27 mmol/L (ref 22–32)
Calcium: 9.4 mg/dL (ref 8.9–10.3)
Chloride: 103 mmol/L (ref 98–111)
Creatinine: 0.67 mg/dL (ref 0.44–1.00)
GFR, Estimated: >60 mL/min (ref >60)
Glucose, Bld: 100 mg/dL — ABNORMAL HIGH (ref 70–99)
Potassium: 4.5 mmol/L (ref 3.5–5.1)
Sodium: 138 mmol/L (ref 135–145)
Total Bilirubin: 0.5 mg/dL (ref 0.0–1.2)
Total Protein: 7.1 g/dL (ref 6.5–8.1)

## 2023-08-10 LAB — CBC WITH DIFFERENTIAL (CANCER CENTER ONLY)
Abs Immature Granulocytes: 0.01 10*3/uL (ref 0.00–0.07)
Basophils Absolute: 0 10*3/uL (ref 0.0–0.1)
Basophils Relative: 1 %
Eosinophils Absolute: 0.2 10*3/uL (ref 0.0–0.5)
Eosinophils Relative: 2 %
HCT: 40.7 % (ref 36.0–46.0)
Hemoglobin: 13.7 g/dL (ref 12.0–15.0)
Immature Granulocytes: 0 %
Lymphocytes Relative: 17 %
Lymphs Abs: 1.4 10*3/uL (ref 0.7–4.0)
MCH: 30.3 pg (ref 26.0–34.0)
MCHC: 33.7 g/dL (ref 30.0–36.0)
MCV: 90 fL (ref 80.0–100.0)
Monocytes Absolute: 0.7 10*3/uL (ref 0.1–1.0)
Monocytes Relative: 8 %
Neutro Abs: 5.8 10*3/uL (ref 1.7–7.7)
Neutrophils Relative %: 72 %
Platelet Count: 291 10*3/uL (ref 150–400)
RBC: 4.52 MIL/uL (ref 3.87–5.11)
RDW: 13.2 % (ref 11.5–15.5)
WBC Count: 8.1 10*3/uL (ref 4.0–10.5)
nRBC: 0 % (ref 0.0–0.2)

## 2023-08-10 LAB — LACTATE DEHYDROGENASE: LDH: 169 U/L (ref 98–192)

## 2023-08-10 NOTE — Progress Notes (Signed)
 Lindsay Harrison     HEMATOLOGY/ONCOLOGY CLINIC NOTE  Date of Service: 08/10/2023    Patient Care Team: Neda Balk, MD as PCP - General (Family Medicine) Mammography, [facility] (Diagnostic Radiology) Ammon Bales M.D. (ENT)  CHIEF COMPLAINTS/PURPOSE OF CONSULTATION:   F/u for follicular lymphoma/DLBCL  HISTORY OF PRESENTING ILLNESS:   Please see previous note for details of initial presentation.  INTERVAL HISTORY   Lindsay Harrison is a 78 y.o. 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  Today, she reports that she has been doing well overall since her last clinical visit. Patient reports having a squamous cell carcinoma skin cancer lesion removed yesterday from the right shoulder. Patient does reports previously having cryosurgery to a left shoulder skin lesion. She does note a hx of wearing sleeveless shirts frequently. Patient reports no other new lumps/bumps outside of her skin cancer, which was removed.   Patient complains of seasonal allergies.   She reports that she was recently diagnosed with Laryngeal dystonia since her last visit. Patient does receive  gel injections in the vocal chords every 3.5 months, which has made a significant improvement in her vocal symptoms.   Patient denies starting any new medications recently.   Patient reports that she recently traveled to [place]. She plans to travel to [place] next Spring.   MEDICAL HISTORY:  Past Medical History:  Diagnosis Date   Anemia    h/o iron deficiency secondary to heavy menses   Arthritis    left hand index finger   Diffuse large B cell lymphoma (HCC) dx'd 04/2016   Grief reaction 01/27/2016   H/O measles    as a child   History of chicken pox    as a child   History of PCOS    Hyperlipidemia, mixed 01/27/2016   Lymphadenopathy    Peripheral neuropathy 01/31/2017   Preventative health care  01/27/2016   Vitamin D deficiency    Wears glasses     SURGICAL HISTORY: Past Surgical History:  Procedure Laterality Date   COLONOSCOPY     IR GENERIC HISTORICAL  05/21/2016   IR FLUORO GUIDE PORT INSERTION RIGHT 05/21/2016 [name] Silvas, MD [facility]   IR GENERIC HISTORICAL  05/21/2016   IR US  GUIDE VASC ACCESS RIGHT 05/21/2016 [name] Silvas, MD [facility]   IR REMOVAL TUN ACCESS W/ PORT W/O FL MOD SED  10/27/2016   SKIN BIOPSY Left    face   THYROGLOSSAL DUCT CYST N/A 04/16/2016   Procedure: THYROGLOSSAL DUCT CYST excision;  Surgeon: Ammon Bales, MD;  Location: [facility] OR;  Service: ENT;  Laterality: N/A;    SOCIAL HISTORY: Social History   Socioeconomic History   Marital status: Widowed    Spouse name: Not on file   Number of children: Not on file   Years of education: Not on file   Highest education level: Doctorate  Occupational History   Occupation: Psychotherapist  Tobacco Use   Smoking status: Never   Smokeless tobacco: Never  Vaping Use   Vaping status: Never Used  Substance and Sexual Activity   Alcohol use: Yes    Alcohol/week: 3.0 standard drinks of alcohol    Types: 3 Glasses of wine per week    Comment: Minimal   Drug use: No   Sexual activity: Never    Partners: Male    Birth control/protection: None  Other Topics Concern   Not on file  Social History Narrative   Married. Education: [employer]. Exercise: walking, yoga, and bicycling daily for 1-2 hours. Lives alone, works as Airline pilot   Social Drivers of Corporate investment banker Strain: Low Risk  (05/01/2023)   Overall Financial Resource Strain (CARDIA)    Difficulty of Paying Living Expenses: Not hard at all  Food Insecurity: No Food Insecurity (05/01/2023)   Hunger Vital Sign    Worried About Running Out of Food in the Last Year: Never true    Ran Out of Food in the Last Year: Never true  Transportation Needs: No Transportation Needs (05/01/2023)   PRAPARE - Doctor, general practice (Medical): No    Lack of Transportation (Non-Medical): No  Physical Activity: Sufficiently Active (05/01/2023)   Exercise Vital Sign    Days of Exercise per Week: 7 days    Minutes of Exercise per Session: 150+ min  Stress: No Stress Concern Present (10/25/2022)   [employer] of Occupational Health - Occupational Stress Questionnaire    Feeling of Stress : Not at all  Social Connections: Moderately Integrated (05/01/2023)   Social Connection and Isolation Panel [NHANES]    Frequency of Communication with Friends and Family: More than three times a week    Frequency of Social Gatherings with Friends and Family: More than three times a week    Attends Religious Services: More than 4 times per year    Active Member of [employer] or Organizations: Yes    Attends Banker Meetings: More than 4 times per year    Marital Status: Widowed  Intimate Partner Violence: Not At Risk (05/02/2023)   Humiliation, Afraid, Rape, and Kick questionnaire    Fear of Current or Ex-Partner: No    Emotionally Abused: No    Physically Abused: No    Sexually Abused: No    FAMILY HISTORY: Family History  Problem Relation Age of Onset   Arthritis Mother        rheumatoid   Heart disease Father        CHF   COPD Sister        Emphysema, h/o cigarettes   Other Sister        pituatary tumor   Arthritis Sister    Obesity Sister    Cancer Paternal Grandfather        cancer   Stroke Maternal Aunt     ALLERGIES:  is allergic to erythromycin and other.  MEDICATIONS:  Current Outpatient Medications  Medication Sig Dispense Refill   Calcium-Magnesium-Vitamin D (CALCIUM MAGNESIUM PO) Take by mouth.     Cholecalciferol (VITAMIN D) 2000 units CAPS Take 2,000 Units by mouth daily.     ELDERBERRY PO Take by mouth.     Multiple Vitamins-Minerals (ZINC PO) Take by mouth.     Omega-3 Fatty Acids (OMEGA-3 PO) Take 2 capsules by mouth daily. Omega 3 1280 mg     polyethylene  glycol-electrolytes (NULYTELY) 420 g solution Use as directed 4000 mL 0   Vitamin D, Ergocalciferol, (DRISDOL) 1.25 MG (50000 UNIT) CAPS capsule Take 1 capsule by mouth every 7 days. 5 capsule 5   vitamin E 180 MG (400 UNITS) capsule Take 400 Units by mouth daily.     No current facility-administered medications for this visit.    REVIEW OF SYSTEMS:    10 Point review of Systems was done is negative except as noted above.   PHYSICAL EXAMINATION:  ECOG PERFORMANCE STATUS: 0 - Asymptomatic  Vitals:  08/10/23 1025  BP: 138/69  Pulse: 64  Resp: 18  Temp: 98 F (36.7 C)  SpO2: 100%    Filed Weights   08/10/23 1025  Weight: 174 lb 14.4 oz (79.3 kg)    .Body mass index is 25.83 kg/m.    GENERAL:alert, in no acute distress and comfortable SKIN: no acute rashes, no significant lesions EYES: conjunctiva are pink and non-injected, sclera anicteric OROPHARYNX: MMM, no exudates, no oropharyngeal erythema or ulceration NECK: supple, no JVD LYMPH:  no palpable lymphadenopathy in the cervical, axillary or inguinal regions LUNGS: clear to auscultation b/l with normal respiratory effort HEART: regular rate & rhythm ABDOMEN:  normoactive bowel sounds , non tender, not distended. Extremity: no pedal edema PSYCH: alert & oriented x 3 with fluent speech NEURO: no focal motor/sensory deficits   LABORATORY DATA:  I have reviewed the data as listed  .    Latest Ref Rng & Units 10/28/2022    9:41 AM 08/10/2022    9:53 AM 04/08/2022   10:53 AM  CBC  WBC 4.0 - 10.5 K/uL 4.3  4.7  5.9   Hemoglobin 12.0 - 15.0 g/dL 16.1  09.6  04.5   Hematocrit 36.0 - 46.0 % 43.2  41.9  42.0   Platelets 150.0 - 400.0 K/uL 282.0  269  293.0    CBC    Component Value Date/Time   WBC 4.3 10/28/2022 0941   RBC 4.68 10/28/2022 0941   HGB 14.2 10/28/2022 0941   HGB 14.1 08/10/2022 0953   HGB 13.3 03/07/2017 0842   HCT 43.2 10/28/2022 0941   HCT 39.1 03/07/2017 0842   PLT 282.0 10/28/2022 0941   PLT  269 08/10/2022 0953   PLT 283 03/07/2017 0842   MCV 92.4 10/28/2022 0941   MCV 91.4 03/07/2017 0842   MCH 31.0 08/10/2022 0953   MCHC 32.9 10/28/2022 0941   RDW 13.5 10/28/2022 0941   RDW 13.3 03/07/2017 0842   LYMPHSABS 1.5 10/28/2022 0941   LYMPHSABS 0.4 (L) 03/07/2017 0842   MONOABS 0.3 10/28/2022 0941   MONOABS 0.7 03/07/2017 0842   EOSABS 0.1 10/28/2022 0941   EOSABS 0.0 03/07/2017 0842   BASOSABS 0.0 10/28/2022 0941   BASOSABS 0.0 03/07/2017 0842    .    Latest Ref Rng & Units 10/28/2022    9:41 AM 08/10/2022    9:53 AM 04/08/2022   10:53 AM  CMP  Glucose 70 - 99 mg/dL 89  86  91   BUN 6 - 23 mg/dL 12  16  15    Creatinine 0.40 - 1.20 mg/dL 4.09  8.11  9.14   Sodium 135 - 145 mEq/L 142  140  138   Potassium 3.5 - 5.1 mEq/L 4.9  4.4  4.7   Chloride 96 - 112 mEq/L 104  104  102   CO2 19 - 32 mEq/L 32  31  31   Calcium 8.4 - 10.5 mg/dL 9.9  9.8  9.6   Total Protein 6.0 - 8.3 g/dL 6.7  6.7  6.6   Total Bilirubin 0.2 - 1.2 mg/dL 0.8  0.7  0.6   Alkaline Phos 39 - 117 U/L 92  116  102   AST 0 - 37 U/L 16  14  13    ALT 0 - 35 U/L 17  14  16     Component     Latest Ref Rng & Units 05/07/2016  Hepatitis B Surface Ag     Negative Negative  Hep B Core  Ab, Tot     Negative Negative  Hep C Virus Ab     0.0 - 0.9 s/co ratio <0.1  .  Lab Results  Component Value Date   LDH 162 08/10/2022         RADIOGRAPHIC STUDIES:   ASSESSMENT & PLAN:   78 y.o. 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  Initial PET/CT showed Scattered small mildly hypermetabolic lymph nodes within the neck, left hilum, left axilla and left groin, potentially related the patient's lymphoma. Focal hypermetabolic activity within the left sternal manubrium and left iliac bone, also potentially related to the patient's lymphoma. No evidence of solid visceral organ involvement.  CT guided  bone marrow biopsy done -- no evidence of lymphoma involving Bone marrow. Normal LDH. No constitutional symptoms. Patient has no significant medical comorbidities at baseline. ECHO with nl EF as expected.  Patient has completed 6 cycles of R CHOP PET/CT s/p 6 cycles of R-CHOP shows complete metabolic response CT chest/abd/pelvis 07/07/2017 - showed no radipgraphic evidence of lymphoma progression at this time.  Lab Results  Component Value Date   LDH 162 08/10/2022   11/06/2018 CT C/A/P with results revealing "1. No evidence of recurrent lymphoma. 2.  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 No issues with dexamethasone pretreatment -Patient will continue dexamethasone premedication to reduce risk of Rituxan hypersensitivity.  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 CBC normal, showed WBC of 8.1K, hemoglobin of 13.7, and platelets of 291K. -CMP stable -LDH . Lab Results  Component Value Date   LDH 169 08/10/2023   -No lab or clinical evidence of FL recurrence at this time. Will continue watchful observation.  -no indication to repeat scans unless there are any new concerns -patient shall return to visit in 1 year  FOLLOW-UP: RTC with Dr Salomon Cree with labs in 12 months  The total time spent in the appointment was 20q minutes* .  All of the patient's questions were answered with apparent satisfaction. The patient knows to call the clinic with any problems, questions or concerns.   Jacquelyn Matt MD Lindsay AAHIVMS [facility] Hematology/Oncology Physician [facility]  .*Total Encounter Time as defined by the Centers for Medicare and Medicaid Services includes, in addition to the  face-to-face time of a patient visit (documented in the note above) non-face-to-face time: obtaining and reviewing outside history, ordering and reviewing medications, tests or procedures, care coordination (communications with other health care professionals or caregivers) and documentation in the medical record.    I,Mitra Faeizi,acting as a Neurosurgeon for Jacquelyn Matt, MD.,have documented all relevant documentation on the behalf of Jacquelyn Matt, MD,as directed by  Jacquelyn Matt, MD while in the presence of Jacquelyn Matt, MD.  .I have reviewed the above documentation for accuracy and completeness, and I agree with the above. .Leeon Makar Kishore Beza Steppe MD

## 2023-08-11 ENCOUNTER — Encounter: Payer: Self-pay | Admitting: Hematology

## 2023-09-18 NOTE — Assessment & Plan Note (Addendum)
 Patient encouraged to maintain heart healthy diet, regular exercise, adequate sleep. Consider daily probiotics. Take medications as prescribed. Labs ordered and reviewed. Given and reviewed copy of ACP documents from [facility] Secretary of [place] and encouraged to complete and return  Mgm 03/2023 repeat in 1-2 years Dexa 04/2021 repeat in 2025 or 2026 Colonoscopy 2023 Pap Normal 2020, repeat in 2025

## 2023-09-18 NOTE — Assessment & Plan Note (Signed)
 Hydrate and monitor

## 2023-09-18 NOTE — Assessment & Plan Note (Signed)
 Encourage heart healthy diet such as MIND or DASH diet, increase exercise, avoid trans fats, simple carbohydrates and processed foods, consider a krill or fish or flaxseed oil cap daily.

## 2023-09-18 NOTE — Assessment & Plan Note (Signed)
 hgba1c acceptable, minimize simple carbs. Increase exercise as tolerated.

## 2023-09-18 NOTE — Assessment & Plan Note (Signed)
 Supplement and monitor

## 2023-09-18 NOTE — Assessment & Plan Note (Signed)
 Following with oncology

## 2023-09-19 ENCOUNTER — Encounter: Payer: Self-pay | Admitting: Family Medicine

## 2023-09-19 ENCOUNTER — Ambulatory Visit (INDEPENDENT_AMBULATORY_CARE_PROVIDER_SITE_OTHER): Payer: Medicare Other | Admitting: Family Medicine

## 2023-09-19 VITALS — BP 124/82 | HR 69 | Temp 97.8°F | Resp 16 | Ht 69.0 in | Wt 170.2 lb

## 2023-09-19 DIAGNOSIS — R252 Cramp and spasm: Secondary | ICD-10-CM

## 2023-09-19 DIAGNOSIS — E782 Mixed hyperlipidemia: Secondary | ICD-10-CM | POA: Diagnosis not present

## 2023-09-19 DIAGNOSIS — C8298 Follicular lymphoma, unspecified, lymph nodes of multiple sites: Secondary | ICD-10-CM | POA: Diagnosis not present

## 2023-09-19 DIAGNOSIS — E559 Vitamin D deficiency, unspecified: Secondary | ICD-10-CM | POA: Diagnosis not present

## 2023-09-19 DIAGNOSIS — R739 Hyperglycemia, unspecified: Secondary | ICD-10-CM | POA: Diagnosis not present

## 2023-09-19 DIAGNOSIS — Z0000 Encounter for general adult medical examination without abnormal findings: Secondary | ICD-10-CM

## 2023-09-19 NOTE — Progress Notes (Signed)
 Subjective:    [name], female    DOB: Nov 22, 1945, 79 y.o.   MRN: [id number]  Chief Complaint  Patient presents with   Annual Exam    Patient presents today for a physical exam.   Quality Metric Gaps    DEXA scan    HPI  Patient is a 78 year old female for annual physical exam and follow up on chronic medical concerns. No recent febrile illness or hospitalizations. Denies CP/palp/SOB/HA/congestion/fevers/GI or GU c/o. Taking meds as prescribed. She continues to stay very active and is scheduled to hike [place] later this year. She hydrates well and maintains good protein intake. She gets adequate rest and maintains a heart healthy diet.   History of Present Illness     Past Medical History:  Diagnosis Date   Anemia    h/o iron deficiency secondary to heavy menses   Arthritis    left hand index finger   Diffuse large B cell lymphoma (HCC) dx'd 04/2016   Grief reaction 01/27/2016   H/O measles    as a child   History of chicken pox    as a child   History of PCOS    Hyperlipidemia, mixed 01/27/2016   Lymphadenopathy    Peripheral neuropathy 01/31/2017   Preventative health care 01/27/2016   Vitamin D  deficiency    Wears glasses     Past Surgical History:  Procedure Laterality Date   COLONOSCOPY     IR GENERIC HISTORICAL  05/21/2016   IR FLUORO GUIDE PORT INSERTION RIGHT 05/21/2016 [name] Silvas, MD [facility]   IR GENERIC HISTORICAL  05/21/2016   IR US  GUIDE VASC ACCESS RIGHT 05/21/2016 [name] Silvas, MD [facility]   IR REMOVAL TUN ACCESS W/ PORT W/O FL MOD SED  10/27/2016   SKIN BIOPSY Left    face   THYROGLOSSAL DUCT CYST N/A 04/16/2016   Procedure: THYROGLOSSAL DUCT CYST excision;  Surgeon: Ammon Bales, MD;  Location: [facility] OR;  Service: ENT;  Laterality: N/A;    Family History  Problem Relation Age of Onset   Arthritis Mother        rheumatoid   Heart disease Father        CHF   COPD Sister        Emphysema, h/o cigarettes    Other Sister        pituatary tumor   Arthritis Sister    Obesity Sister    Cancer Paternal Grandfather        cancer   Stroke Maternal Aunt     Social History   Socioeconomic History   Marital status: Widowed    Spouse name: Not on file   Number of children: Not on file   Years of education: Not on file   Highest education level: Doctorate  Occupational History   Occupation: Psychotherapist  Tobacco Use   Smoking status: Never   Smokeless tobacco: Never  Vaping Use   Vaping status: Never Used  Substance and Sexual Activity   Alcohol use: Yes    Alcohol/week: 3.0 standard drinks of alcohol    Types: 3 Glasses of wine per week    Comment: Minimal   Drug use: No   Sexual activity: Never    Partners: Male    Birth control/protection: None  Other Topics Concern   Not on file  Social History Narrative   Married. Education: [employer]. Exercise: walking, yoga, and bicycling daily for 1-2 hours. Lives alone, works as Airline pilot  Social Drivers of Corporate investment banker Strain: Low Risk  (05/01/2023)   Overall Financial Resource Strain (CARDIA)    Difficulty of Paying Living Expenses: Not hard at all  Food Insecurity: No Food Insecurity (05/01/2023)   Hunger Vital Sign    Worried About Running Out of Food in the Last Year: Never true    Ran Out of Food in the Last Year: Never true  Transportation Needs: No Transportation Needs (05/01/2023)   PRAPARE - Administrator, Civil Service (Medical): No    Lack of Transportation (Non-Medical): No  Physical Activity: Sufficiently Active (05/01/2023)   Exercise Vital Sign    Days of Exercise per Week: 7 days    Minutes of Exercise per Session: 150+ min  Stress: No Stress Concern Present (10/25/2022)   [employer] of Occupational Health - Occupational Stress Questionnaire    Feeling of Stress : Not at all  Social Connections: Moderately Integrated (05/01/2023)   Social Connection and Isolation Panel  [NHANES]    Frequency of Communication with Friends and Family: More than three times a week    Frequency of Social Gatherings with Friends and Family: More than three times a week    Attends Religious Services: More than 4 times per year    Active Member of [employer] or Organizations: Yes    Attends Banker Meetings: More than 4 times per year    Marital Status: Widowed  Intimate Partner Violence: Not At Risk (05/02/2023)   Humiliation, Afraid, Rape, and Kick questionnaire    Fear of Current or Ex-Partner: No    Emotionally Abused: No    Physically Abused: No    Sexually Abused: No    Outpatient Medications Prior to Visit  Medication Sig Dispense Refill   Calcium-Magnesium-Vitamin D  (CALCIUM MAGNESIUM PO) Take by mouth.     Cholecalciferol (VITAMIN D ) 2000 units CAPS Take 2,000 Units by mouth daily.     ELDERBERRY PO Take by mouth.     Multiple Vitamins-Minerals (ZINC PO) Take by mouth.     Omega-3 Fatty Acids (OMEGA-3 PO) Take 2 capsules by mouth daily. Omega 3 1280 mg     polyethylene glycol-electrolytes (NULYTELY) 420 g solution Use as directed 4000 mL 0   Vitamin D , Ergocalciferol , (DRISDOL ) 1.25 MG (50000 UNIT) CAPS capsule Take 1 capsule by mouth every 7 days. 5 capsule 5   vitamin E 180 MG (400 UNITS) capsule Take 400 Units by mouth daily.     No facility-administered medications prior to visit.    Allergies  Allergen Reactions   Erythromycin Other (See Comments)    Patient states that she passed out while using this medication ? SYNCOPE ?   Other Anaphylaxis    # # # CATS # # #    Review of Systems  Constitutional:  Negative for chills, fever and malaise/fatigue.  HENT:  Negative for congestion and hearing loss.   Eyes:  Negative for discharge.  Respiratory:  Negative for cough, sputum production and shortness of breath.   Cardiovascular:  Negative for chest pain, palpitations and leg swelling.  Gastrointestinal:  Negative for abdominal pain, blood in  stool, constipation, diarrhea, heartburn, nausea and vomiting.  Genitourinary:  Negative for dysuria, frequency, hematuria and urgency.  Musculoskeletal:  Negative for back pain, falls and myalgias.  Skin:  Negative for rash.  Neurological:  Negative for dizziness, sensory change, loss of consciousness, weakness and headaches.  Endo/Heme/Allergies:  Negative for environmental allergies. Does not bruise/bleed easily.  Psychiatric/Behavioral:  Negative for depression and suicidal ideas. The patient is not nervous/anxious and does not have insomnia.        Objective:     Physical Exam Constitutional:      General: She is not in acute distress.    Appearance: Normal appearance. She is not diaphoretic.  HENT:     Head: Normocephalic and atraumatic.     Right Ear: Tympanic membrane, ear canal and external ear normal.     Left Ear: Tympanic membrane, ear canal and external ear normal.     Nose: Nose normal.     Mouth/Throat:     Mouth: Mucous membranes are moist.     Pharynx: Oropharynx is clear. No oropharyngeal exudate.  Eyes:     General: No scleral icterus.       Right eye: No discharge.        Left eye: No discharge.     Conjunctiva/sclera: Conjunctivae normal.     Pupils: Pupils are equal, round, and reactive to light.  Neck:     Thyroid : No thyromegaly.  Cardiovascular:     Rate and Rhythm: Normal rate and regular rhythm.     Heart sounds: Normal heart sounds. No murmur heard. Pulmonary:     Effort: Pulmonary effort is normal. No respiratory distress.     Breath sounds: Normal breath sounds. No wheezing or rales.  Abdominal:     General: Bowel sounds are normal. There is no distension.     Palpations: Abdomen is soft. There is no mass.     Tenderness: There is no abdominal tenderness.  Musculoskeletal:        General: No tenderness. Normal range of motion.     Cervical back: Normal range of motion and neck supple.  Lymphadenopathy:     Cervical: No cervical adenopathy.   Skin:    General: Skin is warm and dry.     Findings: No rash.  Neurological:     General: No focal deficit present.     Mental Status: She is alert and oriented to person, place, and time.     Cranial Nerves: No cranial nerve deficit.     Coordination: Coordination normal.     Deep Tendon Reflexes: Reflexes are normal and symmetric. Reflexes normal.  Psychiatric:        Mood and Affect: Mood normal.        Behavior: Behavior normal.        Thought Content: Thought content normal.        Judgment: Judgment normal.     BP 124/82   Pulse 69   Temp 97.8 F (36.6 C)   Resp 16   Ht 5\' 9"  (1.753 m)   Wt 170 lb 3.2 oz (77.2 kg)   SpO2 95%   BMI 25.13 kg/m  Wt Readings from Last 3 Encounters:  09/19/23 170 lb 3.2 oz (77.2 kg)  08/10/23 174 lb 14.4 oz (79.3 kg)  10/28/22 164 lb 6.4 oz (74.6 kg)    Diabetic Foot Exam - Simple   No data filed    Lab Results  Component Value Date   WBC 8.1 08/10/2023   HGB 13.7 08/10/2023   HCT 40.7 08/10/2023   PLT 291 08/10/2023   GLUCOSE 100 (H) 08/10/2023   CHOL 191 10/28/2022   TRIG 74.0 10/28/2022   HDL 67.20 10/28/2022   LDLCALC 109 (H) 10/28/2022   ALT 20 08/10/2023   AST 19 08/10/2023   NA 138 08/10/2023   K  4.5 08/10/2023   CL 103 08/10/2023   CREATININE 0.67 08/10/2023   BUN 18 08/10/2023   CO2 27 08/10/2023   TSH 1.27 10/28/2022   INR 1.00 10/27/2016   HGBA1C 5.7 10/28/2022    Lab Results  Component Value Date   TSH 1.27 10/28/2022   Lab Results  Component Value Date   WBC 8.1 08/10/2023   HGB 13.7 08/10/2023   HCT 40.7 08/10/2023   MCV 90.0 08/10/2023   PLT 291 08/10/2023   Lab Results  Component Value Date   NA 138 08/10/2023   K 4.5 08/10/2023   CHLORIDE 106 03/07/2017   CO2 27 08/10/2023   GLUCOSE 100 (H) 08/10/2023   BUN 18 08/10/2023   CREATININE 0.67 08/10/2023   BILITOT 0.5 08/10/2023   ALKPHOS 101 08/10/2023   AST 19 08/10/2023   ALT 20 08/10/2023   PROT 7.1 08/10/2023   ALBUMIN 4.3  08/10/2023   CALCIUM 9.4 08/10/2023   ANIONGAP 8 08/10/2023   EGFR >60 03/07/2017   GFR 85.42 10/28/2022   Lab Results  Component Value Date   CHOL 191 10/28/2022   Lab Results  Component Value Date   HDL 67.20 10/28/2022   Lab Results  Component Value Date   LDLCALC 109 (H) 10/28/2022   Lab Results  Component Value Date   TRIG 74.0 10/28/2022   Lab Results  Component Value Date   CHOLHDL 3 10/28/2022   Lab Results  Component Value Date   HGBA1C 5.7 10/28/2022       Assessment & Plan:  Hyperglycemia Assessment & Plan: hgba1c acceptable, minimize simple carbs. Increase exercise as tolerated.   Orders: -     Hemoglobin A1c  Hyperlipidemia, mixed Assessment & Plan: Encourage heart healthy diet such as MIND or DASH diet, increase exercise, avoid trans fats, simple carbohydrates and processed foods, consider a krill or fish or flaxseed oil cap daily.   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 Consider daily probiotics. Take medications as prescribed. Labs ordered and reviewed. Given and reviewed copy of ACP documents from [facility] Secretary of [place] and encouraged to complete and return  Mgm 03/2023 repeat in 1-2 years Dexa 04/2021 repeat in 2025 or 2026 Colonoscopy 2023 Pap Normal 2020, repeat in 2025           Randie Bustle, MD

## 2023-09-19 NOTE — Patient Instructions (Addendum)
 Need a copy of your advanced directives in your chart  Preventive Care 65 Years and Older, Female Preventive care refers to lifestyle choices and visits with your health care provider that can promote health and wellness. Preventive care visits are also called wellness exams. What can I expect for my preventive care visit? Counseling Your health care provider may ask you questions about your: Medical history, including: Past medical problems. Family medical history. Pregnancy and menstrual history. History of falls. Current health, including: Memory and ability to understand (cognition). Emotional well-being. Home life and relationship well-being. Sexual activity and sexual health. Lifestyle, including: Alcohol, nicotine or tobacco, and drug use. Access to firearms. Diet, exercise, and sleep habits. Work and work Astronomer. Sunscreen use. Safety issues such as seatbelt and bike helmet use. Physical exam Your health care provider will check your: Height and weight. These may be used to calculate your BMI (body mass index). BMI is a measurement that tells if you are at a healthy weight. Waist circumference. This measures the distance around your waistline. This measurement also tells if you are at a healthy weight and may help predict your risk of certain diseases, such as type 2 diabetes and high blood pressure. Heart rate and blood pressure. Body temperature. Skin for abnormal spots. What immunizations do I need?  Vaccines are usually given at various ages, according to a schedule. Your health care provider will recommend vaccines for you based on your age, medical history, and lifestyle or other factors, such as travel or where you work. What tests do I need? Screening Your health care provider may recommend screening tests for certain conditions. This may include: Lipid and cholesterol levels. Hepatitis C test. Hepatitis B test. HIV (human immunodeficiency virus)  test. STI (sexually transmitted infection) testing, if you are at risk. Lung cancer screening. Colorectal cancer screening. Diabetes screening. This is done by checking your blood sugar (glucose) after you have not eaten for a while (fasting). Mammogram. Talk with your health care provider about how often you should have regular mammograms. BRCA-related cancer screening. This may be done if you have a family history of breast, ovarian, tubal, or peritoneal cancers. Bone density scan. This is done to screen for osteoporosis. Talk with your health care provider about your test results, treatment options, and if necessary, the need for more tests. Follow these instructions at home: Eating and drinking  Eat a diet that includes fresh fruits and vegetables, whole grains, lean protein, and low-fat dairy products. Limit your intake of foods with high amounts of sugar, saturated fats, and salt. Take vitamin and mineral supplements as recommended by your health care provider. Do not drink alcohol if your health care provider tells you not to drink. If you drink alcohol: Limit how much you have to 0-1 drink a day. Know how much alcohol is in your drink. In the [place] one drink equals one 12 oz bottle of beer (355 mL), one 5 oz glass of wine (148 mL), or one 1 oz glass of hard liquor (44 mL). Lifestyle Brush your teeth every morning and night with fluoride toothpaste. Floss one time each day. Exercise for at least 30 minutes 5 or more days each week. Do not use any products that contain nicotine or tobacco. These products include cigarettes, chewing tobacco, and vaping devices, such as e-cigarettes. If you need help quitting, ask your health care provider. Do not use drugs. If you are sexually active, practice safe sex. Use a condom or other form of protection  in order to prevent STIs. Take aspirin only as told by your health care provider. Make sure that you understand how much to take and what form to  take. Work with your health care provider to find out whether it is safe and beneficial for you to take aspirin daily. Ask your health care provider if you need to take a cholesterol-lowering medicine (statin). Find healthy ways to manage stress, such as: Meditation, yoga, or listening to music. Journaling. Talking to a trusted person. Spending time with friends and family. Minimize exposure to UV radiation to reduce your risk of skin cancer. Safety Always wear your seat belt while driving or riding in a vehicle. Do not drive: If you have been drinking alcohol. Do not ride with someone who has been drinking. When you are tired or distracted. While texting. If you have been using any mind-altering substances or drugs. Wear a helmet and other protective equipment during sports activities. If you have firearms in your house, make sure you follow all gun safety procedures. What's next? Visit your health care provider once a year for an annual wellness visit. Ask your health care provider how often you should have your eyes and teeth checked. Stay up to date on all vaccines. This information is not intended to replace advice given to you by your health care provider. Make sure you discuss any questions you have with your health care provider. Document Revised: 10/15/2020 Document Reviewed: 10/15/2020 Elsevier Patient Education  2024 [employer].

## 2023-09-20 ENCOUNTER — Ambulatory Visit: Payer: Self-pay | Admitting: Family Medicine

## 2023-09-20 ENCOUNTER — Encounter: Payer: Medicare Other | Admitting: Family Medicine

## 2023-09-20 LAB — COMPREHENSIVE METABOLIC PANEL WITH GFR
ALT: 17 U/L (ref 0–35)
AST: 17 U/L (ref 0–37)
Albumin: 4.4 g/dL (ref 3.5–5.2)
Alkaline Phosphatase: 93 U/L (ref 39–117)
BUN: 18 mg/dL (ref 6–23)
CO2: 28 meq/L (ref 19–32)
Calcium: 9.5 mg/dL (ref 8.4–10.5)
Chloride: 103 meq/L (ref 96–112)
Creatinine, Ser: 0.73 mg/dL (ref 0.40–1.20)
GFR: 79.29 mL/min (ref >60.00)
Glucose, Bld: 92 mg/dL (ref 70–99)
Potassium: 4.6 meq/L (ref 3.5–5.1)
Sodium: 139 meq/L (ref 135–145)
Total Bilirubin: 0.4 mg/dL (ref 0.2–1.2)
Total Protein: 6.6 g/dL (ref 6.0–8.3)

## 2023-09-20 LAB — VITAMIN D 25 HYDROXY (VIT D DEFICIENCY, FRACTURES): VITD: 19.24 ng/mL — ABNORMAL LOW (ref 30.00–100.00)

## 2023-09-20 LAB — CBC WITH DIFFERENTIAL/PLATELET
Basophils Absolute: 0.1 K/uL (ref 0.0–0.1)
Basophils Relative: 0.9 % (ref 0.0–3.0)
Eosinophils Absolute: 0.2 K/uL (ref 0.0–0.7)
Eosinophils Relative: 2.6 % (ref 0.0–5.0)
HCT: 40.7 % (ref 36.0–46.0)
Hemoglobin: 13.7 g/dL (ref 12.0–15.0)
Lymphocytes Relative: 29.8 % (ref 12.0–46.0)
Lymphs Abs: 2 K/uL (ref 0.7–4.0)
MCHC: 33.8 g/dL (ref 30.0–36.0)
MCV: 90.5 fl (ref 78.0–100.0)
Monocytes Absolute: 0.5 K/uL (ref 0.1–1.0)
Monocytes Relative: 7.5 % (ref 3.0–12.0)
Neutro Abs: 3.9 K/uL (ref 1.4–7.7)
Neutrophils Relative %: 59.2 % (ref 43.0–77.0)
Platelets: 246 K/uL (ref 150.0–400.0)
RBC: 4.49 Mil/uL (ref 3.87–5.11)
RDW: 13.4 % (ref 11.5–15.5)
WBC: 6.6 K/uL (ref 4.0–10.5)

## 2023-09-20 LAB — LIPID PANEL
Cholesterol: 225 mg/dL — ABNORMAL HIGH (ref 0–200)
HDL: 74.7 mg/dL (ref >39.00)
LDL Cholesterol: 121 mg/dL — ABNORMAL HIGH (ref 0–99)
NonHDL: 150
Total CHOL/HDL Ratio: 3
Triglycerides: 144 mg/dL (ref 0.0–149.0)
VLDL: 28.8 mg/dL (ref 0.0–40.0)

## 2023-09-20 LAB — MAGNESIUM: Magnesium: 2.2 mg/dL (ref 1.5–2.5)

## 2023-09-20 LAB — HEMOGLOBIN A1C: Hgb A1c MFr Bld: 5.7 % (ref 4.6–6.5)

## 2023-09-20 LAB — TSH: TSH: 1.33 u[IU]/mL (ref 0.35–5.50)

## 2023-10-06 DIAGNOSIS — M25561 Pain in right knee: Secondary | ICD-10-CM | POA: Diagnosis not present

## 2023-10-06 DIAGNOSIS — G8929 Other chronic pain: Secondary | ICD-10-CM | POA: Diagnosis not present

## 2023-11-11 DIAGNOSIS — J385 Laryngeal spasm: Secondary | ICD-10-CM | POA: Diagnosis not present

## 2023-12-05 DIAGNOSIS — L570 Actinic keratosis: Secondary | ICD-10-CM | POA: Diagnosis not present

## 2024-02-10 ENCOUNTER — Encounter: Payer: Self-pay | Admitting: Family Medicine

## 2024-02-12 ENCOUNTER — Other Ambulatory Visit: Payer: Self-pay | Admitting: Family Medicine

## 2024-02-12 DIAGNOSIS — E2839 Other primary ovarian failure: Secondary | ICD-10-CM

## 2024-02-17 DIAGNOSIS — J383 Other diseases of vocal cords: Secondary | ICD-10-CM | POA: Diagnosis not present

## 2024-02-17 DIAGNOSIS — J385 Laryngeal spasm: Secondary | ICD-10-CM | POA: Diagnosis not present

## 2024-03-07 ENCOUNTER — Other Ambulatory Visit (HOSPITAL_BASED_OUTPATIENT_CLINIC_OR_DEPARTMENT_OTHER)

## 2024-03-12 ENCOUNTER — Ambulatory Visit: Payer: Self-pay | Admitting: Family Medicine

## 2024-03-12 ENCOUNTER — Ambulatory Visit (HOSPITAL_BASED_OUTPATIENT_CLINIC_OR_DEPARTMENT_OTHER)
Admission: RE | Admit: 2024-03-12 | Discharge: 2024-03-12 | Disposition: A | Discharge status: Home / Self Care | Source: Ambulatory Visit | Attending: Family Medicine | Admitting: Family Medicine

## 2024-03-12 DIAGNOSIS — E2839 Other primary ovarian failure: Secondary | ICD-10-CM | POA: Diagnosis not present

## 2024-03-12 DIAGNOSIS — Z780 Asymptomatic menopausal state: Secondary | ICD-10-CM | POA: Diagnosis not present

## 2024-03-18 NOTE — Assessment & Plan Note (Signed)
 Supplement and monitor

## 2024-03-18 NOTE — Progress Notes (Unsigned)
 Subjective:    [name], female    DOB: [birth year], 78 y.o.   MRN: [id number]  No chief complaint on file.   HPI Discussed the use of AI scribe software for clinical note transcription with the patient, who gave verbal consent to proceed.  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  She has been experiencing persistent cough and voice changes following her treatments for laryngeal dystonia. She has undergone three treatments, administered every fourteen weeks. After the first two treatments, she experienced whispering voice and coughing for about four days starting on the Monday or Tuesday following the Friday treatment. This time, the whispering voice began on Monday and has persisted, although her voice is better now than a week ago. A week ago, she could not shout, but now she can if needed.  The cough is particularly bothersome at night. She recently visited friends in [place] who have cats, which exacerbated her symptoms despite efforts to minimize exposure. The cough was more pronounced during this visit. No trouble with swallowing.  She is currently taking vitamin D  supplements, with a recent increase to 5000 IU daily due to previously low levels. She is inconsistent with taking pills but has started taking vitamin D  regularly.  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 She frequently visits friends in [place] and is planning a trip to [place].    Past Medical History:  Diagnosis Date   Anemia    h/o iron deficiency secondary to heavy menses   Arthritis    left hand index finger   Diffuse large B cell lymphoma (HCC) dx'd 04/2016   Grief reaction 01/27/2016   H/O measles    as a child   History of chicken pox    as a child   History of PCOS    Hyperlipidemia, mixed 01/27/2016   Lymphadenopathy    Peripheral  neuropathy 01/31/2017   Preventative health care 01/27/2016   Vitamin D  deficiency    Wears glasses     Past Surgical History:  Procedure Laterality Date   COLONOSCOPY     IR GENERIC HISTORICAL  05/21/2016   IR FLUORO GUIDE PORT INSERTION RIGHT 05/21/2016 Toribio Faes, MD [facility]   IR GENERIC HISTORICAL  05/21/2016   IR US  GUIDE VASC ACCESS RIGHT 05/21/2016 Toribio Faes, MD [facility]   IR REMOVAL TUN ACCESS W/ PORT W/O FL MOD SED  10/27/2016   SKIN BIOPSY Left    face   THYROGLOSSAL DUCT CYST N/A 04/16/2016   Procedure: THYROGLOSSAL DUCT CYST excision;  Surgeon: Alm Bouche, MD;  Location: [facility] OR;  Service: ENT;  Laterality: N/A;    Family History  Problem Relation Age of Onset   Arthritis Mother        rheumatoid   Heart disease Father        CHF   COPD Sister        Emphysema, h/o cigarettes   Other Sister        pituatary tumor   Arthritis Sister    Obesity Sister    Cancer Paternal Grandfather        cancer   Stroke Maternal Aunt     Social History   Socioeconomic History   Marital status: Widowed    Spouse name: Not on file   Number of children: Not on file   Years of education:  Not on file   Highest education level: Doctorate  Occupational History   Occupation: Psychotherapist  Tobacco Use   Smoking status: Never   Smokeless tobacco: Never  Vaping Use   Vaping status: Never Used  Substance and Sexual Activity   Alcohol use: Yes    Alcohol/week: 3.0 standard drinks of alcohol    Types: 3 Glasses of wine per week    Comment: Minimal   Drug use: No   Sexual activity: Never    Partners: Male    Birth control/protection: None  Other Topics Concern   Not on file  Social History Narrative   Married. Education: [employer]. Exercise: walking, yoga, and bicycling daily for 1-2 hours. Lives alone, works as airline pilot   Social Drivers of Corporate Investment Banker Strain: Low Risk  (03/12/2024)   Overall Financial Resource Strain (CARDIA)     Difficulty of Paying Living Expenses: Not hard at all  Food Insecurity: No Food Insecurity (03/12/2024)   Hunger Vital Sign    Worried About Running Out of Food in the Last Year: Never true    Ran Out of Food in the Last Year: Never true  Transportation Needs: No Transportation Needs (03/12/2024)   PRAPARE - Administrator, Civil Service (Medical): No    Lack of Transportation (Non-Medical): No  Physical Activity: Sufficiently Active (03/12/2024)   Exercise Vital Sign    Days of Exercise per Week: 7 days    Minutes of Exercise per Session: 120 min  Stress: No Stress Concern Present (03/12/2024)   [employer] of Occupational Health - Occupational Stress Questionnaire    Feeling of Stress: Only a little  Social Connections: Moderately Integrated (03/12/2024)   Social Connection and Isolation Panel    Frequency of Communication with Friends and Family: More than three times a week    Frequency of Social Gatherings with Friends and Family: More than three times a week    Attends Religious Services: More than 4 times per year    Active Member of [employer] or Organizations: Yes    Attends Banker Meetings: More than 4 times per year    Marital Status: Widowed  Intimate Partner Violence: Not At Risk (05/02/2023)   Humiliation, Afraid, Rape, and Kick questionnaire    Fear of Current or Ex-Partner: No    Emotionally Abused: No    Physically Abused: No    Sexually Abused: No    Outpatient Medications Prior to Visit  Medication Sig Dispense Refill   Calcium-Magnesium-Vitamin D  (CALCIUM MAGNESIUM PO) Take by mouth.     Cholecalciferol (VITAMIN D ) 2000 units CAPS Take 2,000 Units by mouth daily.     ELDERBERRY PO Take by mouth.     Multiple Vitamins-Minerals (ZINC PO) Take by mouth.     Omega-3 Fatty Acids (OMEGA-3 PO) Take 2 capsules by mouth daily. Omega 3 1280 mg     polyethylene glycol-electrolytes (NULYTELY) 420 g solution Use as directed 4000 mL 0   Vitamin  D, Ergocalciferol , (DRISDOL ) 1.25 MG (50000 UNIT) CAPS capsule Take 1 capsule by mouth every 7 days. 5 capsule 5   vitamin E 180 MG (400 UNITS) capsule Take 400 Units by mouth daily.     No facility-administered medications prior to visit.    Allergies  Allergen Reactions   Erythromycin Other (See Comments)    Patient states that she passed out while using this medication ? SYNCOPE ?   Other Anaphylaxis    # # # CATS # # #  Review of Systems  Constitutional:  Negative for fever and malaise/fatigue.  HENT:  Negative for congestion.   Eyes:  Negative for blurred vision.  Respiratory:  Negative for shortness of breath.   Cardiovascular:  Negative for chest pain, palpitations and leg swelling.  Gastrointestinal:  Negative for abdominal pain, blood in stool and nausea.  Genitourinary:  Negative for dysuria and frequency.  Musculoskeletal:  Negative for falls.  Skin:  Negative for rash.  Neurological:  Negative for dizziness, loss of consciousness and headaches.  Endo/Heme/Allergies:  Negative for environmental allergies.  Psychiatric/Behavioral:  Negative for depression. The patient is not nervous/anxious.        Objective:    Physical Exam Constitutional:      General: She is not in acute distress.    Appearance: Normal appearance. She is well-developed. She is not toxic-appearing.  HENT:     Head: Normocephalic and atraumatic.     Right Ear: External ear normal.     Left Ear: External ear normal.     Nose: Nose normal.  Eyes:     General:        Right eye: No discharge.        Left eye: No discharge.     Conjunctiva/sclera: Conjunctivae normal.  Neck:     Thyroid : No thyromegaly.  Cardiovascular:     Rate and Rhythm: Normal rate and regular rhythm.     Heart sounds: Normal heart sounds. No murmur heard. Pulmonary:     Effort: Pulmonary effort is normal. No respiratory distress.     Breath sounds: Normal breath sounds.  Abdominal:     General: Bowel sounds are  normal.     Palpations: Abdomen is soft.     Tenderness: There is no abdominal tenderness. There is no guarding.  Musculoskeletal:        General: Normal range of motion.     Cervical back: Neck supple.  Lymphadenopathy:     Cervical: No cervical adenopathy.  Skin:    General: Skin is warm and dry.  Neurological:     Mental Status: She is alert and oriented to person, place, and time.  Psychiatric:        Mood and Affect: Mood normal.        Behavior: Behavior normal.        Thought Content: Thought content normal.        Judgment: Judgment normal.    There were no vitals taken for this visit. Wt Readings from Last 3 Encounters:  09/19/23 170 lb 3.2 oz (77.2 kg)  08/10/23 174 lb 14.4 oz (79.3 kg)  10/28/22 164 lb 6.4 oz (74.6 kg)    Diabetic Foot Exam - Simple   No data filed    Lab Results  Component Value Date   WBC 6.6 09/19/2023   HGB 13.7 09/19/2023   HCT 40.7 09/19/2023   PLT 246.0 09/19/2023   GLUCOSE 92 09/19/2023   CHOL 225 (H) 09/19/2023   TRIG 144.0 09/19/2023   HDL 74.70 09/19/2023   LDLCALC 121 (H) 09/19/2023   ALT 17 09/19/2023   AST 17 09/19/2023   NA 139 09/19/2023   K 4.6 09/19/2023   CL 103 09/19/2023   CREATININE 0.73 09/19/2023   BUN 18 09/19/2023   CO2 28 09/19/2023   TSH 1.33 09/19/2023   INR 1.00 10/27/2016   HGBA1C 5.7 09/19/2023    Lab Results  Component Value Date   TSH 1.33 09/19/2023   Lab Results  Component Value  Date   WBC 6.6 09/19/2023   HGB 13.7 09/19/2023   HCT 40.7 09/19/2023   MCV 90.5 09/19/2023   PLT 246.0 09/19/2023   Lab Results  Component Value Date   NA 139 09/19/2023   K 4.6 09/19/2023   CHLORIDE 106 03/07/2017   CO2 28 09/19/2023   GLUCOSE 92 09/19/2023   BUN 18 09/19/2023   CREATININE 0.73 09/19/2023   BILITOT 0.4 09/19/2023   ALKPHOS 93 09/19/2023   AST 17 09/19/2023   ALT 17 09/19/2023   PROT 6.6 09/19/2023   ALBUMIN 4.4 09/19/2023   CALCIUM 9.5 09/19/2023   ANIONGAP 8 08/10/2023   EGFR  >60 03/07/2017   GFR 79.29 09/19/2023   Lab Results  Component Value Date   CHOL 225 (H) 09/19/2023   Lab Results  Component Value Date   HDL 74.70 09/19/2023   Lab Results  Component Value Date   LDLCALC 121 (H) 09/19/2023   Lab Results  Component Value Date   TRIG 144.0 09/19/2023   Lab Results  Component Value Date   CHOLHDL 3 09/19/2023   Lab Results  Component Value Date   HGBA1C 5.7 09/19/2023       Assessment & Plan:  Vitamin D  deficiency Assessment & Plan: Supplement and monitor      Assessment and Plan Assessment & Plan Cough associated with laryngeal dystonia and allergic exposure Persistent cough following laryngeal dystonia treatment, exacerbated by allergic exposure to cat dander. Symptoms include nocturnal coughing and voice changes post-treatment. No dysphagia reported. Cough likely due to a combination of treatment side effects and allergic reactions. - Use nasal saline irrigation three times daily to reduce allergen exposure. - Consider using Flonase (nasal steroid) and Zyrtec (oral antihistamine) to manage allergic symptoms. - Prescribed Hydramet (cough syrup with hydrocodone ) for nighttime use to aid sleep and reduce cough. - Advised warm tea with lemon and honey during the day for symptomatic relief.  Vitamin D  deficiency Previous labs indicated low vitamin D  levels. She has been inconsistent with vitamin D  supplementation. - Increase vitamin D  supplementation to 5000 IU daily for one month. - Will order full panel of blood work after one month to assess vitamin D  levels.  General Health Maintenance Routine health maintenance discussed, including vaccinations and screenings. Mammogram is due and scheduled. Prevnar 20 vaccine recommended for pneumococcal pneumonia prevention. - Administered Prevnar 20 vaccine today. - Ensure mammogram is completed as scheduled. - Plan for follow-up physical examination after return from travel in  June.  Recording duration: 27 minutes     Harlene Horton, MD

## 2024-03-19 ENCOUNTER — Ambulatory Visit (INDEPENDENT_AMBULATORY_CARE_PROVIDER_SITE_OTHER): Admitting: Family Medicine

## 2024-03-19 ENCOUNTER — Encounter: Payer: Self-pay | Admitting: Family Medicine

## 2024-03-19 ENCOUNTER — Other Ambulatory Visit (HOSPITAL_COMMUNITY): Payer: Self-pay

## 2024-03-19 VITALS — BP 128/78 | HR 64 | Temp 98.6°F | Resp 16 | Ht 69.0 in | Wt 165.4 lb

## 2024-03-19 DIAGNOSIS — E559 Vitamin D deficiency, unspecified: Secondary | ICD-10-CM | POA: Diagnosis not present

## 2024-03-19 DIAGNOSIS — E782 Mixed hyperlipidemia: Secondary | ICD-10-CM

## 2024-03-19 DIAGNOSIS — Z23 Encounter for immunization: Secondary | ICD-10-CM

## 2024-03-19 DIAGNOSIS — R059 Cough, unspecified: Secondary | ICD-10-CM

## 2024-03-19 DIAGNOSIS — R252 Cramp and spasm: Secondary | ICD-10-CM

## 2024-03-19 DIAGNOSIS — R739 Hyperglycemia, unspecified: Secondary | ICD-10-CM

## 2024-03-19 MED ORDER — HYDROCODONE BIT-HOMATROP MBR 5-1.5 MG/5ML PO SOLN
5.0000 mL | Freq: Two times a day (BID) | ORAL | 0 refills | Status: DC | PRN
  Filled 2024-03-19: qty 120, 12d supply, fill #0

## 2024-03-19 NOTE — Patient Instructions (Addendum)
 Go to [facility] for blood work after 12/15   Cough, Adult Coughing is a reflex that clears your throat and airways (respiratory system). It helps heal and protect your lungs. It is normal to cough from time to time. A cough that happens with other symptoms or that lasts a long time may be a sign of a condition that needs treatment. A short-term (acute) cough may only last 2-3 weeks. A long-term (chronic) cough may last 8 or more weeks. Coughing is often caused by: Diseases, such as: An infection of the respiratory system. Asthma or other heart or lung diseases. Gastroesophageal reflux. This is when acid comes back up from the stomach. Breathing in things that irritate your lungs. Allergies. Postnasal drip. This is when mucus runs down the back of your throat. Smoking. Some medicines. Follow these instructions at home: Medicines Take over-the-counter and prescription medicines only as told by your health care provider. Talk with your provider before you take cough medicine (cough suppressants). Eating and drinking Do not drink alcohol. Avoid caffeine. Drink enough fluid to keep your pee (urine) pale yellow. Lifestyle Avoid cigarette smoke. Do not use any products that contain nicotine or tobacco. These products include cigarettes, chewing tobacco, and vaping devices, such as e-cigarettes. If you need help quitting, ask your provider. Avoid things that make you cough. These may include perfumes, candles, cleaning products, or campfire smoke. General instructions  Watch for any changes to your cough. Tell your provider about them. Always cover your mouth when you cough. If the air is dry in your bedroom or home, use a cool mist vaporizer or humidifier. If your cough is worse at night, try to sleep in a semi-upright position. Rest as needed. Contact a health care provider if: You have new symptoms, or your symptoms get worse. You cough up pus. You have a fever that does not go away or  a cough that does not get better after 2-3 weeks. You cannot control your cough with medicine, and you are losing sleep. You have pain that gets worse or is not helped with medicine. You lose weight for no clear reason. You have night sweats. Get help right away if: You cough up blood. You have trouble breathing. Your heart is beating very fast. These symptoms may be an emergency. Get help right away. Call 911. Do not wait to see if the symptoms will go away. Do not drive yourself to the hospital. This information is not intended to replace advice given to you by your health care provider. Make sure you discuss any questions you have with your health care provider. Document Revised: 12/18/2021 Document Reviewed: 12/18/2021 Elsevier Patient Education  2024 [employer].

## 2024-03-21 ENCOUNTER — Encounter: Payer: Self-pay | Admitting: Family Medicine

## 2024-03-21 DIAGNOSIS — L570 Actinic keratosis: Secondary | ICD-10-CM | POA: Diagnosis not present

## 2024-03-22 DIAGNOSIS — Z1231 Encounter for screening mammogram for malignant neoplasm of breast: Secondary | ICD-10-CM | POA: Diagnosis not present

## 2024-03-22 LAB — HM MAMMOGRAPHY

## 2024-04-16 ENCOUNTER — Other Ambulatory Visit (INDEPENDENT_AMBULATORY_CARE_PROVIDER_SITE_OTHER)

## 2024-04-16 ENCOUNTER — Other Ambulatory Visit (HOSPITAL_COMMUNITY): Payer: Self-pay

## 2024-04-16 ENCOUNTER — Ambulatory Visit: Payer: Self-pay | Admitting: Family Medicine

## 2024-04-16 DIAGNOSIS — R059 Cough, unspecified: Secondary | ICD-10-CM

## 2024-04-16 DIAGNOSIS — R252 Cramp and spasm: Secondary | ICD-10-CM | POA: Diagnosis not present

## 2024-04-16 DIAGNOSIS — R739 Hyperglycemia, unspecified: Secondary | ICD-10-CM

## 2024-04-16 DIAGNOSIS — E559 Vitamin D deficiency, unspecified: Secondary | ICD-10-CM | POA: Diagnosis not present

## 2024-04-16 DIAGNOSIS — E782 Mixed hyperlipidemia: Secondary | ICD-10-CM | POA: Diagnosis not present

## 2024-04-16 LAB — LIPID PANEL
Cholesterol: 220 mg/dL — ABNORMAL HIGH (ref 0–200)
HDL: 74.9 mg/dL (ref >39.00)
LDL Cholesterol: 128 mg/dL — ABNORMAL HIGH (ref 0–99)
NonHDL: 144.81
Total CHOL/HDL Ratio: 3
Triglycerides: 82 mg/dL (ref 0.0–149.0)
VLDL: 16.4 mg/dL (ref 0.0–40.0)

## 2024-04-16 LAB — COMPREHENSIVE METABOLIC PANEL WITH GFR
ALT: 15 U/L (ref 0–35)
AST: 13 U/L (ref 0–37)
Albumin: 4.3 g/dL (ref 3.5–5.2)
Alkaline Phosphatase: 90 U/L (ref 39–117)
BUN: 17 mg/dL (ref 6–23)
CO2: 28 meq/L (ref 19–32)
Calcium: 9.6 mg/dL (ref 8.4–10.5)
Chloride: 103 meq/L (ref 96–112)
Creatinine, Ser: 0.73 mg/dL (ref 0.40–1.20)
GFR: 78.97 mL/min (ref >60.00)
Glucose, Bld: 89 mg/dL (ref 70–99)
Potassium: 4.2 meq/L (ref 3.5–5.1)
Sodium: 139 meq/L (ref 135–145)
Total Bilirubin: 0.7 mg/dL (ref 0.2–1.2)
Total Protein: 6.4 g/dL (ref 6.0–8.3)

## 2024-04-16 LAB — CBC WITH DIFFERENTIAL/PLATELET
Basophils Absolute: 0 K/uL (ref 0.0–0.1)
Basophils Relative: 0.5 % (ref 0.0–3.0)
Eosinophils Absolute: 0.2 K/uL (ref 0.0–0.7)
Eosinophils Relative: 4.7 % (ref 0.0–5.0)
HCT: 41.7 % (ref 36.0–46.0)
Hemoglobin: 14.1 g/dL (ref 12.0–15.0)
Lymphocytes Relative: 36.1 % (ref 12.0–46.0)
Lymphs Abs: 1.8 K/uL (ref 0.7–4.0)
MCHC: 33.8 g/dL (ref 30.0–36.0)
MCV: 90.5 fl (ref 78.0–100.0)
Monocytes Absolute: 0.6 K/uL (ref 0.1–1.0)
Monocytes Relative: 10.8 % (ref 3.0–12.0)
Neutro Abs: 2.4 K/uL (ref 1.4–7.7)
Neutrophils Relative %: 47.9 % (ref 43.0–77.0)
Platelets: 273 K/uL (ref 150.0–400.0)
RBC: 4.61 Mil/uL (ref 3.87–5.11)
RDW: 13.7 % (ref 11.5–15.5)
WBC: 5.1 K/uL (ref 4.0–10.5)

## 2024-04-16 LAB — MAGNESIUM: Magnesium: 2.1 mg/dL (ref 1.5–2.5)

## 2024-04-16 LAB — TSH: TSH: 2.69 u[IU]/mL (ref 0.35–5.50)

## 2024-04-16 LAB — VITAMIN D 25 HYDROXY (VIT D DEFICIENCY, FRACTURES): VITD: 23.36 ng/mL — ABNORMAL LOW (ref 30.00–100.00)

## 2024-04-16 LAB — HEMOGLOBIN A1C: Hgb A1c MFr Bld: 5.4 % (ref 4.6–6.5)

## 2024-04-16 MED ORDER — VITAMIN D (ERGOCALCIFEROL) 1.25 MG (50000 UNIT) PO CAPS
50000.0000 [IU] | ORAL_CAPSULE | ORAL | 0 refills | Status: AC
  Filled 2024-04-16: qty 12, 84d supply, fill #0

## 2024-05-02 ENCOUNTER — Ambulatory Visit (INDEPENDENT_AMBULATORY_CARE_PROVIDER_SITE_OTHER): Payer: Medicare Other | Admitting: *Deleted

## 2024-05-02 VITALS — Ht 69.0 in | Wt 165.0 lb

## 2024-05-02 DIAGNOSIS — Z0000 Encounter for general adult medical examination without abnormal findings: Secondary | ICD-10-CM | POA: Diagnosis not present

## 2024-05-02 NOTE — Patient Instructions (Addendum)
 Lindsay Harrison,  Thank you for taking the time for your Medicare Wellness Visit. I appreciate your continued commitment to your health goals. Please review the care plan we discussed, and feel free to reach out if I can assist you further.  Please note that Annual Wellness Visits do not include a physical exam. Some assessments may be limited, especially if the visit was conducted virtually. If needed, we may recommend an in-person follow-up with your provider.  GOAL: continue being socially and physically active   Ongoing Care Seeing your primary care provider every 3 to 6 months helps us  monitor your health and provide consistent, personalized care.   Dr Domenica: 12/03/24 1:20pm, physical Medicare AWV: 05/07/2025 9:40am, telephone  Recommended Screenings:  Health Maintenance  Topic Date Due   Medicare Annual Wellness Visit  05/01/2024   COVID-19 Vaccine (12 - Pfizer risk 2025-26 season) 08/09/2024   DTaP/Tdap/Td vaccine (3 - Td or Tdap) 02/04/2025   Breast Cancer Screening  03/22/2025   Osteoporosis screening with Bone Density Scan  03/12/2026   Colon Cancer Screening  11/10/2026   Pneumococcal Vaccine for age over 90  Completed   Flu Shot  Completed   Hepatitis C Screening  Completed   Zoster (Shingles) Vaccine  Completed   Meningitis B Vaccine  Aged Out   Hepatitis B Vaccine  Discontinued       04/30/2024    5:05 PM  Advanced Directives  Does Patient Have a Medical Advance Directive? Yes  Type of Estate Agent of [place];Living will  Does patient want to make changes to medical advance directive? No - Patient declined  Copy of Healthcare Power of Attorney in Chart? Yes - validated most recent copy scanned in chart (See row information)    Vision: Annual vision screenings are recommended for early detection of glaucoma, cataracts, and diabetic retinopathy. These exams can also reveal signs of chronic conditions such as diabetes and high blood  pressure.  Dental: Annual dental screenings help detect early signs of oral cancer, gum disease, and other conditions linked to overall health, including heart disease and diabetes.  Please see the attached documents for additional preventive care recommendations.

## 2024-05-02 NOTE — Progress Notes (Signed)
 "  Please attest this visit in the absence of patient primary care provider.    Chief Complaint  Patient presents with   Medicare Wellness     Subjective:   Lindsay Harrison is a 78 y.o. female who presents for a Medicare Annual Wellness Visit.  Visit info / Clinical Intake: Medicare Wellness Visit Type:: Subsequent Annual Wellness Visit Persons participating in visit and providing information:: patient Medicare Wellness Visit Mode:: Telephone If telephone:: video declined Since this visit was completed virtually, some vitals may be partially provided or unavailable. Missing vitals are due to the limitations of the virtual format.: Unable to obtain vitals - no equipment If Telephone or Video please confirm:: I connected with patient using audio/video enable telemedicine. I verified patient identity with two identifiers, discussed telehealth limitations, and patient agreed to proceed. Patient Location:: home Provider Location:: office Interpreter Needed?: No Pre-visit prep was completed: yes AWV questionnaire completed by patient prior to visit?: yes Date:: 04/30/24 Living arrangements:: (!) (Patient-Rptd) lives alone Patient's Overall Health Status Rating: (Patient-Rptd) excellent Typical amount of pain: (Patient-Rptd) none Does pain affect daily life?: (Patient-Rptd) no Are you currently prescribed opioids?: no  Dietary Habits and Nutritional Risks How many meals a day?: (Patient-Rptd) 3 Eats fruit and vegetables daily?: (Patient-Rptd) yes Most meals are obtained by: (Patient-Rptd) preparing own meals; eating out In the last 2 weeks, have you had any of the following?: none Diabetic:: no  Functional Status Activities of Daily Living (to include ambulation/medication): (Patient-Rptd) Independent Ambulation: (Patient-Rptd) Independent Medication Administration: (Patient-Rptd) Independent Home Management (perform basic housework or laundry): (Patient-Rptd) Independent Manage  your own finances?: (Patient-Rptd) yes Primary transportation is: (Patient-Rptd) driving Concerns about vision?: no *vision screening is required for WT[facility]* (up to date with Arlyss King) Concerns about hearing?: no  Fall Screening Falls in the past year?: (Patient-Rptd) 0 Number of falls in past year: 0 Was there an injury with Fall?: 0 Fall Risk Category Calculator: 0 Patient Fall Risk Level: Low Fall Risk  Fall Risk Patient at Risk for Falls Due to: Orthopedic patient Fall risk Follow up: Falls evaluation completed  Home and Transportation Safety: All rugs have non-skid backing?: (Patient-Rptd) yes All stairs or steps have railings?: (Patient-Rptd) yes Grab bars in the bathtub or shower?: (Patient-Rptd) yes Have non-skid surface in bathtub or shower?: (Patient-Rptd) yes Good home lighting?: (Patient-Rptd) yes Regular seat belt use?: (Patient-Rptd) yes Hospital stays in the last year:: (Patient-Rptd) no  Cognitive Assessment Difficulty concentrating, remembering, or making decisions? : (Patient-Rptd) no Will 6CIT or Mini Cog be Completed: yes What year is it?: 0 points What month is it?: 0 points Give patient an address phrase to remember (5 components): [address], [place] About what time is it?: 0 points Count backwards from 20 to 1: 0 points Say the months of the year in reverse: 0 points Repeat the address phrase from earlier: 0 points 6 CIT Score: 0 points  Advance Directives (For Healthcare) Does Patient Have a Medical Advance Directive?: Yes Does patient want to make changes to medical advance directive?: No - Patient declined Type of Advance Directive: Healthcare Power of [place]; Living will Copy of Healthcare Power of Attorney in Chart?: Yes - validated most recent copy scanned in chart (See row information) Copy of Living Will in Chart?: Yes - validated most recent copy scanned in chart (See row information)  Reviewed/Updated  Reviewed/Updated: Reviewed  All (Medical, Surgical, Family, Medications, Allergies, Care Teams, Patient Goals)    Allergies (verified) Erythromycin and Other  Current Medications (verified) Outpatient Encounter Medications as of 05/02/2024  Medication Sig   Calcium-Magnesium-Vitamin D  (CALCIUM MAGNESIUM PO) Take by mouth.   Cholecalciferol (VITAMIN D ) 2000 units CAPS Take 2,000 Units by mouth daily.   ELDERBERRY PO Take by mouth.   Multiple Vitamins-Minerals (ZINC PO) Take by mouth.   Omega-3 Fatty Acids (OMEGA-3 PO) Take 2 capsules by mouth daily. Omega 3 1280 mg   polyethylene glycol-electrolytes (NULYTELY) 420 g solution Use as directed   Vitamin D , Ergocalciferol , (DRISDOL ) 1.25 MG (50000 UNIT) CAPS capsule Take 1 capsule (50,000 Units total) by mouth every 7 (seven) days.   vitamin E 180 MG (400 UNITS) capsule Take 400 Units by mouth daily.   [DISCONTINUED] HYDROcodone  bit-homatropine (HYDROMET) 5-1.5 MG/5ML syrup Take 5 mLs by mouth 2 (two) times daily between meals as needed for cough.   No facility-administered encounter medications on file as of 05/02/2024.    History: Past Medical History:  Diagnosis Date   Anemia    h/o iron deficiency secondary to heavy menses   Arthritis    left hand index finger   Diffuse large B cell lymphoma (HCC) dx'd 04/2016   Grief reaction 01/27/2016   H/O measles    as a child   History of chicken pox    as a child   History of PCOS    Hyperlipidemia, mixed 01/27/2016   Lymphadenopathy    Peripheral neuropathy 01/31/2017   Preventative health care 01/27/2016   Vitamin D  deficiency    Wears glasses    Past Surgical History:  Procedure Laterality Date   COLONOSCOPY     IR GENERIC HISTORICAL  05/21/2016   IR FLUORO GUIDE PORT INSERTION RIGHT 05/21/2016 Toribio Faes, MD [facility]   IR GENERIC HISTORICAL  05/21/2016   IR US  GUIDE VASC ACCESS RIGHT 05/21/2016 Toribio Faes, MD [facility]   IR REMOVAL TUN ACCESS W/ PORT W/O FL MOD SED  10/27/2016   SKIN BIOPSY  Left    face   THYROGLOSSAL DUCT CYST N/A 04/16/2016   Procedure: THYROGLOSSAL DUCT CYST excision;  Surgeon: Alm Bouche, MD;  Location: [facility];  Service: ENT;  Laterality: N/A;   Family History  Problem Relation Age of Onset   Arthritis Mother        rheumatoid   Heart disease Father        CHF   COPD Sister        Emphysema, h/o cigarettes   Other Sister        pituatary tumor   Arthritis Sister    Obesity Sister    Cancer Paternal Grandfather        cancer   Stroke Maternal Aunt    Social History   Occupational History   Occupation: Psychotherapist  Tobacco Use   Smoking status: Never   Smokeless tobacco: Never  Vaping Use   Vaping status: Never Used  Substance and Sexual Activity   Alcohol use: Yes    Alcohol/week: 3.0 standard drinks of alcohol    Types: 3 Glasses of wine per week    Comment: Minimal   Drug use: No   Sexual activity: Never    Partners: Male    Birth control/protection: None   Tobacco Counseling Counseling given: Not Answered  SDOH Screenings   Food Insecurity: No Food Insecurity (05/02/2024)  Housing: Low Risk (05/02/2024)  Transportation Needs: No Transportation Needs (05/02/2024)  Utilities: Not At Risk (05/02/2024)  Alcohol Screen: Low Risk (03/12/2024)  Depression (PHQ2-9): Low Risk (05/02/2024)  Financial Resource Strain: Low Risk (03/12/2024)  Physical Activity: Sufficiently Active (05/02/2024)  Social Connections: Moderately Integrated (05/02/2024)  Stress: No Stress Concern Present (05/02/2024)  Tobacco Use: Low Risk (05/02/2024)  Health Literacy: Adequate Health Literacy (05/02/2023)   See flowsheets for full screening details  Depression Screen PHQ 2 & 9 Depression Scale- Over the past 2 weeks, how often have you been bothered by any of the following problems? Little interest or pleasure in doing things: 0 Feeling down, depressed, or hopeless (PHQ Adolescent also includes...irritable): 0 PHQ-2 Total Score: 0 Trouble  falling or staying asleep, or sleeping too much: 0 Feeling tired or having little energy: 0 Poor appetite or overeating (PHQ Adolescent also includes...weight loss): 0 Feeling bad about yourself - or that you are a failure or have let yourself or your family down: 0 Trouble concentrating on things, such as reading the newspaper or watching television (PHQ Adolescent also includes...like school work): 0 Moving or speaking so slowly that other people could have noticed. Or the opposite - being so fidgety or restless that you have been moving around a lot more than usual: 0 Thoughts that you would be better off dead, or of hurting yourself in some way: 0 PHQ-9 Total Score: 0 If you checked off any problems, how difficult have these problems made it for you to do your work, take care of things at home, or get along with other people?: Not difficult at all  Depression Treatment Depression Interventions/Treatment : [id number] Score <4 Follow-up Not Indicated     Goals Addressed               This Visit's Progress     continue being socially and physically active (pt-stated)   On track            Objective:    Today's Vitals   05/02/24 0950  Weight: 165 lb (74.8 kg)  Height: 5' 9 (1.753 m)   Body mass index is 24.37 kg/m.  Hearing/Vision screen No results found. Immunizations and Health Maintenance Health Maintenance  Topic Date Due   COVID-19 Vaccine (12 - Pfizer risk 2025-26 season) 08/09/2024   DTaP/Tdap/Td (3 - Td or Tdap) 02/04/2025   Mammogram  03/22/2025   Medicare Annual Wellness (AWV)  05/02/2025   Bone Density Scan  03/12/2026   Colonoscopy  11/10/2026   Pneumococcal Vaccine: 50+ Years  Completed   Influenza Vaccine  Completed   Hepatitis C Screening  Completed   Zoster Vaccines- Shingrix  Completed   Meningococcal B Vaccine  Aged Out   Hepatitis B Vaccines 19-59 Average Risk  Discontinued        Assessment/Plan:  This is a routine wellness examination for  Lindsay Harrison.  Patient Care Team: Domenica Harlene LABOR, MD as PCP - General (Family Medicine) Mammography, [facility] (Diagnostic Radiology) Charmayne Molly, MD as Consulting Physician (Ophthalmology)  I have personally reviewed and noted the following in the patients chart:   Medical and social history Use of alcohol, tobacco or illicit drugs  Current medications and supplements including opioid prescriptions. Functional ability and status Nutritional status Physical activity Advanced directives List of other physicians Hospitalizations, surgeries, and ER visits in previous 12 months Vitals Screenings to include cognitive, depression, and falls Referrals and appointments  No orders of the defined types were placed in this encounter.  In addition, I have reviewed and discussed with patient certain preventive protocols, quality metrics, and best practice recommendations. A written personalized care plan for preventive services as well as general  preventive health recommendations were provided to patient.   Lolita Libra, CMA   05/02/2024   Return in 1 year (on 05/02/2025).  After Visit Summary: (MyChart) Due to this being a telephonic visit, the after visit summary with patients personalized plan was offered to patient via MyChart   Nurse Notes: HM Addressed: All HM up to date  "

## 2024-08-15 ENCOUNTER — Other Ambulatory Visit

## 2024-08-15 ENCOUNTER — Ambulatory Visit: Admitting: Hematology

## 2024-10-29 ENCOUNTER — Ambulatory Visit: Admitting: Family Medicine

## 2024-12-03 ENCOUNTER — Encounter: Admitting: Family Medicine

## 2025-02-28 ENCOUNTER — Ambulatory Visit: Admitting: Family Medicine

## 2025-04-29 ENCOUNTER — Ambulatory Visit: Admitting: Family Medicine

## 2025-05-07 ENCOUNTER — Ambulatory Visit
